# Patient Record
Sex: Male | Born: 1955 | Race: Black or African American | Hispanic: No | Smoking: Never smoker
Health system: Southern US, Community
[De-identification: ages and names within clinical notes are randomized; demographics above are authoritative.]

## PROBLEM LIST (undated history)

## (undated) ENCOUNTER — Emergency Department (HOSPITAL_COMMUNITY): Admission: EM | Payer: Self-pay | Source: Home / Self Care

## (undated) DIAGNOSIS — I10 Essential (primary) hypertension: Secondary | ICD-10-CM

## (undated) DIAGNOSIS — E119 Type 2 diabetes mellitus without complications: Secondary | ICD-10-CM

## (undated) DIAGNOSIS — I639 Cerebral infarction, unspecified: Secondary | ICD-10-CM

## (undated) HISTORY — PX: KNEE SURGERY: SHX244

## (undated) HISTORY — PX: EYE SURGERY: SHX253

## (undated) HISTORY — PX: TONSILLECTOMY: SUR1361

## (undated) HISTORY — PX: HAND SURGERY: SHX662

## (undated) HISTORY — PX: APPENDECTOMY: SHX54

---

## 1997-05-11 ENCOUNTER — Emergency Department (HOSPITAL_COMMUNITY): Admission: EM | Admit: 1997-05-11 | Discharge: 1997-05-11 | Payer: Self-pay | Admitting: Emergency Medicine

## 1997-07-25 ENCOUNTER — Emergency Department (HOSPITAL_COMMUNITY): Admission: EM | Admit: 1997-07-25 | Discharge: 1997-07-25 | Payer: Self-pay | Admitting: Emergency Medicine

## 1998-01-08 ENCOUNTER — Emergency Department (HOSPITAL_COMMUNITY): Admission: EM | Admit: 1998-01-08 | Discharge: 1998-01-08 | Payer: Self-pay | Admitting: Emergency Medicine

## 1998-03-02 ENCOUNTER — Emergency Department (HOSPITAL_COMMUNITY): Admission: EM | Admit: 1998-03-02 | Discharge: 1998-03-02 | Payer: Self-pay

## 1998-03-05 ENCOUNTER — Emergency Department (HOSPITAL_COMMUNITY): Admission: EM | Admit: 1998-03-05 | Discharge: 1998-03-05 | Payer: Self-pay | Admitting: Emergency Medicine

## 1998-03-15 ENCOUNTER — Emergency Department (HOSPITAL_COMMUNITY): Admission: EM | Admit: 1998-03-15 | Discharge: 1998-03-15 | Payer: Self-pay | Admitting: Emergency Medicine

## 1998-07-19 ENCOUNTER — Emergency Department (HOSPITAL_COMMUNITY): Admission: EM | Admit: 1998-07-19 | Discharge: 1998-07-19 | Payer: Self-pay | Admitting: Emergency Medicine

## 1998-08-04 ENCOUNTER — Emergency Department (HOSPITAL_COMMUNITY): Admission: EM | Admit: 1998-08-04 | Discharge: 1998-08-04 | Payer: Self-pay | Admitting: Emergency Medicine

## 1998-09-07 ENCOUNTER — Emergency Department (HOSPITAL_COMMUNITY): Admission: EM | Admit: 1998-09-07 | Discharge: 1998-09-07 | Payer: Self-pay | Admitting: Emergency Medicine

## 1998-09-09 ENCOUNTER — Emergency Department (HOSPITAL_COMMUNITY): Admission: EM | Admit: 1998-09-09 | Discharge: 1998-09-09 | Payer: Self-pay

## 1999-03-24 ENCOUNTER — Encounter: Payer: Self-pay | Admitting: Emergency Medicine

## 1999-03-24 ENCOUNTER — Emergency Department (HOSPITAL_COMMUNITY): Admission: EM | Admit: 1999-03-24 | Discharge: 1999-03-24 | Payer: Self-pay | Admitting: Emergency Medicine

## 1999-03-31 ENCOUNTER — Emergency Department (HOSPITAL_COMMUNITY): Admission: EM | Admit: 1999-03-31 | Discharge: 1999-03-31 | Payer: Self-pay | Admitting: Emergency Medicine

## 1999-05-15 ENCOUNTER — Emergency Department (HOSPITAL_COMMUNITY): Admission: EM | Admit: 1999-05-15 | Discharge: 1999-05-15 | Payer: Self-pay | Admitting: *Deleted

## 1999-09-11 ENCOUNTER — Emergency Department (HOSPITAL_COMMUNITY): Admission: EM | Admit: 1999-09-11 | Discharge: 1999-09-11 | Payer: Self-pay | Admitting: Emergency Medicine

## 1999-09-11 ENCOUNTER — Encounter: Payer: Self-pay | Admitting: Emergency Medicine

## 1999-09-16 ENCOUNTER — Emergency Department (HOSPITAL_COMMUNITY): Admission: EM | Admit: 1999-09-16 | Discharge: 1999-09-16 | Payer: Self-pay | Admitting: *Deleted

## 2000-06-25 ENCOUNTER — Emergency Department (HOSPITAL_COMMUNITY): Admission: EM | Admit: 2000-06-25 | Discharge: 2000-06-26 | Payer: Self-pay | Admitting: Emergency Medicine

## 2000-06-26 ENCOUNTER — Encounter: Payer: Self-pay | Admitting: Emergency Medicine

## 2000-08-27 ENCOUNTER — Encounter: Payer: Self-pay | Admitting: Emergency Medicine

## 2000-08-27 ENCOUNTER — Inpatient Hospital Stay (HOSPITAL_COMMUNITY): Admission: EM | Admit: 2000-08-27 | Discharge: 2000-08-31 | Payer: Self-pay | Admitting: Internal Medicine

## 2000-10-01 ENCOUNTER — Emergency Department (HOSPITAL_COMMUNITY): Admission: EM | Admit: 2000-10-01 | Discharge: 2000-10-01 | Payer: Self-pay | Admitting: Emergency Medicine

## 2000-10-01 ENCOUNTER — Encounter: Payer: Self-pay | Admitting: Emergency Medicine

## 2001-02-09 ENCOUNTER — Emergency Department (HOSPITAL_COMMUNITY): Admission: EM | Admit: 2001-02-09 | Discharge: 2001-02-09 | Payer: Self-pay

## 2001-05-28 ENCOUNTER — Emergency Department (HOSPITAL_COMMUNITY): Admission: EM | Admit: 2001-05-28 | Discharge: 2001-05-28 | Payer: Self-pay | Admitting: Emergency Medicine

## 2001-07-28 ENCOUNTER — Emergency Department (HOSPITAL_COMMUNITY): Admission: EM | Admit: 2001-07-28 | Discharge: 2001-07-28 | Payer: Self-pay | Admitting: Emergency Medicine

## 2002-06-16 ENCOUNTER — Emergency Department (HOSPITAL_COMMUNITY): Admission: EM | Admit: 2002-06-16 | Discharge: 2002-06-16 | Payer: Self-pay | Admitting: Emergency Medicine

## 2002-09-15 ENCOUNTER — Emergency Department (HOSPITAL_COMMUNITY): Admission: EM | Admit: 2002-09-15 | Discharge: 2002-09-15 | Payer: Self-pay | Admitting: Emergency Medicine

## 2003-04-25 ENCOUNTER — Emergency Department (HOSPITAL_COMMUNITY): Admission: EM | Admit: 2003-04-25 | Discharge: 2003-04-25 | Payer: Self-pay | Admitting: Emergency Medicine

## 2004-08-10 ENCOUNTER — Emergency Department (HOSPITAL_COMMUNITY): Admission: EM | Admit: 2004-08-10 | Discharge: 2004-08-10 | Payer: Self-pay | Admitting: Emergency Medicine

## 2004-09-25 ENCOUNTER — Emergency Department (HOSPITAL_COMMUNITY): Admission: EM | Admit: 2004-09-25 | Discharge: 2004-09-25 | Payer: Self-pay | Admitting: Emergency Medicine

## 2004-09-28 ENCOUNTER — Emergency Department (HOSPITAL_COMMUNITY): Admission: EM | Admit: 2004-09-28 | Discharge: 2004-09-28 | Payer: Self-pay | Admitting: Emergency Medicine

## 2004-10-08 ENCOUNTER — Emergency Department (HOSPITAL_COMMUNITY): Admission: EM | Admit: 2004-10-08 | Discharge: 2004-10-08 | Payer: Self-pay | Admitting: Emergency Medicine

## 2004-10-19 ENCOUNTER — Ambulatory Visit (HOSPITAL_COMMUNITY): Admission: RE | Admit: 2004-10-19 | Discharge: 2004-10-19 | Payer: Self-pay | Admitting: Orthopedic Surgery

## 2004-10-31 ENCOUNTER — Ambulatory Visit (HOSPITAL_BASED_OUTPATIENT_CLINIC_OR_DEPARTMENT_OTHER): Admission: RE | Admit: 2004-10-31 | Discharge: 2004-10-31 | Payer: Self-pay | Admitting: Orthopedic Surgery

## 2004-10-31 ENCOUNTER — Ambulatory Visit (HOSPITAL_COMMUNITY): Admission: RE | Admit: 2004-10-31 | Discharge: 2004-10-31 | Payer: Self-pay | Admitting: Orthopedic Surgery

## 2004-12-15 ENCOUNTER — Emergency Department (HOSPITAL_COMMUNITY): Admission: EM | Admit: 2004-12-15 | Discharge: 2004-12-15 | Payer: Self-pay | Admitting: Emergency Medicine

## 2005-06-02 ENCOUNTER — Emergency Department (HOSPITAL_COMMUNITY): Admission: EM | Admit: 2005-06-02 | Discharge: 2005-06-02 | Payer: Self-pay | Admitting: Emergency Medicine

## 2005-09-24 ENCOUNTER — Emergency Department (HOSPITAL_COMMUNITY): Admission: EM | Admit: 2005-09-24 | Discharge: 2005-09-24 | Payer: Self-pay | Admitting: Emergency Medicine

## 2007-11-06 ENCOUNTER — Emergency Department (HOSPITAL_COMMUNITY): Admission: EM | Admit: 2007-11-06 | Discharge: 2007-11-06 | Payer: Self-pay | Admitting: Emergency Medicine

## 2008-08-30 ENCOUNTER — Emergency Department (HOSPITAL_COMMUNITY): Admission: EM | Admit: 2008-08-30 | Discharge: 2008-08-30 | Payer: Self-pay | Admitting: Emergency Medicine

## 2008-09-13 ENCOUNTER — Emergency Department (HOSPITAL_COMMUNITY): Admission: EM | Admit: 2008-09-13 | Discharge: 2008-09-13 | Payer: Self-pay | Admitting: Emergency Medicine

## 2008-10-11 ENCOUNTER — Emergency Department (HOSPITAL_COMMUNITY): Admission: EM | Admit: 2008-10-11 | Discharge: 2008-10-11 | Payer: Self-pay | Admitting: Emergency Medicine

## 2009-07-11 ENCOUNTER — Emergency Department (HOSPITAL_COMMUNITY): Admission: EM | Admit: 2009-07-11 | Discharge: 2009-07-11 | Payer: Self-pay | Admitting: Emergency Medicine

## 2009-09-24 ENCOUNTER — Emergency Department (HOSPITAL_COMMUNITY): Admission: EM | Admit: 2009-09-24 | Discharge: 2009-09-24 | Payer: Self-pay | Admitting: Emergency Medicine

## 2009-12-16 ENCOUNTER — Emergency Department (HOSPITAL_COMMUNITY): Admission: EM | Admit: 2009-12-16 | Discharge: 2009-12-16 | Payer: Self-pay | Admitting: Emergency Medicine

## 2010-04-06 LAB — URINALYSIS, ROUTINE W REFLEX MICROSCOPIC
Bilirubin Urine: NEGATIVE
Glucose, UA: NEGATIVE mg/dL
Hgb urine dipstick: NEGATIVE
Ketones, ur: NEGATIVE mg/dL
Nitrite: NEGATIVE
Protein, ur: NEGATIVE mg/dL
Specific Gravity, Urine: 1.024 (ref 1.005–1.030)
Urobilinogen, UA: 0.2 mg/dL (ref 0.0–1.0)
pH: 6 (ref 5.0–8.0)

## 2010-04-07 LAB — URINE MICROSCOPIC-ADD ON

## 2010-04-07 LAB — URINALYSIS, ROUTINE W REFLEX MICROSCOPIC
Bilirubin Urine: NEGATIVE
Glucose, UA: NEGATIVE mg/dL
Hgb urine dipstick: NEGATIVE
Ketones, ur: NEGATIVE mg/dL
Nitrite: NEGATIVE
Protein, ur: NEGATIVE mg/dL
Specific Gravity, Urine: 1.02 (ref 1.005–1.030)
Urobilinogen, UA: 0.2 mg/dL (ref 0.0–1.0)
pH: 6.5 (ref 5.0–8.0)

## 2010-04-07 LAB — GC/CHLAMYDIA PROBE AMP, GENITAL
Chlamydia, DNA Probe: NEGATIVE
GC Probe Amp, Genital: NEGATIVE

## 2010-04-07 LAB — RPR: RPR Ser Ql: NONREACTIVE

## 2010-04-07 LAB — RAPID STREP SCREEN (MED CTR MEBANE ONLY): Streptococcus, Group A Screen (Direct): NEGATIVE

## 2010-04-11 LAB — DIFFERENTIAL
Basophils Absolute: 0 10*3/uL (ref 0.0–0.1)
Basophils Relative: 0 % (ref 0–1)
Eosinophils Absolute: 0.3 10*3/uL (ref 0.0–0.7)
Eosinophils Relative: 4 % (ref 0–5)
Lymphocytes Relative: 38 % (ref 12–46)
Lymphs Abs: 2.7 10*3/uL (ref 0.7–4.0)
Monocytes Absolute: 0.4 10*3/uL (ref 0.1–1.0)
Monocytes Relative: 5 % (ref 3–12)
Neutro Abs: 3.7 10*3/uL (ref 1.7–7.7)
Neutrophils Relative %: 52 % (ref 43–77)

## 2010-04-11 LAB — CBC
HCT: 37.9 % — ABNORMAL LOW (ref 39.0–52.0)
Hemoglobin: 12.5 g/dL — ABNORMAL LOW (ref 13.0–17.0)
MCHC: 32.8 g/dL (ref 30.0–36.0)
MCV: 88.8 fL (ref 78.0–100.0)
Platelets: 165 10*3/uL (ref 150–400)
RBC: 4.27 MIL/uL (ref 4.22–5.81)
RDW: 14.6 % (ref 11.5–15.5)
WBC: 7.1 10*3/uL (ref 4.0–10.5)

## 2010-04-11 LAB — URINALYSIS, ROUTINE W REFLEX MICROSCOPIC
Bilirubin Urine: NEGATIVE
Glucose, UA: NEGATIVE mg/dL
Hgb urine dipstick: NEGATIVE
Ketones, ur: NEGATIVE mg/dL
Nitrite: NEGATIVE
Protein, ur: NEGATIVE mg/dL
Specific Gravity, Urine: 1.015 (ref 1.005–1.030)
Urobilinogen, UA: 0.2 mg/dL (ref 0.0–1.0)
pH: 6 (ref 5.0–8.0)

## 2010-04-11 LAB — POCT I-STAT, CHEM 8
BUN: 13 mg/dL (ref 6–23)
Calcium, Ion: 1.22 mmol/L (ref 1.12–1.32)
Chloride: 108 mEq/L (ref 96–112)
Creatinine, Ser: 1.2 mg/dL (ref 0.4–1.5)
Glucose, Bld: 104 mg/dL — ABNORMAL HIGH (ref 70–99)
HCT: 40 % (ref 39.0–52.0)
Hemoglobin: 13.6 g/dL (ref 13.0–17.0)
Potassium: 3.7 mEq/L (ref 3.5–5.1)
Sodium: 142 mEq/L (ref 135–145)
TCO2: 25 mmol/L (ref 0–100)

## 2010-04-11 LAB — HEMOCCULT GUIAC POC 1CARD (OFFICE): Fecal Occult Bld: NEGATIVE

## 2010-04-29 LAB — URINALYSIS, ROUTINE W REFLEX MICROSCOPIC
Bilirubin Urine: NEGATIVE
Glucose, UA: NEGATIVE mg/dL
Hgb urine dipstick: NEGATIVE
Ketones, ur: NEGATIVE mg/dL
Nitrite: NEGATIVE
Protein, ur: NEGATIVE mg/dL
Specific Gravity, Urine: 1.029 (ref 1.005–1.030)
Urobilinogen, UA: 1 mg/dL (ref 0.0–1.0)
pH: 5.5 (ref 5.0–8.0)

## 2010-04-29 LAB — URINE MICROSCOPIC-ADD ON

## 2010-04-29 LAB — GC/CHLAMYDIA PROBE AMP, GENITAL
Chlamydia, DNA Probe: NEGATIVE
GC Probe Amp, Genital: NEGATIVE

## 2010-04-29 LAB — URINE CULTURE: Colony Count: 10000

## 2010-04-30 LAB — DIFFERENTIAL
Basophils Absolute: 0 10*3/uL (ref 0.0–0.1)
Basophils Relative: 0 % (ref 0–1)
Eosinophils Absolute: 0.2 10*3/uL (ref 0.0–0.7)
Eosinophils Relative: 4 % (ref 0–5)
Lymphocytes Relative: 34 % (ref 12–46)
Lymphs Abs: 2.2 10*3/uL (ref 0.7–4.0)
Monocytes Absolute: 0.3 10*3/uL (ref 0.1–1.0)
Monocytes Relative: 5 % (ref 3–12)
Neutro Abs: 3.7 10*3/uL (ref 1.7–7.7)
Neutrophils Relative %: 57 % (ref 43–77)

## 2010-04-30 LAB — POCT I-STAT, CHEM 8
BUN: 17 mg/dL (ref 6–23)
Calcium, Ion: 1.24 mmol/L (ref 1.12–1.32)
Chloride: 109 mEq/L (ref 96–112)
Creatinine, Ser: 1.2 mg/dL (ref 0.4–1.5)
Glucose, Bld: 97 mg/dL (ref 70–99)
HCT: 42 % (ref 39.0–52.0)
Hemoglobin: 14.3 g/dL (ref 13.0–17.0)
Potassium: 4.1 mEq/L (ref 3.5–5.1)
Sodium: 140 mEq/L (ref 135–145)
TCO2: 24 mmol/L (ref 0–100)

## 2010-04-30 LAB — URINALYSIS, ROUTINE W REFLEX MICROSCOPIC
Bilirubin Urine: NEGATIVE
Glucose, UA: NEGATIVE mg/dL
Hgb urine dipstick: NEGATIVE
Ketones, ur: NEGATIVE mg/dL
Nitrite: NEGATIVE
Protein, ur: NEGATIVE mg/dL
Specific Gravity, Urine: 1.025 (ref 1.005–1.030)
Urobilinogen, UA: 0.2 mg/dL (ref 0.0–1.0)
pH: 5.5 (ref 5.0–8.0)

## 2010-04-30 LAB — GLUCOSE, CAPILLARY: Glucose-Capillary: 93 mg/dL (ref 70–99)

## 2010-04-30 LAB — POCT CARDIAC MARKERS
CKMB, poc: <1 ng/mL — ABNORMAL LOW (ref 1.0–8.0)
Myoglobin, poc: 66.2 ng/mL (ref 12–200)
Troponin i, poc: <0.05 ng/mL (ref 0.00–0.09)

## 2010-04-30 LAB — CBC
HCT: 39.2 % (ref 39.0–52.0)
Hemoglobin: 13.2 g/dL (ref 13.0–17.0)
MCHC: 33.7 g/dL (ref 30.0–36.0)
MCV: 88.5 fL (ref 78.0–100.0)
Platelets: 177 10*3/uL (ref 150–400)
RBC: 4.42 MIL/uL (ref 4.22–5.81)
RDW: 14.2 % (ref 11.5–15.5)
WBC: 6.4 10*3/uL (ref 4.0–10.5)

## 2010-05-22 ENCOUNTER — Other Ambulatory Visit: Payer: Self-pay | Admitting: Emergency Medicine

## 2010-05-22 ENCOUNTER — Emergency Department (HOSPITAL_COMMUNITY)
Admission: EM | Admit: 2010-05-22 | Discharge: 2010-05-22 | Disposition: A | Discharge status: Home / Self Care | Payer: Self-pay | Attending: Emergency Medicine | Admitting: Emergency Medicine

## 2010-05-22 DIAGNOSIS — N390 Urinary tract infection, site not specified: Secondary | ICD-10-CM | POA: Insufficient documentation

## 2010-05-22 DIAGNOSIS — R109 Unspecified abdominal pain: Secondary | ICD-10-CM | POA: Insufficient documentation

## 2010-05-23 LAB — URINALYSIS, ROUTINE W REFLEX MICROSCOPIC
Bilirubin Urine: NEGATIVE
Glucose, UA: NEGATIVE mg/dL
Hgb urine dipstick: NEGATIVE
Ketones, ur: NEGATIVE mg/dL
Nitrite: POSITIVE — AB
Protein, ur: NEGATIVE mg/dL
Specific Gravity, Urine: 1.02 (ref 1.005–1.030)
Urobilinogen, UA: 1 mg/dL (ref 0.0–1.0)
pH: 6 (ref 5.0–8.0)

## 2010-05-23 LAB — URINE MICROSCOPIC-ADD ON

## 2010-05-23 LAB — GC/CHLAMYDIA PROBE AMP, GENITAL
Chlamydia, DNA Probe: UNDETERMINED
GC Probe Amp, Genital: NEGATIVE

## 2010-10-25 LAB — POCT I-STAT, CHEM 8
BUN: 15
Calcium, Ion: 1.26
Chloride: 108
Creatinine, Ser: 1.2
Glucose, Bld: 100 — ABNORMAL HIGH
HCT: 40
Hemoglobin: 13.6
Potassium: 3.8
Sodium: 143
TCO2: 27

## 2010-10-25 LAB — URINALYSIS, ROUTINE W REFLEX MICROSCOPIC
Bilirubin Urine: NEGATIVE
Glucose, UA: NEGATIVE
Hgb urine dipstick: NEGATIVE
Ketones, ur: NEGATIVE
Nitrite: NEGATIVE
Protein, ur: NEGATIVE
Specific Gravity, Urine: 1.029
Urobilinogen, UA: 1
pH: 6

## 2010-10-25 LAB — URINE MICROSCOPIC-ADD ON

## 2010-10-25 LAB — GC/CHLAMYDIA PROBE AMP, GENITAL
Chlamydia, DNA Probe: NEGATIVE
GC Probe Amp, Genital: NEGATIVE

## 2010-10-25 LAB — RPR: RPR Ser Ql: NONREACTIVE

## 2011-04-26 ENCOUNTER — Emergency Department (HOSPITAL_COMMUNITY): Payer: Self-pay

## 2011-04-26 ENCOUNTER — Encounter (HOSPITAL_COMMUNITY): Payer: Self-pay | Admitting: *Deleted

## 2011-04-26 ENCOUNTER — Emergency Department (HOSPITAL_COMMUNITY)
Admission: EM | Admit: 2011-04-26 | Discharge: 2011-04-26 | Disposition: A | Discharge status: Home / Self Care | Payer: Self-pay | Attending: Emergency Medicine | Admitting: Emergency Medicine

## 2011-04-26 DIAGNOSIS — S43409A Unspecified sprain of unspecified shoulder joint, initial encounter: Secondary | ICD-10-CM

## 2011-04-26 DIAGNOSIS — X500XXA Overexertion from strenuous movement or load, initial encounter: Secondary | ICD-10-CM | POA: Insufficient documentation

## 2011-04-26 DIAGNOSIS — M25419 Effusion, unspecified shoulder: Secondary | ICD-10-CM | POA: Insufficient documentation

## 2011-04-26 DIAGNOSIS — M25519 Pain in unspecified shoulder: Secondary | ICD-10-CM | POA: Insufficient documentation

## 2011-04-26 DIAGNOSIS — IMO0002 Reserved for concepts with insufficient information to code with codable children: Secondary | ICD-10-CM | POA: Insufficient documentation

## 2011-04-26 MED ORDER — HYDROCODONE-ACETAMINOPHEN 5-500 MG PO TABS: 1.0000 | ORAL_TABLET | Freq: Four times a day (QID) | ORAL | Status: AC | PRN

## 2011-04-26 MED ORDER — IBUPROFEN 600 MG PO TABS: 600.0000 mg | ORAL_TABLET | Freq: Four times a day (QID) | ORAL | Status: AC | PRN

## 2011-04-26 NOTE — ED Notes (Signed)
Pt with swelling and limited movement to left shoulder

## 2011-04-26 NOTE — Discharge Instructions (Signed)
Your x-ray is normal today. I think you have a muscular injury. Ice your shoulder several times a day. No lifting over 1lb with that arm. Sling for comfort. Ibuprofen for pain. Vicodin for severe pain. Follow up with orthopedics. Follow up with primary care doctor as well regarding  Your blood pressure.   Joint Sprain A sprain is a tear or stretch in the ligaments that hold a joint together. Severe sprains may need as long as 3-6 weeks of immobilization and/or exercises to heal completely. Sprained joints should be rested and protected. If not, they can become unstable and prone to re-injury. Proper treatment can reduce your pain, shorten the period of disability, and reduce the risk of repeated injuries. TREATMENT   Rest and elevate the injured joint to reduce pain and swelling.   Apply ice packs to the injury for 20-30 minutes every 2-3 hours for the next 2-3 days.   Keep the injury wrapped in a compression bandage or splint as long as the joint is painful or as instructed by your caregiver.   Do not use the injured joint until it is completely healed to prevent re-injury and chronic instability. Follow the instructions of your caregiver.   Long-term sprain management may require exercises and/or treatment by a physical therapist. Taping or special braces may help stabilize the joint until it is completely better.  SEEK MEDICAL CARE IF:   You develop increased pain or swelling of the joint.   You develop increasing redness and warmth of the joint.   You develop a fever.   It becomes stiff.   Your hand or foot gets cold or numb.  Document Released: 02/17/2004 Document Revised: 12/29/2010 Document Reviewed: 01/27/2008 [facility] Patient Information 2012 [place], [place].
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
## 2011-04-28 NOTE — ED Provider Notes (Signed)
Medical screening examination/treatment/procedure(s) were performed by non-physician practitioner and as supervising physician I was immediately available for consultation/collaboration.   Dayton Bailiff, MD 04/28/11 [phone]

## 2011-07-09 ENCOUNTER — Encounter (HOSPITAL_COMMUNITY): Payer: Self-pay | Admitting: Family Medicine

## 2011-07-09 ENCOUNTER — Emergency Department (HOSPITAL_COMMUNITY)
Admission: EM | Admit: 2011-07-09 | Discharge: 2011-07-09 | Disposition: A | Discharge status: Home / Self Care | Payer: Self-pay | Attending: Emergency Medicine | Admitting: Emergency Medicine

## 2011-07-09 DIAGNOSIS — M25419 Effusion, unspecified shoulder: Secondary | ICD-10-CM | POA: Insufficient documentation

## 2011-07-09 DIAGNOSIS — M259 Joint disorder, unspecified: Secondary | ICD-10-CM | POA: Insufficient documentation

## 2011-07-09 DIAGNOSIS — M25519 Pain in unspecified shoulder: Secondary | ICD-10-CM | POA: Insufficient documentation

## 2011-07-09 DIAGNOSIS — R2230 Localized swelling, mass and lump, unspecified upper limb: Secondary | ICD-10-CM

## 2011-07-09 NOTE — Discharge Instructions (Signed)
It is important for you to follow up with Dr. Berton Lan with Orthopedics for further evaluation of your shoulder.  He stated that he would be able to see you in the office on Tuesday.  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  However, it is important for you to have further evaluation to determine what the mass is.  Return to the Emergency Department if you develop fevers, redness of the skin, or warmth of the skin.

## 2011-07-09 NOTE — ED Notes (Signed)
Pt sts left shoulder pain x months.
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
## 2011-07-10 NOTE — ED Provider Notes (Signed)
Medical screening examination/treatment/procedure(s) were conducted as a shared visit with non-physician practitioner(s) and myself.  I personally evaluated the patient during the encounter  Pt with gradual swelling and mild pain worse with palpation and certain movements actively of left shoulder since around April.  No fever, no skin discoloration.  No distal numbness or weakness.  Soft tissue swelling to anterior left shoulder.  Not indurated, questionable fluctuance.  Attempted needle aspiration without improvement and no fluid obtained.  Bedside U/S by me, images NOT archived, showed no obv fluid collection.  Clinical h/o doesn't suggest abscess.  However since likely tissue and continues to enlarge, will try to arrange outpt eval and possibly biopsy.       Apiration of blood/fluid Performed by: Lear Ng Consent obtained. Required items: required blood products, implants, devices, and special equipment available Patient identity confirmed: verbally with patient Time out: Immediately prior to procedure a "time out" was called to verify the correct patient, procedure, equipment, support staff and site/side marked as required. Preparation: Patient was prepped and draped in the usual sterile fashion. Patient tolerance: Patient tolerated the procedure well with no immediate complications.  No fluid obtained.  18 gauge needle used in procedure.  Location of aspiration: left shoulder   Gavin Pound. Dameion Briles, MD 07/10/11 1535

## 2011-07-12 ENCOUNTER — Emergency Department (HOSPITAL_COMMUNITY)
Admission: EM | Admit: 2011-07-12 | Discharge: 2011-07-13 | Discharge status: Against Medical Advice | Payer: Self-pay | Attending: Emergency Medicine | Admitting: Emergency Medicine

## 2011-07-12 ENCOUNTER — Encounter (HOSPITAL_COMMUNITY): Payer: Self-pay | Admitting: Family Medicine

## 2011-07-12 DIAGNOSIS — I10 Essential (primary) hypertension: Secondary | ICD-10-CM | POA: Insufficient documentation

## 2011-07-12 NOTE — ED Notes (Signed)
Patient states he was seen at [facility] for a mass on his left shoulder yesterday. At that visit was found to be hypertensive with BP 178/118. Orthopedic doc told patient to go to ER for evaluation and to start meds.

## 2011-07-12 NOTE — ED Notes (Signed)
Pt states that he no longer wants to be seen because he is not hurting and that his blood

## 2011-07-12 NOTE — ED Notes (Signed)
(  note continued) states that is not hurting and is blood pressure is up. Pt made aware of possible complications if patient leaves and is aware that he can come back to be seen if he feels worse. Pt acknowledged and left.

## 2011-07-13 ENCOUNTER — Emergency Department (HOSPITAL_COMMUNITY)
Admission: EM | Admit: 2011-07-13 | Discharge: 2011-07-13 | Disposition: A | Discharge status: Home / Self Care | Payer: Self-pay | Attending: Emergency Medicine | Admitting: Emergency Medicine

## 2011-07-13 ENCOUNTER — Encounter (HOSPITAL_COMMUNITY): Payer: Self-pay | Admitting: *Deleted

## 2011-07-13 DIAGNOSIS — L219 Seborrheic dermatitis, unspecified: Secondary | ICD-10-CM

## 2011-07-13 DIAGNOSIS — I10 Essential (primary) hypertension: Secondary | ICD-10-CM

## 2011-07-13 DIAGNOSIS — L218 Other seborrheic dermatitis: Secondary | ICD-10-CM | POA: Insufficient documentation

## 2011-07-13 MED ORDER — LISINOPRIL-HYDROCHLOROTHIAZIDE 10-12.5 MG PO TABS: 1.0000 | ORAL_TABLET | Freq: Every day | ORAL | Status: DC

## 2011-07-13 MED ORDER — KETOCONAZOLE 2 % EX FOAM: CUTANEOUS | Status: DC

## 2011-07-13 NOTE — ED Provider Notes (Signed)
History     CSN: [id number]  Arrival date & time 07/13/11  1244   First MD Initiated Contact with Patient 07/13/11 1410      Chief Complaint  Patient presents with  . Hypertension    (Consider location/radiation/quality/duration/timing/severity/associated sxs/prior treatment) Patient is a 56 y.o. male presenting with hypertension. The history is provided by the patient.  Hypertension This is a new problem. Pertinent negatives include no chest pain, chills, fatigue or headaches.  Pt states he went to see an orthopedist and he was told his blood pressure was very high. States he needs to have a shoulder surgery, but they wont do it until his bp is under control. Pt states he has no PCP. Has never taken BP in the past. He currently has no complaints. Denies headache, nausea, vomiting, chest pain, SOB, swelling.   History reviewed. No pertinent past medical history.  Past Surgical History  Procedure Date  . Knee surgery   . Tonsillectomy   . Appendectomy   . Hand surgery     No family history on file.  History  Substance Use Topics  . Smoking status: Never Smoker   . Smokeless tobacco: Not on file  . Alcohol Use: Yes      Review of Systems  Constitutional: Negative for chills and fatigue.  Eyes: Negative for pain.  Respiratory: Negative.  Negative for chest tightness and shortness of breath.   Cardiovascular: Negative.  Negative for chest pain and leg swelling.  Gastrointestinal: Negative.   Genitourinary: Negative.   Musculoskeletal: Negative.   Skin: Negative.   Neurological: Negative for dizziness, light-headedness and headaches.    Allergies  Review of patient's allergies indicates no known allergies.  Home Medications  No current outpatient prescriptions on file.  BP 163/93  Pulse 65  Temp 98.7 F (37.1 C) (Oral)  Resp 18  SpO2 99%  Physical Exam  Nursing note and vitals reviewed. Constitutional: He is oriented to person, place, and time. He  appears well-developed and well-nourished. No distress.  HENT:  Head: Normocephalic.  Eyes: Conjunctivae are normal.  Neck: Neck supple.  Cardiovascular: Normal rate, regular rhythm and normal heart sounds.   Pulmonary/Chest: Effort normal and breath sounds normal. No respiratory distress. He has no wheezes. He has no rales.  Musculoskeletal: He exhibits no edema.  Neurological: He is alert and oriented to person, place, and time.  Skin: Skin is warm and dry.       Dry, erythemous, scaly rash top's top lip over his mustage  Psychiatric: He has a normal mood and affect.    ED Course  Procedures (including critical care time)  Pt with htn, no other complains. States just wants some BP medications. Pt also has some rash to the upper lip, consistent with seborrheic dermatitis. Will prescribe cream for treatment. Will start on medications. Discussed at length importance of following up with PCP. Will give resources.    1. HTN (hypertension)   2. Seborrheic dermatitis       MDM          Lottie Mussel, PA 07/13/11 1523

## 2011-07-13 NOTE — ED Notes (Signed)
Pt eating chips while waiting in waiting room.

## 2011-07-13 NOTE — ED Notes (Signed)
Pt states he is just her to have his bp re check. Pt states he was here last night and thinks he needs to be on bp medication. Pt denies any other symptoms

## 2011-07-13 NOTE — ED Provider Notes (Signed)
Medical screening examination/treatment/procedure(s) were performed by non-physician practitioner and as supervising physician I was immediately available for consultation/collaboration.   Kalika Smay A Shelbia Scinto, MD 07/13/11 1618 

## 2011-07-13 NOTE — Discharge Instructions (Signed)
Apply the ketoconazole cream to the lip twice a day for 4 weeks. Take new blood pressure medication as prescribed daily. Follow up with primary care doctor for recheck and further treatment.  RESOURCE GUIDE  Chronic Pain Problems: Contact [facility]  [phone] Patients need to be referred by their primary care doctor.  Insufficient Money for Medicine: Contact United Way:  call "211" or [facility] [phone].  No Primary Care Doctor: - Call Health Connect  [phone] - can help you locate a primary care doctor that  accepts your insurance, provides certain services, etc. - Physician Referral Service- [phone]  Agencies that provide inexpensive medical care: - [facility]  [phone] - [facility]  [phone] - [facility]  [phone] - Women's Clinic  [phone] - Planned Parenthood  [phone] [facility]  [phone]  Medicaid-accepting [facility] Providers: - [facility]- [address]  [phone], Mon-Fri 9am-7pm, Sat 9am-1pm - [facility]- [address] [place], [address] [place]  [phone] - [facility]- [address] [place]  [phone] [facility]- [address]  [phone] - Renaye Rakers- [address] [place], [address], [phone]  Only accepts [place] patients after they have their name  applied to their card  Self Pay (no insurance) in [place]: - Sickle Cell Patients: Dr Willey Blade, [facility]  [address] [place], [phone] - [facility] [place] [facility]- [address] [place]  [phone]       [facility]- [address]       -     [facility]- see information above (Speak to [employer] if you do not have insurance)       -  [facility]- [address] [place], [phone]       -  [facility]- [address],  [phone]       -  [facility]- [address], [phone]       -  Dr Julio Sicks-  [address], [place], [phone]       -  [facility]- [address], [phone]       -  [facility]- [address] [phone], also [address] [phone]       -    [facility]- [address] [place], [phone], 1st & 3rd Saturday   every month, 10am-1pm  1) Find a Doctor and Pay Out of Pocket Although you won't have to find out who is covered by your insurance plan, it is a good idea to ask around and get recommendations. You will then need to call the office and see if the doctor you have chosen will accept you as a new patient and what types of options they offer for patients who are self-pay. Some doctors offer discounts or will set up payment plans for their patients who do not have insurance, but you will need to ask so you aren't surprised when you get to your appointment.  2) Contact Your Local Health Department Not all health departments have doctors that can see patients for sick visits, but many do, so it is worth a call to see if yours does. If you don't know  where your local health department is, you can check in your phone book. The CDC also has a tool to help you locate your state's health department, and many state websites also have listings of all of their local health departments.  3) Find a Walk-in Clinic If your illness is not likely to be very severe or complicated, you may want to try a walk in clinic. These are popping up all over the country in pharmacies, drugstores, and shopping centers. They're usually staffed by nurse practitioners or physician assistants that have been trained to treat common illnesses and complaints. They're usually fairly quick and inexpensive. However, if you have serious medical issues or chronic medical problems, these are probably not your best option  STD Testing - [facility] Department of [facility], STD Clinic, [address], [place], phone [phone] or [phone].  Monday - Friday, call for an appointment. [facility] Department of [employer], STD Clinic, [address], [place], phone [phone] or [phone].  Monday - Friday, call for an appointment.  Abuse/Neglect: [facility] Child Abuse Hotline 660-781-6428 [facility] Child Abuse Hotline [phone]) [phone] (After Hours)  Emergency Shelter:  [facility] 819-046-9227  Maternity Homes: - [facility] [place] [facility] (707) [phone] - [facility] 6600451393  MRSA Hotline #:   828-157-8262  [facility] Resources  [facility] [place]  United Way [facility] Dept. [address]                 [address]         [address] [place] [address]  Blondell Reveal Phone:  [phone]                                  Phone:  [phone]                   Phone:  [phone]  [facility] Mental Health, [phone] - [facility] - [phone]       -     [facility] in [place], [address],                                  [phone], [facility] Child Abuse Hotline 606-019-9298 or ([phone]) [phone] (After Hours)   Behavioral Health Services  Substance Abuse Resources: - [facility]  614-034-9122 - [facility] [phone] - [facility] [phone] Floydene Flock (303)[phone] - Residential & Outpatient Substance Abuse Program  [phone]  Psychological Services: [facility]  ([phone] Services  [phone] - [facility], [phone] [place]. [address], [place], ACCESS LINE: (667) 887-2182 or [phone],  EntrepreneurLoan.co.za  Dental Assistance  If unable to pay or uninsured, contact:  [facility] or [address]  [facility]. to become qualified for the adult dental clinic.  Patients with Medicaid: [facility] [phone] [address], [phone] [address], [phone]  If unable to pay, or uninsured, contact [facility] [phone]) or [facility] Department [phone] in [place], [phone] in [facility]) to become qualified for the adult dental clinic  Other Low-Cost Community Dental Services: - [facility]- [address] [place], [place], [place], [address], [phone], [phone], 2nd and 4th Thursday of the month at 6:30am.  10 clients each day by appointment, can sometimes see walk-in patients if someone does not show for an appointment. [facility]- [address] [place], [place], [address], [phone] - [phone]- [address], [place], [place], [address], [phone] [facility] Health Department- [phone] [facility] Health Department- [phone] [facility] [phone]    Hypertension Information As your heart beats, it forces blood through your arteries. This force is your blood pressure. If the pressure is too high, it is called hypertension (HTN) or high blood pressure. HTN is dangerous because you may have it and not know it. High blood pressure may mean that your heart has to work harder to pump blood. Your arteries may be narrow or stiff. The extra work puts you at risk for heart disease, stroke, and other problems.  Blood pressure consists of two numbers, a higher number over a lower, 110/72, for example. It is stated as "110 over 72." The ideal is below 120 for the top number (systolic) and under 80 for the bottom (diastolic).  You should pay close attention to your blood pressure if you have certain conditions such as:  Heart failure.   Prior  heart attack.   Diabetes   Chronic kidney disease.   Prior stroke.   Multiple risk factors for heart disease.  To see if you have HTN, your blood pressure should be measured while you are seated with your arm held at the level of the heart. It should be measured at least twice. A one-time elevated blood pressure reading (especially in the Emergency Department) does not mean that you need treatment. There may be conditions in which the blood pressure is different between your right and left arms. It is important to see your caregiver soon for a recheck. Most people have essential hypertension which means that there is not a specific cause. This type of high blood pressure may be lowered by changing lifestyle factors such as:  Stress.   Smoking.   Lack of exercise.   Excessive weight.   Drug/tobacco/alcohol use.   Eating less salt.  Most people do not have symptoms from high blood pressure until it has caused damage to the body. Effective treatment can often prevent, delay or reduce that damage. TREATMENT  Treatment for high blood pressure, when a cause has been identified, is directed at the cause. There are a large number of medications to treat HTN. These fall into several categories, and your caregiver will help you select the medicines that are best for you. Medications may have side effects. You should review side effects with your caregiver. If your blood pressure stays high after you have made lifestyle changes or started on medicines,   Your medication(s) may need to be changed.   Other problems may need to be addressed.   Be certain you understand your prescriptions, and know how and when to take your medicine.   Be sure to follow  up with your caregiver within the time frame advised (usually within two weeks) to have your blood pressure rechecked and to review your medications.   If you are taking more than one medicine to lower your blood pressure, make sure you know how  and at what times they should be taken. Taking two medicines at the same time can result in blood pressure that is too low.  Document Released: 03/14/2005 Document Revised: 09/21/2010 Document Reviewed: 03/21/2007 [facility] Patient Information 2012 [place], [place].

## 2011-07-17 ENCOUNTER — Other Ambulatory Visit (HOSPITAL_COMMUNITY): Payer: Self-pay | Admitting: Orthopedic Surgery

## 2011-07-17 DIAGNOSIS — M25512 Pain in left shoulder: Secondary | ICD-10-CM

## 2011-07-20 ENCOUNTER — Ambulatory Visit (HOSPITAL_COMMUNITY)
Admission: RE | Admit: 2011-07-20 | Discharge: 2011-07-20 | Disposition: A | Discharge status: Home / Self Care | Payer: Self-pay | Source: Ambulatory Visit | Attending: Orthopedic Surgery | Admitting: Orthopedic Surgery

## 2011-07-20 DIAGNOSIS — X58XXXA Exposure to other specified factors, initial encounter: Secondary | ICD-10-CM | POA: Insufficient documentation

## 2011-07-20 DIAGNOSIS — M67919 Unspecified disorder of synovium and tendon, unspecified shoulder: Secondary | ICD-10-CM | POA: Insufficient documentation

## 2011-07-20 DIAGNOSIS — D1779 Benign lipomatous neoplasm of other sites: Secondary | ICD-10-CM | POA: Insufficient documentation

## 2011-07-20 DIAGNOSIS — M719 Bursopathy, unspecified: Secondary | ICD-10-CM | POA: Insufficient documentation

## 2011-07-20 DIAGNOSIS — M25512 Pain in left shoulder: Secondary | ICD-10-CM

## 2011-07-20 DIAGNOSIS — S43429A Sprain of unspecified rotator cuff capsule, initial encounter: Secondary | ICD-10-CM | POA: Insufficient documentation

## 2011-07-20 MED ORDER — GADOBENATE DIMEGLUMINE 529 MG/ML IV SOLN
19.0000 mL | Freq: Once | INTRAVENOUS | Status: AC | PRN
  Administered 2011-07-20: 19 mL via INTRAVENOUS

## 2011-07-30 ENCOUNTER — Encounter (HOSPITAL_COMMUNITY): Payer: Self-pay | Admitting: *Deleted

## 2011-07-30 ENCOUNTER — Emergency Department (HOSPITAL_COMMUNITY)
Admission: EM | Admit: 2011-07-30 | Discharge: 2011-07-30 | Disposition: A | Discharge status: Home / Self Care | Payer: Self-pay | Attending: Emergency Medicine | Admitting: Emergency Medicine

## 2011-07-30 DIAGNOSIS — S40019A Contusion of unspecified shoulder, initial encounter: Secondary | ICD-10-CM | POA: Insufficient documentation

## 2011-07-30 DIAGNOSIS — D1739 Benign lipomatous neoplasm of skin and subcutaneous tissue of other sites: Secondary | ICD-10-CM | POA: Insufficient documentation

## 2011-07-30 DIAGNOSIS — W503XXA Accidental bite by another person, initial encounter: Secondary | ICD-10-CM | POA: Insufficient documentation

## 2011-07-30 DIAGNOSIS — D1720 Benign lipomatous neoplasm of skin and subcutaneous tissue of unspecified limb: Secondary | ICD-10-CM

## 2011-07-30 DIAGNOSIS — Z23 Encounter for immunization: Secondary | ICD-10-CM | POA: Insufficient documentation

## 2011-07-30 MED ORDER — TETANUS-DIPHTH-ACELL PERTUSSIS 5-2.5-18.5 LF-MCG/0.5 IM SUSP
0.5000 mL | Freq: Once | INTRAMUSCULAR | Status: AC
  Administered 2011-07-30: 0.5 mL via INTRAMUSCULAR
  Filled 2011-07-30: qty 0.5

## 2011-07-30 NOTE — Discharge Instructions (Signed)
Take Naproxen as needed for pain.  Contusion A contusion is a deep bruise. Contusions are the result of an injury that caused bleeding under the skin. The contusion may turn blue, purple, or yellow. Minor injuries will give you a painless contusion, but more severe contusions may stay painful and swollen for a few weeks.  CAUSES  A contusion is usually caused by a blow, trauma, or direct force to an area of the body. SYMPTOMS   Swelling and redness of the injured area.   Bruising of the injured area.   Tenderness and soreness of the injured area.   Pain.  DIAGNOSIS  The diagnosis can be made by taking a history and physical exam. An X-ray, CT scan, or MRI may be needed to determine if there were any associated injuries, such as fractures. TREATMENT  Specific treatment will depend on what area of the body was injured. In general, the best treatment for a contusion is resting, icing, elevating, and applying cold compresses to the injured area. Over-the-counter medicines may also be recommended for pain control. Ask your caregiver what the best treatment is for your contusion. HOME CARE INSTRUCTIONS   Put ice on the injured area.   Put ice in a plastic bag.   Place a towel between your skin and the bag.   Leave the ice on for 15 to 20 minutes, 3 to 4 times a day.   Only take over-the-counter or prescription medicines for pain, discomfort, or fever as directed by your caregiver. Your caregiver may recommend avoiding anti-inflammatory medicines (aspirin, ibuprofen, and naproxen) for 48 hours because these medicines may increase bruising.   Rest the injured area.   If possible, elevate the injured area to reduce swelling.  SEEK IMMEDIATE MEDICAL CARE IF:   You have increased bruising or swelling.   You have pain that is getting worse.   Your swelling or pain is not relieved with medicines.  MAKE SURE YOU:   Understand these instructions.   Will watch your condition.   Will  get help right away if you are not doing well or get worse.  Document Released: 10/19/2004 Document Revised: 12/29/2010 Document Reviewed: 11/14/2010 [facility] Patient Information 2012 [place], [place].  Tetanus, Diphtheria (Td) or Tetanus, Diphtheria, Pertussis (Tdap) Vaccine What You Need to Know WHY GET VACCINATED? Tetanus, diphtheria and pertussis can be very serious diseases. TETANUS (Lockjaw) causes painful muscle spasms and stiffness, usually all over the body. Tetanus can lead to tightening of muscles in the head and neck so the victim cannot open his mouth or swallow, or sometimes even breathe. Tetanus kills about 1 out of 5 people who are infected.  DIPHTHERIA can cause a thick membrane to cover the back of the throat. Diphtheria can lead to breathing problems, paralysis, heart failure, and even death.  PERTUSSIS (Whooping Cough) causes severe coughing spells which can lead to difficulty breathing, vomiting, and disturbed sleep. Pertussis can lead to weight loss, incontinence, rib fractures and passing out from violent coughing. Up to 2 in 100 adolescents and 5 in 100 adults with pertussis are hospitalized or have complications, including pneumonia and death.  These 3 diseases are all caused by bacteria. Diphtheria and pertussis are spread from person to person. Tetanus enters the body through cuts, scratches, or wounds. The [place] saw as many as 200,000 cases a year of diphtheria and pertussis before vaccines were available, and hundreds of cases of tetanus. Since then, tetanus and diphtheria cases have dropped by about 99% and  pertussis cases by about 92%. Children 66 years of age and younger get DTaP vaccine to protect them from these three diseases. But older children, adolescents, and adults need protection too. VACCINES FOR ADOLESCENTS AND ADULTS: TD AND TDAP Two vaccines are available to protect people 31 years of age and older from these diseases: Td vaccine has been used for  many years. It protects against tetanus and diphtheria.  Tdap vaccine was licensed in 2005. It is the first vaccine for adolescents and adults that protects against pertussis as well as tetanus and diphtheria.  A Td booster dose is recommended every 10 years. Tdap is given only once. WHICH VACCINE, AND WHEN? Ages 7 through 18 years A dose of Tdap is recommended at age 52 or 40. This dose could be given as early as age 5 for children who missed one or more childhood doses of DTaP.  Children and adolescents who did not get a complete series of DTaP shots by age 12 should complete the series using a combination of Td and Tdap.  Age 41 years and Older All adults should get a booster dose of Td every 10 years. Adults under 65 who have never gotten Tdap should get a dose of Tdap as their next booster dose. Adults 65 and older may get one booster dose of Tdap.  Adults (including women who may become pregnant and adults 75 and older) who expect to have close contact with a baby younger than 8 months of age should get a dose of Tdap to help protect the baby from pertussis.  Healthcare professionals who have direct patient contact in hospitals or clinics should get one dose of Tdap.  Protection After a Wound A person who gets a severe cut or burn might need a dose of Td or Tdap to prevent tetanus infection. Tdap should be used for anyone who has never had a dose previously. Td should be used if Tdap is not available, or for:  Anybody who has already had a dose of Tdap.  Children 7 through 71 years of age who completed the childhood DTaP series.  Adults 65 and older.  Pregnant Women Pregnant women who have never had a dose of Tdap should get one, after the 20th week of gestation and preferably during the 3rd trimester. If they do not get Tdap during their pregnancy they should get a dose as soon as possible after delivery. Pregnant women who have previously received Tdap and need tetanus or diphtheria vaccine  while pregnant should get Td.  Tdap and Td may be given at the same time as other vaccines. SOME PEOPLE SHOULD NOT BE VACCINATED OR SHOULD WAIT Anyone who has had a life-threatening allergic reaction after a dose of any tetanus, diphtheria, or pertussis containing vaccine should not get Td or Tdap.  Anyone who has a severe allergy to any component of a vaccine should not get that vaccine. Tell your doctor if the person getting the vaccine has any severe allergies.  Anyone who had a coma, or long or multiple seizures within 7 days after a dose of DTP or DTaP should not get Tdap, unless a cause other than the vaccine was found. These people may get Td.  Talk to your doctor if the person getting either vaccine:  Has epilepsy or another nervous system problem.  Had severe swelling or severe pain after a previous dose of DTP, DTaP, DT, Td, or Tdap vaccine.  Has had Guillain Barr Syndrome (GBS).  Anyone who has  a moderate or severe illness on the day the shot is scheduled should usually wait until they recover before getting Tdap or Td vaccine. A person with a mild illness or low fever can usually be vaccinated. WHAT ARE THE RISKS FROM TDAP AND TD VACCINES? With a vaccine, as with any medicine, there is always a small risk of a life-threatening allergic reaction or other serious problem. Brief fainting spells and related symptoms (such as jerking movements) can happen after any medical procedure, including vaccination. Sitting or lying down for about 15 minutes after a vaccination can help prevent fainting and injuries caused by falls. Tell your doctor if the patient feels dizzy or lightheaded, or has vision changes or ringing in the ears. Getting tetanus, diphtheria, or pertussis would be much more likely to lead to severe problems than getting either Td or Tdap vaccine. Problems reported after Td and Tdap vaccines are listed below. Mild Problems (noticeable, but did not interfere with  activities) Tdap Pain (about 3 in 4 adolescents and 2 in 3 adults).  Redness or swelling (about 1 in 5).  Mild fever of at least 100.4 F (38 C) (up to about 1 in 25 adolescents and 1 in 100 adults).  Headache (about 4 in 10 adolescents and 3 in 10 adults).  Tiredness (about 1 in 3 adolescents and 1 in 4 adults).  Nausea, vomiting, diarrhea, or stomach ache (up to 1 in 4 adolescents and 1 in 10 adults).  Chills, body aches, sore joints, rash, or swollen glands (uncommon).  Td Pain (up to about 8 in 10).  Redness or swelling at the injection site (up to about 1 in 3).  Mild fever (up to about 1 in 15).  Headache or tiredness (uncommon).  Moderate Problems (interfered with activities, but did not require medical attention) Tdap Pain at the injection site (about 1 in 20 adolescents and 1 in 100 adults).  Redness or swelling at the injection site (up to about 1 in 16 adolescents and 1 in 25 adults).  Fever over 102 F (38.9 C) (about 1 in 100 adolescents and 1 in 250 adults).  Headache (1 in 300).  Nausea, vomiting, diarrhea, or stomach ache (up to 3 in 100 adolescents and 1 in 100 adults).  Td Fever over 102 F (38.9 C) (rare).  Tdap or Td Extensive swelling of the arm where the shot was given (up to about 3 in 100).  Severe Problems (Unable to perform usual activities; required medical attention) Tdap or Td Swelling, severe pain, bleeding, and redness in the arm where the shot was given (rare).  A severe allergic reaction could occur after any vaccine. They are estimated to occur less than once in a million doses. WHAT IF THERE IS A SEVERE REACTION? What should I look for? Any unusual condition, such as a severe allergic reaction or a high fever. If a severe allergic reaction occurred, it would be within a few minutes to an hour after the shot. Signs of a serious allergic reaction can include difficulty breathing, weakness, hoarseness or wheezing, a fast heartbeat, hives, dizziness,  paleness, or swelling of the throat. What should I do? Call a doctor, or get the person to a doctor right away.  Tell your doctor what happened, the date and time it happened, and when the vaccination was given.  Ask your provider to report the reaction by filing a Vaccine Adverse Event Reporting System (VAERS) form. Or, you can file this report through the VAERS website at  www.vaers.LAgents.no or by calling 1-(714)196-4615.  VAERS does not provide medical advice. THE NATIONAL VACCINE INJURY COMPENSATION PROGRAM The National Vaccine Injury Compensation Program (VICP) was created in 1986. Persons who believe they may have been injured by a vaccine can learn about the program and about filing a claim by calling 1-815-536-5900 or visiting the VICP website at SpiritualWord.at. HOW CAN I LEARN MORE? Your doctor can give you the vaccine package insert or suggest other sources of information.  Call your local or state health department.  Contact the Centers for Disease Control and Prevention (CDC):  Call 612 287 0912 (1-800-CDC-INFO).  Visit the [facility] website at PicCapture.uy.  CDC Td and Tdap Interim VIS (02/15/10) Document Released: 11/06/2005 Document Revised: 12/29/2010 Document Reviewed: 02/15/2010 [facility] Patient Information 2012 [place], [place].
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
## 2011-10-06 ENCOUNTER — Encounter (HOSPITAL_COMMUNITY): Payer: Self-pay | Admitting: Emergency Medicine

## 2011-10-06 ENCOUNTER — Emergency Department (HOSPITAL_COMMUNITY)
Admission: EM | Admit: 2011-10-06 | Discharge: 2011-10-06 | Disposition: A | Discharge status: Home / Self Care | Payer: Self-pay | Attending: Emergency Medicine | Admitting: Emergency Medicine

## 2011-10-06 DIAGNOSIS — N498 Inflammatory disorders of other specified male genital organs: Secondary | ICD-10-CM | POA: Insufficient documentation

## 2011-10-06 DIAGNOSIS — I10 Essential (primary) hypertension: Secondary | ICD-10-CM | POA: Insufficient documentation

## 2011-10-06 DIAGNOSIS — N5089 Other specified disorders of the male genital organs: Secondary | ICD-10-CM

## 2011-10-06 HISTORY — DX: Essential (primary) hypertension: I10

## 2011-10-06 MED ORDER — SULFAMETHOXAZOLE-TRIMETHOPRIM 800-160 MG PO TABS: 1.0000 | ORAL_TABLET | Freq: Two times a day (BID) | ORAL | Status: AC

## 2011-10-06 MED ORDER — LIDOCAINE HCL 1 % IJ SOLN: 30.0000 mL | Freq: Once | INTRAMUSCULAR | Status: DC

## 2011-10-06 MED ORDER — HYDROCODONE-ACETAMINOPHEN 5-325 MG PO TABS: 1.0000 | ORAL_TABLET | ORAL | Status: AC | PRN

## 2011-10-06 NOTE — ED Provider Notes (Signed)
History     CSN: [id number]  Arrival date & time 10/06/11  1925   First MD Initiated Contact with Patient 10/06/11 2118      Chief Complaint  Patient presents with  . Abscess    (Consider location/radiation/quality/duration/timing/severity/associated sxs/prior treatment) HPI  56 year old male presents complaining of skin lesions to his scrotum. Sts he notices two sores on scrotum about 4 days ago.  It has oozed some pustular or yellow discharged.  It is tender to the touch.  Never had similar sxs in the past.  Has a remote hx of gonorrhea many years ago.  Not recently sexually active.  Denies fever, chills, abd pain, testicular pain or swelling, dysuria.  No prior hx of syphilis or herpes.  No rash on hands or feet.    Past Medical History  Diagnosis Date  . Hypertension     Past Surgical History  Procedure Date  . Knee surgery   . Tonsillectomy   . Appendectomy   . Hand surgery   . Eye surgery     No family history on file.  History  Substance Use Topics  . Smoking status: Never Smoker   . Smokeless tobacco: Not on file  . Alcohol Use: Yes      Review of Systems  All other systems reviewed and are negative.    Allergies  Review of patient's allergies indicates no known allergies.  Home Medications  No current outpatient prescriptions on file.  BP 177/100  Pulse 73  Temp 99 F (37.2 C) (Oral)  Resp 16  SpO2 100%  Physical Exam  Nursing note and vitals reviewed. Constitutional: He is oriented to person, place, and time. He appears well-developed and well-nourished. No distress.  HENT:  Head: Atraumatic.  Mouth/Throat: Oropharynx is clear and moist. No oropharyngeal exudate.  Eyes: Conjunctivae normal are normal.  Neck: Neck supple.  Abdominal: Soft. There is no tenderness. Hernia confirmed negative in the right inguinal area and confirmed negative in the left inguinal area.  Genitourinary: Testes normal and penis normal.    Uncircumcised. No  penile tenderness.  Neurological: He is alert and oriented to person, place, and time.  Skin: Skin is warm.  Psychiatric: He has a normal mood and affect.    ED Course  Procedures (including critical care time)  Labs Reviewed - No data to display No results found.   No diagnosis found.  INCISION AND DRAINAGE Performed by: Fayrene Helper Consent: Verbal consent obtained. Risks and benefits: risks, benefits and alternatives were discussed Type: abscess  Body area: scrotum, right upper  Anesthesia: local infiltration  Local anesthetic: lidocaine 2% w/out epinephrine  Anesthetic total: 5 ml  Complexity: complex Blunt dissection to break up loculations  Drainage: purulent  Drainage amount: minimal  Packing material: 1/4 in iodoform gauze  Patient tolerance: Patient tolerated the procedure well with no immediate complications.  INCISION AND DRAINAGE Performed by: Fayrene Helper Consent: Verbal consent obtained. Risks and benefits: risks, benefits and alternatives were discussed Type: abscess  Body area: scrotum, inferior  Anesthesia: local infiltration  Local anesthetic: lidocaine 2% w/out epinephrine  Anesthetic total: 5 ml  Complexity: complex Blunt dissection to break up loculations  Drainage: purulent  Drainage amount: very minimal  Packing material: too small for packing  Patient tolerance: Patient tolerated the procedure well with no immediate complications.   1. Skin ulcerations, on scrotum  MDM  Ulceration with mild superficial pustular discharge on scrotum.  DDx include abscess, folliculitis, herpetic lesions. Doubt syphilis as it is not  on shaft of penis.  Discussed with my attending.  Plan to d/c with bactrim.  Wound culture and HSV culture sent.  Care instruction include water and soap, warm compress, and neosporin discussed.    BP 177/100  Pulse 73  Temp 99 F (37.2 C) (Oral)  Resp 16  SpO2 100%         Fayrene Helper, PA-C 10/06/11  2234

## 2011-10-06 NOTE — ED Provider Notes (Signed)
Medical screening examination/treatment/procedure(s) were performed by non-physician practitioner and as supervising physician I was immediately available for consultation/collaboration. Devoria Albe, MD, Armando Gang   Ward Givens, MD 10/06/11 [phone]

## 2011-10-06 NOTE — Discharge Instructions (Signed)
You have been evaluated for your skin changes.  Please take antibiotic for the full duration.  Clean wound with soap and water twice daily.  Remove packing in 2 days and return to Urgent Care to have your wound recheck if it is not getting better.    Abscess An abscess (boil or furuncle) is an infected area that contains a collection of pus.  SYMPTOMS Signs and symptoms of an abscess include pain, tenderness, redness, or hardness. You may feel a moveable soft area under your skin. An abscess can occur anywhere in the body.  TREATMENT  A surgical cut (incision) may be made over your abscess to drain the pus. Gauze may be packed into the space or a drain may be looped through the abscess cavity (pocket). This provides a drain that will allow the cavity to heal from the inside outwards. The abscess may be painful for a few days, but should feel much better if it was drained.  Your abscess, if seen early, may not have localized and may not have been drained. If not, another appointment may be required if it does not get better on its own or with medications. HOME CARE INSTRUCTIONS   Only take over-the-counter or prescription medicines for pain, discomfort, or fever as directed by your caregiver.   Take your antibiotics as directed if they were prescribed. Finish them even if you start to feel better.   Keep the skin and clothes clean around your abscess.   If the abscess was drained, you will need to use gauze dressing to collect any draining pus. Dressings will typically need to be changed 3 or more times a day.   The infection may spread by skin contact with others. Avoid skin contact as much as possible.   Practice good hygiene. This includes regular hand washing, cover any draining skin lesions, and do not share personal care items.   If you participate in sports, do not share athletic equipment, towels, whirlpools, or personal care items. Shower after every practice or tournament.   If a  draining area cannot be adequately covered:   Do not participate in sports.   Children should not participate in day care until the wound has healed or drainage stops.   If your caregiver has given you a follow-up appointment, it is very important to keep that appointment. Not keeping the appointment could result in a much worse infection, chronic or permanent injury, pain, and disability. If there is any problem keeping the appointment, you must call back to this facility for assistance.  SEEK MEDICAL CARE IF:   You develop increased pain, swelling, redness, drainage, or bleeding in the wound site.   You develop signs of generalized infection including muscle aches, chills, fever, or a general ill feeling.   You have an oral temperature above 102 F (38.9 C).  MAKE SURE YOU:   Understand these instructions.   Will watch your condition.   Will get help right away if you are not doing well or get worse.  Document Released: 10/19/2004 Document Revised: 12/29/2010 Document Reviewed: 08/13/2007 [facility] Patient Information 2012 [place], [place].

## 2011-10-06 NOTE — ED Notes (Signed)
Pt c/o 2 ?abscesses on testicles x 3days to a week. Pt reports serosanguinous discharge

## 2011-10-09 LAB — WOUND CULTURE: Special Requests: NORMAL

## 2011-10-09 LAB — HERPES SIMPLEX VIRUS CULTURE
Culture: NOT DETECTED
Special Requests: NORMAL

## 2011-10-09 NOTE — ED Notes (Signed)
+   [facility] Patient treated with Bactrim-sensitive to same-chart appended per protocol MD.

## 2011-10-13 ENCOUNTER — Telehealth (HOSPITAL_COMMUNITY): Payer: Self-pay | Admitting: *Deleted

## 2011-10-15 ENCOUNTER — Telehealth (HOSPITAL_COMMUNITY): Payer: Self-pay | Admitting: Emergency Medicine

## 2011-11-26 ENCOUNTER — Emergency Department (HOSPITAL_COMMUNITY)
Admission: EM | Admit: 2011-11-26 | Discharge: 2011-11-26 | Disposition: A | Discharge status: Home / Self Care | Payer: Self-pay | Attending: Emergency Medicine | Admitting: Emergency Medicine

## 2011-11-26 ENCOUNTER — Encounter (HOSPITAL_COMMUNITY): Payer: Self-pay | Admitting: Emergency Medicine

## 2011-11-26 DIAGNOSIS — Y939 Activity, unspecified: Secondary | ICD-10-CM | POA: Insufficient documentation

## 2011-11-26 DIAGNOSIS — I10 Essential (primary) hypertension: Secondary | ICD-10-CM | POA: Insufficient documentation

## 2011-11-26 DIAGNOSIS — Y929 Unspecified place or not applicable: Secondary | ICD-10-CM | POA: Insufficient documentation

## 2011-11-26 DIAGNOSIS — IMO0002 Reserved for concepts with insufficient information to code with codable children: Secondary | ICD-10-CM | POA: Insufficient documentation

## 2011-11-26 DIAGNOSIS — L0291 Cutaneous abscess, unspecified: Secondary | ICD-10-CM

## 2011-11-26 MED ORDER — IBUPROFEN 800 MG PO TABS: 800.0000 mg | ORAL_TABLET | Freq: Three times a day (TID) | ORAL | Status: DC

## 2011-11-26 MED ORDER — CEPHALEXIN 500 MG PO CAPS: 500.0000 mg | ORAL_CAPSULE | Freq: Four times a day (QID) | ORAL | Status: DC

## 2011-11-26 MED ORDER — LIDOCAINE-EPINEPHRINE 2 %-1:100000 IJ SOLN
20.0000 mL | Freq: Once | INTRAMUSCULAR | Status: AC
  Administered 2011-11-26: 1 mL via INTRADERMAL

## 2011-11-26 NOTE — ED Notes (Signed)
Did not check BP l/arm due to pain post I and D

## 2011-11-26 NOTE — ED Notes (Signed)
Pt states he noticed that he "got bit by something" on Thursday.  Bite is on left wrist.  Pt states that it has gotten bigger.  Appx 1 inch in diameter raised area noted to lt wrist.

## 2011-11-26 NOTE — Discharge Instructions (Signed)
Abscess An abscess is an infected area that contains a collection of pus and debris.It can occur in almost any part of the body. An abscess is also known as a furuncle or boil. CAUSES  An abscess occurs when tissue gets infected. This can occur from blockage of oil or sweat glands, infection of hair follicles, or a minor injury to the skin. As the body tries to fight the infection, pus collects in the area and creates pressure under the skin. This pressure causes pain. People with weakened immune systems have difficulty fighting infections and get certain abscesses more often.  SYMPTOMS Usually an abscess develops on the skin and becomes a painful mass that is red, warm, and tender. If the abscess forms under the skin, you may feel a moveable soft area under the skin. Some abscesses break open (rupture) on their own, but most will continue to get worse without care. The infection can spread deeper into the body and eventually into the bloodstream, causing you to feel ill.  DIAGNOSIS  Your caregiver will take your medical history and perform a physical exam. A sample of fluid may also be taken from the abscess to determine what is causing your infection. TREATMENT  Your caregiver may prescribe antibiotic medicines to fight the infection. However, taking antibiotics alone usually does not cure an abscess. Your caregiver may need to make a small cut (incision) in the abscess to drain the pus. In some cases, gauze is packed into the abscess to reduce pain and to continue draining the area. HOME CARE INSTRUCTIONS   Only take over-the-counter or prescription medicines for pain, discomfort, or fever as directed by your caregiver.  If you were prescribed antibiotics, take them as directed. Finish them even if you start to feel better.  If gauze is used, follow your caregiver's directions for changing the gauze.  To avoid spreading the infection:  Keep your draining abscess covered with a  bandage.  Wash your hands well.  Do not share personal care items, towels, or whirlpools with others.  Avoid skin contact with others.  Keep your skin and clothes clean around the abscess.  Keep all follow-up appointments as directed by your caregiver. SEEK MEDICAL CARE IF:   You have increased pain, swelling, redness, fluid drainage, or bleeding.  You have muscle aches, chills, or a general ill feeling.  You have a fever. MAKE SURE YOU:   Understand these instructions.  Will watch your condition.  Will get help right away if you are not doing well or get worse. Document Released: 10/19/2004 Document Revised: 07/11/2011 Document Reviewed: 03/24/2011 [employer] Patient Information 2013 [employer].  Abscess Care After An abscess (also called a boil or furuncle) is an infected area that contains a collection of pus. Signs and symptoms of an abscess include pain, tenderness, redness, or hardness, or you may feel a moveable soft area under your skin. An abscess can occur anywhere in the body. The infection may spread to surrounding tissues causing cellulitis. A cut (incision) by the surgeon was made over your abscess and the pus was drained out. Gauze may have been packed into the space to provide a drain that will allow the cavity to heal from the inside outwards. The boil may be painful for 5 to 7 days. Most people with a boil do not have high fevers. Your abscess, if seen early, may not have localized, and may not have been lanced. If not, another appointment may be required for this if it does   not get better on its own or with medications. HOME CARE INSTRUCTIONS   Only take over-the-counter or prescription medicines for pain, discomfort, or fever as directed by your caregiver.  When you bathe, soak and then remove gauze or iodoform packs at least daily or as directed by your caregiver. You may then wash the wound gently with mild soapy water. Repack with gauze or do as your  caregiver directs. SEEK IMMEDIATE MEDICAL CARE IF:   You develop increased pain, swelling, redness, drainage, or bleeding in the wound site.  You develop signs of generalized infection including muscle aches, chills, fever, or a general ill feeling.  An oral temperature above 102 F (38.9 C) develops, not controlled by medication. See your caregiver for a recheck if you develop any of the symptoms described above. If medications (antibiotics) were prescribed, take them as directed. Document Released: 07/28/2004 Document Revised: 04/03/2011 Document Reviewed: 03/25/2007 [employer] Patient Information 2013 [employer].   

## 2011-11-26 NOTE — ED Provider Notes (Signed)
History     CSN: [id number]  Arrival date & time 11/26/11  0746   First MD Initiated Contact with Patient 11/26/11 [phone]      Chief Complaint  Patient presents with  . Insect Bite    (Consider location/radiation/quality/duration/timing/severity/associated sxs/prior treatment) Patient is a 56 y.o. male presenting with abscess. The history is provided by the patient.  Abscess  This is a new problem. The problem has been gradually worsening. The abscess is present on the left wrist. The problem is moderate. Pertinent negatives include no fever. Associated symptoms comments: Feels he may have been bitten by something that is now swollen and painful, and causing hand to swell as well. No fever. .    Past Medical History  Diagnosis Date  . Hypertension     Past Surgical History  Procedure Date  . Knee surgery   . Tonsillectomy   . Appendectomy   . Hand surgery   . Eye surgery     No family history on file.  History  Substance Use Topics  . Smoking status: Never Smoker   . Smokeless tobacco: Not on file  . Alcohol Use: Yes      Review of Systems  Constitutional: Negative for fever and chills.  Musculoskeletal: Negative.   Skin:       See HPI.  Neurological: Negative.  Negative for numbness.    Allergies  Review of patient's allergies indicates no known allergies.  Home Medications  No current outpatient prescriptions on file.  BP 160/125  Pulse 65  Temp 98.4 F (36.9 C) (Oral)  Resp 18  SpO2 100%  Physical Exam  Constitutional: He is oriented to person, place, and time. He appears well-developed and well-nourished.  Neck: Normal range of motion.  Pulmonary/Chest: Effort normal.  Musculoskeletal: Normal range of motion. He exhibits no edema.  Neurological: He is alert and oriented to person, place, and time.  Skin: Skin is warm and dry.       Left wrist has a swollen area measuring approximately 3 cm in diameter with a central ulceration. No active  drainage. Findings c/w abscess.  Psychiatric: He has a normal mood and affect.    ED Course  Procedures (including critical care time) INCISION AND DRAINAGE Performed by: Langley Adie A Consent: Verbal consent obtained. Risks and benefits: risks, benefits and alternatives were discussed Type: abscess  Body area: left wrist  Anesthesia: local infiltration  Local anesthetic: lidocaine 2% w/ epinephrine  Anesthetic total: 1 ml  Complexity: complex Blunt dissection to break up loculations  Drainage: purulent  Drainage amount: small  Packing material: 1/4 in iodoform gauze  Patient tolerance: Patient tolerated the procedure well with no immediate complications.    Labs Reviewed - No data to display No results found.   No diagnosis found.  1. Cutaneous abscess, left wrist 2. hypertension  MDM  Uncomplicated cutaneous abscess. Patient found to have elevated blood pressure. He has a history and reports having medication that he does not take in favor of treating the blood pressure with vinegar and water. He denies chest pain, SOB.         Rodena Medin, PA-C 11/26/11 0930  Rodena Medin, PA-C 11/26/11 [phone]

## 2011-11-27 NOTE — ED Provider Notes (Signed)
Medical screening examination/treatment/procedure(s) were performed by non-physician practitioner and as supervising physician I was immediately available for consultation/collaboration.   Kaspar Albornoz E Terresa Marlett, MD 11/27/11 0812 

## 2011-11-29 ENCOUNTER — Encounter (HOSPITAL_COMMUNITY): Payer: Self-pay | Admitting: *Deleted

## 2011-11-29 ENCOUNTER — Emergency Department (HOSPITAL_COMMUNITY)
Admission: EM | Admit: 2011-11-29 | Discharge: 2011-11-29 | Disposition: A | Discharge status: Home / Self Care | Payer: Self-pay | Attending: Emergency Medicine | Admitting: Emergency Medicine

## 2011-11-29 DIAGNOSIS — Z4801 Encounter for change or removal of surgical wound dressing: Secondary | ICD-10-CM | POA: Insufficient documentation

## 2011-11-29 DIAGNOSIS — I10 Essential (primary) hypertension: Secondary | ICD-10-CM | POA: Insufficient documentation

## 2011-11-29 DIAGNOSIS — Z5189 Encounter for other specified aftercare: Secondary | ICD-10-CM

## 2011-11-29 NOTE — ED Provider Notes (Signed)
History     CSN: [id number]  Arrival date & time 11/29/11  4098   First MD Initiated Contact with Patient 11/29/11 [phone]      Chief Complaint  Patient presents with  . Wound Check    (Consider location/radiation/quality/duration/timing/severity/associated sxs/prior treatment) HPI  Presents to the emergency departments for evaluation of a recently drained abscess on his left wrist with surrounding cellulitis. The packing had fallen on his own. He says it no longer hurts, the swelling is going down, and has not been expressing pus. He denies having any fevers or weakness. He says that he is still not taking his blood pressure medication and feels that that occur in water works.  Past Medical History  Diagnosis Date  . Hypertension     Past Surgical History  Procedure Date  . Knee surgery   . Tonsillectomy   . Appendectomy   . Hand surgery   . Eye surgery     No family history on file.  History  Substance Use Topics  . Smoking status: Never Smoker   . Smokeless tobacco: Not on file  . Alcohol Use: Yes      Review of Systems  Review of Systems  Gen: no weight loss, fevers, chills, night sweats  Eyes: no discharge or drainage, no occular pain or visual changes  Nose: no epistaxis or rhinorrhea  Mouth: no dental pain, no sore throat  Neck: no neck pain  Lungs:No wheezing, coughing or hemoptysis CV: no chest pain, palpitations, dependent edema or orthopnea  Abd: no abdominal pain, nausea, vomiting  GU: no dysuria or gross hematuria  MSK:  No abnormalities  Neuro: no headache, no focal neurologic deficits  Skin: recently drained abscess Psyche: negative.   Allergies  Review of patient's allergies indicates no known allergies.  Home Medications   Current Outpatient Rx  Name  Route  Sig  Dispense  Refill  . CEPHALEXIN 500 MG PO CAPS   Oral   Take 1 capsule (500 mg total) by mouth 4 (four) times daily.   40 capsule   0   . IBUPROFEN 800 MG PO TABS  Oral   Take 800 mg by mouth 3 (three) times daily. As needed for pain           BP 184/101  Pulse 63  Temp 98 F (36.7 C) (Oral)  Resp 15  SpO2 100%  Physical Exam  Nursing note and vitals reviewed. Constitutional: He appears well-developed and well-nourished. No distress.  HENT:  Head: Normocephalic and atraumatic.  Eyes: Pupils are equal, round, and reactive to light.  Neck: Normal range of motion. Neck supple.  Cardiovascular: Normal rate and regular rhythm.   Pulmonary/Chest: Effort normal.  Abdominal: Soft.  Neurological: He is alert.  Skin: Skin is warm and dry.       ED Course  Wound repair Date/Time: 11/29/2011 9:56 AM Performed by: Dorthula Matas Authorized by: Dorthula Matas Consent: Verbal consent obtained. Comments: Wound unpacked and recovered with sterile gauze.   (including critical care time)  Labs Reviewed - No data to display No results found.   1. Wound check, abscess       MDM  Pt healing well. No surrounding cellulitis present. Will have finish abx and finish if symptoms return or worsen. Pt has been advised of the symptoms that warrant their return to the ED. Patient has voiced understanding and has agreed to follow-up with the PCP or specialist.  Dorthula Matas, PA 11/29/11 1610  Dorthula Matas, PA 11/29/11 [phone]

## 2011-11-29 NOTE — ED Notes (Addendum)
Pt returning for follow up wound check to left arm. Seen on Sunday for I &D and packing

## 2011-11-30 ENCOUNTER — Encounter (HOSPITAL_COMMUNITY): Payer: Self-pay

## 2011-11-30 ENCOUNTER — Emergency Department (HOSPITAL_COMMUNITY)
Admission: EM | Admit: 2011-11-30 | Discharge: 2011-11-30 | Disposition: A | Discharge status: Home / Self Care | Payer: Self-pay | Attending: Emergency Medicine | Admitting: Emergency Medicine

## 2011-11-30 DIAGNOSIS — K122 Cellulitis and abscess of mouth: Secondary | ICD-10-CM | POA: Insufficient documentation

## 2011-11-30 DIAGNOSIS — L0291 Cutaneous abscess, unspecified: Secondary | ICD-10-CM

## 2011-11-30 DIAGNOSIS — I10 Essential (primary) hypertension: Secondary | ICD-10-CM | POA: Insufficient documentation

## 2011-11-30 MED ORDER — SULFAMETHOXAZOLE-TRIMETHOPRIM 800-160 MG PO TABS: 1.0000 | ORAL_TABLET | Freq: Two times a day (BID) | ORAL | Status: DC

## 2011-11-30 NOTE — ED Provider Notes (Signed)
History   This chart was scribed for Hilario Quarry, MD by Gerlean Ren, ER Scribe. This patient was seen in room TR09C/TR09C and the patient's care was started at 4:04 PM    CSN: [id number]  Arrival date & time 11/30/11  1519   First MD Initiated Contact with Patient 11/30/11 1602      Chief Complaint  Patient presents with  . Oral Swelling    (Consider location/radiation/quality/duration/timing/severity/associated sxs/prior treatment) The history is provided by the patient. No language interpreter was used.   Curtis Saunders is a 56 y.o. male who presents to the Emergency Department complaining of 2 days of gradual onset, non-itching left-side upper lip swelling with no reported injury or trauma as cause.  Pt denies chest pain, cough, and dyspnea as associated.  Pt denies any pustule present before swelling began.  Pt has h/o HTN.  Pt denies tobacco use but reports social alcohol use.   Past Medical History  Diagnosis Date  . Hypertension     Past Surgical History  Procedure Date  . Knee surgery   . Tonsillectomy   . Appendectomy   . Hand surgery   . Eye surgery     History reviewed. No pertinent family history.  History  Substance Use Topics  . Smoking status: Never Smoker   . Smokeless tobacco: Not on file  . Alcohol Use: Yes     Comment: socially      Review of Systems  Respiratory: Negative for cough and shortness of breath.   Cardiovascular: Negative for chest pain.  Skin: Negative for rash and wound.       Abscess.    Allergies  Review of patient's allergies indicates no known allergies.  Home Medications   Current Outpatient Rx  Name  Route  Sig  Dispense  Refill  . CEPHALEXIN 500 MG PO CAPS   Oral   Take 1 capsule (500 mg total) by mouth 4 (four) times daily.   40 capsule   0     BP 189/108  Pulse 72  Temp 97 F (36.1 C) (Oral)  Resp 18  SpO2 97%  Physical Exam  Nursing note and vitals reviewed. Constitutional: He is oriented to  person, place, and time. He appears well-developed and well-nourished.  HENT:  Head: Normocephalic and atraumatic.         Left upper lip with swelling, tenderness, pustule. See diagram  Eyes: Conjunctivae normal and EOM are normal.  Neck: Normal range of motion. Neck supple.  Cardiovascular: Normal rate, regular rhythm and normal heart sounds.   Pulmonary/Chest: Effort normal and breath sounds normal.  Musculoskeletal: Normal range of motion.  Neurological: He is alert and oriented to person, place, and time.  Skin: Skin is warm and dry.  Psychiatric: He has a normal mood and affect.    ED Course  Procedures (including critical care time) DIAGNOSTIC STUDIES: Oxygen Saturation is 97% on room air, adequate by my interpretation.    COORDINATION OF CARE: 4:07 PM- Patient informed of clinical course, understands medical decision-making process, and agrees with plan.  Discussed I&D of abscess.        Labs Reviewed - No data to display No results found.   No diagnosis found.    MDM  Please see i and d note by midlevel.   I personally performed the services described in this documentation, which was scribed in my presence. The recorded information has been reviewed and is accurate.  Hilario Quarry, MD 12/01/11 [phone]

## 2011-11-30 NOTE — Discharge Instructions (Signed)
Cellulitis  Cellulitis is an infection of the skin and the tissue under the skin. The infected area is usually red and tender. This happens most often in the arms and lower legs.  HOME CARE    Take your antibiotic medicine as told. Finish the medicine even if you start to feel better.   Keep the infected arm or leg raised (elevated).   Put a warm cloth on the area up to 4 times per day.   Only take medicines as told by your doctor.   Keep all doctor visits as told.  GET HELP RIGHT AWAY IF:    You have a fever.   You feel very sleepy.   You throw up (vomit) or have watery poop (diarrhea).   You feel sick and have muscle aches and pains.   You see red streaks on the skin coming from the infected area.   Your red area gets bigger or turns a dark color.   Your bone or joint under the infected area is painful after the skin heals.   Your infection comes back in the same area or different area.   You have a puffy (swollen) bump in the infected area.   You have new symptoms.  MAKE SURE YOU:    Understand these instructions.   Will watch your condition.   Will get help right away if you are not doing well or get worse.  Document Released: 06/28/2007 Document Revised: 07/11/2011 Document Reviewed: 03/27/2011  [employer] Patient Information 2013 [employer].

## 2011-11-30 NOTE — ED Notes (Signed)
Pt c/o upper lip swelling. Pt states he woke up with lip swelling. Recently on abx for abscess on left wrist, pt stopped taking meds. Pt supposed to be on BP medication, will not take because "they mess with my nature." NAD noted at this time. Pt states he tried to "bust" his lip open, no drainage. Left side of upper lip is swollen and red with yellow center noted to area.

## 2011-12-02 NOTE — ED Provider Notes (Signed)
Medical screening examination/treatment/procedure(s) were performed by non-physician practitioner and as supervising physician I was immediately available for consultation/collaboration.   Amitai Delaughter M Aurelia Gras, DO 12/02/11 2000 

## 2012-03-18 ENCOUNTER — Encounter (HOSPITAL_COMMUNITY): Payer: Self-pay | Admitting: *Deleted

## 2012-03-18 ENCOUNTER — Emergency Department (HOSPITAL_COMMUNITY)
Admission: EM | Admit: 2012-03-18 | Discharge: 2012-03-18 | Disposition: A | Discharge status: Home / Self Care | Payer: Self-pay | Attending: Emergency Medicine | Admitting: Emergency Medicine

## 2012-03-18 DIAGNOSIS — Z79899 Other long term (current) drug therapy: Secondary | ICD-10-CM | POA: Insufficient documentation

## 2012-03-18 DIAGNOSIS — N489 Disorder of penis, unspecified: Secondary | ICD-10-CM | POA: Insufficient documentation

## 2012-03-18 DIAGNOSIS — N4889 Other specified disorders of penis: Secondary | ICD-10-CM

## 2012-03-18 DIAGNOSIS — I10 Essential (primary) hypertension: Secondary | ICD-10-CM | POA: Insufficient documentation

## 2012-03-18 MED ORDER — CEFTRIAXONE SODIUM 250 MG IJ SOLR
250.0000 mg | Freq: Once | INTRAMUSCULAR | Status: AC
  Administered 2012-03-18: 250 mg via INTRAMUSCULAR
  Filled 2012-03-18: qty 250

## 2012-03-18 MED ORDER — LISINOPRIL 20 MG PO TABS
20.0000 mg | ORAL_TABLET | Freq: Once | ORAL | Status: AC
  Administered 2012-03-18: 20 mg via ORAL
  Filled 2012-03-18: qty 1

## 2012-03-18 MED ORDER — AMLODIPINE BESYLATE 10 MG PO TABS: 10.0000 mg | ORAL_TABLET | Freq: Every day | ORAL | Status: DC

## 2012-03-18 MED ORDER — AZITHROMYCIN 1 G PO PACK
1.0000 g | PACK | Freq: Once | ORAL | Status: AC
  Administered 2012-03-18: 1 g via ORAL
  Filled 2012-03-18: qty 1

## 2012-03-18 MED ORDER — AMLODIPINE BESYLATE 10 MG PO TABS
10.0000 mg | ORAL_TABLET | Freq: Once | ORAL | Status: AC
  Administered 2012-03-18: 10 mg via ORAL
  Filled 2012-03-18: qty 1

## 2012-03-18 NOTE — ED Notes (Signed)
Pt reports groin pain x 1 week, worse today.  Pt reports bila flank pain as well intermittently-pt states "i don't know it comes and goes" when asked how long he's been having flank pain.  Pt's BP is elevated, reports taking BP med but has not taken it for "a while."  Pt denies any urinary sxs at this time.

## 2012-03-18 NOTE — Discharge Instructions (Signed)
You have been treated for STDs.   Your partners may need to be treated as well.   You need to take your lisinopril as prescribed. Take norvasc 10mg  daily as well.   Follow up with your doctor.   Return to ER if you have headaches, vomiting, severe pain, penile discharge, testicular pain.

## 2012-03-18 NOTE — ED Provider Notes (Signed)
History     CSN: [id number]  Arrival date & time 03/18/12  4098   First MD Initiated Contact with Patient 03/18/12 1001      Chief Complaint  Patient presents with  . Groin Pain    (Consider location/radiation/quality/duration/timing/severity/associated sxs/prior treatment) The history is provided by the patient.  DODGER SINNING is a 57 y.o. male history of hypertension with medication noncompliance here presenting with groin pain. Left-sided groin pain intermittent for a week. Was at work today and the pain got worse. He said the pain is on the left side his penis but he denies any penile discharge or urinary symptoms.  Denies any fevers or chills or history of hernias. Denies any nausea vomiting constipation or diarrhea. Denies any scrotal pain or testicular pain. He is sexually active with multiple male partners and doesn't always use protection.   Past Medical History  Diagnosis Date  . Hypertension     Past Surgical History  Procedure Laterality Date  . Knee surgery    . Tonsillectomy    . Appendectomy    . Hand surgery    . Eye surgery      No family history on file.  History  Substance Use Topics  . Smoking status: Never Smoker   . Smokeless tobacco: Not on file  . Alcohol Use: Yes     Comment: socially      Review of Systems  Genitourinary: Positive for penile pain.  All other systems reviewed and are negative.    Allergies  Review of patient's allergies indicates no known allergies.  Home Medications   Current Outpatient Rx  Name  Route  Sig  Dispense  Refill  . lisinopril-hydrochlorothiazide (PRINZIDE,ZESTORETIC) 10-12.5 MG per tablet   Oral   Take 1 tablet by mouth daily.         Marland Kitchen amLODipine (NORVASC) 10 MG tablet   Oral   Take 1 tablet (10 mg total) by mouth daily.   30 tablet   0     BP 193/104  Pulse 60  Temp(Src) 97.9 F (36.6 C) (Oral)  Resp 16  SpO2 100%  Physical Exam  Nursing note and vitals  reviewed. Constitutional: He is oriented to person, place, and time. He appears well-developed and well-nourished.  HENT:  Head: Normocephalic.  Mouth/Throat: Oropharynx is clear and moist.  Eyes: Conjunctivae are normal. Pupils are equal, round, and reactive to light.  Neck: Normal range of motion. Neck supple.  Cardiovascular: Normal rate, regular rhythm and normal heart sounds.   Pulmonary/Chest: Effort normal and breath sounds normal. No respiratory distress. He has no wheezes. He has no rales.  Abdominal: Soft. Bowel sounds are normal. He exhibits no distension. There is no tenderness. There is no rebound.  Genitourinary:  Bilateral testicles nontender. Mild tenderness over L side of penis. No penile discharge. No paraphimosis or phimosis. No obvious penile lesions. No inguinal lymphadenopathy. No palpable hernias.   Musculoskeletal: Normal range of motion.  Neurological: He is alert and oriented to person, place, and time.  Skin: Skin is warm and dry.  Psychiatric: He has a normal mood and affect. His behavior is normal. Judgment and thought content normal.    ED Course  Procedures (including critical care time)  Labs Reviewed  GC/CHLAMYDIA PROBE AMP   No results found.   1. Penile pain   2. HTN (hypertension)       MDM  CESAREO VICKREY is a 57 y.o. male here with hypertension, penile pain. His  HTN is chronic and likely from med uncompliance. No evidence of end organ damage. Given sexual history, he is at risk for GC/chlamydia. GC/chlamydia probe sent and patient given ceftriaxone/azithro. No evidence of hernias or testicular torsion.   11:27 AM BP slightly improved after PO meds (190/105 instead of 194/117). Feels well. I told him to take his lisinopril as prescribed. Will add norvasc 10mg  daily. He will f/u with PMD. Return precautions given.       Richardean Canal, MD 03/18/12 1128

## 2012-03-19 LAB — GC/CHLAMYDIA PROBE AMP
CT Probe RNA: NEGATIVE
GC Probe RNA: NEGATIVE

## 2012-09-12 ENCOUNTER — Emergency Department (HOSPITAL_COMMUNITY)
Admission: EM | Admit: 2012-09-12 | Discharge: 2012-09-12 | Disposition: A | Discharge status: Home / Self Care | Payer: Self-pay | Attending: Emergency Medicine | Admitting: Emergency Medicine

## 2012-09-12 ENCOUNTER — Encounter (HOSPITAL_COMMUNITY): Payer: Self-pay | Admitting: Emergency Medicine

## 2012-09-12 DIAGNOSIS — R599 Enlarged lymph nodes, unspecified: Secondary | ICD-10-CM | POA: Insufficient documentation

## 2012-09-12 DIAGNOSIS — I10 Essential (primary) hypertension: Secondary | ICD-10-CM | POA: Insufficient documentation

## 2012-09-12 DIAGNOSIS — Z79899 Other long term (current) drug therapy: Secondary | ICD-10-CM | POA: Insufficient documentation

## 2012-09-12 LAB — URINALYSIS, ROUTINE W REFLEX MICROSCOPIC
Bilirubin Urine: NEGATIVE
Glucose, UA: NEGATIVE mg/dL
Hgb urine dipstick: NEGATIVE
Ketones, ur: NEGATIVE mg/dL
Leukocytes, UA: NEGATIVE
Nitrite: NEGATIVE
Protein, ur: NEGATIVE mg/dL
Specific Gravity, Urine: 1.03 (ref 1.005–1.030)
Urobilinogen, UA: 1 mg/dL (ref 0.0–1.0)
pH: 5 (ref 5.0–8.0)

## 2012-09-12 LAB — CBC WITH DIFFERENTIAL/PLATELET
Basophils Absolute: 0 10*3/uL (ref 0.0–0.1)
Basophils Relative: 0 % (ref 0–1)
Eosinophils Absolute: 0.2 10*3/uL (ref 0.0–0.7)
Eosinophils Relative: 3 % (ref 0–5)
HCT: 38.1 % — ABNORMAL LOW (ref 39.0–52.0)
Hemoglobin: 12.7 g/dL — ABNORMAL LOW (ref 13.0–17.0)
Lymphocytes Relative: 35 % (ref 12–46)
Lymphs Abs: 2.6 10*3/uL (ref 0.7–4.0)
MCH: 28.9 pg (ref 26.0–34.0)
MCHC: 33.3 g/dL (ref 30.0–36.0)
MCV: 86.8 fL (ref 78.0–100.0)
Monocytes Absolute: 0.3 10*3/uL (ref 0.1–1.0)
Monocytes Relative: 5 % (ref 3–12)
Neutro Abs: 4.3 10*3/uL (ref 1.7–7.7)
Neutrophils Relative %: 57 % (ref 43–77)
Platelets: 192 10*3/uL (ref 150–400)
RBC: 4.39 MIL/uL (ref 4.22–5.81)
RDW: 13.7 % (ref 11.5–15.5)
WBC: 7.4 10*3/uL (ref 4.0–10.5)

## 2012-09-12 LAB — COMPREHENSIVE METABOLIC PANEL
ALT: 19 U/L (ref 0–53)
AST: 21 U/L (ref 0–37)
Albumin: 4 g/dL (ref 3.5–5.2)
Alkaline Phosphatase: 65 U/L (ref 39–117)
BUN: 14 mg/dL (ref 6–23)
CO2: 28 mEq/L (ref 19–32)
Calcium: 9.4 mg/dL (ref 8.4–10.5)
Chloride: 105 mEq/L (ref 96–112)
Creatinine, Ser: 1.25 mg/dL (ref 0.50–1.35)
GFR calc Af Amer: 74 mL/min — ABNORMAL LOW (ref >90)
GFR calc non Af Amer: 64 mL/min — ABNORMAL LOW (ref >90)
Glucose, Bld: 110 mg/dL — ABNORMAL HIGH (ref 70–99)
Potassium: 3.8 mEq/L (ref 3.5–5.1)
Sodium: 140 mEq/L (ref 135–145)
Total Bilirubin: 0.3 mg/dL (ref 0.3–1.2)
Total Protein: 7.2 g/dL (ref 6.0–8.3)

## 2012-09-12 NOTE — ED Notes (Signed)
Pt presenting to ed with c/o bilateral flank pain pt denies hematuria at this time. Pt denies nausea and vomiting at this time

## 2012-09-12 NOTE — ED Notes (Signed)
MD at bedside. 

## 2012-09-12 NOTE — Discharge Instructions (Signed)
Hypertension As your heart beats, it forces blood through your arteries. This force is your blood pressure. If the pressure is too high, it is called hypertension (HTN) or high blood pressure. HTN is dangerous because you may have it and not know it. High blood pressure may mean that your heart has to work harder to pump blood. Your arteries may be narrow or stiff. The extra work puts you at risk for heart disease, stroke, and other problems.  Blood pressure consists of two numbers, a higher number over a lower, 110/72, for example. It is stated as "110 over 72." The ideal is below 120 for the top number (systolic) and under 80 for the bottom (diastolic). Write down your blood pressure today. You should pay close attention to your blood pressure if you have certain conditions such as:  Heart failure.  Prior heart attack.  Diabetes  Chronic kidney disease.  Prior stroke.  Multiple risk factors for heart disease. To see if you have HTN, your blood pressure should be measured while you are seated with your arm held at the level of the heart. It should be measured at least twice. A one-time elevated blood pressure reading (especially in the Emergency Department) does not mean that you need treatment. There may be conditions in which the blood pressure is different between your right and left arms. It is important to see your caregiver soon for a recheck. Most people have essential hypertension which means that there is not a specific cause. This type of high blood pressure may be lowered by changing lifestyle factors such as:  Stress.  Smoking.  Lack of exercise.  Excessive weight.  Drug/tobacco/alcohol use.  Eating less salt. Most people do not have symptoms from high blood pressure until it has caused damage to the body. Effective treatment can often prevent, delay or reduce that damage. TREATMENT  When a cause has been identified, treatment for high blood pressure is directed at the  cause. There are a large number of medications to treat HTN. These fall into several categories, and your caregiver will help you select the medicines that are best for you. Medications may have side effects. You should review side effects with your caregiver. If your blood pressure stays high after you have made lifestyle changes or started on medicines,   Your medication(s) may need to be changed.  Other problems may need to be addressed.  Be certain you understand your prescriptions, and know how and when to take your medicine.  Be sure to follow up with your caregiver within the time frame advised (usually within two weeks) to have your blood pressure rechecked and to review your medications.  If you are taking more than one medicine to lower your blood pressure, make sure you know how and at what times they should be taken. Taking two medicines at the same time can result in blood pressure that is too low. SEEK IMMEDIATE MEDICAL CARE IF:  You develop a severe headache, blurred or changing vision, or confusion.  You have unusual weakness or numbness, or a faint feeling.  You have severe chest or abdominal pain, vomiting, or breathing problems. MAKE SURE YOU:   Understand these instructions.  Will watch your condition.  Will get help right away if you are not doing well or get worse. Document Released: 01/09/2005 Document Revised: 04/03/2011 Document Reviewed: 08/30/2007 [facility] Patient Information 2014 [place], [place]. RESOURCE GUIDE  Chronic Pain Problems: Contact [facility]  [phone] Patients need  to be referred by their primary care doctor.  Insufficient Money for Medicine: Contact United Way:  call [phone]  No Primary Care Doctor: - Call Health Connect  [phone] - can help you locate a primary care doctor that  accepts your insurance, provides certain services, etc. - Physician Referral Service- [phone]  Agencies that provide  inexpensive medical care: - [facility]  [phone] - [facility]  [phone] - [facility]  [phone] - Women's Clinic  [phone] - Planned Parenthood  [phone] [facility]  [phone]  Medicaid-accepting [facility] Providers: - [facility]- [address]  [phone], Mon-Fri 9am-7pm, Sat 9am-1pm - [facility]- [address] [place], Suite [place]  [phone] - [facility]- [address] [place]  [phone] [facility]- [address]  [phone] - Renaye Rakers- [address] [place], [address], [phone]  Only accepts [place] patients after they have their name  applied to their card  Self Pay (no insurance) in [employer]: - Sickle Cell Patients - [facility]  [address] [place], [phone] - [facility]- [address] [place]  [phone]       [facility] [place]- [address]       -     [facility]- see information above (Speak to [employer] if you do not have insurance)       -  [facility]- [address],  [phone]       -  [facility]- [address] [phone]       -  Dr Julio Sicks-  [address], [place], [phone]       -  Urgent Medical and [facility] - [address] [phone]       -  [facility]- [address] [phone], also [address], [phone]       -     [facility]- [address] [place], [phone], 1st & 3rd Saturday         every month, 10am-1pm  -     [facility] and [facility]   [address] [place], [place].   Phone:  [phone], Fax:  [phone]. Hours of Operation:  9 am - 6 pm, M-F.  -     [facility]   [address], [place]   Phone: [phone], Fax: [phone]. Hours of Operation:  8:30 am -  5:30 pm, M-F.  [facility] [address] [place], [place] [address] [phone]  [facility] [address] [place], [place] [address] [phone]  1) Find a Doctor and Pay Out of Pocket Although you won't have to find out who is covered by your insurance plan, it is a good idea to ask around and get recommendations. You will then need to call the office and see if the doctor you have chosen will accept you as a new patient and what types of options they offer for patients who are self-pay. Some doctors offer discounts or will set up payment plans for their patients who do not have insurance, but you will need to ask so you aren't surprised when you get to your appointment.  2) Contact Your Local Health Department Not all health departments have doctors that can see patients for sick visits, but many do, so it is worth a call to see if yours does. If you don't know where your local health department is, you can check in your phone book. The CDC also has a tool to help you locate your state's health department, and many state websites also have listings of all of their local health departments.  3) Find a Walk-in Clinic If your illness is not likely to be very severe or complicated, you may want to try a walk in clinic. These are popping up all over the country in pharmacies, drugstores, and shopping centers. They're usually staffed by nurse practitioners or physician assistants that have been trained to treat common illnesses and complaints. They're usually fairly quick and inexpensive. However, if you have serious medical issues or chronic medical problems, these are probably not your best option  STD Testing - [facility] Department of [facility] [place], STD Clinic, [address] [place], phone [phone] or [phone].  Monday - Friday, call for an appointment. [facility] of [employer], STD Clinic,  [address], [place], phone [phone] or [phone].  Monday - Friday, call for an appointment.  Abuse/Neglect: [facility] Child Abuse Hotline (870) 384-5334 [facility] Child Abuse Hotline 910-698-8393 (After Hours)  Emergency Shelter:  [facility] (315)604-5358  Maternity Homes: - Room at the [place] [facility] [phone] - [facility] 7201646538  MRSA Hotline #:   210-335-2072  Dental Assistance If unable to pay or uninsured, contact:  [facility]. to become qualified for the adult dental clinic.  Patients with Medicaid: [facility] [phone] [address], [phone] [address], [phone]  If unable to pay, or uninsured, contact [facility] [phone] in [place], [phone] in [place]) to become qualified for the adult dental clinic  [facility] [address] [place], [place] [address] [phone] [email or web address]  Other Proofreader Services: - [facility]- [address] [place], [place], [place], [address], [phone], Ext. [phone], 2nd and 4th Thursday of the month at 6:30am.  10 clients each day by appointment, can sometimes see walk-in patients if someone does not show for an appointment. [facility]- [address] [place], [place], [address], [phone] - [facility] [address], [place], [place], [address], [phone] - [facility]- [phone] [facility] Health Department- [phone] [facility] Health [phone]       Behavioral Health Resources in the [facility]  Intensive Outpatient Programs: [facility]      [address] [place], [place] [phone] Both a day and evening program       [facility] Outpatient     [address]        [place], [place] [address] [phone]         ADS:  [facility] [address] [place] [phone]  [facility] Mental Health ACCESS LINE: (908) [phone] or [phone] [address] [place], [place] [address] [email or web address]   Substance Abuse Resources: - Alcohol and Drug Services  ([phone] - [facility] [phone] - [facility] [place] [phone] Floydene Flock [phone] - Residential & Outpatient Substance Abuse Program  ([phone]  Psychological Services: - [facility] Behavioral Health  [phone] Anselmo [facility]  [phone] - [facility], [phone] [address], [place], ACCESS LINE: [phone] or 4387721441, [email or web address]  Mobile Crisis Teams:                                        Therapeutic Alternatives         Mobile Crisis Care Unit [phone]             [facility] [address] Ginette Otto [phone]                                         Interventionist [address] [place] [address] [place] [phone]  Self-Help/Support Groups: Mental Health Assoc. of [employer] of support groups 740-405-4419 (call for more info)  Narcotics Anonymous (NA) Caring Services [address] [place] - 2 meetings at this location  Residential Treatment Programs:  [facility]      [address]        [place]       [phone]         [facility] [address] [place] [address] [place], [place]  [address] [phone]  [facility]  [address] [place], [place] [address] [phone] Admissions: 8am-3pm M-F  [facility]     [address]        [place], [place] [address]       [phone]         [facility] [address] [place], [place] [phone]  [facility] [address] [place],  [place] [phone]  [facility] - Intensive Outpatient      [address]     [place], [place]       [phone]         [facility] (Addiction Recovery [facility]     [address] [place], [place] [phone] or [phone]  Residential Treatment Services (RTS), Medicaid [address] [place], [place] [phone]  [facility] [address]                                               [address] [place] [phone]  [facility] Resources: [phone]               General Therapy                                                Angie Fava, PhD        [address] [place], [place] [address]         [phone]   Insurance  [facility] Behavioral   [address] [place], [place] [address] [phone]  [facility] [address] [place], [place] [address] [phone] Insurance/Medicaid/sponsorship through [facility] and Families                                              [address]                                        [place], [place] [address]    Therapy/tele-psych/case         [phone]          [facility] [address], [place]  [address]  Adolescent/group home/case management [phone]                                           Creola Corn PhD       General therapy       Insurance   [phone]         Dr. Lolly Mustache, [place], M-F [phone]  [facility]  United [employer] [facility] Dept. [address]                 [address]         [address] [place] [address]  Blondell Reveal Phone:  [phone]                                  Phone:  [phone]                   Phone:  [phone]  [facility] Mental Health, [phone] - [facility] - [facility]-  [phone]       -     [facility] in [place], [address]             [phone], Insurance  [place] Child Abuse Hotline 773-269-9562 or (613)225-4968 (After Hours)

## 2012-09-12 NOTE — ED Provider Notes (Signed)
CSN: [id number]     Arrival date & time 09/12/12  1538 History     First MD Initiated Contact with Patient 09/12/12 1600     Chief Complaint  Patient presents with  . Flank Pain   (Consider location/radiation/quality/duration/timing/severity/associated sxs/prior Treatment) Patient is a 57 y.o. male presenting with flank pain. The history is provided by the patient. No language interpreter was used.  Flank Pain This is a new problem. The current episode started in the past 7 days. The problem occurs constantly. The problem has been gradually worsening. Associated symptoms include swollen glands. Pertinent negatives include no chills, fever, nausea or vomiting. He has tried nothing for the symptoms.    Past Medical History  Diagnosis Date  . Hypertension    Past Surgical History  Procedure Laterality Date  . Knee surgery    . Tonsillectomy    . Appendectomy    . Hand surgery    . Eye surgery     No family history on file. History  Substance Use Topics  . Smoking status: Never Smoker   . Smokeless tobacco: Not on file  . Alcohol Use: Yes     Comment: socially    Review of Systems  Constitutional: Negative for fever and chills.  Gastrointestinal: Negative for nausea and vomiting.  Genitourinary: Positive for flank pain.  All other systems reviewed and are negative.    Allergies  Review of patient's allergies indicates no known allergies.  Home Medications   Current Outpatient Rx  Name  Route  Sig  Dispense  Refill  . amLODipine (NORVASC) 10 MG tablet   Oral   Take 1 tablet (10 mg total) by mouth daily.   30 tablet   0   . lisinopril-hydrochlorothiazide (PRINZIDE,ZESTORETIC) 10-12.5 MG per tablet   Oral   Take 1 tablet by mouth daily.          BP 212/110  Pulse 66  Temp(Src) 98.3 F (36.8 C) (Oral)  Resp 20  SpO2 98% Physical Exam  Nursing note and vitals reviewed. Constitutional: He is oriented to person, place, and time. He appears well-developed  and well-nourished.  HENT:  Head: Normocephalic.  Eyes: Conjunctivae are normal. Pupils are equal, round, and reactive to light.  Neck: Normal range of motion. Neck supple.  Cardiovascular: Normal rate, regular rhythm and normal heart sounds.   Pulmonary/Chest: Effort normal and breath sounds normal.  Abdominal: Soft. Bowel sounds are normal. He exhibits no distension. There is no tenderness.  Musculoskeletal: Normal range of motion.  Lymphadenopathy:    He has cervical adenopathy.  Neurological: He is alert and oriented to person, place, and time.  Skin: Skin is warm and dry.  Psychiatric: He has a normal mood and affect. His behavior is normal. Judgment and thought content normal.    ED Course   Procedures (including critical care time)  Labs Reviewed  CBC WITH DIFFERENTIAL  COMPREHENSIVE METABOLIC PANEL  URINALYSIS, ROUTINE W REFLEX MICROSCOPIC   No results found. No diagnosis found. Patient reporting several day history of bilateral flank pain.  No fevers.  Moderate bilateral CVA tenderness.  Non-toxic appearing.  Labs pending.  Lab results reviewed and shared with patient.  Discussed patient with Dr. Jeraldine Loots.  Will recommend outpatient renal ultrasound.  Patient endorses non-compliance with his blood pressure medication, stating it keeps him up at night d/t urination.  Discussed altering timing of medication from bedtime to breakfast.  Patient does not have a PCP, resource information provided. MDM  Jimmye Norman, NP 09/12/12 1739

## 2012-09-13 NOTE — ED Provider Notes (Signed)
  Medical screening examination/treatment/procedure(s) were performed by non-physician practitioner and as supervising physician I was immediately available for consultation/collaboration.    Gerhard Munch, MD 09/13/12 [phone]

## 2013-04-04 ENCOUNTER — Encounter (HOSPITAL_COMMUNITY): Payer: Self-pay | Admitting: Emergency Medicine

## 2013-04-04 ENCOUNTER — Emergency Department (HOSPITAL_COMMUNITY)
Admission: EM | Admit: 2013-04-04 | Discharge: 2013-04-04 | Disposition: A | Discharge status: Home / Self Care | Payer: Self-pay | Attending: Emergency Medicine | Admitting: Emergency Medicine

## 2013-04-04 ENCOUNTER — Emergency Department (HOSPITAL_COMMUNITY): Payer: Self-pay

## 2013-04-04 DIAGNOSIS — N5089 Other specified disorders of the male genital organs: Secondary | ICD-10-CM | POA: Insufficient documentation

## 2013-04-04 DIAGNOSIS — N390 Urinary tract infection, site not specified: Secondary | ICD-10-CM | POA: Insufficient documentation

## 2013-04-04 DIAGNOSIS — I10 Essential (primary) hypertension: Secondary | ICD-10-CM | POA: Insufficient documentation

## 2013-04-04 DIAGNOSIS — K4090 Unilateral inguinal hernia, without obstruction or gangrene, not specified as recurrent: Secondary | ICD-10-CM | POA: Insufficient documentation

## 2013-04-04 DIAGNOSIS — Z792 Long term (current) use of antibiotics: Secondary | ICD-10-CM | POA: Insufficient documentation

## 2013-04-04 LAB — CBC WITH DIFFERENTIAL/PLATELET
Basophils Absolute: 0 10*3/uL (ref 0.0–0.1)
Basophils Relative: 0 % (ref 0–1)
Eosinophils Absolute: 0.1 10*3/uL (ref 0.0–0.7)
Eosinophils Relative: 1 % (ref 0–5)
HCT: 38.6 % — ABNORMAL LOW (ref 39.0–52.0)
Hemoglobin: 12.8 g/dL — ABNORMAL LOW (ref 13.0–17.0)
Lymphocytes Relative: 17 % (ref 12–46)
Lymphs Abs: 2.1 10*3/uL (ref 0.7–4.0)
MCH: 29.4 pg (ref 26.0–34.0)
MCHC: 33.2 g/dL (ref 30.0–36.0)
MCV: 88.5 fL (ref 78.0–100.0)
Monocytes Absolute: 0.7 10*3/uL (ref 0.1–1.0)
Monocytes Relative: 6 % (ref 3–12)
Neutro Abs: 9.5 10*3/uL — ABNORMAL HIGH (ref 1.7–7.7)
Neutrophils Relative %: 77 % (ref 43–77)
Platelets: 226 10*3/uL (ref 150–400)
RBC: 4.36 MIL/uL (ref 4.22–5.81)
RDW: 13.7 % (ref 11.5–15.5)
WBC: 12.5 10*3/uL — ABNORMAL HIGH (ref 4.0–10.5)

## 2013-04-04 LAB — URINALYSIS, ROUTINE W REFLEX MICROSCOPIC
Bilirubin Urine: NEGATIVE
Glucose, UA: NEGATIVE mg/dL
Ketones, ur: NEGATIVE mg/dL
Nitrite: POSITIVE — AB
Protein, ur: NEGATIVE mg/dL
Specific Gravity, Urine: 1.021 (ref 1.005–1.030)
Urobilinogen, UA: 1 mg/dL (ref 0.0–1.0)
pH: 5 (ref 5.0–8.0)

## 2013-04-04 LAB — BASIC METABOLIC PANEL
BUN: 16 mg/dL (ref 6–23)
CO2: 24 mEq/L (ref 19–32)
Calcium: 9.7 mg/dL (ref 8.4–10.5)
Chloride: 101 mEq/L (ref 96–112)
Creatinine, Ser: 1.34 mg/dL (ref 0.50–1.35)
GFR calc Af Amer: 67 mL/min — ABNORMAL LOW (ref >90)
GFR calc non Af Amer: 58 mL/min — ABNORMAL LOW (ref >90)
Glucose, Bld: 116 mg/dL — ABNORMAL HIGH (ref 70–99)
Potassium: 4 mEq/L (ref 3.7–5.3)
Sodium: 139 mEq/L (ref 137–147)

## 2013-04-04 LAB — URINE MICROSCOPIC-ADD ON

## 2013-04-04 MED ORDER — CEPHALEXIN 500 MG PO CAPS: 500.0000 mg | ORAL_CAPSULE | Freq: Two times a day (BID) | ORAL | Status: DC

## 2013-04-04 MED ORDER — CEPHALEXIN 500 MG PO CAPS
500.0000 mg | ORAL_CAPSULE | Freq: Once | ORAL | Status: AC
  Administered 2013-04-04: 500 mg via ORAL
  Filled 2013-04-04: qty 1

## 2013-04-04 MED ORDER — ONDANSETRON 8 MG PO TBDP: 8.0000 mg | ORAL_TABLET | Freq: Three times a day (TID) | ORAL | Status: DC | PRN

## 2013-04-04 MED ORDER — HYDROCODONE-ACETAMINOPHEN 5-325 MG PO TABS: 1.0000 | ORAL_TABLET | Freq: Four times a day (QID) | ORAL | Status: DC | PRN

## 2013-04-04 NOTE — ED Notes (Signed)
Pt reports a that he has been having chills, groin pain, urinary frequency and urgency, and was noted to have hematuria for the past week, increasing today. Pt a&O x4, NAD noted at this time

## 2013-04-04 NOTE — ED Provider Notes (Signed)
CSN: [id number]     Arrival date & time 04/04/13  0053 History   First MD Initiated Contact with Patient 04/04/13 0239     Chief Complaint  Patient presents with  . Chills  . Hematuria     (Consider location/radiation/quality/duration/timing/severity/associated sxs/prior Treatment) HPI Comments: Pt comes in with cc of pain in the perineal area and hematuria. Pt started feeling unwell today. Has some dysuria at the end of the urine. No penile discharge, no hx of STD, no risk factors for the same. Pt has noted swelling of his scrotum as well. No trauma. + chills, no back pain, no fevers.  Patient is a 58 y.o. male presenting with hematuria. The history is provided by the patient.  Hematuria Pertinent negatives include no chest pain, no abdominal pain and no shortness of breath.    Past Medical History  Diagnosis Date  . Hypertension    Past Surgical History  Procedure Laterality Date  . Knee surgery      x3, x1 R  . Tonsillectomy    . Appendectomy    . Hand surgery    . Eye surgery     History reviewed. No pertinent family history. History  Substance Use Topics  . Smoking status: Never Smoker   . Smokeless tobacco: Never Used  . Alcohol Use: Yes     Comment: socially    Review of Systems  Constitutional: Negative for activity change and appetite change.  Respiratory: Negative for cough and shortness of breath.   Cardiovascular: Negative for chest pain.  Gastrointestinal: Negative for abdominal pain.  Genitourinary: Positive for hematuria and scrotal swelling. Negative for dysuria, frequency, flank pain, discharge, penile swelling and penile pain.  All other systems reviewed and are negative.      Allergies  Review of patient's allergies indicates no known allergies.  Home Medications   Current Outpatient Rx  Name  Route  Sig  Dispense  Refill  . cephALEXin (KEFLEX) 500 MG capsule   Oral   Take 1 capsule (500 mg total) by mouth 2 (two) times daily.   20  capsule   0   . HYDROcodone-acetaminophen (NORCO/VICODIN) 5-325 MG per tablet   Oral   Take 1 tablet by mouth every 6 (six) hours as needed.   15 tablet   0   . ondansetron (ZOFRAN ODT) 8 MG disintegrating tablet   Oral   Take 1 tablet (8 mg total) by mouth every 8 (eight) hours as needed for nausea.   20 tablet   0    BP 161/101  Pulse 95  Temp(Src) 99.4 F (37.4 C) (Oral)  Resp 16  Ht 6' (1.829 m)  Wt 209 lb (94.802 kg)  BMI 28.34 kg/m2  SpO2 97% Physical Exam  Nursing note and vitals reviewed. Constitutional: He is oriented to person, place, and time. He appears well-developed.  HENT:  Head: Normocephalic and atraumatic.  Eyes: Conjunctivae and EOM are normal. Pupils are equal, round, and reactive to light.  Neck: Normal range of motion. Neck supple.  Cardiovascular: Normal rate and regular rhythm.   Pulmonary/Chest: Effort normal and breath sounds normal.  Abdominal: Soft. Bowel sounds are normal. He exhibits no distension. There is no tenderness. There is no rebound and no guarding.  Genitourinary:  Left scrotal swelling, with reducible hernia type lesion. Tender to palpation. Pt also has inguinal region tenderness palpation, with no bulging.  Neurological: He is alert and oriented to person, place, and time.  Skin: Skin is warm.  ED Course  Procedures (including critical care time) Labs Review Labs Reviewed  URINALYSIS, ROUTINE W REFLEX MICROSCOPIC - Abnormal; Notable for the following:    APPearance CLOUDY (*)    Hgb urine dipstick LARGE (*)    Nitrite POSITIVE (*)    Leukocytes, UA LARGE (*)    All other components within normal limits  URINE MICROSCOPIC-ADD ON - Abnormal; Notable for the following:    Bacteria, UA MANY (*)    All other components within normal limits  CBC WITH DIFFERENTIAL - Abnormal; Notable for the following:    WBC 12.5 (*)    Hemoglobin 12.8 (*)    HCT 38.6 (*)    Neutro Abs 9.5 (*)    All other components within normal limits   BASIC METABOLIC PANEL - Abnormal; Notable for the following:    Glucose, Bld 116 (*)    GFR calc non Af Amer 58 (*)    GFR calc Af Amer 67 (*)    All other components within normal limits  URINE CULTURE   Imaging Review US Scrotum  04/04/2013   CLINICAL DATA:  Left scrotal swelling and pain.  EXAM: SCROTAL ULTRASOUND  DOPPLER ULTRASOUND OF THE TESTICLES  TECHNIQUE: Complete ultrasound examination of the testicles, epididymis, and other scrotal structures was performed. Color and spectral Doppler ultrasound were also utilized to evaluate blood flow to the testicles.  COMPARISON:  None.  FINDINGS: Right testicle  Measurements: 4.0 x 1.9 x 2.9 cm. No mass or microlithiasis visualized.  Left testicle  Measurements: 4.0 x 1.4 x 2.9 cm. No mass or microlithiasis visualized.  Right epididymis:  3 mm cyst.  Left epididymis:  Normal in size and appearance.  Hydrocele:  None visualized.  Varicocele:  Right greater than left varicoceles.  Pulsed Doppler interrogation of both testes demonstrates low resistance arterial and venous waveforms bilaterally.  Left greater than right fat containing inguinal hernias. No definite bowel visualized.  IMPRESSION: Color Doppler flow with arterial and venous waveforms documented to each testicle.  Left greater than right fat containing inguinal hernias. No definite herniation of bowel. Correlate with CT if further characterization is clinically warranted.  Small right greater than left varicoceles.   Electronically Signed   By: Carlos Levering M.D.   On: 04/04/2013 04:46   [place] Art/ven Flow Abd Pelv Doppler  04/04/2013   CLINICAL DATA:  Left scrotal swelling and pain.  EXAM: SCROTAL ULTRASOUND  DOPPLER ULTRASOUND OF THE TESTICLES  TECHNIQUE: Complete ultrasound examination of the testicles, epididymis, and other scrotal structures was performed. Color and spectral Doppler ultrasound were also utilized to evaluate blood flow to the testicles.  COMPARISON:  None.  FINDINGS: Right  testicle  Measurements: 4.0 x 1.9 x 2.9 cm. No mass or microlithiasis visualized.  Left testicle  Measurements: 4.0 x 1.4 x 2.9 cm. No mass or microlithiasis visualized.  Right epididymis:  3 mm cyst.  Left epididymis:  Normal in size and appearance.  Hydrocele:  None visualized.  Varicocele:  Right greater than left varicoceles.  Pulsed Doppler interrogation of both testes demonstrates low resistance arterial and venous waveforms bilaterally.  Left greater than right fat containing inguinal hernias. No definite bowel visualized.  IMPRESSION: Color Doppler flow with arterial and venous waveforms documented to each testicle.  Left greater than right fat containing inguinal hernias. No definite herniation of bowel. Correlate with CT if further characterization is clinically warranted.  Small right greater than left varicoceles.   Electronically Signed   By: Carlos Levering  M.D.   On: 04/04/2013 04:46     EKG Interpretation None      MDM   Final diagnoses:  UTI (lower urinary tract infection)  Inguinal hernia    Pt comes in with UTI like sx and chills and inguinal hernia. Hernia reducible. Return precautions discussed, and surgery f/u given. Pt also given keflex for UTI - and we are giving him 10 day course given possible pyelo.   Varney Biles, MD 04/04/13 [phone]

## 2013-04-04 NOTE — Discharge Instructions (Signed)
Inguinal Hernia, Adult Muscles help keep everything in the body in its proper place. But if a weak spot in the muscles develops, something can poke through. That is called a hernia. When this happens in the lower part of the belly (abdomen), it is called an inguinal hernia. (It takes its name from a part of the body in this region called the inguinal canal.) A weak spot in the wall of muscles lets some fat or part of the small intestine bulge through. An inguinal hernia can develop at any age. Men get them more often than women. CAUSES  In adults, an inguinal hernia develops over time.  It can be triggered by:  Suddenly straining the muscles of the lower abdomen.  Lifting heavy objects.  Straining to have a bowel movement. Difficult bowel movements (constipation) can lead to this.  Constant coughing. This may be caused by smoking or lung disease.  Being overweight.  Being pregnant.  Working at a job that requires long periods of standing or heavy lifting.  Having had an inguinal hernia before. One type can be an emergency situation. It is called a strangulated inguinal hernia. It develops if part of the small intestine slips through the weak spot and cannot get back into the abdomen. The blood supply can be cut off. If that happens, part of the intestine may die. This situation requires emergency surgery. SYMPTOMS  Often, a small inguinal hernia has no symptoms. It is found when a healthcare provider does a physical exam. Larger hernias usually have symptoms.   In adults, symptoms may include:  A lump in the groin. This is easier to see when the person is standing. It might disappear when lying down.  In men, a lump in the scrotum.  Pain or burning in the groin. This occurs especially when lifting, straining or coughing.  A dull ache or feeling of pressure in the groin.  Signs of a strangulated hernia can include:  A bulge in the groin that becomes very painful and tender to the  touch.  A bulge that turns red or purple.  Fever, nausea and vomiting.  Inability to have a bowel movement or to pass gas. DIAGNOSIS  To decide if you have an inguinal hernia, a healthcare provider will probably do a physical examination.  This will include asking questions about any symptoms you have noticed.  The healthcare provider might feel the groin area and ask you to cough. If an inguinal hernia is felt, the healthcare provider may try to slide it back into the abdomen.  Usually no other tests are needed. TREATMENT  Treatments can vary. The size of the hernia makes a difference. Options include:  Watchful waiting. This is often suggested if the hernia is small and you have had no symptoms.  No medical procedure will be done unless symptoms develop.  You will need to watch closely for symptoms. If any occur, contact your healthcare provider right away.  Surgery. This is used if the hernia is larger or you have symptoms.  Open surgery. This is usually an outpatient procedure (you will not stay overnight in a hospital). An cut (incision) is made through the skin in the groin. The hernia is put back inside the abdomen. The weak area in the muscles is then repaired by herniorrhaphy or hernioplasty. Herniorrhaphy: in this type of surgery, the weak muscles are sewn back together. Hernioplasty: a patch or mesh is used to close the weak area in the abdominal wall.  Laparoscopy.  In this procedure, a surgeon makes small incisions. A thin tube with a tiny video camera (called a laparoscope) is put into the abdomen. The surgeon repairs the hernia with mesh by looking with the video camera and using two long instruments. HOME CARE INSTRUCTIONS   After surgery to repair an inguinal hernia:  You will need to take pain medicine prescribed by your healthcare provider. Follow all directions carefully.  You will need to take care of the wound from the incision.  Your activity will be  restricted for awhile. This will probably include no heavy lifting for several weeks. You also should not do anything too active for a few weeks. When you can return to work will depend on the type of job that you have.  During "watchful waiting" periods, you should:  Maintain a healthy weight.  Eat a diet high in fiber (fruits, vegetables and whole grains).  Drink plenty of fluids to avoid constipation. This means drinking enough water and other liquids to keep your urine clear or pale yellow.  Do not lift heavy objects.  Do not stand for long periods of time.  Quit smoking. This should keep you from developing a frequent cough. SEEK MEDICAL CARE IF:   A bulge develops in your groin area.  You feel pain, a burning sensation or pressure in the groin. This might be worse if you are lifting or straining.  You develop a fever of more than 100.5 F (38.1 C). SEEK IMMEDIATE MEDICAL CARE IF:   Pain in the groin increases suddenly.  A bulge in the groin gets bigger suddenly and does not go down.  For men, there is sudden pain in the scrotum. Or, the size of the scrotum increases.  A bulge in the groin area becomes red or purple and is painful to touch.  You have nausea or vomiting that does not go away.  You feel your heart beating much faster than normal.  You cannot have a bowel movement or pass gas.  You develop a fever of more than 102.0 F (38.9 C). Document Released: 05/28/2008 Document Revised: 04/03/2011 Document Reviewed: 05/28/2008 [facility] Patient Information 2014 [place], [place].  Urinary Tract Infection Urinary tract infections (UTIs) can develop anywhere along your urinary tract. Your urinary tract is your body's drainage system for removing wastes and extra water. Your urinary tract includes two kidneys, two ureters, a bladder, and a urethra. Your kidneys are a pair of bean-shaped organs. Each kidney is about the size of your fist. They are located below your  ribs, one on each side of your spine. CAUSES Infections are caused by microbes, which are microscopic organisms, including fungi, viruses, and bacteria. These organisms are so small that they can only be seen through a microscope. Bacteria are the microbes that most commonly cause UTIs. SYMPTOMS  Symptoms of UTIs may vary by age and gender of the patient and by the location of the infection. Symptoms in young women typically include a frequent and intense urge to urinate and a painful, burning feeling in the bladder or urethra during urination. Older women and men are more likely to be tired, shaky, and weak and have muscle aches and abdominal pain. A fever may mean the infection is in your kidneys. Other symptoms of a kidney infection include pain in your back or sides below the ribs, nausea, and vomiting. DIAGNOSIS To diagnose a UTI, your caregiver will ask you about your symptoms. Your caregiver also will ask to provide a urine sample.  The urine sample will be tested for bacteria and white blood cells. White blood cells are made by your body to help fight infection. TREATMENT  Typically, UTIs can be treated with medication. Because most UTIs are caused by a bacterial infection, they usually can be treated with the use of antibiotics. The choice of antibiotic and length of treatment depend on your symptoms and the type of bacteria causing your infection. HOME CARE INSTRUCTIONS  If you were prescribed antibiotics, take them exactly as your caregiver instructs you. Finish the medication even if you feel better after you have only taken some of the medication.  Drink enough water and fluids to keep your urine clear or pale yellow.  Avoid caffeine, tea, and carbonated beverages. They tend to irritate your bladder.  Empty your bladder often. Avoid holding urine for long periods of time.  Empty your bladder before and after sexual intercourse.  After a bowel movement, women should cleanse from front  to back. Use each tissue only once. SEEK MEDICAL CARE IF:   You have back pain.  You develop a fever.  Your symptoms do not begin to resolve within 3 days. SEEK IMMEDIATE MEDICAL CARE IF:   You have severe back pain or lower abdominal pain.  You develop chills.  You have nausea or vomiting.  You have continued burning or discomfort with urination. MAKE SURE YOU:   Understand these instructions.  Will watch your condition.  Will get help right away if you are not doing well or get worse. Document Released: 10/19/2004 Document Revised: 07/11/2011 Document Reviewed: 02/17/2011 [facility] Patient Information 2014 [place].

## 2013-04-06 ENCOUNTER — Emergency Department (HOSPITAL_COMMUNITY): Payer: Self-pay

## 2013-04-06 ENCOUNTER — Emergency Department (HOSPITAL_COMMUNITY)
Admission: EM | Admit: 2013-04-06 | Discharge: 2013-04-06 | Disposition: A | Discharge status: Home / Self Care | Payer: Self-pay | Attending: Emergency Medicine | Admitting: Emergency Medicine

## 2013-04-06 ENCOUNTER — Encounter (HOSPITAL_COMMUNITY): Payer: Self-pay | Admitting: Emergency Medicine

## 2013-04-06 DIAGNOSIS — I10 Essential (primary) hypertension: Secondary | ICD-10-CM | POA: Insufficient documentation

## 2013-04-06 DIAGNOSIS — N390 Urinary tract infection, site not specified: Secondary | ICD-10-CM | POA: Insufficient documentation

## 2013-04-06 DIAGNOSIS — K4090 Unilateral inguinal hernia, without obstruction or gangrene, not specified as recurrent: Secondary | ICD-10-CM | POA: Insufficient documentation

## 2013-04-06 DIAGNOSIS — N452 Orchitis: Secondary | ICD-10-CM

## 2013-04-06 LAB — CBC WITH DIFFERENTIAL/PLATELET
Basophils Absolute: 0 10*3/uL (ref 0.0–0.1)
Basophils Relative: 0 % (ref 0–1)
Eosinophils Absolute: 0.1 10*3/uL (ref 0.0–0.7)
Eosinophils Relative: 1 % (ref 0–5)
HCT: 34.5 % — ABNORMAL LOW (ref 39.0–52.0)
Hemoglobin: 11.7 g/dL — ABNORMAL LOW (ref 13.0–17.0)
Lymphocytes Relative: 13 % (ref 12–46)
Lymphs Abs: 2.1 10*3/uL (ref 0.7–4.0)
MCH: 29.3 pg (ref 26.0–34.0)
MCHC: 33.9 g/dL (ref 30.0–36.0)
MCV: 86.5 fL (ref 78.0–100.0)
Monocytes Absolute: 1.1 10*3/uL — ABNORMAL HIGH (ref 0.1–1.0)
Monocytes Relative: 7 % (ref 3–12)
Neutro Abs: 12.3 10*3/uL — ABNORMAL HIGH (ref 1.7–7.7)
Neutrophils Relative %: 79 % — ABNORMAL HIGH (ref 43–77)
Platelets: 203 10*3/uL (ref 150–400)
RBC: 3.99 MIL/uL — ABNORMAL LOW (ref 4.22–5.81)
RDW: 13.7 % (ref 11.5–15.5)
WBC: 15.5 10*3/uL — ABNORMAL HIGH (ref 4.0–10.5)

## 2013-04-06 LAB — BASIC METABOLIC PANEL
BUN: 14 mg/dL (ref 6–23)
CO2: 25 mEq/L (ref 19–32)
Calcium: 9.2 mg/dL (ref 8.4–10.5)
Chloride: 99 mEq/L (ref 96–112)
Creatinine, Ser: 1.3 mg/dL (ref 0.50–1.35)
GFR calc Af Amer: 70 mL/min — ABNORMAL LOW (ref >90)
GFR calc non Af Amer: 60 mL/min — ABNORMAL LOW (ref >90)
Glucose, Bld: 130 mg/dL — ABNORMAL HIGH (ref 70–99)
Potassium: 3.6 mEq/L — ABNORMAL LOW (ref 3.7–5.3)
Sodium: 137 mEq/L (ref 137–147)

## 2013-04-06 LAB — URINALYSIS, ROUTINE W REFLEX MICROSCOPIC
Bilirubin Urine: NEGATIVE
Glucose, UA: NEGATIVE mg/dL
Hgb urine dipstick: NEGATIVE
Ketones, ur: NEGATIVE mg/dL
Leukocytes, UA: NEGATIVE
Nitrite: NEGATIVE
Protein, ur: NEGATIVE mg/dL
Specific Gravity, Urine: >1.046 — ABNORMAL HIGH (ref 1.005–1.030)
Urobilinogen, UA: 4 mg/dL — ABNORMAL HIGH (ref 0.0–1.0)
pH: 5 (ref 5.0–8.0)

## 2013-04-06 LAB — URINE CULTURE: Colony Count: >=100000

## 2013-04-06 MED ORDER — DEXTROSE 5 % IV SOLN
1.0000 g | Freq: Once | INTRAVENOUS | Status: AC
  Administered 2013-04-06: 1 g via INTRAVENOUS
  Filled 2013-04-06: qty 10

## 2013-04-06 MED ORDER — OXYCODONE-ACETAMINOPHEN 5-325 MG PO TABS: ORAL_TABLET | ORAL | Status: DC

## 2013-04-06 MED ORDER — CIPROFLOXACIN HCL 500 MG PO TABS: 500.0000 mg | ORAL_TABLET | Freq: Two times a day (BID) | ORAL | Status: DC

## 2013-04-06 MED ORDER — HYDROMORPHONE HCL PF 1 MG/ML IJ SOLN
1.0000 mg | Freq: Once | INTRAMUSCULAR | Status: AC
  Administered 2013-04-06: 1 mg via INTRAVENOUS
  Filled 2013-04-06: qty 1

## 2013-04-06 MED ORDER — OXYCODONE-ACETAMINOPHEN 5-325 MG PO TABS: 2.0000 | ORAL_TABLET | Freq: Once | ORAL | Status: DC

## 2013-04-06 MED ORDER — SODIUM CHLORIDE 0.9 % IV SOLN
INTRAVENOUS | Status: DC
Start: 2013-04-06 — End: 2013-04-06
  Administered 2013-04-06: 10:00:00 via INTRAVENOUS

## 2013-04-06 MED ORDER — IOHEXOL 300 MG/ML  SOLN
50.0000 mL | Freq: Once | INTRAMUSCULAR | Status: AC | PRN
  Administered 2013-04-06: 50 mL via ORAL

## 2013-04-06 MED ORDER — IOHEXOL 300 MG/ML  SOLN
100.0000 mL | Freq: Once | INTRAMUSCULAR | Status: AC | PRN
  Administered 2013-04-06: 100 mL via INTRAVENOUS

## 2013-04-06 MED ORDER — NAPROXEN 500 MG PO TABS: 500.0000 mg | ORAL_TABLET | Freq: Two times a day (BID) | ORAL | Status: DC

## 2013-04-06 MED ORDER — ONDANSETRON HCL 4 MG/2ML IJ SOLN
4.0000 mg | Freq: Once | INTRAMUSCULAR | Status: AC
  Administered 2013-04-06: 4 mg via INTRAVENOUS
  Filled 2013-04-06: qty 2

## 2013-04-06 NOTE — Discharge Instructions (Signed)
Try ice and heat for pain. Take the percocet with the naprosyn for pain. Take the antibiotics until gone. Wear an athletic support for comfort. You can be rechecked at [facility] if not improving in the next 48 hrs. If you feel worse, you can return to the ED and be prepared to be admitted.

## 2013-04-06 NOTE — ED Notes (Signed)
He c/o massive groin/scrotal swelling which has occurred since Fri. (2 days).  He theorizes it is a hernia "and I want it taken out today".  He denies fever/n/v/d and is in no distress.  He states "It is so big I can't bend over to tie my shoes".

## 2013-04-06 NOTE — ED Provider Notes (Signed)
CSN: [id number]     Arrival date & time 04/06/13  0827 History   First MD Initiated Contact with Patient 04/06/13 [phone]     Chief Complaint  Patient presents with  . Testicle Pain    groin     (Consider location/radiation/quality/duration/timing/severity/associated sxs/prior Treatment) HPI Patient reports he started having pain in his left scrotum Thursday. He was seen in the ED on the 13th and diagnosed with a inguinal hernia with fat tissue without intestinal tissue. He was diagnosed with a UTI and sent home with antibiotics and pain medication. Patient reports the next day the swelling started getting bigger and has continued to get as big as "a basketball". He denies headache changes size is that it only gets bigger. It does not get smaller. He denies any nausea, vomiting, diarrhea, constipation, or any difficulty urinating. He denies doing any type of heavy lifting or any other type of injury. He states he's never had this happen before.  PCP none  Past Medical History  Diagnosis Date  . Hypertension    Past Surgical History  Procedure Laterality Date  . Knee surgery      x3, x1 R  . Tonsillectomy    . Appendectomy    . Hand surgery    . Eye surgery     No family history on file. History  Substance Use Topics  . Smoking status: Never Smoker   . Smokeless tobacco: Never Used  . Alcohol Use: Yes     Comment: socially   employed  Review of Systems  All other systems reviewed and are negative.      Allergies  Review of patient's allergies indicates no known allergies.  Home Medications   Current Outpatient Rx  Name  Route  Sig  Dispense  Refill  . cephALEXin (KEFLEX) 500 MG capsule   Oral   Take 1 capsule (500 mg total) by mouth 2 (two) times daily.   20 capsule   0   . HYDROcodone-acetaminophen (NORCO/VICODIN) 5-325 MG per tablet   Oral   Take 1 tablet by mouth every 6 (six) hours as needed.   15 tablet   0   . ondansetron (ZOFRAN ODT) 8 MG  disintegrating tablet   Oral   Take 1 tablet (8 mg total) by mouth every 8 (eight) hours as needed for nausea.   20 tablet   0    BP 138/80  Pulse 89  Temp(Src) 98.4 F (36.9 C) (Oral)  Resp 20  SpO2 98%  Vital signs normal   Physical Exam  Nursing note and vitals reviewed. Constitutional: He is oriented to person, place, and time. He appears well-developed and well-nourished.  Non-toxic appearance. He does not appear ill. He appears distressed.  HENT:  Head: Normocephalic and atraumatic.  Right Ear: External ear normal.  Left Ear: External ear normal.  Nose: Nose normal. No mucosal edema or rhinorrhea.  Mouth/Throat: Oropharynx is clear and moist and mucous membranes are normal. No dental abscesses or uvula swelling.  Eyes: Conjunctivae and EOM are normal. Pupils are equal, round, and reactive to light.  Neck: Normal range of motion and full passive range of motion without pain. Neck supple.  Cardiovascular: Normal rate, regular rhythm and normal heart sounds.  Exam reveals no gallop and no friction rub.   No murmur heard. Pulmonary/Chest: Effort normal and breath sounds normal. No respiratory distress. He has no wheezes. He has no rhonchi. He has no rales. He exhibits no tenderness and no crepitus.  Abdominal: Soft. Normal appearance and bowel sounds are normal. He exhibits no distension. There is no tenderness. There is no rebound and no guarding.  Genitourinary:  Patient has obvious swelling of his scrotum. His right testicle feels normal however in his left testicle area it feels very enlarged. His scrotum is about the size of a mango. He has no pain in his inguinal region and no masses felt in the inguinal area. His left scrotum is extremely tender to palpation.  Musculoskeletal: Normal range of motion. He exhibits no edema and no tenderness.  Moves all extremities well.   Neurological: He is alert and oriented to person, place, and time. He has normal strength. No cranial  nerve deficit.  Skin: Skin is warm, dry and intact. No rash noted. No erythema. No pallor.  Psychiatric: He has a normal mood and affect. His speech is normal and behavior is normal. His mood appears not anxious.    ED Course  Procedures (including critical care time)  Medications  0.9 %  sodium chloride infusion ( Intravenous Stopped 04/06/13 1100)  oxyCODONE-acetaminophen (PERCOCET/ROXICET) 5-325 MG per tablet 2 tablet (not administered)  HYDROmorphone (DILAUDID) injection 1 mg (1 mg Intravenous Given 04/06/13 0946)  ondansetron (ZOFRAN) injection 4 mg (4 mg Intravenous Given 04/06/13 0945)  iohexol (OMNIPAQUE) 300 MG/ML solution 50 mL (50 mLs Oral Contrast Given 04/06/13 0952)  iohexol (OMNIPAQUE) 300 MG/ML solution 100 mL (100 mLs Intravenous Contrast Given 04/06/13 1048)  cefTRIAXone (ROCEPHIN) 1 g in dextrose 5 % 50 mL IVPB (1 g Intravenous New Bag/Given 04/06/13 1309)    12:00 she given his test results. The etiology of his discomfort is still not clear. Patient states his pain is better but still not gone. He still has a lot of tenderness to palpation even by himself. We discussed discussing his test results with the urologist for further guidance on his treatment. Patient is agreeable, he is also agreeable if he needs to be admitted.  12:52 patient discussed with Dr. Risa Grill, Urology. Reviewed his ultrasound and CT reports. Patient's urine culture from the 13th is growing more than 100,000 Escherichia coli sensitivities are pending. He feels patient may have orchitis/epididymitis with reactive scrotal edema. He suggests giving IV Rocephin and changing antibiotics to Cipro pending urine culture results.. He states patient could be treated as outpatient or inpatient depending on how the patient is feeling.  13:30 PT given option to try outpatient management or to be admitted, he wants to try to go home. He was advised about hot/col therapy, wearing an athletic supporter, and the change in his  antibiotics.   Labs Review Results for orders placed during the hospital encounter of 04/06/13  CBC WITH DIFFERENTIAL      Result Value Ref Range   WBC 15.5 (*) 4.0 - 10.5 K/uL   RBC 3.99 (*) 4.22 - 5.81 MIL/uL   Hemoglobin 11.7 (*) 13.0 - 17.0 g/dL   HCT 34.5 (*) 39.0 - 52.0 %   MCV 86.5  78.0 - 100.0 fL   MCH 29.3  26.0 - 34.0 pg   MCHC 33.9  30.0 - 36.0 g/dL   RDW 13.7  11.5 - 15.5 %   Platelets 203  150 - 400 K/uL   Neutrophils Relative % 79 (*) 43 - 77 %   Neutro Abs 12.3 (*) 1.7 - 7.7 K/uL   Lymphocytes Relative 13  12 - 46 %   Lymphs Abs 2.1  0.7 - 4.0 K/uL   Monocytes Relative 7  3 -  12 %   Monocytes Absolute 1.1 (*) 0.1 - 1.0 K/uL   Eosinophils Relative 1  0 - 5 %   Eosinophils Absolute 0.1  0.0 - 0.7 K/uL   Basophils Relative 0  0 - 1 %   Basophils Absolute 0.0  0.0 - 0.1 K/uL  BASIC METABOLIC PANEL      Result Value Ref Range   Sodium 137  137 - 147 mEq/L   Potassium 3.6 (*) 3.7 - 5.3 mEq/L   Chloride 99  96 - 112 mEq/L   CO2 25  19 - 32 mEq/L   Glucose, Bld 130 (*) 70 - 99 mg/dL   BUN 14  6 - 23 mg/dL   Creatinine, Ser 1.30  0.50 - 1.35 mg/dL   Calcium 9.2  8.4 - 10.5 mg/dL   GFR calc non Af Amer 60 (*) >90 mL/min   GFR calc Af Amer 70 (*) >90 mL/min   Laboratory interpretation all normal except for leukocytosis, mild anemia, mild hypokalemia   Imaging Review US Scrotum  US Art/ven Flow Abd Pelv Doppler  04/06/2013   CLINICAL DATA:  Acute onset left-sided scrotal pain.  EXAM: SCROTAL ULTRASOUND  DOPPLER ULTRASOUND OF THE TESTICLES  TECHNIQUE: Complete ultrasound examination of the testicles, epididymis, and other scrotal structures was performed. Color and spectral Doppler ultrasound were also utilized to evaluate blood flow to the testicles.  COMPARISON:  None.  FINDINGS: Right testicle  Measurements: 4.3 x 2.2 x 2.9 cm. No mass or microlithiasis visualized.  Left testicle  Measurements: 4.3 x 2.4 x 2.9 cm. No mass or microlithiasis visualized.  Right  epididymis: 4 mm cyst noted and epididymal head. Otherwise normal appearance.  Left epididymis:  Normal in size and appearance.  Hydrocele: Moderate complex hydrocele noted on the left as well as diffuse left-sided scrotal skin thickening.  Varicocele:  None visualized.  Other: In addition, there is hyperechoic fat attenuation soft tissue density in the left supra testicular region along the left inguinal canal which is suspicious for a fat containing hernia.  Pulsed Doppler interrogation of both testes demonstrates low resistance arterial and venous waveforms bilaterally.  IMPRESSION: No evidence of testicular mass or torsion.  Diffuse left scrotal wall skin thickening, which could be related to edema or cellulitis. Moderate complex left hydrocele also noted, which may be reactive.  Suspect fat containing left inguinal hernia.   Electronically Signed   By: Earle Gell M.D.   On: 04/06/2013 11:12   Ct Abdomen Pelvis W Contrast  04/06/2013   CLINICAL DATA:  Left testicular tenderness for 2 days. Left-sided hernia.  EXAM: CT ABDOMEN AND PELVIS WITH CONTRAST  TECHNIQUE: Multidetector CT imaging of the abdomen and pelvis was performed using the standard protocol following bolus administration of intravenous contrast.  CONTRAST:  58mL OMNIPAQUE IOHEXOL 300 MG/ML SOLN, 167mL OMNIPAQUE IOHEXOL 300 MG/ML SOLN  COMPARISON:  US SCROTUM dated 04/06/2013; US SCROTUM dated 04/04/2013  FINDINGS: Lower Chest: Clear lung bases. Mild cardiomegaly. No pericardial or pleural effusion. Bilateral gynecomastia.  Abdomen/Pelvis: Normal liver, spleen, stomach. For inferior uncinate process low-density pancreatic lesion measures 7 mm on image 40/series 2. Normal gallbladder, biliary tract, adrenal glands.  Interpolar right renal cyst of 4.7 cm. Normal left kidney. No retroperitoneal or retrocrural adenopathy. Normal colon and terminal ileum. Prior appendectomy. Normal small bowel without abdominal ascites. No pelvic adenopathy.  Normal  urinary bladder and prostate. No significant free fluid. No evidence of inguinal hernia. There is a small left hydrocele.  Bones/Musculoskeletal:  No acute osseous abnormality.  IMPRESSION: 1. No evidence of inguinal hernia or other explanation for left-sided pain. 2. Small left hydrocele. 3. Tiny cystic lesion within the pancreatic uncinate process. Likely a small pseudocyst. Indolent neoplasm such as intraductal papillary mucinous tumor could look similar. Per consensus criteria, follow-up with pre and post contrast abdominal MRI at 1 year is recommended. This recommendation follows ACR consensus guidelines: Managing Incidental Findings on Abdominal CT: White Paper of the ACR Incidental Findings Committee. J Am Coll Radiol 2010;7:754-773. 4. Bilateral gynecomastia.   Electronically Signed   By: Abigail Miyamoto M.D.   On: 04/06/2013 11:15        US Scrotum  US Art/ven Flow Abd Pelv Doppler  04/04/2013   CLINICAL DATA:  Left scrotal swelling and pain.  EXAM: SCROTAL ULTRASOUND  DOPPLER ULTRASOUND OF THE TESTICLES  TECHNIQUE: Complete ultrasound examination of the testicles, epididymis, and other scrotal structures was performed. Color and spectral Doppler ultrasound were also utilized to evaluate blood flow to the testicles.  COMPARISON:  None.  FINDINGS: Right testicle  Measurements: 4.0 x 1.9 x 2.9 cm. No mass or microlithiasis visualized.  Left testicle  Measurements: 4.0 x 1.4 x 2.9 cm. No mass or microlithiasis visualized.  Right epididymis:  3 mm cyst.  Left epididymis:  Normal in size and appearance.  Hydrocele:  None visualized.  Varicocele:  Right greater than left varicoceles.  Pulsed Doppler interrogation of both testes demonstrates low resistance arterial and venous waveforms bilaterally.  Left greater than right fat containing inguinal hernias. No definite bowel visualized.  IMPRESSION: Color Doppler flow with arterial and venous waveforms documented to each testicle.  Left greater than right fat  containing inguinal hernias. No definite herniation of bowel. Correlate with CT if further characterization is clinically warranted.  Small right greater than left varicoceles.   Electronically Signed   By: Carlos Levering M.D.   On: 04/04/2013 04:46      EKG Interpretation None      MDM   Final diagnoses:  UTI (urinary tract infection)  Orchitis    New Prescriptions   CIPROFLOXACIN (CIPRO) 500 MG TABLET    Take 1 tablet (500 mg total) by mouth 2 (two) times daily.   NAPROXEN (NAPROSYN) 500 MG TABLET    Take 1 tablet (500 mg total) by mouth 2 (two) times daily.   OXYCODONE-ACETAMINOPHEN (PERCOCET/ROXICET) 5-325 MG PER TABLET    Take 1 or 2 po Q 6hrs for pain    Plan discharge   Rolland Porter, MD, Alanson Aly, MD 04/06/13 [phone]

## 2013-04-06 NOTE — ED Notes (Signed)
Pt states he has hernia the size of a basketball in his groin area. Pt states he wants it out. Was here on Thur/Fri for same reason

## 2013-04-06 NOTE — ED Notes (Signed)
Upon inspection, his groin and scrotum are normal in appearance.  His left testicle is exquisitely painful to palpation.

## 2013-04-07 NOTE — ED Notes (Signed)
+   Urine Patient treated with Cephalexin ok per Renne Crigler

## 2013-07-27 ENCOUNTER — Emergency Department (HOSPITAL_COMMUNITY)
Admission: EM | Admit: 2013-07-27 | Discharge: 2013-07-27 | Disposition: A | Discharge status: Home / Self Care | Payer: Self-pay | Attending: Emergency Medicine | Admitting: Emergency Medicine

## 2013-07-27 ENCOUNTER — Encounter (HOSPITAL_COMMUNITY): Payer: Self-pay | Admitting: Emergency Medicine

## 2013-07-27 ENCOUNTER — Telehealth (HOSPITAL_BASED_OUTPATIENT_CLINIC_OR_DEPARTMENT_OTHER): Payer: Self-pay

## 2013-07-27 DIAGNOSIS — I10 Essential (primary) hypertension: Secondary | ICD-10-CM | POA: Insufficient documentation

## 2013-07-27 DIAGNOSIS — R109 Unspecified abdominal pain: Secondary | ICD-10-CM | POA: Insufficient documentation

## 2013-07-27 DIAGNOSIS — M545 Low back pain, unspecified: Secondary | ICD-10-CM | POA: Insufficient documentation

## 2013-07-27 DIAGNOSIS — R030 Elevated blood-pressure reading, without diagnosis of hypertension: Secondary | ICD-10-CM

## 2013-07-27 DIAGNOSIS — Z791 Long term (current) use of non-steroidal anti-inflammatories (NSAID): Secondary | ICD-10-CM | POA: Insufficient documentation

## 2013-07-27 DIAGNOSIS — N489 Disorder of penis, unspecified: Secondary | ICD-10-CM | POA: Insufficient documentation

## 2013-07-27 LAB — URINALYSIS, ROUTINE W REFLEX MICROSCOPIC
Bilirubin Urine: NEGATIVE
Glucose, UA: NEGATIVE mg/dL
Hgb urine dipstick: NEGATIVE
Ketones, ur: NEGATIVE mg/dL
Leukocytes, UA: NEGATIVE
Nitrite: NEGATIVE
Protein, ur: NEGATIVE mg/dL
Specific Gravity, Urine: 1.023 (ref 1.005–1.030)
Urobilinogen, UA: 1 mg/dL (ref 0.0–1.0)
pH: 5 (ref 5.0–8.0)

## 2013-07-27 MED ORDER — NAPROXEN 500 MG PO TABS: 500.0000 mg | ORAL_TABLET | Freq: Two times a day (BID) | ORAL | Status: DC

## 2013-07-27 NOTE — Telephone Encounter (Signed)
Pt calling stating he doesn't know why ED PA gave him Rx for back pain and the pharmacist told him he needed Rx for an antibiotic(for UTI) and pt calling to get new Rx for abx.  Chart reviewed and pt informed Urinalysis done today showed no abnormalities and that was why pt not given abx Rx.  Pt informed to f/u w/PCP or return if he continues to have problems.

## 2013-07-27 NOTE — Discharge Instructions (Signed)
Take naproxen as directed as needed for pain. Followup with one of the resources below to establish care with a primary care physician.  Back Pain, Adult Low back pain is very common. About 1 in 5 people have back pain.The cause of low back pain is rarely dangerous. The pain often gets better over time.About half of people with a sudden onset of back pain feel better in just 2 weeks. About 8 in 10 people feel better by 6 weeks.  CAUSES Some common causes of back pain include:  Strain of the muscles or ligaments supporting the spine.  Wear and tear (degeneration) of the spinal discs.  Arthritis.  Direct injury to the back. DIAGNOSIS Most of the time, the direct cause of low back pain is not known.However, back pain can be treated effectively even when the exact cause of the pain is unknown.Answering your caregiver's questions about your overall health and symptoms is one of the most accurate ways to make sure the cause of your pain is not dangerous. If your caregiver needs more information, he or she may order lab work or imaging tests (X-rays or MRIs).However, even if imaging tests show changes in your back, this usually does not require surgery. HOME CARE INSTRUCTIONS For many people, back pain returns.Since low back pain is rarely dangerous, it is often a condition that people can learn to [facility] their own.   Remain active. It is stressful on the back to sit or stand in one place. Do not sit, drive, or stand in one place for more than 30 minutes at a time. Take short walks on level surfaces as soon as pain allows.Try to increase the length of time you walk each day.  Do not stay in bed.Resting more than 1 or 2 days can delay your recovery.  Do not avoid exercise or work.Your body is made to move.It is not dangerous to be active, even though your back may hurt.Your back will likely heal faster if you return to being active before your pain is gone.  Pay attention to your body  when you bend and lift. Many people have less discomfortwhen lifting if they bend their knees, keep the load close to their bodies,and avoid twisting. Often, the most comfortable positions are those that put less stress on your recovering back.  Find a comfortable position to sleep. Use a firm mattress and lie on your side with your knees slightly bent. If you lie on your back, put a pillow under your knees.  Only take over-the-counter or prescription medicines as directed by your caregiver. Over-the-counter medicines to reduce pain and inflammation are often the most helpful.Your caregiver may prescribe muscle relaxant drugs.These medicines help dull your pain so you can more quickly return to your normal activities and healthy exercise.  Put ice on the injured area.  Put ice in a plastic bag.  Place a towel between your skin and the bag.  Leave the ice on for 15-20 minutes, 03-04 times a day for the first 2 to 3 days. After that, ice and heat may be alternated to reduce pain and spasms.  Ask your caregiver about trying back exercises and gentle massage. This may be of some benefit.  Avoid feeling anxious or stressed.Stress increases muscle tension and can worsen back pain.It is important to recognize when you are anxious or stressed and learn ways to manage it.Exercise is a great option. SEEK MEDICAL CARE IF:  You have pain that is not relieved with rest or medicine.  You have pain that does not improve in 1 week.  You have new symptoms.  You are generally not feeling well. SEEK IMMEDIATE MEDICAL CARE IF:   You have pain that radiates from your back into your legs.  You develop new bowel or bladder control problems.  You have unusual weakness or numbness in your arms or legs.  You develop nausea or vomiting.  You develop abdominal pain.  You feel faint. Document Released: 01/09/2005 Document Revised: 07/11/2011 Document Reviewed: 05/30/2010 [facility] Patient  Information 2015 [place], [place]. This information is not intended to replace advice given to you by your health care provider. Make sure you discuss any questions you have with your health care provider. RESOURCE GUIDE  Chronic Pain Problems: Contact [facility]  [phone] Patients need to be referred by their primary care doctor.  Insufficient Money for Medicine: Contact [employer]:  call "211."   No Primary Care Doctor: - Call [facility]  [phone] - can help you locate a primary care doctor that  accepts your insurance, provides certain services, etc. - Physician Referral Service- [phone]  Agencies that provide inexpensive medical care: - [facility]  [phone] - [facility]  [phone] - [facility]  [phone] - [facility]  [phone] - [facility]  [phone] - [facility]  [phone]  [place] Providers: - [facility]- [address], [address]  [phone], Mon-Fri 9am-7pm, Sat 9am-1pm - [place], [place], [address]  [phone] [facility]- [address]  [place], [address], [phone]  Only accepts [place] [facility] [place] patients after they have their name  applied to their card  Self Pay (no insurance) in [place]: - Sickle Cell Patients: Dr Kevan Ny, [facility]  [place], [facility]- [place]  [facility]- [address], [facility]- see information above (Speak to [employer] if you do not have insurance)       -  [facility]- [place],  [place], [place]  Dr Vista Lawman-  [address], [place], [place], [place]       -  [facility] and [facility] - [address], [phone]       -  [facility]- [address], [place], also [address] [phone]       -    [facility]- [address], [place], 1st & 3rd Saturday        every month, 10am-1pm  1) Find a Doctor and Pay Out of Pocket Although you won't have to find out who is covered by your insurance plan, it is a good idea to ask around and get recommendations. You will then need to call the office and see if the doctor you have chosen will accept you as a new patient and what types of options they offer for patients who  are self-pay. Some doctors offer discounts or will set up payment plans for their patients who do not have insurance, but you will need to ask so you aren't surprised when you get to your appointment.  2) Contact Your Local Health Department Not all health departments have doctors that can see patients for sick visits, but many do, so it is worth a call to see if yours does. If you don't know where your local health department is, you can check in your phone book. The CDC also has a tool to help you locate your state's health department, and many state websites also have listings of all of their local health departments.  3) Find a [facility] If your illness is not likely to be very severe or complicated, you may want to try a walk in clinic. These are popping up all over the country in pharmacies, drugstores, and shopping centers. They're usually staffed by nurse practitioners or physician assistants that have been trained to treat common illnesses and complaints. They're usually fairly quick and inexpensive. However, if you have serious medical issues or chronic medical problems, these are probably not your best option

## 2013-07-27 NOTE — ED Provider Notes (Signed)
CSN: [id number]     Arrival date & time 07/27/13  8921 History   First MD Initiated Contact with Patient 07/27/13 0740     Chief Complaint  Patient presents with  . Back Pain     (Consider location/radiation/quality/duration/timing/severity/associated sxs/prior Treatment) HPI Comments: 58 year old male past medical history of hypertension presents to the emergency department complaining of back pain over the past couple of weeks worsening over the past few days. No known injury or trauma. Patient states "my kidneys hurt". States this feels similar to when he had a urinary tract infection in the past. He was diagnosed with urinary tract infection in March, prescribed Keflex. He states he had left over Keflex and tried taking it yesterday and today without any relief of his symptoms. States when he sits down the pain radiates around his sides to his groin area and penis. Denies testicular pain or swelling, penile discharge, increased urinary frequency, urgency or dysuria. Admits to being sexually active, however has not had intercourse "for a long time". States he is not concerned about any sexually transmitted diseases. Denies fever, chills, nausea, vomiting, pain, numbness or tingling radiating down his extremities, loss of control bowels or bladder or saddle anesthesia.  Patient is a 58 y.o. male presenting with back pain. The history is provided by the patient.  Back Pain   Past Medical History  Diagnosis Date  . Hypertension    Past Surgical History  Procedure Laterality Date  . Knee surgery      x3, x1 R  . Tonsillectomy    . Appendectomy    . Hand surgery    . Eye surgery     No family history on file. History  Substance Use Topics  . Smoking status: Never Smoker   . Smokeless tobacco: Never Used  . Alcohol Use: Yes     Comment: socially    Review of Systems  Genitourinary: Positive for flank pain and penile pain.  Musculoskeletal: Positive for back pain.  All other  systems reviewed and are negative.     Allergies  Review of patient's allergies indicates no known allergies.  Home Medications   Prior to Admission medications   Medication Sig Start Date End Date Taking? Authorizing Provider  naproxen (NAPROSYN) 500 MG tablet Take 1 tablet (500 mg total) by mouth 2 (two) times daily. 07/27/13   Illene Labrador, PA-C   BP 158/101  Pulse 63  Temp(Src) 98 F (36.7 C) (Oral)  Resp 20  SpO2 98% Physical Exam  Nursing note and vitals reviewed. Constitutional: He is oriented to person, place, and time. He appears well-developed and well-nourished. No distress.  HENT:  Head: Normocephalic and atraumatic.  Mouth/Throat: Oropharynx is clear and moist.  Eyes: Conjunctivae are normal.  Neck: Normal range of motion. Neck supple. No spinous process tenderness and no muscular tenderness present.  Cardiovascular: Normal rate, regular rhythm and normal heart sounds.   Pulmonary/Chest: Effort normal and breath sounds normal. No respiratory distress.  Abdominal: Soft. Bowel sounds are normal. There is no tenderness. Hernia confirmed negative in the right inguinal area and confirmed negative in the left inguinal area.  Genitourinary: Testes normal and penis normal. Right testis shows no mass, no swelling and no tenderness. Left testis shows no mass, no swelling and no tenderness. No penile erythema or penile tenderness. No discharge found.  Musculoskeletal: Normal range of motion. He exhibits no edema.  Minimal tenderness across lower back. No spinous process tenderness.  Lymphadenopathy:  Right: No inguinal adenopathy present.       Left: No inguinal adenopathy present.  Neurological: He is alert and oriented to person, place, and time. He has normal strength.  Strength lower extremities 5/5 and equal bilateral. Sensation intact. Normal gait.  Skin: Skin is warm and dry. No rash noted. He is not diaphoretic.  Psychiatric: He has a normal mood and affect.  His behavior is normal.    ED Course  Procedures (including critical care time) Labs Review Labs Reviewed  URINALYSIS, ROUTINE W REFLEX MICROSCOPIC    Imaging Review No results found.   EKG Interpretation None      MDM   Final diagnoses:  Bilateral low back pain without sciatica  Diastolic blood pressure 90 mm Hg or higher   Patient presenting with low-back pain. He is well appearing and in no apparent distress. Afebrile, hypertensive, vitals otherwise stable. No red flags concerning patient's back pain. No s/s of central cord compression or cauda equina. Lower extremities are neurovascularly intact and patient is ambulating without difficulty. Genital exam normal. Urinalysis negative. Advised him to take NSAIDs. Discussed conservative measures. Stable for discharge. Return precautions given. Patient states understanding of treatment care plan and is agreeable.  9:14 AM At discharge, blood pressure 177/106. Patient is denying chest pain, shortness of breath, vision changes, headache, numbness, weakness or tingling. States he has never been diagnosed with high blood pressure. He states "I will be fine". I discussed importance of establishing care with a primary care physician. Advised him to followup at the wellness Center or: Of the resources in the resource guide given. Stable for discharge, and discussed return precautions in detail, patient understanding and agreeable.  Illene Labrador, PA-C 07/27/13 [place], PA-C 07/27/13 1610  Illene Labrador, PA-C 07/27/13 [phone]

## 2013-07-27 NOTE — ED Notes (Addendum)
Pt c/o mid back pain x a couple of weeks, States " its right at my kidneys". Denies urinary problems or injury.

## 2013-07-27 NOTE — ED Provider Notes (Signed)
Medical screening examination/treatment/procedure(s) were performed by non-physician practitioner and as supervising physician I was immediately available for consultation/collaboration.   EKG Interpretation None        Houston Siren III, MD 07/27/13 [phone]

## 2013-07-28 ENCOUNTER — Encounter (HOSPITAL_COMMUNITY): Payer: Self-pay | Admitting: Emergency Medicine

## 2013-07-28 ENCOUNTER — Emergency Department (HOSPITAL_COMMUNITY)
Admission: EM | Admit: 2013-07-28 | Discharge: 2013-07-28 | Disposition: A | Discharge status: Home / Self Care | Payer: Self-pay | Attending: Emergency Medicine | Admitting: Emergency Medicine

## 2013-07-28 DIAGNOSIS — Z791 Long term (current) use of non-steroidal anti-inflammatories (NSAID): Secondary | ICD-10-CM | POA: Insufficient documentation

## 2013-07-28 DIAGNOSIS — N489 Disorder of penis, unspecified: Secondary | ICD-10-CM | POA: Insufficient documentation

## 2013-07-28 DIAGNOSIS — Z79899 Other long term (current) drug therapy: Secondary | ICD-10-CM | POA: Insufficient documentation

## 2013-07-28 DIAGNOSIS — Z792 Long term (current) use of antibiotics: Secondary | ICD-10-CM | POA: Insufficient documentation

## 2013-07-28 DIAGNOSIS — I10 Essential (primary) hypertension: Secondary | ICD-10-CM | POA: Insufficient documentation

## 2013-07-28 LAB — URINALYSIS, ROUTINE W REFLEX MICROSCOPIC
Bilirubin Urine: NEGATIVE
Glucose, UA: NEGATIVE mg/dL
Hgb urine dipstick: NEGATIVE
Ketones, ur: NEGATIVE mg/dL
Leukocytes, UA: NEGATIVE
Nitrite: NEGATIVE
Protein, ur: NEGATIVE mg/dL
Specific Gravity, Urine: 1.027 (ref 1.005–1.030)
Urobilinogen, UA: 0.2 mg/dL (ref 0.0–1.0)
pH: 5 (ref 5.0–8.0)

## 2013-07-28 LAB — CBC
HCT: 38.9 % — ABNORMAL LOW (ref 39.0–52.0)
Hemoglobin: 12.9 g/dL — ABNORMAL LOW (ref 13.0–17.0)
MCH: 29.2 pg (ref 26.0–34.0)
MCHC: 33.2 g/dL (ref 30.0–36.0)
MCV: 88 fL (ref 78.0–100.0)
Platelets: 186 10*3/uL (ref 150–400)
RBC: 4.42 MIL/uL (ref 4.22–5.81)
RDW: 13.8 % (ref 11.5–15.5)
WBC: 8.4 10*3/uL (ref 4.0–10.5)

## 2013-07-28 LAB — BASIC METABOLIC PANEL
Anion gap: 12 (ref 5–15)
BUN: 12 mg/dL (ref 6–23)
CO2: 25 mEq/L (ref 19–32)
Calcium: 9.8 mg/dL (ref 8.4–10.5)
Chloride: 106 mEq/L (ref 96–112)
Creatinine, Ser: 1.17 mg/dL (ref 0.50–1.35)
GFR calc Af Amer: 79 mL/min — ABNORMAL LOW (ref >90)
GFR calc non Af Amer: 69 mL/min — ABNORMAL LOW (ref >90)
Glucose, Bld: 91 mg/dL (ref 70–99)
Potassium: 4.3 mEq/L (ref 3.7–5.3)
Sodium: 143 mEq/L (ref 137–147)

## 2013-07-28 MED ORDER — LISINOPRIL 5 MG PO TABS: 5.0000 mg | ORAL_TABLET | Freq: Every day | ORAL | Status: DC

## 2013-07-28 MED ORDER — CIPROFLOXACIN HCL 500 MG PO TABS: 500.0000 mg | ORAL_TABLET | Freq: Two times a day (BID) | ORAL | Status: DC

## 2013-07-28 NOTE — ED Provider Notes (Signed)
CSN: [id number]     Arrival date & time 07/28/13  1731 History   First MD Initiated Contact with Patient 07/28/13 1858     Chief Complaint  Patient presents with  . Hypertension     (Consider location/radiation/quality/duration/timing/severity/associated sxs/prior Treatment) HPI Comments: Patient presents to the ER for evaluation of elevated blood pressure. Patient reports that he was seen yesterday for elevated blood pressure. Patient reports that he does not normally have elevated blood pressure, but his blood pressure does go up when he has an infection. He reports that the last time this happened he had an infection in his urine and was treated with Cipro. Patient reports that he has pain at the tip of his penis, but no drainage. It does not hurt when he urinates. No testicular pain or swelling. He is not experiencing chest pain, shortness of breath, heart palpitations, headache, blurred vision.  Patient is a 58 y.o. male presenting with hypertension.  Hypertension    Past Medical History  Diagnosis Date  . Hypertension    Past Surgical History  Procedure Laterality Date  . Knee surgery      x3, x1 R  . Tonsillectomy    . Appendectomy    . Hand surgery    . Eye surgery     History reviewed. No pertinent family history. History  Substance Use Topics  . Smoking status: Never Smoker   . Smokeless tobacco: Never Used  . Alcohol Use: Yes     Comment: socially    Review of Systems  Genitourinary: Positive for penile pain.  All other systems reviewed and are negative.     Allergies  Review of patient's allergies indicates no known allergies.  Home Medications   Prior to Admission medications   Medication Sig Start Date End Date Taking? Authorizing Provider  ciprofloxacin (CIPRO) 500 MG tablet Take 1 tablet (500 mg total) by mouth 2 (two) times daily. 07/28/13   Orpah Greek, MD  lisinopril (PRINIVIL,ZESTRIL) 5 MG tablet Take 1 tablet (5 mg total) by mouth  daily. 07/28/13   Orpah Greek, MD  naproxen (NAPROSYN) 500 MG tablet Take 1 tablet (500 mg total) by mouth 2 (two) times daily. 07/27/13   Illene Labrador, PA-C   BP 212/126  Pulse 58  Temp(Src) 98 F (36.7 C) (Oral)  Resp 16  SpO2 100% Physical Exam  Constitutional: He is oriented to person, place, and time. He appears well-developed and well-nourished. No distress.  HENT:  Head: Normocephalic and atraumatic.  Right Ear: Hearing normal.  Left Ear: Hearing normal.  Nose: Nose normal.  Mouth/Throat: Oropharynx is clear and moist and mucous membranes are normal.  Eyes: Conjunctivae and EOM are normal. Pupils are equal, round, and reactive to light.  Neck: Normal range of motion. Neck supple.  Cardiovascular: Regular rhythm, S1 normal and S2 normal.  Exam reveals no gallop and no friction rub.   No murmur heard. Pulmonary/Chest: Effort normal and breath sounds normal. No respiratory distress. He exhibits no tenderness.  Abdominal: Soft. Normal appearance and bowel sounds are normal. There is no hepatosplenomegaly. There is no tenderness. There is no rebound, no guarding, no tenderness at McBurney's point and negative Murphy's sign. No hernia. Hernia confirmed negative in the right inguinal area and confirmed negative in the left inguinal area.  Genitourinary: Testes normal and penis normal. Right testis shows no mass and no tenderness. Left testis shows no mass and no tenderness. Uncircumcised. No phimosis, paraphimosis or penile erythema. No discharge  found.  Musculoskeletal: Normal range of motion.  Lymphadenopathy:       Right: No inguinal adenopathy present.       Left: No inguinal adenopathy present.  Neurological: He is alert and oriented to person, place, and time. He has normal strength. No cranial nerve deficit or sensory deficit. Coordination normal. GCS eye subscore is 4. GCS verbal subscore is 5. GCS motor subscore is 6.  Skin: Skin is warm, dry and intact. No rash noted.  No cyanosis.  Psychiatric: He has a normal mood and affect. His speech is normal and behavior is normal. Thought content normal.    ED Course  Procedures (including critical care time) Labs Review Labs Reviewed  CBC - Abnormal; Notable for the following:    Hemoglobin 12.9 (*)    HCT 38.9 (*)    All other components within normal limits  BASIC METABOLIC PANEL - Abnormal; Notable for the following:    GFR calc non Af Amer 69 (*)    GFR calc Af Amer 79 (*)    All other components within normal limits  URINE CULTURE  URINALYSIS, ROUTINE W REFLEX MICROSCOPIC    Imaging Review No results found.   EKG Interpretation None      MDM   Final diagnoses:  Essential hypertension    Presents with complaints of penis pain. He is hypertensive as well. Patient reports recurrent urinary tract infections in the past. His genitourinary exam, however, was unremarkable. Patient was moderately hypertensive on arrival. His workup was otherwise unremarkable. Blood pressure started to elevate further, patient declined any intervention for this, wanted to be discharged. He was therefore started on Prinivil. Patient administered Cipro, looking through the records he has had recurrent urinary tract infections.    Orpah Greek, MD 07/30/13 [phone]

## 2013-07-28 NOTE — ED Notes (Addendum)
Pt reports back to ED today stating his blood pressure is high due to infection in his body. Pt sts "I know my body, you need to find where the infection is.". Pt then states his groin is really sore and he thinks that's where infection is. Pt is requesting male doctor.

## 2013-07-28 NOTE — ED Notes (Signed)
DC VS obtained by EDT and noted by this nurse Attempting to locate Dr. Betsey Holiday to make aware--patient standing in doorway, states that he does not want to wait and wants to go home Patient in NAD--denies headache, denies dizziness or c/o pain Patient agitated r/t wait time in ED tonight

## 2013-07-28 NOTE — ED Notes (Signed)
Blood pressure was measured twice when vitals were assessed. Both times the BP was elevated. During vitals assessment Pt was very agitated, stating no one came in to talk to him, he was never told what was going on, and he was disappointed with how long he was here. RN made aware.

## 2013-07-28 NOTE — ED Notes (Signed)
Pt is requesting Male Dr. Abbott Pao states he was here yesterday for the same thing - High Blood Pressure. Pt states no hx of High Blood Pressure. Pt states its only High when he has an infection.

## 2013-07-28 NOTE — Discharge Instructions (Signed)
Hypertension Hypertension, commonly called high blood pressure, is when the force of blood pumping through your arteries is too strong. Your arteries are the blood vessels that carry blood from your heart throughout your body. A blood pressure reading consists of a higher number over a lower number, such as 110/72. The higher number (systolic) is the pressure inside your arteries when your heart pumps. The lower number (diastolic) is the pressure inside your arteries when your heart relaxes. Ideally you want your blood pressure below 120/80. Hypertension forces your heart to work harder to pump blood. Your arteries may become narrow or stiff. Having hypertension puts you at risk for heart disease, stroke, and other problems.  RISK FACTORS Some risk factors for high blood pressure are controllable. Others are not.  Risk factors you cannot control include:   Race. You may be at higher risk if you are African American.  Age. Risk increases with age.  Gender. Men are at higher risk than women before age 45 years. After age 65, women are at higher risk than men. Risk factors you can control include:  Not getting enough exercise or physical activity.  Being overweight.  Getting too much fat, sugar, calories, or salt in your diet.  Drinking too much alcohol. SIGNS AND SYMPTOMS Hypertension does not usually cause signs or symptoms. Extremely high blood pressure (hypertensive crisis) may cause headache, anxiety, shortness of breath, and nosebleed. DIAGNOSIS  To check if you have hypertension, your health care provider will measure your blood pressure while you are seated, with your arm held at the level of your heart. It should be measured at least twice using the same arm. Certain conditions can cause a difference in blood pressure between your right and left arms. A blood pressure reading that is higher than normal on one occasion does not mean that you need treatment. If one blood pressure reading  is high, ask your health care provider about having it checked again. TREATMENT  Treating high blood pressure includes making lifestyle changes and possibly taking medication. Living a healthy lifestyle can help lower high blood pressure. You may need to change some of your habits. Lifestyle changes may include:  Following the DASH diet. This diet is high in fruits, vegetables, and whole grains. It is low in salt, red meat, and added sugars.  Getting at least 2 1/2 hours of brisk physical activity every week.  Losing weight if necessary.  Not smoking.  Limiting alcoholic beverages.  Learning ways to reduce stress. If lifestyle changes are not enough to get your blood pressure under control, your health care provider may prescribe medicine. You may need to take more than one. Work closely with your health care provider to understand the risks and benefits. HOME CARE INSTRUCTIONS  Have your blood pressure rechecked as directed by your health care provider.   Only take medicine as directed by your health care provider. Follow the directions carefully. Blood pressure medicines must be taken as prescribed. The medicine does not work as well when you skip doses. Skipping doses also puts you at risk for problems.   Do not smoke.   Monitor your blood pressure at home as directed by your health care provider. SEEK MEDICAL CARE IF:   You think you are having a reaction to medicines taken.  You have recurrent headaches or feel dizzy.  You have swelling in your ankles.  You have trouble with your vision. SEEK IMMEDIATE MEDICAL CARE IF:  You develop a severe headache or   confusion.  You have unusual weakness, numbness, or feel faint.  You have severe chest or abdominal pain.  You vomit repeatedly.  You have trouble breathing. MAKE SURE YOU:   Understand these instructions.  Will watch your condition.  Will get help right away if you are not doing well or get  worse. Document Released: 01/09/2005 Document Revised: 01/14/2013 Document Reviewed: 11/01/2012 [employer] Patient Information 2015 [employer]. This information is not intended to replace advice given to you by your health care provider. Make sure you discuss any questions you have with your health care provider.  

## 2013-07-28 NOTE — ED Notes (Signed)
Assumed care of patient Patient states that his BP is elevated due to "an infection in my body that you all need to find." Patient seen in ED on 7/5 for same complaints--U/A was negative, despite patient insisting that he has a UTI Patient does not want a male nurse or MD/PA Patient appears in NAD Call bell in reach

## 2013-07-28 NOTE — ED Notes (Signed)
Dr. Betsey Holiday made aware of patient's VS Patient continues to refuse to wait for MD to assess Patient refused repeat VS and refused to sign for DC papers---patient states that he wants to go home MD OK with patient going home As previously noted, patient has been asymptomatic despite VS Delsa Sale, Writer Nurse is aware of patient VS and patient complaints

## 2013-07-29 LAB — URINE CULTURE
Colony Count: NO GROWTH
Culture: NO GROWTH

## 2013-11-09 ENCOUNTER — Emergency Department (HOSPITAL_COMMUNITY)
Admission: EM | Admit: 2013-11-09 | Discharge: 2013-11-09 | Disposition: A | Discharge status: Home / Self Care | Payer: Self-pay | Attending: Emergency Medicine | Admitting: Emergency Medicine

## 2013-11-09 ENCOUNTER — Encounter (HOSPITAL_COMMUNITY): Payer: Self-pay | Admitting: Emergency Medicine

## 2013-11-09 DIAGNOSIS — Z79899 Other long term (current) drug therapy: Secondary | ICD-10-CM | POA: Insufficient documentation

## 2013-11-09 DIAGNOSIS — I10 Essential (primary) hypertension: Secondary | ICD-10-CM | POA: Insufficient documentation

## 2013-11-09 DIAGNOSIS — N390 Urinary tract infection, site not specified: Secondary | ICD-10-CM | POA: Insufficient documentation

## 2013-11-09 LAB — URINALYSIS, ROUTINE W REFLEX MICROSCOPIC
Bilirubin Urine: NEGATIVE
Glucose, UA: NEGATIVE mg/dL
Ketones, ur: NEGATIVE mg/dL
Nitrite: NEGATIVE
Protein, ur: NEGATIVE mg/dL
Specific Gravity, Urine: 1.016 (ref 1.005–1.030)
Urobilinogen, UA: 0.2 mg/dL (ref 0.0–1.0)
pH: 6 (ref 5.0–8.0)

## 2013-11-09 LAB — BASIC METABOLIC PANEL
Anion gap: 12 (ref 5–15)
BUN: 16 mg/dL (ref 6–23)
CO2: 24 mEq/L (ref 19–32)
Calcium: 9.5 mg/dL (ref 8.4–10.5)
Chloride: 103 mEq/L (ref 96–112)
Creatinine, Ser: 1.2 mg/dL (ref 0.50–1.35)
GFR calc Af Amer: 77 mL/min — ABNORMAL LOW (ref >90)
GFR calc non Af Amer: 66 mL/min — ABNORMAL LOW (ref >90)
Glucose, Bld: 96 mg/dL (ref 70–99)
Potassium: 4.3 mEq/L (ref 3.7–5.3)
Sodium: 139 mEq/L (ref 137–147)

## 2013-11-09 LAB — CBC
HCT: 40 % (ref 39.0–52.0)
Hemoglobin: 13 g/dL (ref 13.0–17.0)
MCH: 29.1 pg (ref 26.0–34.0)
MCHC: 32.5 g/dL (ref 30.0–36.0)
MCV: 89.5 fL (ref 78.0–100.0)
Platelets: 218 10*3/uL (ref 150–400)
RBC: 4.47 MIL/uL (ref 4.22–5.81)
RDW: 14 % (ref 11.5–15.5)
WBC: 8.4 10*3/uL (ref 4.0–10.5)

## 2013-11-09 LAB — URINE MICROSCOPIC-ADD ON

## 2013-11-09 MED ORDER — CEFTRIAXONE SODIUM 250 MG IJ SOLR
250.0000 mg | Freq: Once | INTRAMUSCULAR | Status: AC
  Administered 2013-11-09: 250 mg via INTRAMUSCULAR
  Filled 2013-11-09: qty 250

## 2013-11-09 MED ORDER — AMLODIPINE BESYLATE 10 MG PO TABS: 10.0000 mg | ORAL_TABLET | Freq: Every day | ORAL | Status: DC

## 2013-11-09 MED ORDER — CEPHALEXIN 500 MG PO CAPS: 500.0000 mg | ORAL_CAPSULE | Freq: Four times a day (QID) | ORAL | Status: DC

## 2013-11-09 MED ORDER — AZITHROMYCIN 250 MG PO TABS
1000.0000 mg | ORAL_TABLET | Freq: Once | ORAL | Status: AC
  Administered 2013-11-09: 1000 mg via ORAL
  Filled 2013-11-09: qty 4

## 2013-11-09 MED ORDER — AMLODIPINE BESYLATE 10 MG PO TABS
10.0000 mg | ORAL_TABLET | Freq: Once | ORAL | Status: AC
  Administered 2013-11-09: 10 mg via ORAL
  Filled 2013-11-09: qty 1

## 2013-11-09 NOTE — Discharge Instructions (Signed)
It was our pleasure to provide your ER care today - we hope that you feel better.  The lab tests show a urine infection.  Drink plenty of fluids. Take keflex (antibiotic) as prescribed. A urine culture was sent the results of which will be back in 2 days time, have primary care doctor follow up on that result then. Your blood pressure is high.  Limit salt intake. Take amlodipine as prescribed. Follow up with primary care doctor for recheck of blood pressure in the next few days.  Return to ER if worse, new symptoms, severe pain, vomiting, fevers, severe headache, chest pain, trouble breathing, other concern.    Urinary Tract Infection Urinary tract infections (UTIs) can develop anywhere along your urinary tract. Your urinary tract is your body's drainage system for removing wastes and extra water. Your urinary tract includes two kidneys, two ureters, a bladder, and a urethra. Your kidneys are a pair of bean-shaped organs. Each kidney is about the size of your fist. They are located below your ribs, one on each side of your spine. CAUSES Infections are caused by microbes, which are microscopic organisms, including fungi, viruses, and bacteria. These organisms are so small that they can only be seen through a microscope. Bacteria are the microbes that most commonly cause UTIs. SYMPTOMS  Symptoms of UTIs may vary by age and gender of the patient and by the location of the infection. Symptoms in young women typically include a frequent and intense urge to urinate and a painful, burning feeling in the bladder or urethra during urination. Older women and men are more likely to be tired, shaky, and weak and have muscle aches and abdominal pain. A fever may mean the infection is in your kidneys. Other symptoms of a kidney infection include pain in your back or sides below the ribs, nausea, and vomiting. DIAGNOSIS To diagnose a UTI, your caregiver will ask you about your symptoms. Your caregiver also will ask  to provide a urine sample. The urine sample will be tested for bacteria and white blood cells. White blood cells are made by your body to help fight infection. TREATMENT  Typically, UTIs can be treated with medication. Because most UTIs are caused by a bacterial infection, they usually can be treated with the use of antibiotics. The choice of antibiotic and length of treatment depend on your symptoms and the type of bacteria causing your infection. HOME CARE INSTRUCTIONS  If you were prescribed antibiotics, take them exactly as your caregiver instructs you. Finish the medication even if you feel better after you have only taken some of the medication.  Drink enough water and fluids to keep your urine clear or pale yellow.  Avoid caffeine, tea, and carbonated beverages. They tend to irritate your bladder.  Empty your bladder often. Avoid holding urine for long periods of time.  Empty your bladder before and after sexual intercourse.  After a bowel movement, women should cleanse from front to back. Use each tissue only once. SEEK MEDICAL CARE IF:   You have back pain.  You develop a fever.  Your symptoms do not begin to resolve within 3 days. SEEK IMMEDIATE MEDICAL CARE IF:   You have severe back pain or lower abdominal pain.  You develop chills.  You have nausea or vomiting.  You have continued burning or discomfort with urination. MAKE SURE YOU:   Understand these instructions.  Will watch your condition.  Will get help right away if you are not doing well or  get worse. Document Released: 10/19/2004 Document Revised: 07/11/2011 Document Reviewed: 02/17/2011 [facility] Patient Information 2015 [place], [place]. This information is not intended to replace advice given to you by your health care provider. Make sure you discuss any questions you have with your health care provider.    Hypertension Hypertension, commonly called high blood pressure, is when the force of blood  pumping through your arteries is too strong. Your arteries are the blood vessels that carry blood from your heart throughout your body. A blood pressure reading consists of a higher number over a lower number, such as 110/72. The higher number (systolic) is the pressure inside your arteries when your heart pumps. The lower number (diastolic) is the pressure inside your arteries when your heart relaxes. Ideally you want your blood pressure below 120/80. Hypertension forces your heart to work harder to pump blood. Your arteries may become narrow or stiff. Having hypertension puts you at risk for heart disease, stroke, and other problems.  RISK FACTORS Some risk factors for high blood pressure are controllable. Others are not.  Risk factors you cannot control include:   Race. You may be at higher risk if you are African American.  Age. Risk increases with age.  Gender. Men are at higher risk than women before age 16 years. After age 39, women are at higher risk than men. Risk factors you can control include:  Not getting enough exercise or physical activity.  Being overweight.  Getting too much fat, sugar, calories, or salt in your diet.  Drinking too much alcohol. SIGNS AND SYMPTOMS Hypertension does not usually cause signs or symptoms. Extremely high blood pressure (hypertensive crisis) may cause headache, anxiety, shortness of breath, and nosebleed. DIAGNOSIS  To check if you have hypertension, your health care provider will measure your blood pressure while you are seated, with your arm held at the level of your heart. It should be measured at least twice using the same arm. Certain conditions can cause a difference in blood pressure between your right and left arms. A blood pressure reading that is higher than normal on one occasion does not mean that you need treatment. If one blood pressure reading is high, ask your health care provider about having it checked again. TREATMENT  Treating  high blood pressure includes making lifestyle changes and possibly taking medicine. Living a healthy lifestyle can help lower high blood pressure. You may need to change some of your habits. Lifestyle changes may include:  Following the DASH diet. This diet is high in fruits, vegetables, and whole grains. It is low in salt, red meat, and added sugars.  Getting at least 2 hours of brisk physical activity every week.  Losing weight if necessary.  Not smoking.  Limiting alcoholic beverages.  Learning ways to reduce stress. If lifestyle changes are not enough to get your blood pressure under control, your health care provider may prescribe medicine. You may need to take more than one. Work closely with your health care provider to understand the risks and benefits. HOME CARE INSTRUCTIONS  Have your blood pressure rechecked as directed by your health care provider.   Take medicines only as directed by your health care provider. Follow the directions carefully. Blood pressure medicines must be taken as prescribed. The medicine does not work as well when you skip doses. Skipping doses also puts you at risk for problems.   Do not smoke.   Monitor your blood pressure at home as directed by your health care provider. SEEK  MEDICAL CARE IF:   You think you are having a reaction to medicines taken.  You have recurrent headaches or feel dizzy.  You have swelling in your ankles.  You have trouble with your vision. SEEK IMMEDIATE MEDICAL CARE IF:  You develop a severe headache or confusion.  You have unusual weakness, numbness, or feel faint.  You have severe chest or abdominal pain.  You vomit repeatedly.  You have trouble breathing. MAKE SURE YOU:   Understand these instructions.  Will watch your condition.  Will get help right away if you are not doing well or get worse. Document Released: 01/09/2005 Document Revised: 05/26/2013 Document Reviewed: 11/01/2012 [facility]  Patient Information 2015 [place], [place]. This information is not intended to replace advice given to you by your health care provider. Make sure you discuss any questions you have with your health care provider.

## 2013-11-09 NOTE — ED Notes (Addendum)
Pt reports hx of HTN,has not taken his BP meds in 1 month, has no reason why he stopped taking BP meds. Reports groin pain x2 weeks with dysuria pain 8/10. Denies trauma or injury. Reports unprotected sex with his gf. Pt reports intermittent dizziness x2 weeks.

## 2013-11-09 NOTE — ED Provider Notes (Addendum)
CSN: [id number]     Arrival date & time 11/09/13  0865 History   First MD Initiated Contact with Patient 11/09/13 1043     Chief Complaint  Patient presents with  . Groin Pain  . Dizziness     (Consider location/radiation/quality/duration/timing/severity/associated sxs/prior Treatment) Patient is a 58 y.o. male presenting with groin pain. The history is provided by the patient.  Groin Pain Pertinent negatives include no chest pain, no abdominal pain, no headaches and no shortness of breath.  pt states last week transiently felt mildly lightheaded and became concerned his blood pressure was high, and wants that checked.  He denies any current lightheadedness/dizziness. No syncope. Pt states not compliant w bp meds for over a month.  States he does not want to go back on same med as it made him urinate too frequently, and so he will not take.  He denies any current urinary frequency.  Pt does indicate mild pain in shaft of penis in past couple days, esp after urinating, ?mild dysuria. Denies new sexual contacts or known std exposure. No penile discharge. No scrotal or testicular pain. No abd pain. Denies fever or chills.      Past Medical History  Diagnosis Date  . Hypertension    Past Surgical History  Procedure Laterality Date  . Knee surgery      x3, x1 R  . Tonsillectomy    . Appendectomy    . Hand surgery    . Eye surgery     History reviewed. No pertinent family history. History  Substance Use Topics  . Smoking status: Never Smoker   . Smokeless tobacco: Never Used  . Alcohol Use: Yes     Comment: socially    Review of Systems  Constitutional: Negative for fever and chills.  HENT: Negative for sore throat.   Eyes: Negative for visual disturbance.  Respiratory: Negative for cough and shortness of breath.   Cardiovascular: Negative for chest pain, palpitations and leg swelling.  Gastrointestinal: Negative for vomiting, abdominal pain and diarrhea.  Endocrine:  Negative for polyuria.  Genitourinary: Positive for dysuria. Negative for flank pain, discharge, scrotal swelling and testicular pain.  Musculoskeletal: Negative for back pain and neck pain.  Skin: Negative for rash.  Neurological: Negative for syncope, speech difficulty, weakness, numbness and headaches.  Hematological: Does not bruise/bleed easily.  Psychiatric/Behavioral: Negative for confusion.      Allergies  Review of patient's allergies indicates no known allergies.  Home Medications   Prior to Admission medications   Medication Sig Start Date End Date Taking? Authorizing Provider  lisinopril (PRINIVIL,ZESTRIL) 5 MG tablet Take 1 tablet (5 mg total) by mouth daily. 07/28/13   Orpah Greek, MD   BP 199/124  Pulse 67  Temp(Src) 97.5 F (36.4 C) (Oral)  Resp 16  SpO2 100% Physical Exam  Nursing note and vitals reviewed. Constitutional: He is oriented to person, place, and time. He appears well-developed and well-nourished. No distress.  HENT:  Head: Atraumatic.  Mouth/Throat: Oropharynx is clear and moist.  Eyes: Conjunctivae are normal. Pupils are equal, round, and reactive to light. No scleral icterus.  Neck: Neck supple. No tracheal deviation present.  Cardiovascular: Normal rate, regular rhythm, normal heart sounds and intact distal pulses.   Pulmonary/Chest: Effort normal and breath sounds normal. No accessory muscle usage. No respiratory distress.  Abdominal: Soft. Bowel sounds are normal. He exhibits no distension and no mass. There is no tenderness. There is no rebound and no guarding.  Genitourinary:  Normal ext genitalia, no scrotal or testicular pain, swelling or tenderness. No penile swelling or ulceration. No phimosis or paraphimosis - no inflammatory or skin changes. No penile discharge.   Musculoskeletal: Normal range of motion. He exhibits no edema and no tenderness.  Neurological: He is alert and oriented to person, place, and time.  Steady gait.    Skin: Skin is warm and dry. No rash noted.  Psychiatric: He has a normal mood and affect.    ED Course  Procedures (including critical care time) Labs Review  Results for orders placed during the hospital encounter of 11/09/13  URINALYSIS, ROUTINE W REFLEX MICROSCOPIC      Result Value Ref Range   Color, Urine YELLOW  YELLOW   APPearance CLOUDY (*) CLEAR   Specific Gravity, Urine 1.016  1.005 - 1.030   pH 6.0  5.0 - 8.0   Glucose, UA NEGATIVE  NEGATIVE mg/dL   Hgb urine dipstick TRACE (*) NEGATIVE   Bilirubin Urine NEGATIVE  NEGATIVE   Ketones, ur NEGATIVE  NEGATIVE mg/dL   Protein, ur NEGATIVE  NEGATIVE mg/dL   Urobilinogen, UA 0.2  0.0 - 1.0 mg/dL   Nitrite NEGATIVE  NEGATIVE   Leukocytes, UA LARGE (*) NEGATIVE  CBC      Result Value Ref Range   WBC 8.4  4.0 - 10.5 K/uL   RBC 4.47  4.22 - 5.81 MIL/uL   Hemoglobin 13.0  13.0 - 17.0 g/dL   HCT 40.0  39.0 - 52.0 %   MCV 89.5  78.0 - 100.0 fL   MCH 29.1  26.0 - 34.0 pg   MCHC 32.5  30.0 - 36.0 g/dL   RDW 14.0  11.5 - 15.5 %   Platelets 218  150 - 400 K/uL  BASIC METABOLIC PANEL      Result Value Ref Range   Sodium 139  137 - 147 mEq/L   Potassium 4.3  3.7 - 5.3 mEq/L   Chloride 103  96 - 112 mEq/L   CO2 24  19 - 32 mEq/L   Glucose, Bld 96  70 - 99 mg/dL   BUN 16  6 - 23 mg/dL   Creatinine, Ser 1.20  0.50 - 1.35 mg/dL   Calcium 9.5  8.4 - 10.5 mg/dL   GFR calc non Af Amer 66 (*) >90 mL/min   GFR calc Af Amer 77 (*) >90 mL/min   Anion gap 12  5 - 15  URINE MICROSCOPIC-ADD ON      Result Value Ref Range   Squamous Epithelial / LPF RARE  RARE   WBC, UA TOO NUMEROUS TO COUNT  <3 WBC/hpf   RBC / HPF 3-6  <3 RBC/hpf   Bacteria, UA RARE  RARE        MDM  Labs.  Reviewed nursing notes and prior charts for additional history.   ua positive. Will culture and rx. Given penile/urethral pain will also cover for gc/chlamydia.    Rocephin and zithromax given.   rx keflex for home.    Recheck bp high. No headache  or neurologic symptoms. No cp or sob.  Reviewed remote records, had been on amlodipine in past. Amlodipine po.  Pt afeb, abd soft nt.  Recheck bp 178/99, improved from prior.  Pt appears stable for d/c.       Mirna Mires, MD 11/09/13 1356

## 2013-11-10 LAB — GC/CHLAMYDIA PROBE AMP
CT Probe RNA: NEGATIVE
GC Probe RNA: NEGATIVE

## 2013-11-11 LAB — URINE CULTURE
Colony Count: >=100000
Special Requests: NORMAL

## 2013-11-13 ENCOUNTER — Telehealth (HOSPITAL_COMMUNITY): Payer: Self-pay

## 2013-11-13 NOTE — ED Notes (Signed)
Post ED Visit - Positive Culture Follow-up  Culture report reviewed by antimicrobial stewardship pharmacist: [x]  Wes [place], Pharm.D., BCPS []  Heide Guile, Pharm.D., BCPS []  Alycia Rossetti, Pharm.D., BCPS []  [place], Pharm.D., BCPS, AAHIVP []  Legrand Como, Pharm.D., BCPS, AAHIVP []  Carly Sabat, Pharm.D. []  Elenor Quinones, Pharm.D.  Positive urine culture Treated with cephalexin, organism sensitive to the same and no further patient follow-up is required at this time.  Ileene Musa 11/13/2013, 7:41 AM

## 2014-03-15 ENCOUNTER — Encounter (HOSPITAL_COMMUNITY): Payer: Self-pay | Admitting: Emergency Medicine

## 2014-03-15 ENCOUNTER — Emergency Department (HOSPITAL_COMMUNITY)
Admission: EM | Admit: 2014-03-15 | Discharge: 2014-03-15 | Disposition: A | Discharge status: Home / Self Care | Payer: Medicaid Other | Attending: Emergency Medicine | Admitting: Emergency Medicine

## 2014-03-15 DIAGNOSIS — I10 Essential (primary) hypertension: Secondary | ICD-10-CM | POA: Insufficient documentation

## 2014-03-15 DIAGNOSIS — L723 Sebaceous cyst: Secondary | ICD-10-CM | POA: Diagnosis not present

## 2014-03-15 DIAGNOSIS — L02212 Cutaneous abscess of back [any part, except buttock]: Secondary | ICD-10-CM | POA: Diagnosis present

## 2014-03-15 DIAGNOSIS — Z79899 Other long term (current) drug therapy: Secondary | ICD-10-CM | POA: Insufficient documentation

## 2014-03-15 DIAGNOSIS — Z792 Long term (current) use of antibiotics: Secondary | ICD-10-CM | POA: Insufficient documentation
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
## 2014-03-15 NOTE — Discharge Instructions (Signed)
Epidermal Cyst An epidermal cyst is sometimes called a sebaceous cyst, epidermal inclusion cyst, or infundibular cyst. These cysts usually contain a substance that looks "pasty" or "cheesy" and may have a bad smell. This substance is a protein called keratin. Epidermal cysts are usually found on the face, neck, or trunk. They may also occur in the vaginal area or other parts of the genitalia of both men and women. Epidermal cysts are usually small, painless, slow-growing bumps or lumps that move freely under the skin. It is important not to try to pop them. This may cause an infection and lead to tenderness and swelling. CAUSES  Epidermal cysts may be caused by a deep penetrating injury to the skin or a plugged hair follicle, often associated with acne. SYMPTOMS  Epidermal cysts can become inflamed and cause:  Redness.  Tenderness.  Increased temperature of the skin over the bumps or lumps.  Grayish-white, bad smelling material that drains from the bump or lump. DIAGNOSIS  Epidermal cysts are easily diagnosed by your caregiver during an exam. Rarely, a tissue sample (biopsy) may be taken to rule out other conditions that may resemble epidermal cysts. TREATMENT   Epidermal cysts often get better and disappear on their own. They are rarely ever cancerous.  If a cyst becomes infected, it may become inflamed and tender. This may require opening and draining the cyst. Treatment with antibiotics may be necessary. When the infection is gone, the cyst may be removed with minor surgery.  Small, inflamed cysts can often be treated with antibiotics or by injecting steroid medicines.  Sometimes, epidermal cysts become large and bothersome. If this happens, surgical removal in your caregiver's office may be necessary. HOME CARE INSTRUCTIONS  Only take over-the-counter or prescription medicines as directed by your caregiver.  Take your antibiotics as directed. Finish them even if you start to feel  better. SEEK MEDICAL CARE IF:   Your cyst becomes tender, red, or swollen.  Your condition is not improving or is getting worse.  You have any other questions or concerns. MAKE SURE YOU:  Understand these instructions.  Will watch your condition.  Will get help right away if you are not doing well or get worse. Document Released: 12/11/2003 Document Revised: 04/03/2011 Document Reviewed: 07/18/2010 [facility] Patient Information 2015 [place], [place]. This information is not intended to replace advice given to you by your health care provider. Make sure you discuss any questions you have with your health care provider.  Cyst Removal Your caregiver has removed a cyst. A cyst is a sac containing a semi-solid material. Cysts may occur any place on your body. They may remain small for years or gradually get larger. A sebaceous cyst is an enlarged (dilated) sweat gland filled with old sweat (sebum). Unattended, these may become large (the size of a softball) over several years time. These are often removed for improved appearance (cosmetic) reasons or before they become infected to form an abscess. An abscess is an infected cyst. HOME CARE INSTRUCTIONS   Keep your bandage clean and dry. You may change your bandage after 24 hours. If your bandage sticks, use warm water to gently loosen it. Pat the area dry with a clean towel before putting on another bandage.  If possible, keep the area where the cyst was removed raised to relieve soreness, swelling, and promote healing.  If you have stitches, keep them clean and dry.  You may clean your stitches gently with a cotton swab dipped in warm soapy water.  Do not soak the area where the cyst was removed or go swimming. You may shower.  Do not overuse the area where your cyst was removed.  Return in 7 days or as directed to have your stitches removed.  Take medicines as instructed by your caregiver. SEEK IMMEDIATE MEDICAL CARE IF:   An oral  temperature above 102 F (38.9 C) develops, not controlled by medication.  Blood continues to soak through the bandage.  You have increasing pain in the area where your cyst was removed.  You have redness, swelling, pus, a bad smell, soreness (inflammation), or red streaks coming away from the stitches. These are signs of infection. MAKE SURE YOU:   Understand these instructions.  Will watch your condition.  Will get help right away if you are not doing well or get worse. Document Released: 01/07/2000 Document Revised: 04/03/2011 Document Reviewed: 05/02/2007 [facility] Patient Information 2015 [place], [place]. This information is not intended to replace advice given to you by your health care provider. Make sure you discuss any questions you have with your health care provider.

## 2014-03-17 ENCOUNTER — Emergency Department (HOSPITAL_COMMUNITY)
Admission: EM | Admit: 2014-03-17 | Discharge: 2014-03-17 | Disposition: A | Discharge status: Home / Self Care | Payer: Medicaid Other | Attending: Emergency Medicine | Admitting: Emergency Medicine

## 2014-03-17 ENCOUNTER — Encounter (HOSPITAL_COMMUNITY): Payer: Self-pay | Admitting: Emergency Medicine

## 2014-03-17 ENCOUNTER — Emergency Department (HOSPITAL_COMMUNITY): Payer: Medicaid Other

## 2014-03-17 DIAGNOSIS — R350 Frequency of micturition: Secondary | ICD-10-CM | POA: Diagnosis present

## 2014-03-17 DIAGNOSIS — I10 Essential (primary) hypertension: Secondary | ICD-10-CM | POA: Diagnosis not present

## 2014-03-17 DIAGNOSIS — R1030 Lower abdominal pain, unspecified: Secondary | ICD-10-CM

## 2014-03-17 LAB — URINALYSIS, ROUTINE W REFLEX MICROSCOPIC
Bilirubin Urine: NEGATIVE
Glucose, UA: NEGATIVE mg/dL
Hgb urine dipstick: NEGATIVE
Ketones, ur: NEGATIVE mg/dL
Leukocytes, UA: NEGATIVE
Nitrite: NEGATIVE
Protein, ur: NEGATIVE mg/dL
Specific Gravity, Urine: 1.023 (ref 1.005–1.030)
Urobilinogen, UA: 0.2 mg/dL (ref 0.0–1.0)
pH: 5 (ref 5.0–8.0)

## 2014-03-17 LAB — I-STAT CHEM 8, ED
BUN: 20 mg/dL (ref 6–23)
Calcium, Ion: 1.24 mmol/L — ABNORMAL HIGH (ref 1.12–1.23)
Chloride: 106 mmol/L (ref 96–112)
Creatinine, Ser: 1.1 mg/dL (ref 0.50–1.35)
Glucose, Bld: 94 mg/dL (ref 70–99)
HCT: 40 % (ref 39.0–52.0)
Hemoglobin: 13.6 g/dL (ref 13.0–17.0)
Potassium: 3.6 mmol/L (ref 3.5–5.1)
Sodium: 142 mmol/L (ref 135–145)
TCO2: 22 mmol/L (ref 0–100)

## 2014-03-17 MED ORDER — AZITHROMYCIN 250 MG PO TABS
1000.0000 mg | ORAL_TABLET | Freq: Once | ORAL | Status: AC
  Administered 2014-03-17: 1000 mg via ORAL
  Filled 2014-03-17: qty 4

## 2014-03-17 MED ORDER — CEFTRIAXONE SODIUM 250 MG IJ SOLR
250.0000 mg | Freq: Once | INTRAMUSCULAR | Status: AC
  Administered 2014-03-17: 250 mg via INTRAMUSCULAR
  Filled 2014-03-17: qty 250

## 2014-03-17 MED ORDER — AMLODIPINE BESYLATE 10 MG PO TABS
10.0000 mg | ORAL_TABLET | Freq: Every day | ORAL | Status: DC
Start: 2014-03-17 — End: 2015-02-21

## 2014-03-17 MED ORDER — CLONIDINE HCL 0.1 MG PO TABS
0.2000 mg | ORAL_TABLET | Freq: Once | ORAL | Status: AC
  Administered 2014-03-17: 0.2 mg via ORAL
  Filled 2014-03-17: qty 2

## 2014-03-17 NOTE — ED Notes (Signed)
Pt c/o urinary frequency, dysuria, groin pain, worried about renal infection. Patient also has abscess on right shoulder. Denies pus or blood discharge.

## 2014-03-17 NOTE — ED Provider Notes (Signed)
CSN: [id number]     Arrival date & time 03/17/14  1740 History   First MD Initiated Contact with Patient 03/17/14 1821     Chief Complaint  Patient presents with  . Urinary Frequency     (Consider location/radiation/quality/duration/timing/severity/associated sxs/prior Treatment) HPI Comments: 59 year old male with history of high blood pressure presents with urinary frequency dysuria and groin pain for the past 2 days. Patient has a history of STDs in the past, has multiple different sexual partners. No discharge. No kidney disease known. Patient is patient has history of high blood pressure noncompliant with medications. Patient has swelling to the right posterior shoulder which was evaluated recently, nontender no fevers.  Patient is a 59 y.o. male presenting with frequency. The history is provided by the patient.  Urinary Frequency This is a new problem. Pertinent negatives include no chest pain, no abdominal pain, no headaches and no shortness of breath.    Past Medical History  Diagnosis Date  . Hypertension    Past Surgical History  Procedure Laterality Date  . Knee surgery      x3, x1 R  . Tonsillectomy    . Appendectomy    . Hand surgery    . Eye surgery     History reviewed. No pertinent family history. History  Substance Use Topics  . Smoking status: Never Smoker   . Smokeless tobacco: Never Used  . Alcohol Use: Yes     Comment: socially    Review of Systems  Constitutional: Negative for fever and chills.  HENT: Negative for congestion.   Eyes: Negative for visual disturbance.  Respiratory: Negative for shortness of breath.   Cardiovascular: Negative for chest pain.  Gastrointestinal: Negative for vomiting and abdominal pain.  Genitourinary: Positive for dysuria and frequency. Negative for flank pain.  Musculoskeletal: Negative for back pain, neck pain and neck stiffness.  Skin: Negative for rash.  Neurological: Negative for light-headedness and headaches.       Allergies  Review of patient's allergies indicates no known allergies.  Home Medications   Prior to Admission medications   Medication Sig Start Date End Date Taking? Authorizing Provider  amLODipine (NORVASC) 10 MG tablet Take 1 tablet (10 mg total) by mouth daily. 03/17/14   Mariea Clonts, MD  cephALEXin (KEFLEX) 500 MG capsule Take 1 capsule (500 mg total) by mouth 4 (four) times daily. Patient not taking: Reported on 03/17/2014 11/09/13   Mirna Mires, MD  lisinopril (PRINIVIL,ZESTRIL) 5 MG tablet Take 1 tablet (5 mg total) by mouth daily. Patient not taking: Reported on 03/17/2014 07/28/13   Orpah Greek, MD   BP 147/94 mmHg  Pulse 69  Temp(Src) 98 F (36.7 C) (Oral)  Resp 18  SpO2 96% Physical Exam  Constitutional: He is oriented to person, place, and time. He appears well-developed and well-nourished.  HENT:  Head: Normocephalic and atraumatic.  Eyes: Conjunctivae are normal. Right eye exhibits no discharge. Left eye exhibits no discharge.  Neck: Normal range of motion. Neck supple. No tracheal deviation present.  Cardiovascular: Normal rate and regular rhythm.   Pulmonary/Chest: Effort normal and breath sounds normal.  Abdominal: Soft. He exhibits no distension. There is no tenderness. There is no guarding.  Genitourinary:  No discharge or bleeding from the penis, no swelling or tenderness to testicles. No inguinal swelling.  Musculoskeletal: He exhibits no edema.  Neurological: He is alert and oriented to person, place, and time.  Skin: Skin is warm. No rash noted.  Patient has approximate  4 cm diameter fluctuance right posterior thoracic region, no induration, no warmth, nontender.  Psychiatric: He has a normal mood and affect.  Nursing note and vitals reviewed.   ED Course  Procedures (including critical care time) Labs Review Labs Reviewed  I-STAT CHEM 8, ED - Abnormal; Notable for the following:    Calcium, Ion 1.24 (*)    All other  components within normal limits  URINALYSIS, ROUTINE W REFLEX MICROSCOPIC    Imaging Review Ct Renal Stone Study  03/17/2014   CLINICAL DATA:  Dysuria, groin pain for 2 weeks.  EXAM: CT ABDOMEN AND PELVIS WITHOUT CONTRAST  TECHNIQUE: Multidetector CT imaging of the abdomen and pelvis was performed following the standard protocol without IV contrast.  COMPARISON:  CT scan of April 06, 2013.  FINDINGS: Visualized lung bases appear normal. No significant osseous abnormality is noted.  No gallstones are noted. No focal abnormality is noted in the liver or spleen. Stable 6 mm low density is noted in uncinate process of pancreas compared to prior exam. Adrenal glands appear normal. No hydronephrosis or renal obstruction is noted. No renal or ureteral calculi are noted. Stable right renal cyst is noted. Status post appendectomy. No abnormal fluid collection is noted. There is no evidence of bowel obstruction. Urinary bladder appears normal. No significant adenopathy is noted.  IMPRESSION: Stable small cystic abnormality seen in pancreatic uncinate process. Given the lack of change, this most likely represents benign lesion. No other significant abnormality seen in the abdomen or pelvis.   Electronically Signed   By: Marijo Conception, M.D.   On: 03/17/2014 20:49     EKG Interpretation None      MDM   Final diagnoses:  Groin pain  Essential hypertension  Dysuria  Patient has fluctuance discussed differential including lipoma/cyst, early abscess unlikely due to no induration tenderness or infectious symptoms.  Patient has dysuria and frequency, STD prophylaxis given. Patient's blood pressure elevated, noncompliant. Plan for clonidine, creatinine and CT scan stone study to look for cause of groin pain and elevated blood pressure.  CT scan results reviewed no acute findings. Patient noncompliant with medications, discussed importance of taking blood pressure medications and follow with the primary  doctor. Patient understands risks of stroke heart attack other if uncontrolled blood pressure persists. Patient has no chest pain shortness of breath syncope or other blood pressure related concerns at this time.  Results and differential diagnosis were discussed with the patient/parent/guardian. Close follow up outpatient was discussed, comfortable with the plan.   Medications  cloNIDine (CATAPRES) tablet 0.2 mg (0.2 mg Oral Given 03/17/14 1856)  cefTRIAXone (ROCEPHIN) injection 250 mg (250 mg Intramuscular Given 03/17/14 1858)  azithromycin (ZITHROMAX) tablet 1,000 mg (1,000 mg Oral Given 03/17/14 1856)    Filed Vitals:   03/17/14 1755 03/17/14 1810 03/17/14 1925 03/17/14 2051  BP: 214/121 212/20 188/111 147/94  Pulse: 69  62 69  Temp: 98 F (36.7 C)     TempSrc: Oral     Resp: 16  16 18   SpO2: 99%  97% 96%    Final diagnoses:  Groin pain  Essential hypertension        Mariea Clonts, MD 03/17/14 2056

## 2014-03-17 NOTE — Discharge Instructions (Signed)
Please follow-up closely with primary Dr. for blood pressure management and follow-up with specialist for urinary symptoms. Safe sexual practices.  If you were given medicines take as directed.  If you are on coumadin or contraceptives realize their levels and effectiveness is altered by many different medicines.  If you have any reaction (rash, tongues swelling, other) to the medicines stop taking and see a physician.   Please follow up as directed and return to the ER or see a physician for new or worsening symptoms.  Thank you. Filed Vitals:   03/17/14 1755 03/17/14 1810 03/17/14 1925  BP: 214/121 212/20 188/111  Pulse: 69  62  Temp: 98 F (36.7 C)    TempSrc: Oral    Resp: 16  16  SpO2: 99%  97%

## 2014-06-16 ENCOUNTER — Emergency Department (HOSPITAL_COMMUNITY)
Admission: EM | Admit: 2014-06-16 | Discharge: 2014-06-16 | Disposition: A | Discharge status: Home / Self Care | Payer: Medicaid Other | Attending: Emergency Medicine | Admitting: Emergency Medicine

## 2014-06-16 ENCOUNTER — Encounter (HOSPITAL_COMMUNITY): Payer: Self-pay | Admitting: Physical Medicine and Rehabilitation

## 2014-06-16 DIAGNOSIS — Z79899 Other long term (current) drug therapy: Secondary | ICD-10-CM | POA: Diagnosis not present

## 2014-06-16 DIAGNOSIS — R1030 Lower abdominal pain, unspecified: Secondary | ICD-10-CM | POA: Diagnosis not present

## 2014-06-16 DIAGNOSIS — Z9114 Patient's other noncompliance with medication regimen: Secondary | ICD-10-CM

## 2014-06-16 DIAGNOSIS — Z9049 Acquired absence of other specified parts of digestive tract: Secondary | ICD-10-CM | POA: Diagnosis not present

## 2014-06-16 DIAGNOSIS — I10 Essential (primary) hypertension: Secondary | ICD-10-CM | POA: Diagnosis not present

## 2014-06-16 DIAGNOSIS — Z9119 Patient's noncompliance with other medical treatment and regimen: Secondary | ICD-10-CM | POA: Insufficient documentation

## 2014-06-16 DIAGNOSIS — R109 Unspecified abdominal pain: Secondary | ICD-10-CM

## 2014-06-16 DIAGNOSIS — Z91148 Patient's other noncompliance with medication regimen for other reason: Secondary | ICD-10-CM

## 2014-06-16 LAB — CBC WITH DIFFERENTIAL/PLATELET
Basophils Absolute: 0 10*3/uL (ref 0.0–0.1)
Basophils Relative: 0 % (ref 0–1)
Eosinophils Absolute: 0.2 10*3/uL (ref 0.0–0.7)
Eosinophils Relative: 3 % (ref 0–5)
HCT: 37.1 % — ABNORMAL LOW (ref 39.0–52.0)
Hemoglobin: 12.4 g/dL — ABNORMAL LOW (ref 13.0–17.0)
Lymphocytes Relative: 36 % (ref 12–46)
Lymphs Abs: 2.6 10*3/uL (ref 0.7–4.0)
MCH: 29.4 pg (ref 26.0–34.0)
MCHC: 33.4 g/dL (ref 30.0–36.0)
MCV: 87.9 fL (ref 78.0–100.0)
Monocytes Absolute: 0.4 10*3/uL (ref 0.1–1.0)
Monocytes Relative: 6 % (ref 3–12)
Neutro Abs: 4 10*3/uL (ref 1.7–7.7)
Neutrophils Relative %: 55 % (ref 43–77)
Platelets: 192 10*3/uL (ref 150–400)
RBC: 4.22 MIL/uL (ref 4.22–5.81)
RDW: 14.1 % (ref 11.5–15.5)
WBC: 7.1 10*3/uL (ref 4.0–10.5)

## 2014-06-16 LAB — COMPREHENSIVE METABOLIC PANEL
ALT: 26 U/L (ref 17–63)
AST: 25 U/L (ref 15–41)
Albumin: 4.2 g/dL (ref 3.5–5.0)
Alkaline Phosphatase: 69 U/L (ref 38–126)
Anion gap: 7 (ref 5–15)
BUN: 11 mg/dL (ref 6–20)
CO2: 28 mmol/L (ref 22–32)
Calcium: 9.6 mg/dL (ref 8.9–10.3)
Chloride: 107 mmol/L (ref 101–111)
Creatinine, Ser: 1.19 mg/dL (ref 0.61–1.24)
GFR calc Af Amer: >60 mL/min (ref >60)
GFR calc non Af Amer: >60 mL/min (ref >60)
Glucose, Bld: 90 mg/dL (ref 65–99)
Potassium: 3.9 mmol/L (ref 3.5–5.1)
Sodium: 142 mmol/L (ref 135–145)
Total Bilirubin: 0.3 mg/dL (ref 0.3–1.2)
Total Protein: 7.2 g/dL (ref 6.5–8.1)

## 2014-06-16 LAB — URINALYSIS, ROUTINE W REFLEX MICROSCOPIC
Bilirubin Urine: NEGATIVE
Glucose, UA: NEGATIVE mg/dL
Hgb urine dipstick: NEGATIVE
Ketones, ur: NEGATIVE mg/dL
Leukocytes, UA: NEGATIVE
Nitrite: NEGATIVE
Protein, ur: NEGATIVE mg/dL
Specific Gravity, Urine: 1.025 (ref 1.005–1.030)
Urobilinogen, UA: 0.2 mg/dL (ref 0.0–1.0)
pH: 5 (ref 5.0–8.0)

## 2014-06-16 MED ORDER — LISINOPRIL 20 MG PO TABS
20.0000 mg | ORAL_TABLET | Freq: Once | ORAL | Status: AC
  Administered 2014-06-16: 20 mg via ORAL
  Filled 2014-06-16: qty 1

## 2014-06-16 MED ORDER — LISINOPRIL 10 MG PO TABS: 10.0000 mg | ORAL_TABLET | Freq: Every day | ORAL | Status: DC

## 2014-06-16 MED ORDER — ACETAMINOPHEN 500 MG PO TABS
1000.0000 mg | ORAL_TABLET | Freq: Once | ORAL | Status: AC
  Administered 2014-06-16: 1000 mg via ORAL
  Filled 2014-06-16: qty 2

## 2014-06-16 MED ORDER — AMLODIPINE BESYLATE 5 MG PO TABS
5.0000 mg | ORAL_TABLET | Freq: Once | ORAL | Status: AC
  Administered 2014-06-16: 5 mg via ORAL
  Filled 2014-06-16: qty 1

## 2014-06-16 NOTE — Discharge Instructions (Signed)
Please call your doctor for a followup appointment within 24-48 hours. When you talk to your doctor please let them know that you were seen in the emergency department and have them acquire all of your records so that they can discuss the findings with you and formulate a treatment plan to fully care for your new and ongoing problems. ° °

## 2014-06-16 NOTE — ED Provider Notes (Signed)
CSN: [id number]     Arrival date & time 06/16/14  1756 History   First MD Initiated Contact with Patient 06/16/14 2138     Chief Complaint  Patient presents with  . Flank Pain  . Penis Pain     (Consider location/radiation/quality/duration/timing/severity/associated sxs/prior Treatment) HPI Comments: The patient is a 59 year old male, he has a history of remote history of STD as well as history of a urinary infection a long time ago, he states that he had acute onset of bilateral flank pain today while he was using the bathroom, it resolved very quickly but then he states he had pain in his penis which is been present since that time, gradually improving and has almost gone away. He has no dysuria, hematuria, penile discharge and states that he has one sexual partner with whom he had sex last week, no protection. He denies any fevers chills nausea vomiting diarrhea constipation lower extremity swelling, chest pain, shortness of breath or any other complaints. He has not been taking his blood pressure medication regularly  Patient is a 59 y.o. male presenting with flank pain and penile pain. The history is provided by the patient.  Flank Pain  Penis Pain    Past Medical History  Diagnosis Date  . Hypertension    Past Surgical History  Procedure Laterality Date  . Knee surgery      x3, x1 R  . Tonsillectomy    . Appendectomy    . Hand surgery    . Eye surgery     History reviewed. No pertinent family history. History  Substance Use Topics  . Smoking status: Never Smoker   . Smokeless tobacco: Never Used  . Alcohol Use: Yes     Comment: socially    Review of Systems  Genitourinary: Positive for flank pain and penile pain.  All other systems reviewed and are negative.     Allergies  Review of patient's allergies indicates no known allergies.  Home Medications   Prior to Admission medications   Medication Sig Start Date End Date Taking? Authorizing Provider   amLODipine (NORVASC) 10 MG tablet Take 1 tablet (10 mg total) by mouth daily. 03/17/14  Yes Elnora Morrison, MD  lisinopril (PRINIVIL,ZESTRIL) 10 MG tablet Take 1 tablet (10 mg total) by mouth daily. 06/16/14   Noemi Chapel, MD   BP 194/107 mmHg  Pulse 50  Temp(Src) 98 F (36.7 C) (Oral)  Resp 16  SpO2 100% Physical Exam  Constitutional: He appears well-developed and well-nourished. No distress.  HENT:  Head: Normocephalic and atraumatic.  Mouth/Throat: Oropharynx is clear and moist. No oropharyngeal exudate.  Eyes: Conjunctivae and EOM are normal. Pupils are equal, round, and reactive to light. Right eye exhibits no discharge. Left eye exhibits no discharge. No scleral icterus.  Neck: Normal range of motion. Neck supple. No JVD present. No thyromegaly present.  Cardiovascular: Normal rate, regular rhythm, normal heart sounds and intact distal pulses.  Exam reveals no gallop and no friction rub.   No murmur heard. Pulmonary/Chest: Effort normal and breath sounds normal. No respiratory distress. He has no wheezes. He has no rales.  Abdominal: Soft. Bowel sounds are normal. He exhibits no distension and no mass. There is no tenderness.  No abdominal tenderness, no CVA tenderness  Genitourinary:  Normal appearing circumcised penis, normal scrotum and testicles, no tenderness, no redness, no swelling, no rash, no drainage, no urethral discharge  Musculoskeletal: Normal range of motion. He exhibits no edema or tenderness.  Lymphadenopathy:  He has no cervical adenopathy.  Neurological: He is alert. Coordination normal.  Skin: Skin is warm and dry. No rash noted. No erythema.  Psychiatric: He has a normal mood and affect. His behavior is normal.  Nursing note and vitals reviewed.   ED Course  Procedures (including critical care time) Labs Review Labs Reviewed  CBC WITH DIFFERENTIAL/PLATELET - Abnormal; Notable for the following:    Hemoglobin 12.4 (*)    HCT 37.1 (*)    All other  components within normal limits  COMPREHENSIVE METABOLIC PANEL  URINALYSIS, ROUTINE W REFLEX MICROSCOPIC    Imaging Review No results found.    MDM   Final diagnoses:  Essential hypertension  Non compliance w medication regimen  Flank pain    The patient is well-appearing, his blood pressure is severely elevated and will need treatment, he has not had his medication today, I will start this as well as starting lisinopril as well, he has been counseled on the possible side effects but the benefits of taking this medication and is willing to try.  Labs normal - UA normal, VS with improvement on meds - d/w pt at length the need for strict compliance,  Meds given in ED:  Medications  lisinopril (PRINIVIL,ZESTRIL) tablet 20 mg (20 mg Oral Given 06/16/14 2212)  amLODipine (NORVASC) tablet 5 mg (5 mg Oral Given 06/16/14 2212)  acetaminophen (TYLENOL) tablet 1,000 mg (1,000 mg Oral Given 06/16/14 2212)    New Prescriptions   LISINOPRIL (PRINIVIL,ZESTRIL) 10 MG TABLET    Take 1 tablet (10 mg total) by mouth daily.    Filed Vitals:   06/16/14 1825 06/16/14 2158 06/16/14 2300  BP: 177/114 230/110 194/107  Pulse: 62 55 50  Temp: 98 F (36.7 C)    TempSrc: Oral    Resp: 16 16 16   SpO2: 98% 100% 100%     Noemi Chapel, MD 06/16/14 2303

## 2014-06-16 NOTE — ED Notes (Signed)
Pt presents to department for evaluation of bilateral flank pain x4 days. Also states pain to penis. Denies dysuria/hematuria. Pt is alert and oriented x4.

## 2014-11-10 ENCOUNTER — Emergency Department (HOSPITAL_COMMUNITY)
Admission: EM | Admit: 2014-11-10 | Discharge: 2014-11-10 | Disposition: A | Discharge status: Home / Self Care | Payer: Medicaid Other | Attending: Emergency Medicine | Admitting: Emergency Medicine

## 2014-11-10 ENCOUNTER — Encounter (HOSPITAL_COMMUNITY): Payer: Self-pay | Admitting: Emergency Medicine

## 2014-11-10 DIAGNOSIS — Z113 Encounter for screening for infections with a predominantly sexual mode of transmission: Secondary | ICD-10-CM | POA: Diagnosis not present

## 2014-11-10 DIAGNOSIS — Z79899 Other long term (current) drug therapy: Secondary | ICD-10-CM | POA: Insufficient documentation

## 2014-11-10 DIAGNOSIS — I10 Essential (primary) hypertension: Secondary | ICD-10-CM | POA: Insufficient documentation

## 2014-11-10 DIAGNOSIS — R300 Dysuria: Secondary | ICD-10-CM | POA: Diagnosis not present

## 2014-11-10 LAB — URINALYSIS, ROUTINE W REFLEX MICROSCOPIC
Bilirubin Urine: NEGATIVE
Glucose, UA: NEGATIVE mg/dL
Hgb urine dipstick: NEGATIVE
Ketones, ur: NEGATIVE mg/dL
Leukocytes, UA: NEGATIVE
Nitrite: NEGATIVE
Protein, ur: NEGATIVE mg/dL
Specific Gravity, Urine: 1.026 (ref 1.005–1.030)
Urobilinogen, UA: 0.2 mg/dL (ref 0.0–1.0)
pH: 5 (ref 5.0–8.0)

## 2014-11-10 MED ORDER — AZITHROMYCIN 250 MG PO TABS
1000.0000 mg | ORAL_TABLET | Freq: Once | ORAL | Status: AC
  Administered 2014-11-10: 1000 mg via ORAL
  Filled 2014-11-10: qty 4

## 2014-11-10 MED ORDER — LIDOCAINE HCL 1 % IJ SOLN
INTRAMUSCULAR | Status: AC
  Administered 2014-11-10: 1.5 mL
  Filled 2014-11-10: qty 20

## 2014-11-10 MED ORDER — CEFTRIAXONE SODIUM 250 MG IJ SOLR
250.0000 mg | Freq: Once | INTRAMUSCULAR | Status: AC
  Administered 2014-11-10: 250 mg via INTRAMUSCULAR
  Filled 2014-11-10: qty 250

## 2014-11-10 NOTE — ED Notes (Signed)
Per pt, states urine has an odor and painful

## 2014-11-10 NOTE — Discharge Instructions (Signed)

## 2014-11-10 NOTE — ED Provider Notes (Signed)
CSN: [id number]     Arrival date & time 11/10/14  1525 History   By signing my name below, I, Erling Conte, attest that this documentation has been prepared under the direction and in the presence of Saifan Rayford, PA-C. Electronically Signed: Erling Conte, ED Scribe. 11/10/2014. 3:54 PM.   Chief Complaint  Patient presents with  . Dysuria      The history is provided by the patient. No language interpreter was used.    Curtis Saunders is a 59 y.o. male who presents to the Emergency Department with a chief complaint of intermittent, mild dysuria onset 1 week.  He reports an associated abnormal odor to his urine. Pt denies any new sexual contacts or concerns for STD. He has not taken any meds PTA. He denies any penile discharge, fever, or chills.     Past Medical History  Diagnosis Date  . Hypertension    Past Surgical History  Procedure Laterality Date  . Knee surgery      x3, x1 R  . Tonsillectomy    . Appendectomy    . Hand surgery    . Eye surgery     No family history on file. Social History  Substance Use Topics  . Smoking status: Never Smoker   . Smokeless tobacco: Never Used  . Alcohol Use: Yes     Comment: socially    Review of Systems  Constitutional: Negative for fever and chills.  Genitourinary: Positive for dysuria. Negative for discharge.      Allergies  Review of patient's allergies indicates no known allergies.  Home Medications   Prior to Admission medications   Medication Sig Start Date End Date Taking? Authorizing Provider  amLODipine (NORVASC) 10 MG tablet Take 1 tablet (10 mg total) by mouth daily. 03/17/14   Elnora Morrison, MD  lisinopril (PRINIVIL,ZESTRIL) 10 MG tablet Take 1 tablet (10 mg total) by mouth daily. 06/16/14   Noemi Chapel, MD   Triage Vitals: BP 181/93 mmHg  Pulse 65  Temp(Src) 98.3 F (36.8 C) (Oral)  Resp 16  SpO2 100%  Physical Exam  Constitutional: He is oriented to person, place, and time. He appears  well-developed and well-nourished. No distress.  HENT:  Head: Normocephalic and atraumatic.  Eyes: Conjunctivae and EOM are normal.  Neck: Neck supple. No tracheal deviation present.  Cardiovascular: Normal rate.   Pulmonary/Chest: Effort normal. No respiratory distress.  Musculoskeletal: Normal range of motion.  Neurological: He is alert and oriented to person, place, and time.  Skin: Skin is warm and dry.  Psychiatric: He has a normal mood and affect. His behavior is normal.  Nursing note and vitals reviewed.   ED Course  Procedures (including critical care time)  DIAGNOSTIC STUDIES: Oxygen Saturation is 100% on RA, normal by my interpretation.    COORDINATION OF CARE:    Results for orders placed or performed during the hospital encounter of 11/10/14  Urinalysis, Routine w reflex microscopic (not at [facility])  Result Value Ref Range   Color, Urine YELLOW YELLOW   APPearance CLEAR CLEAR   Specific Gravity, Urine 1.026 1.005 - 1.030   pH 5.0 5.0 - 8.0   Glucose, UA NEGATIVE NEGATIVE mg/dL   Hgb urine dipstick NEGATIVE NEGATIVE   Bilirubin Urine NEGATIVE NEGATIVE   Ketones, ur NEGATIVE NEGATIVE mg/dL   Protein, ur NEGATIVE NEGATIVE mg/dL   Urobilinogen, UA 0.2 0.0 - 1.0 mg/dL   Nitrite NEGATIVE NEGATIVE   Leukocytes, UA NEGATIVE NEGATIVE   No results found.  MDM   Final diagnoses:  Dysuria  Screen for STD (sexually transmitted disease)    Patient with dysuria. Urinalysis negative. Question STD exposure. Will treat with Rocephin and Zithromax.  I, Kamaria Lucia, personally performed the services described in this documentation. All medical record entries made by the scribe were at my direction and in my presence.  I have reviewed the chart and discharge instructions and agree that the record reflects my personal performance and is accurate and complete. Kenzel Ruesch.  11/10/2014. 5:14 PM.      Montine Circle, PA-C 11/10/14 1715  Sherwood Gambler,  MD 11/10/14 2348

## 2014-11-11 LAB — GC/CHLAMYDIA PROBE AMP (~~LOC~~) NOT AT ARMC
Chlamydia: NEGATIVE
Neisseria Gonorrhea: NEGATIVE

## 2015-02-21 ENCOUNTER — Encounter (HOSPITAL_COMMUNITY): Payer: Self-pay

## 2015-02-21 ENCOUNTER — Emergency Department (HOSPITAL_COMMUNITY)
Admission: EM | Admit: 2015-02-21 | Discharge: 2015-02-21 | Disposition: A | Discharge status: Home / Self Care | Payer: Medicaid Other | Attending: Emergency Medicine | Admitting: Emergency Medicine

## 2015-02-21 ENCOUNTER — Emergency Department (HOSPITAL_COMMUNITY): Payer: Medicaid Other

## 2015-02-21 DIAGNOSIS — I861 Scrotal varices: Secondary | ICD-10-CM | POA: Diagnosis not present

## 2015-02-21 DIAGNOSIS — N4889 Other specified disorders of penis: Secondary | ICD-10-CM | POA: Diagnosis present

## 2015-02-21 DIAGNOSIS — N50819 Testicular pain, unspecified: Secondary | ICD-10-CM

## 2015-02-21 DIAGNOSIS — N509 Disorder of male genital organs, unspecified: Secondary | ICD-10-CM

## 2015-02-21 DIAGNOSIS — I10 Essential (primary) hypertension: Secondary | ICD-10-CM | POA: Insufficient documentation

## 2015-02-21 DIAGNOSIS — N4400 Torsion of testis, unspecified: Secondary | ICD-10-CM

## 2015-02-21 LAB — URINALYSIS, ROUTINE W REFLEX MICROSCOPIC
Bilirubin Urine: NEGATIVE
Glucose, UA: NEGATIVE mg/dL
Hgb urine dipstick: NEGATIVE
Ketones, ur: NEGATIVE mg/dL
Leukocytes, UA: NEGATIVE
Nitrite: NEGATIVE
Protein, ur: NEGATIVE mg/dL
Specific Gravity, Urine: 1.02 (ref 1.005–1.030)
pH: 5 (ref 5.0–8.0)

## 2015-02-21 MED ORDER — LISINOPRIL 20 MG PO TABS: 20.0000 mg | ORAL_TABLET | Freq: Every day | ORAL | Status: DC

## 2015-02-21 MED ORDER — AMLODIPINE BESYLATE 10 MG PO TABS: 10.0000 mg | ORAL_TABLET | Freq: Every day | ORAL | Status: DC

## 2015-02-21 NOTE — Discharge Instructions (Signed)
Call the urology clinic listed first thing tomorrow morning to schedule your follow up appointment.  Return to ER for any new or worsening symptoms, any additional concerns.

## 2015-02-21 NOTE — ED Notes (Signed)
Pt c/o penile pain x 4 days and hypertension x 2 days.  Pain score 8/10.  Pt reports that he was sleeping when penile pain began.  Denies dysuria and discharge.  Pt ran out of pain medication x 2 days ago.  Sts he just got insurance and is trying to get set-up w/ a PCP.

## 2015-02-21 NOTE — ED Provider Notes (Signed)
CSN: [id number]     Arrival date & time 02/21/15  1308 History  By signing my name below, I, Emmanuella Mensah, attest that this documentation has been prepared under the direction and in the presence of [facility], PA-C. Electronically Signed: Judithann Sauger, ED Scribe. 02/21/2015. 1:50 PM.    Chief Complaint  Patient presents with  . Hypertension  . Penis Pain   The history is provided by the patient. No language interpreter was used.   HPI Comments: Curtis Saunders is a 60 y.o. male with a hx of HTN who presents to the Emergency Department complaining of eye redness consistent with his HTN symptoms onset 2 days ago. He states that he finished his HTN medications 2 days ago. He adds that he does not remember the specific names or dosages of his medications. He denies any fever, chills, eye pain, CP, or HA.   He also c/o sudden onset of ongoing constant throbbing penile pain onset 5 days ago. He reports associated urinary frequency. No alleviating factors noted. Pt has not taken any medications for these symptoms. He states that he had a UTI in the past.    Past Medical History  Diagnosis Date  . Hypertension    Past Surgical History  Procedure Laterality Date  . Knee surgery      x3, x1 R  . Tonsillectomy    . Appendectomy    . Hand surgery    . Eye surgery     History reviewed. No pertinent family history. Social History  Substance Use Topics  . Smoking status: Never Smoker   . Smokeless tobacco: Never Used  . Alcohol Use: Yes     Comment: socially    Review of Systems  Constitutional: Negative for fever and chills.  HENT: Negative for congestion.   Eyes: Positive for redness. Negative for pain.  Respiratory: Negative for cough and shortness of breath.   Cardiovascular: Negative for chest pain.  Gastrointestinal: Negative for abdominal pain.  Genitourinary: Positive for frequency and penile pain.  Musculoskeletal: Negative for back pain and neck pain.   Allergic/Immunologic: Negative for immunocompromised state.  Neurological: Negative for headaches.      Allergies  Review of patient's allergies indicates no known allergies.  Home Medications   Prior to Admission medications   Medication Sig Start Date End Date Taking? Authorizing Provider  amLODipine (NORVASC) 10 MG tablet Take 1 tablet (10 mg total) by mouth daily. 02/21/15   Ozella Almond Allex Madia, PA-C  lisinopril (PRINIVIL,ZESTRIL) 20 MG tablet Take 1 tablet (20 mg total) by mouth daily. 02/21/15   Jurney Overacker Pilcher Emmelina Mcloughlin, PA-C   BP 162/108 mmHg  Pulse 63  Temp(Src) 97.5 F (36.4 C) (Oral)  Resp 16  SpO2 100% Physical Exam  Constitutional: He is oriented to person, place, and time. He appears well-developed and well-nourished.  HENT:  Head: Normocephalic and atraumatic.  Cardiovascular: Normal rate, regular rhythm and normal heart sounds.  Exam reveals no gallop and no friction rub.   No murmur heard. Pulmonary/Chest: Effort normal and breath sounds normal. No respiratory distress. He has no wheezes. He has no rales.  Abdominal: Soft. He exhibits no distension. There is no tenderness.  Genitourinary:  Chaperone present for exam No signs of lesion or erythema on the penis or testicles. TTP of left testicle. Mild left testicular swelling. No testicular masses. No signs of any discharge from the penis. Cremaster reflex present bilaterally.   Neurological: He is alert and oriented to person, place, and time.  Skin: Skin is warm and dry.  Psychiatric: He has a normal mood and affect.  Nursing note and vitals reviewed.   ED Course  Procedures (including critical care time) DIAGNOSTIC STUDIES: Oxygen Saturation is 100% on RA, normal by my interpretation.    COORDINATION OF CARE: 1:47 PM- Pt advised of plan for treatment and pt agrees. Pt will provide urine sample for UA.    Labs Review Labs Reviewed  URINALYSIS, ROUTINE W REFLEX MICROSCOPIC (NOT AT [facility])  GC/CHLAMYDIA PROBE  AMP ([place]) NOT AT [facility]    Imaging Review US Scrotum  02/21/2015  CLINICAL DATA:  60 year old with 1 week history of left scrotal tenderness. EXAM: SCROTAL ULTRASOUND DOPPLER ULTRASOUND OF THE TESTICLES TECHNIQUE: Complete ultrasound examination of the testicles, epididymis, and other scrotal structures was performed. Color and spectral Doppler ultrasound were also utilized to evaluate blood flow to the testicles. COMPARISON:  Scrotal ultrasound 04/06/2013, 04/04/2013. CT abdomen and pelvis 03/17/2014, 04/06/2013. FINDINGS: Right testicle Measurements: Approximately 4.1 x 2.1 x 2.9 cm, unchanged. Normal parenchymal echotexture without mass or microlithiasis. Normal color Doppler flow without evidence of hyperemia. Left testicle Measurements: Approximately 3.1 x 1.8 x 2.1 cm, previously 4.3 x 2.4 x 2.9 cm. Heterogeneous parenchymal echotexture with a possible ill defined hypoechoic mass involving the anterior portion of the testicle. No microlithiasis. Normal color Doppler flow without evidence of hyperemia. Skin thickening is present involving the left hemiscrotum. Right epididymis: Normal in size without hyperemia. 4 mm complex cyst or spermatocele. Left epididymis: Normal in size in appearance without evidence of hyperemia. Hydrocele: Small loculated left hydrocele. Very small, insignificant simple right hydrocele. Varicocele:  Bilateral varicoceles, left larger than right. Pulsed Doppler interrogation of both testes demonstrates normal low resistance arterial and venous waveforms bilaterally. IMPRESSION: 1. No evidence of acute testicular torsion or epididymal orchitis on either side. 2. Small left testicle which has significantly decreased in size since March, 2015. Heterogeneous left testicular echotexture with a possible ill defined hypoechoic mass anteriorly. Query sequelae of prior trauma, sequelae of chronic left varicocele, sequelae of chronic inflammation. Testicular malignancy is also in the  differential diagnosis, though one would not expect the testicle to decrease in size with malignancy. 3. Bilateral varicoceles, left larger than right. 4. Small loculated left hydrocele. Urology consultation may be helpful in further evaluation, given the above left testicular findings. Electronically Signed   By: Evangeline Dakin M.D.   On: 02/21/2015 16:03   [place] Art/ven Flow Abd Pelv Doppler  02/21/2015  CLINICAL DATA:  60 year old with 1 week history of left scrotal tenderness. EXAM: SCROTAL ULTRASOUND DOPPLER ULTRASOUND OF THE TESTICLES TECHNIQUE: Complete ultrasound examination of the testicles, epididymis, and other scrotal structures was performed. Color and spectral Doppler ultrasound were also utilized to evaluate blood flow to the testicles. COMPARISON:  Scrotal ultrasound 04/06/2013, 04/04/2013. CT abdomen and pelvis 03/17/2014, 04/06/2013. FINDINGS: Right testicle Measurements: Approximately 4.1 x 2.1 x 2.9 cm, unchanged. Normal parenchymal echotexture without mass or microlithiasis. Normal color Doppler flow without evidence of hyperemia. Left testicle Measurements: Approximately 3.1 x 1.8 x 2.1 cm, previously 4.3 x 2.4 x 2.9 cm. Heterogeneous parenchymal echotexture with a possible ill defined hypoechoic mass involving the anterior portion of the testicle. No microlithiasis. Normal color Doppler flow without evidence of hyperemia. Skin thickening is present involving the left hemiscrotum. Right epididymis: Normal in size without hyperemia. 4 mm complex cyst or spermatocele. Left epididymis: Normal in size in appearance without evidence of hyperemia. Hydrocele: Small loculated left hydrocele. Very small, insignificant simple  right hydrocele. Varicocele:  Bilateral varicoceles, left larger than right. Pulsed Doppler interrogation of both testes demonstrates normal low resistance arterial and venous waveforms bilaterally. IMPRESSION: 1. No evidence of acute testicular torsion or epididymal orchitis on  either side. 2. Small left testicle which has significantly decreased in size since March, 2015. Heterogeneous left testicular echotexture with a possible ill defined hypoechoic mass anteriorly. Query sequelae of prior trauma, sequelae of chronic left varicocele, sequelae of chronic inflammation. Testicular malignancy is also in the differential diagnosis, though one would not expect the testicle to decrease in size with malignancy. 3. Bilateral varicoceles, left larger than right. 4. Small loculated left hydrocele. Urology consultation may be helpful in further evaluation, given the above left testicular findings. Electronically Signed   By: Evangeline Dakin M.D.   On: 02/21/2015 16:03     [place] Cerise Andjela Wickes, PA-C has personally reviewed and evaluated these images and lab results as part of her medical decision-making.  MDM   Final diagnoses:  Left varicocele   JERRIE ERLING presents for two complaints.   1. Medication refill: confirmed BP with pharmacy. Will refill lisinopril and norvasc. Encouraged PCP management of BP  2. Testicular pain: Exam with TTP and mild swelling of left testicle. U/S shows no torsion or orchitis. + Loculated left hydrocele. See report above for all findings. U/S results discussed with patient. Informed patient to call urology clinic listed on discharge papers to make follow up appointment.   Return precautions given. All questions answered.   I personally performed the services described in this documentation, which was scribed in my presence. The recorded information has been reviewed and is accurate.  [facility] Jaimin Krupka, PA-C 02/21/15 1658  Charlesetta Shanks, MD 02/28/15 [phone]

## 2015-02-22 LAB — GC/CHLAMYDIA PROBE AMP (~~LOC~~) NOT AT ARMC
Chlamydia: NEGATIVE
Neisseria Gonorrhea: NEGATIVE

## 2015-04-11 ENCOUNTER — Emergency Department (HOSPITAL_COMMUNITY)
Admission: EM | Admit: 2015-04-11 | Discharge: 2015-04-11 | Disposition: A | Discharge status: Home / Self Care | Payer: Medicaid Other | Attending: Emergency Medicine | Admitting: Emergency Medicine

## 2015-04-11 ENCOUNTER — Encounter (HOSPITAL_COMMUNITY): Payer: Self-pay | Admitting: Emergency Medicine

## 2015-04-11 DIAGNOSIS — Z79899 Other long term (current) drug therapy: Secondary | ICD-10-CM | POA: Diagnosis not present

## 2015-04-11 DIAGNOSIS — R369 Urethral discharge, unspecified: Secondary | ICD-10-CM | POA: Diagnosis not present

## 2015-04-11 DIAGNOSIS — Z202 Contact with and (suspected) exposure to infections with a predominantly sexual mode of transmission: Secondary | ICD-10-CM

## 2015-04-11 DIAGNOSIS — I10 Essential (primary) hypertension: Secondary | ICD-10-CM

## 2015-04-11 DIAGNOSIS — R300 Dysuria: Secondary | ICD-10-CM | POA: Insufficient documentation

## 2015-04-11 DIAGNOSIS — N4889 Other specified disorders of penis: Secondary | ICD-10-CM | POA: Insufficient documentation

## 2015-04-11 MED ORDER — LISINOPRIL 20 MG PO TABS
20.0000 mg | ORAL_TABLET | Freq: Once | ORAL | Status: AC
  Administered 2015-04-11: 20 mg via ORAL
  Filled 2015-04-11: qty 1

## 2015-04-11 MED ORDER — AZITHROMYCIN 250 MG PO TABS
1000.0000 mg | ORAL_TABLET | Freq: Once | ORAL | Status: AC
  Administered 2015-04-11: 1000 mg via ORAL
  Filled 2015-04-11: qty 4

## 2015-04-11 MED ORDER — LIDOCAINE HCL (PF) 1 % IJ SOLN
INTRAMUSCULAR | Status: AC
Start: 2015-04-11 — End: 2015-04-11
  Administered 2015-04-11: 0.9 mL
  Filled 2015-04-11: qty 5

## 2015-04-11 MED ORDER — AMLODIPINE BESYLATE 10 MG PO TABS
10.0000 mg | ORAL_TABLET | Freq: Once | ORAL | Status: AC
  Administered 2015-04-11: 10 mg via ORAL
  Filled 2015-04-11: qty 1

## 2015-04-11 MED ORDER — METRONIDAZOLE 500 MG PO TABS
2000.0000 mg | ORAL_TABLET | Freq: Once | ORAL | Status: AC
  Administered 2015-04-11: 2000 mg via ORAL
  Filled 2015-04-11: qty 4

## 2015-04-11 MED ORDER — ONDANSETRON 4 MG PO TBDP
4.0000 mg | ORAL_TABLET | Freq: Once | ORAL | Status: AC
  Administered 2015-04-11: 4 mg via ORAL
  Filled 2015-04-11: qty 1

## 2015-04-11 MED ORDER — CEFTRIAXONE SODIUM 250 MG IJ SOLR
250.0000 mg | Freq: Once | INTRAMUSCULAR | Status: AC
  Administered 2015-04-11: 250 mg via INTRAMUSCULAR
  Filled 2015-04-11: qty 250

## 2015-04-11 NOTE — ED Notes (Signed)
Pt states he had unprotected sex with a woman last week. Pt states "she was treated over here last week for something, I don't know what for, but the hospital called and told me to come over here and get treated." Pt states he has "a lot of pain down there, possibly some drainage, and it hurts to pee." Also complaining of some lumps he has on his shoulder that he wants examined.

## 2015-04-11 NOTE — ED Provider Notes (Signed)
CSN: [id number]     Arrival date & time 04/11/15  0804 History   First MD Initiated Contact with Patient 04/11/15 [phone]     Chief Complaint  Patient presents with  . Exposure to STD     (Consider location/radiation/quality/duration/timing/severity/associated sxs/prior Treatment) Patient is a 60 y.o. male presenting with STD exposure.  Exposure to STD This is a new problem. The current episode started more than 2 days ago. The problem occurs constantly. The problem has not changed since onset.Pertinent negatives include no chest pain, no abdominal pain, no headaches and no shortness of breath. Nothing aggravates the symptoms. Nothing relieves the symptoms. He has tried nothing for the symptoms. The treatment provided no relief.    Past Medical History  Diagnosis Date  . Hypertension    Past Surgical History  Procedure Laterality Date  . Knee surgery      x3, x1 R  . Tonsillectomy    . Appendectomy    . Hand surgery    . Eye surgery     History reviewed. No pertinent family history. Social History  Substance Use Topics  . Smoking status: Never Smoker   . Smokeless tobacco: Never Used  . Alcohol Use: Yes     Comment: socially    Review of Systems  Constitutional: Negative for fever.  HENT: Negative for sore throat.   Eyes: Negative for visual disturbance.  Respiratory: Negative for shortness of breath.   Cardiovascular: Negative for chest pain.  Gastrointestinal: Negative for abdominal pain.  Genitourinary: Positive for dysuria, discharge and penile pain. Negative for difficulty urinating.  Musculoskeletal: Negative for back pain and neck stiffness.  Skin: Negative for rash.  Neurological: Negative for syncope and headaches.      Allergies  Review of patient's allergies indicates no known allergies.  Home Medications   Prior to Admission medications   Medication Sig Start Date End Date Taking? Authorizing Provider  amLODipine (NORVASC) 10 MG tablet Take 1 tablet  (10 mg total) by mouth daily. 02/21/15   Ozella Almond Ward, PA-C  lisinopril (PRINIVIL,ZESTRIL) 20 MG tablet Take 1 tablet (20 mg total) by mouth daily. 02/21/15   Jaime Pilcher Ward, PA-C   BP 193/103 mmHg  Pulse 61  Temp(Src) 97.4 F (36.3 C) (Oral)  Resp 18  SpO2 100% Physical Exam  Constitutional: He is oriented to person, place, and time. He appears well-developed and well-nourished. No distress.  HENT:  Head: Normocephalic and atraumatic.  Eyes: Conjunctivae and EOM are normal.  Neck: Normal range of motion.  Cardiovascular: Normal rate, regular rhythm, normal heart sounds and intact distal pulses.  Exam reveals no gallop and no friction rub.   No murmur heard. Pulmonary/Chest: Effort normal and breath sounds normal. No respiratory distress. He has no wheezes. He has no rales.  Abdominal: Soft. He exhibits no distension. There is no tenderness. There is no guarding.  Genitourinary:  Declines exam  Musculoskeletal: He exhibits no edema.       Left shoulder: He exhibits no pain.  10cmx10cm area of swelling to anterior shoulder, no fluctuance, no erythema, normal ROM, cyst vs other tumor/mass   3cmx3cm area of swelling to right paraspinal area, mobile cyst, nontender, no erythema or fluctuance  Neurological: He is alert and oriented to person, place, and time.  Skin: Skin is warm and dry. He is not diaphoretic.  Nursing note and vitals reviewed.   ED Course  Procedures (including critical care time) Labs Review Labs Reviewed - No data to display  Imaging Review No results found. I have personally reviewed and evaluated these images and lab results as part of my medical decision-making.   EKG Interpretation None      MDM   Final diagnoses:  Exposure to STD  Essential hypertension    60 year old male with a history of hypertension presents with concern for exposure to STD. Patient reports his partner notified him that she tested positive for something, although he  is not sure what. Patient reports dysuria as well as penile discharge. Ordered Rocephin, azithromycin, and Flagyl empirically.  Patient declines testing for HIV or syphilis. His blood pressures elevated to 191/101, and reports he has not taken his blood pressure medications today. Patient's home blood pressure medications were ordered. He denies any symptoms to suggest a hypertensive emergency, including no chest pain, no shortness of breath, no headache, no neurologic symptoms. Patient also has an area over his back, possible sebaceous cysts or lipoma, no sign of abscess. He also has an area of swelling to his left shoulder of unclear etiology-there are no infectious signs or symptoms recommended close PCP follow-up for outpatient imaging as well as possible surgical evaluation.  Patient discharged in stable condition with understanding of reasons to return.   Gareth Morgan, MD 04/11/15 [phone]

## 2015-08-04 ENCOUNTER — Emergency Department (HOSPITAL_COMMUNITY): Payer: Medicaid Other

## 2015-08-04 ENCOUNTER — Encounter (HOSPITAL_COMMUNITY): Payer: Self-pay

## 2015-08-04 ENCOUNTER — Emergency Department (HOSPITAL_COMMUNITY)
Admission: EM | Admit: 2015-08-04 | Discharge: 2015-08-04 | Disposition: A | Discharge status: Home / Self Care | Payer: Medicaid Other | Attending: Emergency Medicine | Admitting: Emergency Medicine

## 2015-08-04 DIAGNOSIS — J029 Acute pharyngitis, unspecified: Secondary | ICD-10-CM

## 2015-08-04 DIAGNOSIS — I160 Hypertensive urgency: Secondary | ICD-10-CM

## 2015-08-04 LAB — CBC WITH DIFFERENTIAL/PLATELET
Basophils Absolute: 0 10*3/uL (ref 0.0–0.1)
Basophils Relative: 0 %
Eosinophils Absolute: 0.3 10*3/uL (ref 0.0–0.7)
Eosinophils Relative: 4 %
HCT: 38.1 % — ABNORMAL LOW (ref 39.0–52.0)
Hemoglobin: 12.6 g/dL — ABNORMAL LOW (ref 13.0–17.0)
Lymphocytes Relative: 20 %
Lymphs Abs: 1.7 10*3/uL (ref 0.7–4.0)
MCH: 29.2 pg (ref 26.0–34.0)
MCHC: 33.1 g/dL (ref 30.0–36.0)
MCV: 88.4 fL (ref 78.0–100.0)
Monocytes Absolute: 0.5 10*3/uL (ref 0.1–1.0)
Monocytes Relative: 6 %
Neutro Abs: 6.1 10*3/uL (ref 1.7–7.7)
Neutrophils Relative %: 70 %
Platelets: 187 10*3/uL (ref 150–400)
RBC: 4.31 MIL/uL (ref 4.22–5.81)
RDW: 14.3 % (ref 11.5–15.5)
WBC: 8.6 10*3/uL (ref 4.0–10.5)

## 2015-08-04 LAB — RAPID STREP SCREEN (MED CTR MEBANE ONLY): Streptococcus, Group A Screen (Direct): NEGATIVE

## 2015-08-04 LAB — I-STAT TROPONIN, ED: Troponin i, poc: 0.03 ng/mL (ref 0.00–0.08)

## 2015-08-04 LAB — BASIC METABOLIC PANEL
Anion gap: 8 (ref 5–15)
BUN: 16 mg/dL (ref 6–20)
CO2: 24 mmol/L (ref 22–32)
Calcium: 9 mg/dL (ref 8.9–10.3)
Chloride: 108 mmol/L (ref 101–111)
Creatinine, Ser: 1.22 mg/dL (ref 0.61–1.24)
GFR calc Af Amer: >60 mL/min (ref >60)
GFR calc non Af Amer: >60 mL/min (ref >60)
Glucose, Bld: 103 mg/dL — ABNORMAL HIGH (ref 65–99)
Potassium: 3.6 mmol/L (ref 3.5–5.1)
Sodium: 140 mmol/L (ref 135–145)

## 2015-08-04 MED ORDER — CHLORHEXIDINE GLUCONATE 0.12 % MT SOLN: 15.0000 mL | Freq: Two times a day (BID) | OROMUCOSAL | Status: DC

## 2015-08-04 MED ORDER — LISINOPRIL 20 MG PO TABS: 20.0000 mg | ORAL_TABLET | Freq: Every day | ORAL | Status: DC

## 2015-08-04 MED ORDER — AMLODIPINE BESYLATE 5 MG PO TABS
5.0000 mg | ORAL_TABLET | Freq: Once | ORAL | Status: AC
  Administered 2015-08-04: 5 mg via ORAL
  Filled 2015-08-04: qty 1

## 2015-08-04 MED ORDER — AMLODIPINE BESYLATE 10 MG PO TABS: 10.0000 mg | ORAL_TABLET | Freq: Every day | ORAL | Status: DC

## 2015-08-04 NOTE — ED Notes (Addendum)
Pt states that he is on BP medicine and took it last night

## 2015-08-04 NOTE — Discharge Instructions (Signed)
Your sore throat is likely to be a viral infection.  It will improve over time.  Use peridex mouth rinse for support.  Your blood pressure is high today, persistent high blood pressure can cause long term health problem including stroke or heart attack.  Please take your blood pressure as prescribed.  Use number below to find a primary care provider for further management of your health.  If your sore throat persists or worsen, please follow up with an ENT specialist or return to the ER for further care.    Sore Throat A sore throat is a painful, burning, sore, or scratchy feeling of the throat. There may be pain or tenderness when swallowing or talking. You may have other symptoms with a sore throat. These include coughing, sneezing, fever, or a swollen neck. A sore throat is often the first sign of another sickness. These sicknesses may include a cold, flu, strep throat, or an infection called mono. Most sore throats go away without medical treatment.  HOME CARE   Only take medicine as told by your doctor.  Drink enough fluids to keep your pee (urine) clear or pale yellow.  Rest as needed.  Try using throat sprays, lozenges, or suck on hard candy (if older than 4 years or as told).  Sip warm liquids, such as broth, herbal tea, or warm water with honey. Try sucking on frozen ice pops or drinking cold liquids.  Rinse the mouth (gargle) with salt water. Mix 1 teaspoon salt with 8 ounces of water.  Do not smoke. Avoid being around others when they are smoking.  Put a humidifier in your bedroom at night to moisten the air. You can also turn on a hot shower and sit in the bathroom for 5-10 minutes. Be sure the bathroom door is closed. GET HELP RIGHT AWAY IF:   You have trouble breathing.  You cannot swallow fluids, soft foods, or your spit (saliva).  You have more puffiness (swelling) in the throat.  Your sore throat does not get better in 7 days.  You feel sick to your stomach (nauseous)  and throw up (vomit).  You have a fever or lasting symptoms for more than 2-3 days.  You have a fever and your symptoms suddenly get worse. MAKE SURE YOU:   Understand these instructions.  Will watch your condition.  Will get help right away if you are not doing well or get worse.   This information is not intended to replace advice given to you by your health care provider. Make sure you discuss any questions you have with your health care provider.   Document Released: 10/19/2007 Document Revised: 10/04/2011 Document Reviewed: 09/17/2011 Elsevier Interactive Patient Education 2016 [employer].  Hypertension Hypertension is another name for high blood pressure. High blood pressure forces your heart to work harder to pump blood. A blood pressure reading has two numbers, which includes a higher number over a lower number (example: 110/72). HOME CARE   Have your blood pressure rechecked by your doctor.  Only take medicine as told by your doctor. Follow the directions carefully. The medicine does not work as well if you skip doses. Skipping doses also puts you at risk for problems.  Do not smoke.  Monitor your blood pressure at home as told by your doctor. GET HELP IF:  You think you are having a reaction to the medicine you are taking.  You have repeat headaches or feel dizzy.  You have puffiness (swelling) in your ankles.  You have trouble with your vision. GET HELP RIGHT AWAY IF:   You get a very bad headache and are confused.  You feel weak, numb, or faint.  You get chest or belly (abdominal) pain.  You throw up (vomit).  You cannot breathe very well. MAKE SURE YOU:   Understand these instructions.  Will watch your condition.  Will get help right away if you are not doing well or get worse.   This information is not intended to replace advice given to you by your health care provider. Make sure you discuss any questions you have with your health care  provider.   Document Released: 06/28/2007 Document Revised: 01/14/2013 Document Reviewed: 11/01/2012 Elsevier Interactive Patient Education [employer].

## 2015-08-04 NOTE — ED Notes (Signed)
Pt complains of a sore throat for a week but in triage his bp is extremely elevated

## 2015-08-04 NOTE — ED Provider Notes (Signed)
CSN: [id number]     Arrival date & time 08/04/15  0358 History   First MD Initiated Contact with Patient 08/04/15 0606     Chief Complaint  Patient presents with  . Sore Throat  . Hypertension     (Consider location/radiation/quality/duration/timing/severity/associated sxs/prior Treatment) HPI   60 year old male with history of hypertension presenting with complaints of sore throat.  Pt report gradual onset of persistent sore throat ongoing x 1 week.  Described as soreness, 8/10, nothing makes it better or worse.  Sore throat with swallowing.  Rinsing mouth with salt water and Listerine has not provided much relief.  Denies any association fever, headache, runny nose, sneezing, coughing, ear pain, drooling, difficulty swallowing his spit, voice changes, neck pain, chest pain, shortness of breath, abdominal pain, productive cough, lightheadedness, dizziness, focal numbness or weakness. While in the ER, it was noted that his blood pressure is elevated at 213/120. Patient admits that he has a history of high blood pressure but has not been compliant with his blood pressure medication. No prior history of stroke and heart attack.      Past Medical History  Diagnosis Date  . Hypertension    Past Surgical History  Procedure Laterality Date  . Knee surgery      x3, x1 R  . Tonsillectomy    . Appendectomy    . Hand surgery    . Eye surgery     History reviewed. No pertinent family history. Social History  Substance Use Topics  . Smoking status: Never Smoker   . Smokeless tobacco: Never Used  . Alcohol Use: Yes     Comment: socially    Review of Systems  All other systems reviewed and are negative.     Allergies  Review of patient's allergies indicates no known allergies.  Home Medications   Prior to Admission medications   Medication Sig Start Date End Date Taking? Authorizing Provider  amLODipine (NORVASC) 10 MG tablet Take 1 tablet (10 mg total) by mouth daily. 02/21/15   Yes Jaime Pilcher Ward, PA-C  lisinopril (PRINIVIL,ZESTRIL) 20 MG tablet Take 1 tablet (20 mg total) by mouth daily. 02/21/15  Yes Jaime Pilcher Ward, PA-C   BP 186/114 mmHg  Pulse 75  Temp(Src) 97.9 F (36.6 C) (Oral)  Resp 21  SpO2 98% Physical Exam  Constitutional: He is oriented to person, place, and time. He appears well-developed and well-nourished. No distress.  African-American male laying in bed in no acute discomfort, nontoxic in appearance  HENT:  Head: Atraumatic.  Ears: Right TM is mildly erythematous, left TMs normal Nose: Normal nares Throat: Uvula is not visualized, no tonsillar enlargement or exudate. Mild posterior oropharyngeal erythema noted. No trismus.  Eyes: Conjunctivae are normal.  Pterygium noted in right eye, Arcus Senilis noted in left eye  Neck: Normal range of motion. Neck supple.  No nuchal rigidity  Cardiovascular: Normal rate, regular rhythm and intact distal pulses.   Pulmonary/Chest: Effort normal and breath sounds normal.  Abdominal: Soft. There is no tenderness.  Musculoskeletal: He exhibits no edema.  Neurological: He is alert and oriented to person, place, and time. He has normal strength. No cranial nerve deficit or sensory deficit. GCS eye subscore is 4. GCS verbal subscore is 5. GCS motor subscore is 6.  Skin: No rash noted.  Psychiatric: He has a normal mood and affect.  Nursing note and vitals reviewed.   ED Course  Procedures (including critical care time) Labs Review Labs Reviewed  CBC WITH DIFFERENTIAL/PLATELET -  Abnormal; Notable for the following:    Hemoglobin 12.6 (*)    HCT 38.1 (*)    All other components within normal limits  BASIC METABOLIC PANEL - Abnormal; Notable for the following:    Glucose, Bld 103 (*)    All other components within normal limits  RAPID STREP SCREEN (NOT AT [facility])  CULTURE, GROUP A STREP [facility])  Randolm Idol, ED    Imaging Review Dg Chest 2 View  08/04/2015  CLINICAL DATA:  Sore throat  for 3 days.  Nonsmoker. EXAM: CHEST  2 VIEW COMPARISON:  09/13/2008 FINDINGS: The heart size and mediastinal contours are within normal limits. Both lungs are clear. The visualized skeletal structures are unremarkable. IMPRESSION: No active cardiopulmonary disease. Electronically Signed   By: Lucienne Capers M.D.   On: 08/04/2015 06:13   I have personally reviewed and evaluated these images and lab results as part of my medical decision-making.   EKG Interpretation   Date/Time:  Wednesday August 04 2015 05:18:47 EDT Ventricular Rate:  65 PR Interval:    QRS Duration: 107 QT Interval:  389 QTC Calculation: 405 R Axis:   83 Text Interpretation:  Sinus rhythm RSR' in V1 or V2, probably normal  variant Confirmed by [facility]  MD, APRIL ([id number]) on 08/04/2015 6:23:45  AM      MDM   Final diagnoses:  Sore throat  Asymptomatic hypertensive urgency    BP 186/114 mmHg  Pulse 75  Temp(Src) 97.9 F (36.6 C) (Oral)  Resp 21  SpO2 98%   7:30 AM Patient presents with progressive sore throat. No evidence of airway compromise concerning for deep tissue infection such as peritonsillar abscess, retropharyngeal abscess, or Ludwig angina.  No evidence of splenomegaly concerning for mononucleosis. Suspect viral pharyngitis. Will provide symptomatic treatment  Incidentally patient was noted to have an elevated blood pressure of 213/120. He did not exhibit any symptoms to suggest end organ damage from hypertensive emergency. Upon reviewing prior records, patient has had high blood pressure in the past, and have not been compliant with his blood pressure medication. I discussed with patient importance of staying compliant with his medication.  Will give outpt resources so that pt can find a PCP.  I will also refill his medication.    7:34 AM At this time I have low suspicion for acute medical emergency causing patient sore throat and hypertension. Low suspicion for ACS or stroke. Care discussed  with Dr. Randal Buba.  Domenic Moras, PA-C 08/04/15 [id number]  April Palumbo, MD 08/04/15 2340

## 2015-08-04 NOTE — ED Notes (Signed)
Pt transported to XR.  

## 2015-08-06 LAB — CULTURE, GROUP A STREP (THRC)

## 2015-08-19 ENCOUNTER — Emergency Department (HOSPITAL_COMMUNITY)
Admission: EM | Admit: 2015-08-19 | Discharge: 2015-08-19 | Disposition: A | Discharge status: Home / Self Care | Payer: Medicaid Other | Attending: Physician Assistant | Admitting: Physician Assistant

## 2015-08-19 ENCOUNTER — Emergency Department (HOSPITAL_COMMUNITY): Payer: Medicaid Other

## 2015-08-19 ENCOUNTER — Encounter (HOSPITAL_COMMUNITY): Payer: Self-pay | Admitting: Emergency Medicine

## 2015-08-19 DIAGNOSIS — Z79899 Other long term (current) drug therapy: Secondary | ICD-10-CM | POA: Diagnosis not present

## 2015-08-19 DIAGNOSIS — N433 Hydrocele, unspecified: Secondary | ICD-10-CM | POA: Insufficient documentation

## 2015-08-19 DIAGNOSIS — I10 Essential (primary) hypertension: Secondary | ICD-10-CM | POA: Diagnosis not present

## 2015-08-19 DIAGNOSIS — N451 Epididymitis: Secondary | ICD-10-CM | POA: Insufficient documentation

## 2015-08-19 DIAGNOSIS — N50811 Right testicular pain: Secondary | ICD-10-CM | POA: Diagnosis present

## 2015-08-19 DIAGNOSIS — R52 Pain, unspecified: Secondary | ICD-10-CM

## 2015-08-19 DIAGNOSIS — N50819 Testicular pain, unspecified: Secondary | ICD-10-CM

## 2015-08-19 LAB — CBC WITH DIFFERENTIAL/PLATELET
Basophils Absolute: 0 10*3/uL (ref 0.0–0.1)
Basophils Relative: 0 %
Eosinophils Absolute: 0.1 10*3/uL (ref 0.0–0.7)
Eosinophils Relative: 1 %
HCT: 39.8 % (ref 39.0–52.0)
Hemoglobin: 13.5 g/dL (ref 13.0–17.0)
Lymphocytes Relative: 10 %
Lymphs Abs: 1.2 10*3/uL (ref 0.7–4.0)
MCH: 29.7 pg (ref 26.0–34.0)
MCHC: 33.9 g/dL (ref 30.0–36.0)
MCV: 87.5 fL (ref 78.0–100.0)
Monocytes Absolute: 0.4 10*3/uL (ref 0.1–1.0)
Monocytes Relative: 3 %
Neutro Abs: 10.9 10*3/uL — ABNORMAL HIGH (ref 1.7–7.7)
Neutrophils Relative %: 86 %
Platelets: 234 10*3/uL (ref 150–400)
RBC: 4.55 MIL/uL (ref 4.22–5.81)
RDW: 14 % (ref 11.5–15.5)
WBC: 12.6 10*3/uL — ABNORMAL HIGH (ref 4.0–10.5)

## 2015-08-19 LAB — URINALYSIS, ROUTINE W REFLEX MICROSCOPIC
Bilirubin Urine: NEGATIVE
Glucose, UA: NEGATIVE mg/dL
Hgb urine dipstick: NEGATIVE
Ketones, ur: NEGATIVE mg/dL
Nitrite: POSITIVE — AB
Protein, ur: NEGATIVE mg/dL
Specific Gravity, Urine: 1.025 (ref 1.005–1.030)
pH: 6 (ref 5.0–8.0)

## 2015-08-19 LAB — URINE MICROSCOPIC-ADD ON

## 2015-08-19 LAB — BASIC METABOLIC PANEL
Anion gap: 6 (ref 5–15)
BUN: 19 mg/dL (ref 6–20)
CO2: 26 mmol/L (ref 22–32)
Calcium: 9 mg/dL (ref 8.9–10.3)
Chloride: 109 mmol/L (ref 101–111)
Creatinine, Ser: 1.24 mg/dL (ref 0.61–1.24)
GFR calc Af Amer: >60 mL/min (ref >60)
GFR calc non Af Amer: >60 mL/min (ref >60)
Glucose, Bld: 114 mg/dL — ABNORMAL HIGH (ref 65–99)
Potassium: 3.7 mmol/L (ref 3.5–5.1)
Sodium: 141 mmol/L (ref 135–145)

## 2015-08-19 MED ORDER — SODIUM CHLORIDE 0.9 % IV BOLUS (SEPSIS)
1000.0000 mL | Freq: Once | INTRAVENOUS | Status: AC
  Administered 2015-08-19: 1000 mL via INTRAVENOUS

## 2015-08-19 MED ORDER — MORPHINE SULFATE (PF) 4 MG/ML IV SOLN
4.0000 mg | Freq: Once | INTRAVENOUS | Status: AC
  Administered 2015-08-19: 4 mg via INTRAVENOUS
  Filled 2015-08-19: qty 1

## 2015-08-19 MED ORDER — ONDANSETRON HCL 4 MG/2ML IJ SOLN
4.0000 mg | Freq: Once | INTRAMUSCULAR | Status: AC
  Administered 2015-08-19: 4 mg via INTRAVENOUS
  Filled 2015-08-19: qty 2

## 2015-08-19 MED ORDER — LEVOFLOXACIN 500 MG PO TABS: 500.0000 mg | ORAL_TABLET | Freq: Every day | ORAL | 0 refills | Status: DC

## 2015-08-19 MED ORDER — HYDROCODONE-ACETAMINOPHEN 5-325 MG PO TABS: 1.0000 | ORAL_TABLET | Freq: Four times a day (QID) | ORAL | 0 refills | Status: DC | PRN

## 2015-08-19 NOTE — ED Provider Notes (Signed)
[facility] DEPT Provider Note   CSN: [id number] Arrival date & time: 08/19/15  1137  First Provider Contact:  12:30 PM    By signing my name below, I, Rayna Sexton, attest that this documentation has been prepared under the direction and in the presence of Waynetta Pean, PA-C. Electronically Signed: Rayna Sexton, ED Scribe. 08/19/15. 12:35 PM.   History   Chief Complaint Chief Complaint  Patient presents with  . Testicle Pain    HPI HPI Comments: Curtis Saunders is a 60 y.o. male with a PMHx of HTN who presents to the Emergency Department complaining of worsening, moderate, right testicle pain onset this morning. Pt states that he woke with his pain and notes that he was unable to walk to the bathroom due to the severity of his pain. He reports associated, moderate, right lower scrotal pain and mild swelling to his right scrotal region. His pain worsens with palpation of the region and movement. Pt has no known drug allergies. No history of UTIs or epididymitis. He denies any recent trauma to the region. He denies fevers, abdominal pain, n/v/d, hematuria, dysuria, penile pain, penile discharge, frequency, fevers and rectal pain.   The history is provided by the patient. No language interpreter was used.    Past Medical History:  Diagnosis Date  . Hypertension     There are no active problems to display for this patient.   Past Surgical History:  Procedure Laterality Date  . APPENDECTOMY    . EYE SURGERY    . HAND SURGERY    . KNEE SURGERY     x3, x1 R  . TONSILLECTOMY       Home Medications    Prior to Admission medications   Medication Sig Start Date End Date Taking? Authorizing Provider  lisinopril (PRINIVIL,ZESTRIL) 20 MG tablet Take 1 tablet (20 mg total) by mouth daily. 08/04/15  Yes Domenic Moras, PA-C  amLODipine (NORVASC) 10 MG tablet Take 1 tablet (10 mg total) by mouth daily. Patient not taking: Reported on 08/19/2015 08/04/15   Domenic Moras, PA-C    chlorhexidine (PERIDEX) 0.12 % solution Use as directed 15 mLs in the mouth or throat 2 (two) times daily. Patient not taking: Reported on 08/19/2015 08/04/15   Domenic Moras, PA-C  HYDROcodone-acetaminophen (NORCO) 5-325 MG tablet Take 1 tablet by mouth every 6 (six) hours as needed for moderate pain or severe pain. 08/19/15   Waynetta Pean, PA-C  levofloxacin (LEVAQUIN) 500 MG tablet Take 1 tablet (500 mg total) by mouth daily. 08/19/15   Waynetta Pean, PA-C    Family History History reviewed. No pertinent family history.  Social History Social History  Substance Use Topics  . Smoking status: Never Smoker  . Smokeless tobacco: Never Used  . Alcohol use Yes     Comment: socially     Allergies   Review of patient's allergies indicates no known allergies.   Review of Systems Review of Systems  Constitutional: Negative for chills and fever.  HENT: Negative for congestion and sore throat.   Eyes: Negative for visual disturbance.  Respiratory: Negative for cough, shortness of breath and wheezing.   Cardiovascular: Negative for chest pain and palpitations.  Gastrointestinal: Negative for abdominal pain, diarrhea, nausea, rectal pain and vomiting.  Genitourinary: Positive for scrotal swelling and testicular pain. Negative for decreased urine volume, difficulty urinating, discharge, dysuria, flank pain, frequency, hematuria, penile pain, penile swelling and urgency.  Musculoskeletal: Negative for back pain and neck pain.  Skin: Negative for rash  and wound.  Neurological: Negative for headaches.  All other systems reviewed and are negative.  Physical Exam Updated Vital Signs BP (!) 184/105   Pulse (!) 58   Temp 97.7 F (36.5 C) (Oral)   Resp 18   Ht 6' (1.829 m)   Wt 91.2 kg   SpO2 100%   BMI 27.26 kg/m   Physical Exam  Constitutional: He appears well-developed and well-nourished. No distress.  Nontoxic appearing.  HENT:  Head: Normocephalic and atraumatic.  Mouth/Throat:  Oropharynx is clear and moist.  Eyes: Conjunctivae are normal. Pupils are equal, round, and reactive to light. Right eye exhibits no discharge. Left eye exhibits no discharge.  Neck: Neck supple.  Cardiovascular: Normal rate, regular rhythm, normal heart sounds and intact distal pulses.  Exam reveals no gallop and no friction rub.   No murmur heard. Pulmonary/Chest: Effort normal and breath sounds normal. No respiratory distress. He has no wheezes. He has no rales.  Abdominal: Soft. There is no tenderness. There is no rebound and no guarding.  Abdomen is soft and nontender to palpation.  Genitourinary: Penis normal. No penile tenderness.  Genitourinary Comments: Chaperone Present Right testicle is tender and slightly higher riding than left. Mild edema noted to scrotum. No overlying skin changes to scrotum. No scrotal erythema or warmth.  TTP which hinders exam. Penis is non tender to palpation. No penile discharge. No GU rashes. No hernias noted.   Musculoskeletal: He exhibits no edema.  Lymphadenopathy:    He has no cervical adenopathy.  Neurological: He is alert. Coordination normal.  Skin: Skin is warm and dry. Capillary refill takes less than 2 seconds. No rash noted. He is not diaphoretic. No erythema. No pallor.  Psychiatric: He has a normal mood and affect. His behavior is normal.  Nursing note and vitals reviewed.   ED Treatments / Results  Labs (all labs ordered are listed, but only abnormal results are displayed) Labs Reviewed  URINALYSIS, ROUTINE W REFLEX MICROSCOPIC (NOT AT [facility]) - Abnormal; Notable for the following:       Result Value   APPearance CLOUDY (*)    Nitrite POSITIVE (*)    Leukocytes, UA SMALL (*)    All other components within normal limits  BASIC METABOLIC PANEL - Abnormal; Notable for the following:    Glucose, Bld 114 (*)    All other components within normal limits  CBC WITH DIFFERENTIAL/PLATELET - Abnormal; Notable for the following:    WBC 12.6 (*)     Neutro Abs 10.9 (*)    All other components within normal limits  URINE MICROSCOPIC-ADD ON - Abnormal; Notable for the following:    Squamous Epithelial / LPF 0-5 (*)    Bacteria, UA MANY (*)    All other components within normal limits  URINE CULTURE    EKG  EKG Interpretation None       Radiology US Scrotum  Result Date: 08/19/2015 CLINICAL DATA:  Right testicular pain for 1 day EXAM: SCROTAL ULTRASOUND DOPPLER ULTRASOUND OF THE TESTICLES TECHNIQUE: Complete ultrasound examination of the testicles, epididymis, and other scrotal structures was performed. Color and spectral Doppler ultrasound were also utilized to evaluate blood flow to the testicles. COMPARISON:  None. FINDINGS: Right testicle Measurements: 4 x 2.4 x 2.7 cm. No mass. Small hyperechoic foci within the right testicle consistent with microlithiasis. Left testicle Measurements: 3.8 x 1.7 x 2.2 cm. Mild heterogeneous echogenicity without a focal mass unchanged compared with 02/21/2015. No mass or microlithiasis visualized. Right epididymis:  Normal in size and appearance. Left epididymis:  Normal in size and appearance. Hydrocele:  Large right hydrocele.  Small left hydrocele. Varicocele:  None visualized. Pulsed Doppler interrogation of both testes demonstrates normal low resistance arterial and venous waveforms bilaterally. IMPRESSION: 1. No testicular torsion. 2. No testicular mass. 3. Large right hydrocele. Electronically Signed   By: Kathreen Devoid   On: 08/19/2015 14:10  [place] Art/ven Flow Abd Pelv Doppler  Result Date: 08/19/2015 CLINICAL DATA:  Right testicular pain for 1 day EXAM: SCROTAL ULTRASOUND DOPPLER ULTRASOUND OF THE TESTICLES TECHNIQUE: Complete ultrasound examination of the testicles, epididymis, and other scrotal structures was performed. Color and spectral Doppler ultrasound were also utilized to evaluate blood flow to the testicles. COMPARISON:  None. FINDINGS: Right testicle Measurements: 4 x 2.4 x 2.7 cm. No  mass. Small hyperechoic foci within the right testicle consistent with microlithiasis. Left testicle Measurements: 3.8 x 1.7 x 2.2 cm. Mild heterogeneous echogenicity without a focal mass unchanged compared with 02/21/2015. No mass or microlithiasis visualized. Right epididymis:  Normal in size and appearance. Left epididymis:  Normal in size and appearance. Hydrocele:  Large right hydrocele.  Small left hydrocele. Varicocele:  None visualized. Pulsed Doppler interrogation of both testes demonstrates normal low resistance arterial and venous waveforms bilaterally. IMPRESSION: 1. No testicular torsion. 2. No testicular mass. 3. Large right hydrocele. Electronically Signed   By: Kathreen Devoid   On: 08/19/2015 14:10   Procedures Procedures  DIAGNOSTIC STUDIES: Oxygen Saturation is 100% on RA, normal by my interpretation.    COORDINATION OF CARE: 12:33 PM Discussed next steps with pt. Pt verbalized understanding and is agreeable with the plan.    Medications Ordered in ED Medications  ondansetron (ZOFRAN) injection 4 mg (4 mg Intravenous Given 08/19/15 1322)  morphine 4 MG/ML injection 4 mg (4 mg Intravenous Given 08/19/15 1322)  sodium chloride 0.9 % bolus 1,000 mL (0 mLs Intravenous Stopped 08/19/15 1444)     Initial Impression / Assessment and Plan / ED Course  I have reviewed the triage vital signs and the nursing notes.  Pertinent labs & imaging results that were available during my care of the patient were reviewed by me and considered in my medical decision making (see chart for details).  Clinical Course   Patient presented to the emergency department with right pain to his scrotum and swelling to that area since today. He denies other symptoms. Denies abdominal pain, nausea or vomiting. He denies penile discharge. On exam he is afebrile nontoxic appearing. His abdomen is soft and nontender to palpation. No overlying skin changes to his genital area. He does have tenderness to his right  testicle. Tenderness hinders his examination. CBC reveals a white count of 12,000. BMP is unremarkable. Creatinine around the patient's baseline. Scrotal ultrasound returned showing no evidence of torsion. It does show a large right hydrocele. No testicular mass.   Urine returned and is nitrite positive with small leukocytes. Urine sent for culture. Concern for epididymitis with this patient's tenderness on exam and hydrocele. Will start him on Levaquin and have him follow-up with urology. Due to patient's elevated creatinine patient is provided with a short course of Norco for pain control. I discussed her specific return precautions. I advised the patient to follow-up with their primary care provider this week. I advised the patient to return to the emergency department with new or worsening symptoms or new concerns. The patient verbalized understanding and agreement with plan.    I personally performed the services  described in this documentation, which was scribed in my presence. The recorded information has been reviewed and is accurate.      Final Clinical Impressions(s) / ED Diagnoses   Final diagnoses:  Epididymitis  Testicular pain  Hydrocele in adult    New Prescriptions New Prescriptions   HYDROCODONE-ACETAMINOPHEN (NORCO) 5-325 MG TABLET    Take 1 tablet by mouth every 6 (six) hours as needed for moderate pain or severe pain.   LEVOFLOXACIN (LEVAQUIN) 500 MG TABLET    Take 1 tablet (500 mg total) by mouth daily.     Waynetta Pean, PA-C 08/19/15 1738    Courteney Julio Alm, MD 08/19/15 1800

## 2015-08-19 NOTE — ED Triage Notes (Signed)
Pt reports R side testicle pain and swelling that began upon awakening this am. Pain worse with standing and lessens with sitting/lying down. No dysuria or discharge.

## 2015-08-19 NOTE — ED Notes (Signed)
Tech at bedside collecting labs 

## 2015-08-19 NOTE — ED Notes (Signed)
Patient has attempted to void without success. Urinal at bedside.

## 2015-08-22 LAB — URINE CULTURE: Culture: >=100000 — AB

## 2015-08-23 ENCOUNTER — Telehealth (HOSPITAL_COMMUNITY): Payer: Self-pay

## 2015-08-23 NOTE — Telephone Encounter (Signed)
Post ED Visit - Positive Culture Follow-up  Culture report reviewed by antimicrobial stewardship pharmacist:  []  Elenor Quinones, Pharm.D. []  Heide Guile, Pharm.D., BCPS []  Parks Neptune, Pharm.D. []  Alycia Rossetti, Pharm.D., BCPS []  [place], Pharm.D., BCPS, AAHIVP [x]  Legrand Como, Pharm.D., BCPS, AAHIVP []  Milus Glazier, Pharm.D. []  Stephens November, Pharm.D.  Positive urine culture Treated with levofloxacin,  organism sensitive to the same and no further patient follow-up is required at this time.  Ileene Musa 08/23/2015, 4:38 PM

## 2015-10-24 ENCOUNTER — Encounter (HOSPITAL_COMMUNITY): Payer: Self-pay | Admitting: Emergency Medicine

## 2015-10-24 ENCOUNTER — Emergency Department (HOSPITAL_COMMUNITY)
Admission: EM | Admit: 2015-10-24 | Discharge: 2015-10-24 | Disposition: A | Discharge status: Home / Self Care | Payer: Medicaid Other | Attending: Emergency Medicine | Admitting: Emergency Medicine

## 2015-10-24 DIAGNOSIS — I10 Essential (primary) hypertension: Secondary | ICD-10-CM

## 2015-10-24 DIAGNOSIS — Z79899 Other long term (current) drug therapy: Secondary | ICD-10-CM | POA: Diagnosis not present

## 2015-10-24 DIAGNOSIS — R300 Dysuria: Secondary | ICD-10-CM | POA: Diagnosis not present

## 2015-10-24 LAB — URINALYSIS, ROUTINE W REFLEX MICROSCOPIC
Bilirubin Urine: NEGATIVE
Glucose, UA: NEGATIVE mg/dL
Hgb urine dipstick: NEGATIVE
Ketones, ur: NEGATIVE mg/dL
Leukocytes, UA: NEGATIVE
Nitrite: NEGATIVE
Protein, ur: NEGATIVE mg/dL
Specific Gravity, Urine: 1.019 (ref 1.005–1.030)
pH: 6.5 (ref 5.0–8.0)

## 2015-10-24 MED ORDER — LISINOPRIL 20 MG PO TABS: 20.0000 mg | ORAL_TABLET | Freq: Every day | ORAL | 1 refills | Status: DC

## 2015-10-24 MED ORDER — LABETALOL HCL 100 MG PO TABS
100.0000 mg | ORAL_TABLET | Freq: Once | ORAL | Status: AC
  Administered 2015-10-24: 100 mg via ORAL
  Filled 2015-10-24: qty 1

## 2015-10-24 MED ORDER — CIPROFLOXACIN HCL 500 MG PO TABS: 500.0000 mg | ORAL_TABLET | Freq: Two times a day (BID) | ORAL | 0 refills | Status: DC

## 2015-10-24 MED ORDER — AMLODIPINE BESYLATE 10 MG PO TABS: 10.0000 mg | ORAL_TABLET | Freq: Every day | ORAL | 1 refills | Status: DC

## 2015-10-24 NOTE — Discharge Instructions (Signed)
Resume your lisinopril and Norvasc as before.  Begin taking Cipro as prescribed.  Follow-up with your primary Dr. for a blood pressure recheck within the next week.

## 2015-10-24 NOTE — ED Provider Notes (Signed)
[place] DEPT Provider Note   CSN: [id number] Arrival date & time: 10/24/15  0804     History   Chief Complaint Chief Complaint  Patient presents with  . Hematuria  . Penile Discharge  . Hypertension    HPI Curtis Saunders is a 60 y.o. male.  Patient is a 60 year old male who presents with 2 complaints. He has a history of hypertension and has been out of his blood pressure medication for the past 2 weeks. He reports not generally feeling well, however denies any specific complaints such as chest pain, shortness of breath, or headaches. His blood pressure has been running high at home.  He is also complaining of flank pain bilaterally and some discomfort with urination. He denies any fevers or chills. He reports a history of a prior urinary tract infection and this feels similar. His symptoms are worse with urination and there are no alleviating factors.   The history is provided by the patient.    Past Medical History:  Diagnosis Date  . Hypertension     There are no active problems to display for this patient.   Past Surgical History:  Procedure Laterality Date  . APPENDECTOMY    . EYE SURGERY    . HAND SURGERY    . KNEE SURGERY     x3, x1 R  . TONSILLECTOMY         Home Medications    Prior to Admission medications   Medication Sig Start Date End Date Taking? Authorizing Provider  amLODipine (NORVASC) 10 MG tablet Take 1 tablet (10 mg total) by mouth daily. Patient not taking: Reported on 08/19/2015 08/04/15   Domenic Moras, PA-C  chlorhexidine (PERIDEX) 0.12 % solution Use as directed 15 mLs in the mouth or throat 2 (two) times daily. Patient not taking: Reported on 08/19/2015 08/04/15   Domenic Moras, PA-C  HYDROcodone-acetaminophen (NORCO) 5-325 MG tablet Take 1 tablet by mouth every 6 (six) hours as needed for moderate pain or severe pain. 08/19/15   Waynetta Pean, PA-C  levofloxacin (LEVAQUIN) 500 MG tablet Take 1 tablet (500 mg total) by mouth daily.  08/19/15   Waynetta Pean, PA-C  lisinopril (PRINIVIL,ZESTRIL) 20 MG tablet Take 1 tablet (20 mg total) by mouth daily. 08/04/15   Domenic Moras, PA-C    Family History History reviewed. No pertinent family history.  Social History Social History  Substance Use Topics  . Smoking status: Never Smoker  . Smokeless tobacco: Never Used  . Alcohol use No     Comment: socially     Allergies   Review of patient's allergies indicates no known allergies.   Review of Systems Review of Systems  All other systems reviewed and are negative.    Physical Exam Updated Vital Signs BP (!) 211/121 (BP Location: Right Arm)   Pulse 63   Temp 97.9 F (36.6 C) (Oral)   Resp 20   SpO2 96%   Physical Exam  Constitutional: He is oriented to person, place, and time. He appears well-developed and well-nourished. No distress.  HENT:  Head: Normocephalic and atraumatic.  Mouth/Throat: Oropharynx is clear and moist.  Neck: Normal range of motion. Neck supple.  Cardiovascular: Normal rate and regular rhythm.  Exam reveals no friction rub.   No murmur heard. Pulmonary/Chest: Effort normal and breath sounds normal. No respiratory distress. He has no wheezes. He has no rales.  Abdominal: Soft. Bowel sounds are normal. He exhibits no distension. There is no tenderness.  Musculoskeletal: Normal range of  motion. He exhibits no edema.  Neurological: He is alert and oriented to person, place, and time. No cranial nerve deficit. He exhibits normal muscle tone. Coordination normal.  Skin: Skin is warm and dry. He is not diaphoretic.  Nursing note and vitals reviewed.    ED Treatments / Results  Labs (all labs ordered are listed, but only abnormal results are displayed) Labs Reviewed  URINALYSIS, ROUTINE W REFLEX MICROSCOPIC (NOT AT [facility])    EKG  EKG Interpretation None       Radiology No results found.  Procedures Procedures (including critical care time)  Medications Ordered in  ED Medications  labetalol (NORMODYNE) tablet 100 mg (not administered)     Initial Impression / Assessment and Plan / ED Course  I have reviewed the triage vital signs and the nursing notes.  Pertinent labs & imaging results that were available during my care of the patient were reviewed by me and considered in my medical decision making (see chart for details).  Clinical Course    Patient presents with burning with urination. He is concerned he may have a urinary tract infection. His urine is clear, however he has a history of UTI with similar symptoms. He has also been diagnosed with epididymitis in the past. He will be treated with Cipro and when necessary return.  His blood pressures were also significantly elevated in the department. He ran out of his blood pressure medication 2 weeks ago. He was given a dose of labetalol here in the ER which had some improvement. He will be discharged with refills of his lisinopril and Norvasc.  Final Clinical Impressions(s) / ED Diagnoses   Final diagnoses:  None    New Prescriptions New Prescriptions   No medications on file     Veryl Speak, MD 10/24/15 1053

## 2015-10-24 NOTE — ED Triage Notes (Signed)
Pt states he has been without bp meds x 2 weeks. Pt c/o blurred vision, has not had bp checked and unable to see primary MD. Pt states he has penile pain and hematuria and flank pain x 1 week.

## 2015-12-11 ENCOUNTER — Encounter (HOSPITAL_COMMUNITY): Payer: Self-pay | Admitting: Emergency Medicine

## 2015-12-11 ENCOUNTER — Emergency Department (HOSPITAL_COMMUNITY)
Admission: EM | Admit: 2015-12-11 | Discharge: 2015-12-11 | Disposition: A | Discharge status: Home / Self Care | Payer: Medicaid Other | Attending: Emergency Medicine | Admitting: Emergency Medicine

## 2015-12-11 DIAGNOSIS — I10 Essential (primary) hypertension: Secondary | ICD-10-CM | POA: Insufficient documentation

## 2015-12-11 DIAGNOSIS — Z760 Encounter for issue of repeat prescription: Secondary | ICD-10-CM | POA: Insufficient documentation

## 2015-12-11 DIAGNOSIS — Z79899 Other long term (current) drug therapy: Secondary | ICD-10-CM | POA: Diagnosis not present

## 2015-12-11 MED ORDER — AMLODIPINE BESYLATE 5 MG PO TABS
5.0000 mg | ORAL_TABLET | Freq: Once | ORAL | Status: AC
  Administered 2015-12-11: 5 mg via ORAL
  Filled 2015-12-11: qty 1

## 2015-12-11 MED ORDER — AMLODIPINE BESYLATE 10 MG PO TABS: 10.0000 mg | ORAL_TABLET | Freq: Every day | ORAL | 0 refills | Status: DC

## 2015-12-11 NOTE — ED Triage Notes (Addendum)
Pt comes in to ED w/ /co hypertension. States he checked his BP at [facility] and it was "201 over something". Pt has hx of hypertension. Pt states he is out of his blood pressure medicine approx x2 wks. Pt states he drinks apple cider vinegar to try to help reduce BP x1wk.  Denies SOB, dizziness, nausea/vomiting, chest palpitations, and headache. Pt confirms blurry vision, eyes are blood shot in triage.

## 2015-12-11 NOTE — ED Provider Notes (Signed)
[facility] DEPT Provider Note   CSN: [id number] Arrival date & time: 12/11/15  0112  By signing my name below, I, Maud Deed. Royston Sinner, attest that this documentation has been prepared under the direction and in the presence of Randle Shatzer, MD.  Electronically Signed: Maud Deed. Royston Sinner, ED Scribe. 12/11/15. 3:20 AM.   History   Chief Complaint Chief Complaint  Patient presents with  . Hypertension   The history is provided by the patient. No language interpreter was used.  Hypertension  This is a recurrent problem. The current episode started 6 to 12 hours ago. The problem occurs constantly. The problem has not changed since onset.Pertinent negatives include no chest pain, no abdominal pain, no headaches and no shortness of breath. Nothing aggravates the symptoms. Nothing relieves the symptoms. He has tried nothing for the symptoms. The treatment provided no relief.    HPI Comments: EMERZON VANDERWOOD is a 60 y.o. male with a PMHx of HTN who presents to the Emergency Department here for hypertension this evening. Pt states he went to [facility] to check his blood pressure with a reading of [id number] systolic. He admits he has been out of his blood pressure medications for 3 weeks now. He states he has attempted drinking apple cider vinegar in hopes of bringing down his blood pressure without any improvement. Pt denies any recent fever, chills, nausea, vomiting, chest pain, shortness of breath, dizziness, or HA. No syncopal episodes reported. Pt is requesting a refill of his medication this evening.  PCP: [name] Patient    Past Medical History:  Diagnosis Date  . Hypertension     There are no active problems to display for this patient.   Past Surgical History:  Procedure Laterality Date  . APPENDECTOMY    . EYE SURGERY    . HAND SURGERY    . KNEE SURGERY     x3, x1 R  . TONSILLECTOMY         Home Medications    Prior to Admission medications   Medication Sig Start Date End  Date Taking? Authorizing Provider  amLODipine (NORVASC) 10 MG tablet Take 1 tablet (10 mg total) by mouth daily. 10/24/15  Yes Veryl Speak, MD  lisinopril (PRINIVIL,ZESTRIL) 20 MG tablet Take 1 tablet (20 mg total) by mouth daily. 10/24/15  Yes Veryl Speak, MD  chlorhexidine (PERIDEX) 0.12 % solution Use as directed 15 mLs in the mouth or throat 2 (two) times daily. Patient not taking: Reported on 12/11/2015 08/04/15   Domenic Moras, PA-C  ciprofloxacin (CIPRO) 500 MG tablet Take 1 tablet (500 mg total) by mouth 2 (two) times daily. Patient not taking: Reported on 12/11/2015 10/24/15   Veryl Speak, MD  HYDROcodone-acetaminophen (NORCO) 5-325 MG tablet Take 1 tablet by mouth every 6 (six) hours as needed for moderate pain or severe pain. Patient not taking: Reported on 12/11/2015 08/19/15   Waynetta Pean, PA-C  levofloxacin (LEVAQUIN) 500 MG tablet Take 1 tablet (500 mg total) by mouth daily. Patient not taking: Reported on 12/11/2015 08/19/15   Waynetta Pean, PA-C    Family History History reviewed. No pertinent family history.  Social History Social History  Substance Use Topics  . Smoking status: Never Smoker  . Smokeless tobacco: Never Used  . Alcohol use No     Comment: socially     Allergies   Bee venom   Review of Systems Review of Systems  Constitutional: Negative for chills and fever.  HENT: Negative for facial swelling.  Respiratory: Negative for shortness of breath.   Cardiovascular: Negative for chest pain, palpitations and leg swelling.  Gastrointestinal: Negative for abdominal pain.  Neurological: Negative for dizziness, syncope, speech difficulty, light-headedness, numbness and headaches.  Psychiatric/Behavioral: Negative for confusion.  All other systems reviewed and are negative.    Physical Exam Updated Vital Signs BP (!) 184/110   Pulse (!) 55   Temp 97.7 F (36.5 C) (Oral)   Resp 17   Ht 6' (1.829 m)   Wt 210 lb (95.3 kg)   SpO2 100%   BMI 28.48  kg/m   Physical Exam  Constitutional: He is oriented to person, place, and time. He appears well-developed and well-nourished. No distress.  HENT:  Head: Normocephalic and atraumatic.  Mouth/Throat: Oropharynx is clear and moist. No oropharyngeal exudate.  Eyes: EOM are normal. Pupils are equal, round, and reactive to light.  Neck: Normal range of motion. Neck supple. No JVD present.  Trachea is midline. No carotid bruits.  Cardiovascular: Normal rate, regular rhythm, normal heart sounds and intact distal pulses.   Pulmonary/Chest: Effort normal and breath sounds normal. No stridor.  Abdominal: Soft. Bowel sounds are normal. He exhibits no mass. There is no tenderness. There is no rebound and no guarding.  Musculoskeletal: Normal range of motion. He exhibits no edema.  Neurological: He is alert and oriented to person, place, and time. He displays normal reflexes. No cranial nerve deficit.  Intact DTRs.  Skin: Skin is warm and dry. Capillary refill takes less than 2 seconds. He is not diaphoretic.  Psychiatric: He has a normal mood and affect. His behavior is normal. Judgment and thought content normal.  Nursing note and vitals reviewed.    ED Treatments / Results   Vitals:   12/11/15 0200 12/11/15 0230  BP: (!) 194/117 (!) 184/110  Pulse: (!) 57 (!) 55  Resp:    Temp:      DIAGNOSTIC STUDIES: Oxygen Saturation is 100% on RA, Normal by my interpretation.    COORDINATION OF CARE: 3:20 AM- Will give Norvasc. Discussed treatment plan with pt at bedside and pt agreed to plan.    Procedures Procedures (including critical care time)  Medications Ordered in ED  Medications  amLODipine (NORVASC) tablet 5 mg (not administered)     Final Clinical Impressions(s) / ED Diagnoses   Medication non compliance and ongoing hypertension not following up. Has been informed that he must follow up with his PMD to get his meds adjusted. All questions answered to patient's satisfaction.  Based on history and exam patient has been appropriately medically screened and emergency conditions excluded. Patient is stable for discharge at this time. Follow up with your PMD for recheck in 2 days and strict return precautions given.   I personally performed the services described in this documentation, which was scribed in my presence. The recorded information has been reviewed and is accurate.      Veatrice Kells, MD 12/11/15 [phone]

## 2016-05-31 ENCOUNTER — Emergency Department (HOSPITAL_COMMUNITY)
Admission: EM | Admit: 2016-05-31 | Discharge: 2016-05-31 | Disposition: A | Discharge status: Home / Self Care | Payer: Medicaid Other | Attending: Emergency Medicine | Admitting: Emergency Medicine

## 2016-05-31 ENCOUNTER — Encounter (HOSPITAL_COMMUNITY): Payer: Self-pay | Admitting: Emergency Medicine

## 2016-05-31 DIAGNOSIS — I10 Essential (primary) hypertension: Secondary | ICD-10-CM | POA: Diagnosis not present

## 2016-05-31 DIAGNOSIS — H9201 Otalgia, right ear: Secondary | ICD-10-CM | POA: Diagnosis not present

## 2016-05-31 DIAGNOSIS — Z79899 Other long term (current) drug therapy: Secondary | ICD-10-CM | POA: Diagnosis not present

## 2016-05-31 MED ORDER — NAPROXEN 500 MG PO TABS: 500.0000 mg | ORAL_TABLET | Freq: Two times a day (BID) | ORAL | 0 refills | Status: DC

## 2016-05-31 MED ORDER — OXYCODONE-ACETAMINOPHEN 5-325 MG PO TABS
1.0000 | ORAL_TABLET | Freq: Once | ORAL | Status: AC
  Administered 2016-05-31: 1 via ORAL
  Filled 2016-05-31: qty 1

## 2016-05-31 MED ORDER — LORATADINE 10 MG PO TABS: 10.0000 mg | ORAL_TABLET | Freq: Every day | ORAL | 0 refills | Status: DC

## 2016-05-31 NOTE — ED Triage Notes (Signed)
Pt c/o right ear pain, fullness, sensation of foreign body that is moving, dizziness noted on waking this morning. Scant cerumen noted to ear canal, no other objects. TM pearly gray, full without erythema, cone of light at 5-o'clock.

## 2016-05-31 NOTE — ED Provider Notes (Signed)
[name] DEPT Provider Note   CSN: [id number] Arrival date & time: 05/31/16  1713     History   Chief Complaint Chief Complaint  Patient presents with  . Otalgia  . Dizziness    HPI Curtis Saunders is a 61 y.o. male.  61 year old male presents 1 day history of right ear pain fullness as well as formed by sensation. Feels as if something is moving in his ear. Denies any drainage from the ear. No fever or chills. No hearing loss. No treatment use prior to arrival.      Past Medical History:  Diagnosis Date  . Hypertension     There are no active problems to display for this patient.   Past Surgical History:  Procedure Laterality Date  . APPENDECTOMY    . EYE SURGERY    . HAND SURGERY    . KNEE SURGERY     x3, x1 R  . TONSILLECTOMY         Home Medications    Prior to Admission medications   Medication Sig Start Date End Date Taking? Authorizing Provider  amLODipine (NORVASC) 10 MG tablet Take 1 tablet (10 mg total) by mouth daily. 12/11/15  Yes Palumbo, April, MD  lisinopril (PRINIVIL,ZESTRIL) 20 MG tablet Take 1 tablet (20 mg total) by mouth daily. 10/24/15  Yes Veryl Speak, MD    Family History History reviewed. No pertinent family history.  Social History Social History  Substance Use Topics  . Smoking status: Never Smoker  . Smokeless tobacco: Never Used  . Alcohol use No     Comment: socially     Allergies   Bee venom   Review of Systems Review of Systems  All other systems reviewed and are negative.    Physical Exam Updated Vital Signs BP (!) 156/79 (BP Location: Left Arm)   Pulse 87   Temp 99.2 F (37.3 C) (Oral)   Resp 18   Ht 5\' 11"  (1.803 m)   Wt 75.8 kg   SpO2 96%   BMI 23.29 kg/m   Physical Exam  Constitutional: He is oriented to person, place, and time. He appears well-developed and well-nourished.  Non-toxic appearance. No distress.  HENT:  Head: Normocephalic and atraumatic.  Right Ear: Hearing and  external ear normal. No foreign bodies. No mastoid tenderness. Tympanic membrane is not injected and not perforated.  Eyes: Conjunctivae, EOM and lids are normal. Pupils are equal, round, and reactive to light.  Neck: Normal range of motion. Neck supple. No tracheal deviation present. No thyroid mass present.  Cardiovascular: Normal rate, regular rhythm and normal heart sounds.  Exam reveals no gallop.   No murmur heard. Pulmonary/Chest: Effort normal and breath sounds normal. No stridor. No respiratory distress. He has no decreased breath sounds. He has no wheezes. He has no rhonchi. He has no rales.  Abdominal: Soft. Normal appearance and bowel sounds are normal. He exhibits no distension. There is no tenderness. There is no rebound and no CVA tenderness.  Musculoskeletal: Normal range of motion. He exhibits no edema or tenderness.  Neurological: He is alert and oriented to person, place, and time. He has normal strength. No cranial nerve deficit or sensory deficit. GCS eye subscore is 4. GCS verbal subscore is 5. GCS motor subscore is 6.  Skin: Skin is warm and dry. No abrasion and no rash noted.  Psychiatric: He has a normal mood and affect. His speech is normal and behavior is normal.  Nursing note and vitals reviewed.  ED Treatments / Results  Labs (all labs ordered are listed, but only abnormal results are displayed) Labs Reviewed - No data to display  EKG  EKG Interpretation None       Radiology No results found.  Procedures Procedures (including critical care time)  Medications Ordered in ED Medications  oxyCODONE-acetaminophen (PERCOCET/ROXICET) 5-325 MG per tablet 1 tablet (not administered)     Initial Impression / Assessment and Plan / ED Course  I have reviewed the triage vital signs and the nursing notes.  Pertinent labs & imaging results that were available during my care of the patient were reviewed by me and considered in my medical decision making (see  chart for details).     Patient to be given symptomatically treatment for his right ear pain  Final Clinical Impressions(s) / ED Diagnoses   Final diagnoses:  None    New Prescriptions New Prescriptions   No medications on file     Lacretia Leigh, MD 05/31/16 2049

## 2016-08-07 ENCOUNTER — Encounter (HOSPITAL_COMMUNITY): Payer: Self-pay | Admitting: Emergency Medicine

## 2016-08-07 DIAGNOSIS — Z9114 Patient's other noncompliance with medication regimen: Secondary | ICD-10-CM | POA: Insufficient documentation

## 2016-08-07 DIAGNOSIS — R51 Headache: Secondary | ICD-10-CM | POA: Insufficient documentation

## 2016-08-07 DIAGNOSIS — I10 Essential (primary) hypertension: Secondary | ICD-10-CM | POA: Diagnosis not present

## 2016-08-07 DIAGNOSIS — H538 Other visual disturbances: Secondary | ICD-10-CM | POA: Insufficient documentation

## 2016-08-07 DIAGNOSIS — R42 Dizziness and giddiness: Secondary | ICD-10-CM | POA: Diagnosis not present

## 2016-08-07 DIAGNOSIS — Z79899 Other long term (current) drug therapy: Secondary | ICD-10-CM | POA: Diagnosis not present

## 2016-08-07 LAB — CBG MONITORING, ED: Glucose-Capillary: 111 mg/dL — ABNORMAL HIGH (ref 65–99)

## 2016-08-07 NOTE — ED Triage Notes (Signed)
Pt reports having headache and dizziness for the last several days after being out of blood pressure medication for the last month.

## 2016-08-08 ENCOUNTER — Emergency Department (HOSPITAL_COMMUNITY)
Admission: EM | Admit: 2016-08-08 | Discharge: 2016-08-08 | Disposition: A | Discharge status: Home / Self Care | Payer: Medicaid Other | Attending: Emergency Medicine | Admitting: Emergency Medicine

## 2016-08-08 DIAGNOSIS — R519 Headache, unspecified: Secondary | ICD-10-CM

## 2016-08-08 DIAGNOSIS — R51 Headache: Secondary | ICD-10-CM

## 2016-08-08 DIAGNOSIS — I10 Essential (primary) hypertension: Secondary | ICD-10-CM

## 2016-08-08 LAB — BASIC METABOLIC PANEL
Anion gap: 6 (ref 5–15)
BUN: 15 mg/dL (ref 6–20)
CO2: 28 mmol/L (ref 22–32)
Calcium: 9.7 mg/dL (ref 8.9–10.3)
Chloride: 106 mmol/L (ref 101–111)
Creatinine, Ser: 1.12 mg/dL (ref 0.61–1.24)
GFR calc Af Amer: >60 mL/min (ref >60)
GFR calc non Af Amer: >60 mL/min (ref >60)
Glucose, Bld: 108 mg/dL — ABNORMAL HIGH (ref 65–99)
Potassium: 4 mmol/L (ref 3.5–5.1)
Sodium: 140 mmol/L (ref 135–145)

## 2016-08-08 LAB — CBC WITH DIFFERENTIAL/PLATELET
Basophils Absolute: 0 10*3/uL (ref 0.0–0.1)
Basophils Relative: 0 %
Eosinophils Absolute: 0.4 10*3/uL (ref 0.0–0.7)
Eosinophils Relative: 5 %
HCT: 37.5 % — ABNORMAL LOW (ref 39.0–52.0)
Hemoglobin: 12.7 g/dL — ABNORMAL LOW (ref 13.0–17.0)
Lymphocytes Relative: 30 %
Lymphs Abs: 2.4 10*3/uL (ref 0.7–4.0)
MCH: 29.7 pg (ref 26.0–34.0)
MCHC: 33.9 g/dL (ref 30.0–36.0)
MCV: 87.6 fL (ref 78.0–100.0)
Monocytes Absolute: 0.5 10*3/uL (ref 0.1–1.0)
Monocytes Relative: 6 %
Neutro Abs: 4.8 10*3/uL (ref 1.7–7.7)
Neutrophils Relative %: 59 %
Platelets: 177 10*3/uL (ref 150–400)
RBC: 4.28 MIL/uL (ref 4.22–5.81)
RDW: 14.2 % (ref 11.5–15.5)
WBC: 8.2 10*3/uL (ref 4.0–10.5)

## 2016-08-08 LAB — URINALYSIS, ROUTINE W REFLEX MICROSCOPIC
Bilirubin Urine: NEGATIVE
Glucose, UA: NEGATIVE mg/dL
Hgb urine dipstick: NEGATIVE
Ketones, ur: NEGATIVE mg/dL
Nitrite: NEGATIVE
Protein, ur: NEGATIVE mg/dL
Specific Gravity, Urine: 1.012 (ref 1.005–1.030)
Squamous Epithelial / HPF: NONE SEEN
pH: 6 (ref 5.0–8.0)

## 2016-08-08 MED ORDER — LISINOPRIL 20 MG PO TABS: 20.0000 mg | ORAL_TABLET | Freq: Every day | ORAL | 0 refills | Status: DC

## 2016-08-08 MED ORDER — LISINOPRIL 20 MG PO TABS
20.0000 mg | ORAL_TABLET | Freq: Once | ORAL | Status: AC
  Administered 2016-08-08: 20 mg via ORAL
  Filled 2016-08-08: qty 1

## 2016-08-08 MED ORDER — ACETAMINOPHEN 325 MG PO TABS
650.0000 mg | ORAL_TABLET | Freq: Once | ORAL | Status: AC
  Administered 2016-08-08: 650 mg via ORAL
  Filled 2016-08-08: qty 2

## 2016-08-08 NOTE — ED Provider Notes (Signed)
[facility] DEPT Provider Note   CSN: [id number] Arrival date & time: 08/07/16  2258  By signing my name below, I, Ny'Kea Lewis, attest that this documentation has been prepared under the direction and in the presence of Delora Fuel, MD. Electronically Signed: Lise Auer, ED Scribe. 08/08/16. 2:11 AM.  History   Chief Complaint Chief Complaint  Patient presents with  . Hypertension   The history is provided by the patient. No language interpreter was used.   HPI  HPI Comments: Curtis Saunders is a 61 y.o. male who presents to the Emergency Department complaining of gradual onset, gradually worsening, persistent "achy" frontal headache that began at 9 pm, he rates the severity of his pain a 9/10. He notes associated blurred vision and dizziness. Pt reports he has been without his hypertension medication for the past month. Pt states he has not had his medication filled due to not having a PCP. No treatment tried PTA. Denies tobacco usage. Denies epistaxis or ear pain. No other associated symptoms noted at this time.   Past Medical History:  Diagnosis Date  . Hypertension    There are no active problems to display for this patient.  Past Surgical History:  Procedure Laterality Date  . APPENDECTOMY    . EYE SURGERY    . HAND SURGERY    . KNEE SURGERY     x3, x1 R  . TONSILLECTOMY      Home Medications    Prior to Admission medications   Medication Sig Start Date End Date Taking? Authorizing Provider  amLODipine (NORVASC) 10 MG tablet Take 1 tablet (10 mg total) by mouth daily. 12/11/15   Palumbo, April, MD  lisinopril (PRINIVIL,ZESTRIL) 20 MG tablet Take 1 tablet (20 mg total) by mouth daily. 10/24/15   Veryl Speak, MD  loratadine (CLARITIN) 10 MG tablet Take 1 tablet (10 mg total) by mouth daily. 05/31/16   Lacretia Leigh, MD  naproxen (NAPROSYN) 500 MG tablet Take 1 tablet (500 mg total) by mouth 2 (two) times daily. 05/31/16   Lacretia Leigh, MD    Family  History History reviewed. No pertinent family history.  Social History Social History  Substance Use Topics  . Smoking status: Never Smoker  . Smokeless tobacco: Never Used  . Alcohol use No     Comment: socially   Allergies   Bee venom  Review of Systems Review of Systems  HENT: Negative for ear pain and nosebleeds.   Eyes: Positive for visual disturbance.  Neurological: Positive for dizziness and headaches.  All other systems reviewed and are negative.  Physical Exam Updated Vital Signs BP (!) 216/117   Pulse (!) 59   Temp 98.2 F (36.8 C) (Oral)   Resp 18   Ht 5\' 11"  (1.803 m)   Wt 189 lb 6.4 oz (85.9 kg)   SpO2 100%   BMI 26.42 kg/m   Physical Exam  Constitutional: He is oriented to person, place, and time. He appears well-developed and well-nourished.  HENT:  Head: Normocephalic and atraumatic.  Eyes: Pupils are equal, round, and reactive to light. EOM are normal.  Fundi shows no hemorrhage or exudate. Arteriola narrowing noted.   Neck: Normal range of motion. Neck supple. No JVD present.  Cardiovascular: Normal rate, regular rhythm and normal heart sounds.   No murmur heard. Pulmonary/Chest: Effort normal and breath sounds normal. He has no wheezes. He has no rales. He exhibits no tenderness.  Abdominal: Soft. Bowel sounds are normal. He exhibits no distension and  no mass. There is no tenderness.  Musculoskeletal: Normal range of motion. He exhibits no edema.  Lymphadenopathy:    He has no cervical adenopathy.  Neurological: He is alert and oriented to person, place, and time. No cranial nerve deficit. He exhibits normal muscle tone. Coordination normal.  Skin: Skin is warm and dry. No rash noted.  Psychiatric: He has a normal mood and affect. His behavior is normal. Judgment and thought content normal.  Nursing note and vitals reviewed.  ED Treatments / Results  DIAGNOSTIC STUDIES: Oxygen Saturation is 100% on RA, normal by my interpretation.    COORDINATION OF CARE: 2:08 AM-Discussed next steps with pt. Pt verbalized understanding and is agreeable with the plan.   Labs (all labs ordered are listed, but only abnormal results are displayed) Labs Reviewed  BASIC METABOLIC PANEL - Abnormal; Notable for the following:       Result Value   Glucose, Bld 108 (*)    All other components within normal limits  CBC WITH DIFFERENTIAL/PLATELET - Abnormal; Notable for the following:    Hemoglobin 12.7 (*)    HCT 37.5 (*)    All other components within normal limits  URINALYSIS, ROUTINE W REFLEX MICROSCOPIC - Abnormal; Notable for the following:    APPearance HAZY (*)    Leukocytes, UA MODERATE (*)    Bacteria, UA RARE (*)    All other components within normal limits  CBG MONITORING, ED - Abnormal; Notable for the following:    Glucose-Capillary 111 (*)    All other components within normal limits   Procedures Procedures (including critical care time)  Medications Ordered in ED Medications  lisinopril (PRINIVIL,ZESTRIL) tablet 20 mg (20 mg Oral Given 08/08/16 0233)  acetaminophen (TYLENOL) tablet 650 mg (650 mg Oral Given 08/08/16 0233)     Initial Impression / Assessment and Plan / ED Course  I have reviewed the triage vital signs and the nursing notes.  Pertinent lab results that were available during my care of the patient were reviewed by me and considered in my medical decision making (see chart for details).  Poorly controlled hypertension secondary to medication noncompliance. Headache which I do not feel is likely to be from blood pressure. Old records are reviewed, and he has multiple ED visits with high blood pressure. On several prior visits, he had stopped taking his medication because he had run out of it. Of note, even though medication lists show he had been taking it for at least the past 3 years, patient denies ever having taken amlodipine. He is given a dose of acetaminophen and given his usual dose of lisinopril.  Following this, headache has improved, but blood pressure has not changed significantly. This is not unexpected, since antihypertensive medications frequent take 1-2 weeks to show effect. He is discharged with prescription for lisinopril and is also given resources to try to establish primary care. Advised that he may need to be an additional medication of lisinopril is not keeping his blood pressure adequately controlled.  Final Clinical Impressions(s) / ED Diagnoses   Final diagnoses:  Essential hypertension  Headache, unspecified headache type   New Prescriptions Current Discharge Medication List     I personally performed the services described in this documentation, which was scribed in my presence. The recorded information has been reviewed and is accurate.       Delora Fuel, MD 87/56/43 [phone]

## 2016-08-08 NOTE — Discharge Instructions (Signed)
Please monitor your blood pressure at home. If it is not improving after 1-2 weeks, you will need additional medication to control your blood pressure.

## 2017-01-06 ENCOUNTER — Emergency Department (HOSPITAL_COMMUNITY): Payer: Medicaid Other

## 2017-01-06 ENCOUNTER — Emergency Department (HOSPITAL_COMMUNITY)
Admission: EM | Admit: 2017-01-06 | Discharge: 2017-01-06 | Disposition: A | Discharge status: Home / Self Care | Payer: Medicaid Other | Attending: Emergency Medicine | Admitting: Emergency Medicine

## 2017-01-06 ENCOUNTER — Encounter (HOSPITAL_COMMUNITY): Payer: Self-pay | Admitting: Emergency Medicine

## 2017-01-06 DIAGNOSIS — N3000 Acute cystitis without hematuria: Secondary | ICD-10-CM | POA: Insufficient documentation

## 2017-01-06 DIAGNOSIS — I10 Essential (primary) hypertension: Secondary | ICD-10-CM | POA: Diagnosis not present

## 2017-01-06 DIAGNOSIS — R519 Headache, unspecified: Secondary | ICD-10-CM

## 2017-01-06 DIAGNOSIS — R51 Headache: Secondary | ICD-10-CM | POA: Insufficient documentation

## 2017-01-06 LAB — CBC WITH DIFFERENTIAL/PLATELET
Basophils Absolute: 0 10*3/uL (ref 0.0–0.1)
Basophils Relative: 0 %
Eosinophils Absolute: 0.2 10*3/uL (ref 0.0–0.7)
Eosinophils Relative: 2 %
HCT: 37.8 % — ABNORMAL LOW (ref 39.0–52.0)
Hemoglobin: 12.5 g/dL — ABNORMAL LOW (ref 13.0–17.0)
Lymphocytes Relative: 13 %
Lymphs Abs: 1.4 10*3/uL (ref 0.7–4.0)
MCH: 29.7 pg (ref 26.0–34.0)
MCHC: 33.1 g/dL (ref 30.0–36.0)
MCV: 89.8 fL (ref 78.0–100.0)
Monocytes Absolute: 0.3 10*3/uL (ref 0.1–1.0)
Monocytes Relative: 3 %
Neutro Abs: 9.1 10*3/uL — ABNORMAL HIGH (ref 1.7–7.7)
Neutrophils Relative %: 82 %
Platelets: 198 10*3/uL (ref 150–400)
RBC: 4.21 MIL/uL — ABNORMAL LOW (ref 4.22–5.81)
RDW: 14 % (ref 11.5–15.5)
WBC: 11 10*3/uL — ABNORMAL HIGH (ref 4.0–10.5)

## 2017-01-06 LAB — URINALYSIS, ROUTINE W REFLEX MICROSCOPIC
Bilirubin Urine: NEGATIVE
Glucose, UA: NEGATIVE mg/dL
Hgb urine dipstick: NEGATIVE
Ketones, ur: NEGATIVE mg/dL
Nitrite: NEGATIVE
Protein, ur: NEGATIVE mg/dL
Specific Gravity, Urine: 1.017 (ref 1.005–1.030)
pH: 6 (ref 5.0–8.0)

## 2017-01-06 LAB — BASIC METABOLIC PANEL
Anion gap: 6 (ref 5–15)
BUN: 16 mg/dL (ref 6–20)
CO2: 24 mmol/L (ref 22–32)
Calcium: 9.1 mg/dL (ref 8.9–10.3)
Chloride: 110 mmol/L (ref 101–111)
Creatinine, Ser: 1.14 mg/dL (ref 0.61–1.24)
GFR calc Af Amer: >60 mL/min (ref >60)
GFR calc non Af Amer: >60 mL/min (ref >60)
Glucose, Bld: 102 mg/dL — ABNORMAL HIGH (ref 65–99)
Potassium: 3.9 mmol/L (ref 3.5–5.1)
Sodium: 140 mmol/L (ref 135–145)

## 2017-01-06 MED ORDER — SULFAMETHOXAZOLE-TRIMETHOPRIM 800-160 MG PO TABS: 1.0000 | ORAL_TABLET | Freq: Two times a day (BID) | ORAL | 0 refills | Status: AC

## 2017-01-06 MED ORDER — LISINOPRIL 20 MG PO TABS: 20.0000 mg | ORAL_TABLET | Freq: Every day | ORAL | 0 refills | Status: DC

## 2017-01-06 MED ORDER — LABETALOL HCL 5 MG/ML IV SOLN
10.0000 mg | Freq: Once | INTRAVENOUS | Status: AC
  Administered 2017-01-06: 10 mg via INTRAVENOUS
  Filled 2017-01-06: qty 4

## 2017-01-06 NOTE — ED Provider Notes (Signed)
[facility] DEPT Provider Note   CSN: [id number] Arrival date & time: 01/06/17  1057     History   Chief Complaint Chief Complaint  Patient presents with  . Headache    HPI Curtis Saunders is a 61 y.o. male a history of hypertension who presents to the emergency department complaining of a headache that started this morning.  Patient describes the headache as being an achy pain to the frontal region.  States the pain came on gradually, progressively worsened and has been constant since.  Patient states that he tried [facility] powder without relief, no other OTC intervention.  No specific alleviating or aggravating factors.  Has had some associated nausea, this is resolved at present.  States that he has history of similar headaches which she has been evaluated in the emergency department for- most recently in July of 2018 .  Of note patient has not had his blood pressure medication, lisinopril 20 mg daily in over 1 month.  Reports he ran out of the medication.  Denies change in vision, numbness, weakness, fever, neck pain, chest pain, or dyspnea.  HPI  Past Medical History:  Diagnosis Date  . Hypertension     There are no active problems to display for this patient.   Past Surgical History:  Procedure Laterality Date  . APPENDECTOMY    . EYE SURGERY    . HAND SURGERY    . KNEE SURGERY     x3, x1 R  . TONSILLECTOMY         Home Medications    Prior to Admission medications   Medication Sig Start Date End Date Taking? Authorizing Provider  Aspirin-Salicylamide-Caffeine (BC HEADACHE POWDER PO) Take 1 Package by mouth 2 (two) times daily as needed (headache).    Yes [provider]  lisinopril (PRINIVIL,ZESTRIL) 20 MG tablet Take 1 tablet (20 mg total) by mouth daily. 01/06/17   Stormee Duda R, PA-C  sulfamethoxazole-trimethoprim (BACTRIM DS,SEPTRA DS) 800-160 MG tablet Take 1 tablet by mouth 2 (two) times daily for 7 days.  01/06/17 01/13/17  Phila Shoaf, Glynda Jaeger, PA-C    Family History History reviewed. No pertinent family history.  Social History Social History   Tobacco Use  . Smoking status: Never Smoker  . Smokeless tobacco: Never Used  Substance Use Topics  . Alcohol use: No    Comment: socially  . Drug use: No     Allergies   Bee venom   Review of Systems Review of Systems  Constitutional: Negative for chills and fever.  HENT: Negative for congestion, ear pain, rhinorrhea and sinus pain.   Eyes: Negative for visual disturbance.  Respiratory: Negative for shortness of breath.   Cardiovascular: Negative for chest pain and palpitations.  Gastrointestinal: Negative for abdominal pain, constipation, diarrhea, nausea and vomiting.  Genitourinary: Positive for frequency. Negative for discharge, dysuria, flank pain, penile pain, penile swelling, scrotal swelling and testicular pain.  Musculoskeletal: Negative for neck pain.  Neurological: Positive for headaches. Negative for dizziness, weakness, light-headedness and numbness.  All other systems reviewed and are negative.    Physical Exam Updated Vital Signs BP (!) 183/107   Pulse (!) 56   Temp 98 F (36.7 C) (Oral)   Resp 18   Wt 77.7 kg (171 lb 3 oz)   SpO2 100%   BMI 23.88 kg/m   Physical Exam  Constitutional: He appears well-developed and well-nourished.  Non-toxic appearance. No distress.  HENT:  Head: Normocephalic and atraumatic.  Right Ear:  Tympanic membrane normal. Tympanic membrane is not perforated and not erythematous.  Left Ear: Tympanic membrane normal. Tympanic membrane is not perforated and not erythematous.  Eyes: EOM are normal. Pupils are equal, round, and reactive to light. Right eye exhibits no discharge. Left eye exhibits no discharge. Right conjunctiva has no hemorrhage. Left conjunctiva has no hemorrhage.  Fundoscopic exam:      The right eye shows no hemorrhage and no papilledema.       The left eye  shows no hemorrhage and no papilledema.  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 Patient with hx of this, not new.   Neck: Normal range of motion. Neck supple.  Cardiovascular: Normal rate and regular rhythm. Exam reveals no gallop and no friction rub.  No murmur heard. Pulmonary/Chest: Breath sounds normal. No respiratory distress. He has no wheezes. He has no rales.  Abdominal: Soft. He exhibits no distension. There is no tenderness. There is no rigidity, no rebound and no guarding.  Neurological:  Alert. Clear speech. No facial droop. CNIII-XII are intact. Bilateral upper and lower extremities' sensation intact to sharp and dull touch. 5/5 grip strength bilaterally. 5/5 strength with plantar and dorsi flexion bilaterally. Normal finger to nose bilaterally. Negative pronator drift. Gait is normal.   Skin: Skin is warm and dry. No rash noted.  Psychiatric: He has a normal mood and affect. His behavior is normal.  Nursing note and vitals reviewed.   ED Treatments / Results  Labs Results for orders placed or performed during the hospital encounter of 38/75/64  Basic metabolic panel  Result Value Ref Range   Sodium 140 135 - 145 mmol/L   Potassium 3.9 3.5 - 5.1 mmol/L   Chloride 110 101 - 111 mmol/L   CO2 24 22 - 32 mmol/L   Glucose, Bld 102 (H) 65 - 99 mg/dL   BUN 16 6 - 20 mg/dL   Creatinine, Ser 1.14 0.61 - 1.24 mg/dL   Calcium 9.1 8.9 - 10.3 mg/dL   GFR calc non Af Amer >60 >60 mL/min   GFR calc Af Amer >60 >60 mL/min   Anion gap 6 5 - 15  CBC with Differential  Result Value Ref Range   WBC 11.0 (H) 4.0 - 10.5 K/uL   RBC 4.21 (L) 4.22 - 5.81 MIL/uL   Hemoglobin 12.5 (L) 13.0 - 17.0 g/dL   HCT 37.8 (L) 39.0 - 52.0 %   MCV 89.8 78.0 - 100.0 fL   MCH 29.7 26.0 - 34.0 pg   MCHC 33.1 30.0 - 36.0 g/dL   RDW 14.0 11.5 - 15.5 %   Platelets 198 150 - 400 K/uL   Neutrophils Relative % 82 %   Neutro Abs 9.1  (H) 1.7 - 7.7 K/uL   Lymphocytes Relative 13 %   Lymphs Abs 1.4 0.7 - 4.0 K/uL   Monocytes Relative 3 %   Monocytes Absolute 0.3 0.1 - 1.0 K/uL   Eosinophils Relative 2 %   Eosinophils Absolute 0.2 0.0 - 0.7 K/uL   Basophils Relative 0 %   Basophils Absolute 0.0 0.0 - 0.1 K/uL  Urinalysis, Routine w reflex microscopic  Result Value Ref Range   Color, Urine YELLOW YELLOW   APPearance CLEAR CLEAR   Specific Gravity, Urine 1.017 1.005 - 1.030   pH 6.0 5.0 - 8.0   Glucose, UA NEGATIVE NEGATIVE mg/dL   Hgb urine dipstick NEGATIVE NEGATIVE   Bilirubin Urine NEGATIVE NEGATIVE  Ketones, ur NEGATIVE NEGATIVE mg/dL   Protein, ur NEGATIVE NEGATIVE mg/dL   Nitrite NEGATIVE NEGATIVE   Leukocytes, UA SMALL (A) NEGATIVE   RBC / HPF 0-5 0 - 5 RBC/hpf   WBC, UA 0-5 0 - 5 WBC/hpf   Bacteria, UA MANY (A) NONE SEEN   Squamous Epithelial / LPF 0-5 (A) NONE SEEN    EKG  EKG Interpretation None       Radiology Ct Head Wo Contrast  Result Date: 01/06/2017 CLINICAL DATA:  Bi temporal headaches for several hours EXAM: CT HEAD WITHOUT CONTRAST TECHNIQUE: Contiguous axial images were obtained from the base of the skull through the vertex without intravenous contrast. COMPARISON:  None. FINDINGS: Brain: No evidence of acute infarction, hemorrhage, hydrocephalus, extra-axial collection or mass lesion/mass effect. Vascular: No hyperdense vessel or unexpected calcification. Skull: Normal. Negative for fracture or focal lesion. Sinuses/Orbits: No acute finding. Other: None. IMPRESSION: No acute intracranial abnormality noted. Electronically Signed   By: Inez Catalina M.D.   On: 01/06/2017 13:46    Procedures Procedures (including critical care time)  Medications Ordered in ED Medications  labetalol (NORMODYNE,TRANDATE) injection 10 mg (10 mg Intravenous Given 01/06/17 1249)     Initial Impression / Assessment and Plan / ED Course  I have reviewed the triage vital signs and the nursing  notes.  Pertinent labs & imaging results that were available during my care of the patient were reviewed by me and considered in my medical decision making (see chart for details).    Patient presents with complaint of headache. Patient is nontoxic appearing, in no apparent distress, blood pressure noted to be elevated. Patient has hx of similar headaches, gradual onset with steady progression in severity- states this headache was not maximal intensity at onset, no "thunderclap" sensation.  He has had headaches similar to this which he has been evaluated for in the emergency department.  Upon chart review-was seen 08/08/16 for headache and blood pressure elevated at that time due to noncompliance.  He was discharged home with instructions to take blood pressure medicines. At present patient has no focal neuro deficits, dizziness, or change in vision, he is afebrile without nuchal rigidity. Blood pressure appears to be consistently elevated during previous ED visits, additionally patient has not been taking his blood pressure medication in over 1 month. Will evaluate with head CT as well as screening labs given lack of BP medication compliance and will administer Labetalol.  Head CT negative for acute intracranial abnormality. Doubt SAH, ICH, ischemic CVA, acute glaucoma, giant cell arteritis, mass, or meningitis.   Screening labs consistent with patient's baseline, however UA notable for leukocytes and many bacteria- upon further inquiry patient reports increased urinary frequency.  Will treat for UTI with bactrim.   Re-eval: Patient reports his headache has substantially improved, believes it is almost resolved.  Continues to deny change in vision, numbness, weakness.  Blood pressure slightly improved, remains elevated.  Suspect this to be patient's baseline. I will refill his Lisinopril.   I discussed with the patient the importance of blood pressure medication compliance, as well as primary care  follow-up for reevaluation and possible adjustment of medications.  Patient reported he does have a primary care provider, however contact information for internal medicine clinic and community clinic was included in patient's discharge instructions if he has difficulty making an appointment with his primary care provider. I discussed results, treatment plan, need for PCP follow-up, and return precautions with the patient. Provided opportunity for questions, patient confirmed  understanding and is in agreement with plan for DC home with BP medication and UTI tx with Bactrim.  Findings and plan of care discussed with supervising physician Dr. Lita Mains who is in agreement with plan.   Vitals:   01/06/17 1427 01/06/17 1429  BP:  (!) 177/109  Pulse: 64   Resp:    Temp:    SpO2: 99%     Final Clinical Impressions(s) / ED Diagnoses   Final diagnoses:  Essential hypertension  Acute cystitis without hematuria  Acute nonintractable headache, unspecified headache type    ED Discharge Orders        Ordered    lisinopril (PRINIVIL,ZESTRIL) 20 MG tablet  Daily     01/06/17 1412    sulfamethoxazole-trimethoprim (BACTRIM DS,SEPTRA DS) 800-160 MG tablet  2 times daily     01/06/17 [address], PA-C 01/06/17 1514    Julianne Rice, MD 01/07/17 [phone]

## 2017-01-06 NOTE — ED Triage Notes (Addendum)
Pt reports bitemporal HA since this am accompanied by nausea. HA not going away with BC powder he took at home. No emesis or diarrhea. HA does not change in relation to sound, light, or orientation of head.  Pt also out of BP medications

## 2017-01-06 NOTE — Discharge Instructions (Signed)
You were seen in the emergency department for a headache.  CT scan of your head did not show any new abnormality.  Your lab work today did not show any electrolyte abnormalities or problems with your kidney function.  Your urine sample did reveal a urinary tract infection which is likely what is causing your increased frequency urinating.   Your blood pressure was elevated in the emergency department today.  This is likely because you have not been taking your blood pressure medicine.  It is important that you take this daily.  I Have refilled your blood pressure medication.  You will need to follow-up with a primary care provider, if you do not have one I have provided information about a local primary care group and our community clinic in your discharge instructions.  This is necessary to recheck your blood pressure and possibly adjust medications.  Through pedis returns, take Tylenol or ibuprofen.  I have prescribed you an antibiotic, Bactrim, for your urinary tract infection. Please take this as prescribed. Please take all of your antibiotics until finished. You may develop abdominal discomfort or diarrhea from the antibiotic.  You may help offset this with probiotics which you can buy at the store (ask your pharmacist if unable to find) or get probiotics in the form of eating yogurt. Do not eat or take the probiotics until 2 hours after your antibiotic. If you are unable to tolerate these side effects follow-up with your primary care provider or return to the emergency department.   If you begin to experience any blistering, rashes, swelling, or difficulty breathing seek medical care for evaluation of potentially more serious side effects.   Please be aware that this medication may interact with other medications you are taking, please be sure to discuss your medication list with your pharmacist.    Be sure to follow-up with your primary care provider as discussed above.  Return to the emergency  department for any new or worsening symptoms including but not limited to numbness, weakness, change in vision, chest pain, shortness of breath, persistent fever, pain in your mid back or worsening of current symptoms.

## 2017-09-22 ENCOUNTER — Other Ambulatory Visit: Payer: Self-pay

## 2017-09-22 ENCOUNTER — Encounter (HOSPITAL_COMMUNITY): Payer: Self-pay | Admitting: Emergency Medicine

## 2017-09-22 ENCOUNTER — Emergency Department (HOSPITAL_COMMUNITY)
Admission: EM | Admit: 2017-09-22 | Discharge: 2017-09-22 | Disposition: A | Discharge status: Home / Self Care | Payer: Medicaid Other | Attending: Emergency Medicine | Admitting: Emergency Medicine

## 2017-09-22 DIAGNOSIS — I10 Essential (primary) hypertension: Secondary | ICD-10-CM | POA: Insufficient documentation

## 2017-09-22 DIAGNOSIS — Z79899 Other long term (current) drug therapy: Secondary | ICD-10-CM | POA: Insufficient documentation

## 2017-09-22 MED ORDER — LISINOPRIL 20 MG PO TABS
20.0000 mg | ORAL_TABLET | Freq: Once | ORAL | Status: AC
  Administered 2017-09-22: 20 mg via ORAL
  Filled 2017-09-22: qty 1

## 2017-09-22 MED ORDER — LISINOPRIL 10 MG PO TABS: 10.0000 mg | ORAL_TABLET | Freq: Once | ORAL | Status: DC

## 2017-09-22 MED ORDER — LISINOPRIL 20 MG PO TABS: 20.0000 mg | ORAL_TABLET | Freq: Every day | ORAL | 3 refills | Status: DC

## 2017-09-22 NOTE — ED Triage Notes (Signed)
Pt reports having elevated blood pressure that has been on going for the last several months. Pt reports last time he took medication was 2 months ago. Pt reports headache with blurred vision that began today.

## 2017-09-22 NOTE — ED Provider Notes (Signed)
[facility] DEPT Provider Note   CSN: [id number] Arrival date & time: 09/22/17  2029     History   Chief Complaint Chief Complaint  Patient presents with  . Hypertension    HPI Curtis Saunders is a 62 y.o. male.  Patient presents to the emergency department for evaluation of elevated blood pressure.  He reports that he had a slight headache earlier today but that has resolved.  Currently he has no blurred vision, chest pain, shortness of breath.  Patient reports that he was on lisinopril in the past with good blood pressure control, has not taken his medications for approximately 2 months.     Past Medical History:  Diagnosis Date  . Hypertension     There are no active problems to display for this patient.   Past Surgical History:  Procedure Laterality Date  . APPENDECTOMY    . EYE SURGERY    . HAND SURGERY    . KNEE SURGERY     x3, x1 R  . TONSILLECTOMY          Home Medications    Prior to Admission medications   Medication Sig Start Date End Date Taking? Authorizing Provider  Aspirin-Salicylamide-Caffeine (BC HEADACHE POWDER PO) Take 1 Package by mouth 2 (two) times daily as needed (headache).     [provider]  lisinopril (PRINIVIL,ZESTRIL) 20 MG tablet Take 1 tablet (20 mg total) by mouth daily. 09/22/17   Orpah Greek, MD    Family History History reviewed. No pertinent family history.  Social History Social History   Tobacco Use  . Smoking status: Never Smoker  . Smokeless tobacco: Never Used  Substance Use Topics  . Alcohol use: No    Comment: socially  . Drug use: No     Allergies   Bee venom   Review of Systems Review of Systems  Neurological: Positive for headaches.  All other systems reviewed and are negative.    Physical Exam Updated Vital Signs BP (!) 229/119 (BP Location: Left Arm)   Pulse (!) 52   Temp 97.6 F (36.4 C) (Oral)   Resp 16   Ht 5\' 11"  (1.803 m)   Wt  98.7 kg   SpO2 99%   BMI 30.35 kg/m   Physical Exam  Constitutional: He is oriented to person, place, and time. He appears well-developed and well-nourished. No distress.  HENT:  Head: Normocephalic and atraumatic.  Right Ear: Hearing normal.  Left Ear: Hearing normal.  Nose: Nose normal.  Mouth/Throat: Oropharynx is clear and moist and mucous membranes are normal.  Eyes: Pupils are equal, round, and reactive to light. Conjunctivae and EOM are normal.  Neck: Normal range of motion. Neck supple.  Cardiovascular: Regular rhythm, S1 normal and S2 normal. Exam reveals no gallop and no friction rub.  No murmur heard. Pulmonary/Chest: Effort normal and breath sounds normal. No respiratory distress. He exhibits no tenderness.  Abdominal: Soft. Normal appearance and bowel sounds are normal. There is no hepatosplenomegaly. There is no tenderness. There is no rebound, no guarding, no tenderness at McBurney's point and negative Murphy's sign. No hernia.  Musculoskeletal: Normal range of motion.  Neurological: He is alert and oriented to person, place, and time. He has normal strength. No cranial nerve deficit or sensory deficit. Coordination normal. GCS eye subscore is 4. GCS verbal subscore is 5. GCS motor subscore is 6.  Skin: Skin is warm, dry and intact. No rash noted. No cyanosis.  Psychiatric: He  has a normal mood and affect. His speech is normal and behavior is normal. Thought content normal.  Nursing note and vitals reviewed.    ED Treatments / Results  Labs (all labs ordered are listed, but only abnormal results are displayed) Labs Reviewed - No data to display  EKG None  Radiology No results found.  Procedures Procedures (including critical care time)  Medications Ordered in ED Medications  lisinopril (PRINIVIL,ZESTRIL) tablet 20 mg (has no administration in time range)     Initial Impression / Assessment and Plan / ED Course  I have reviewed the triage vital signs and  the nursing notes.  Pertinent labs & imaging results that were available during my care of the patient were reviewed by me and considered in my medical decision making (see chart for details).     Patient is very hypertensive.  He does not have any focal neurologic deficits, however.  His neurologic exam is normal.  Remainder of exam was unremarkable.  I counseled patient that his blood pressure is significantly elevated, he requires reduction of his blood pressure here as well as work-up to check his kidney function.  Patient reports that he has been here for several hours already and cannot wait around any longer.  I did have a conversation with him about the risks of leaving including possible death.  He understands this, does not wish to stay any longer.  He therefore was given a dose of lisinopril, prescription and counseled that he needs to return if he has any headache, blurred vision, chest pain, palpitations, shortness of breath.  He needs to find a primary care doctor to follow-up with.  Final Clinical Impressions(s) / ED Diagnoses   Final diagnoses:  Essential hypertension    ED Discharge Orders         Ordered    lisinopril (PRINIVIL,ZESTRIL) 20 MG tablet  Daily     09/22/17 2313           Orpah Greek, MD 09/22/17 2313

## 2017-10-29 ENCOUNTER — Emergency Department (HOSPITAL_COMMUNITY)
Admission: EM | Admit: 2017-10-29 | Discharge: 2017-10-30 | Disposition: A | Discharge status: Home / Self Care | Payer: Medicaid Other | Attending: Emergency Medicine | Admitting: Emergency Medicine

## 2017-10-29 ENCOUNTER — Encounter (HOSPITAL_COMMUNITY): Payer: Self-pay | Admitting: *Deleted

## 2017-10-29 ENCOUNTER — Other Ambulatory Visit: Payer: Self-pay

## 2017-10-29 DIAGNOSIS — K4090 Unilateral inguinal hernia, without obstruction or gangrene, not specified as recurrent: Secondary | ICD-10-CM

## 2017-10-29 DIAGNOSIS — I10 Essential (primary) hypertension: Secondary | ICD-10-CM

## 2017-10-29 DIAGNOSIS — N5089 Other specified disorders of the male genital organs: Secondary | ICD-10-CM | POA: Diagnosis not present

## 2017-10-29 DIAGNOSIS — N289 Disorder of kidney and ureter, unspecified: Secondary | ICD-10-CM

## 2017-10-29 DIAGNOSIS — R1084 Generalized abdominal pain: Secondary | ICD-10-CM | POA: Diagnosis present

## 2017-10-29 DIAGNOSIS — Z79899 Other long term (current) drug therapy: Secondary | ICD-10-CM | POA: Diagnosis not present

## 2017-10-29 DIAGNOSIS — N433 Hydrocele, unspecified: Secondary | ICD-10-CM | POA: Diagnosis not present

## 2017-10-29 DIAGNOSIS — I861 Scrotal varices: Secondary | ICD-10-CM

## 2017-10-29 DIAGNOSIS — K529 Noninfective gastroenteritis and colitis, unspecified: Secondary | ICD-10-CM

## 2017-10-29 NOTE — ED Triage Notes (Signed)
Pt says that he had sudden onset of lower abdominal pain and right testicle swelling just after trying to have a bowel movement. Pt says he was able to urinate without any issues at that time. Pt is hypertensive in triage, says he has been compliant with his medications.

## 2017-10-30 ENCOUNTER — Encounter (HOSPITAL_COMMUNITY): Payer: Self-pay

## 2017-10-30 ENCOUNTER — Emergency Department (HOSPITAL_COMMUNITY): Payer: Medicaid Other

## 2017-10-30 LAB — COMPREHENSIVE METABOLIC PANEL
ALT: 15 U/L (ref 0–44)
AST: 18 U/L (ref 15–41)
Albumin: 4.1 g/dL (ref 3.5–5.0)
Alkaline Phosphatase: 58 U/L (ref 38–126)
Anion gap: 8 (ref 5–15)
BUN: 18 mg/dL (ref 6–20)
CO2: 26 mmol/L (ref 22–32)
Calcium: 9.3 mg/dL (ref 8.9–10.3)
Chloride: 110 mmol/L (ref 98–111)
Creatinine, Ser: 1.42 mg/dL — ABNORMAL HIGH (ref 0.61–1.24)
GFR calc Af Amer: >60 mL/min (ref >60)
GFR calc non Af Amer: 53 mL/min — ABNORMAL LOW (ref >60)
Glucose, Bld: 117 mg/dL — ABNORMAL HIGH (ref 70–99)
Potassium: 3.6 mmol/L (ref 3.5–5.1)
Sodium: 144 mmol/L (ref 135–145)
Total Bilirubin: 0.6 mg/dL (ref 0.3–1.2)
Total Protein: 7.1 g/dL (ref 6.5–8.1)

## 2017-10-30 LAB — URINALYSIS, ROUTINE W REFLEX MICROSCOPIC
Bilirubin Urine: NEGATIVE
Glucose, UA: NEGATIVE mg/dL
Hgb urine dipstick: NEGATIVE
Ketones, ur: NEGATIVE mg/dL
Nitrite: NEGATIVE
Protein, ur: NEGATIVE mg/dL
Specific Gravity, Urine: 1.033 — ABNORMAL HIGH (ref 1.005–1.030)
WBC, UA: >50 WBC/hpf — ABNORMAL HIGH (ref 0–5)
pH: 6 (ref 5.0–8.0)

## 2017-10-30 LAB — CBC WITH DIFFERENTIAL/PLATELET
Basophils Absolute: 0 10*3/uL (ref 0.0–0.1)
Basophils Relative: 0 %
Eosinophils Absolute: 0.5 10*3/uL (ref 0.0–0.5)
Eosinophils Relative: 4 %
HCT: 39.5 % (ref 39.0–52.0)
Hemoglobin: 13 g/dL (ref 13.0–17.0)
Lymphocytes Relative: 22 %
Lymphs Abs: 2.3 10*3/uL (ref 0.7–4.0)
MCH: 29.3 pg (ref 26.0–34.0)
MCHC: 32.9 g/dL (ref 30.0–36.0)
MCV: 89.2 fL (ref 80.0–100.0)
Monocytes Absolute: 0.5 10*3/uL (ref 0.1–1.0)
Monocytes Relative: 4 %
Neutro Abs: 7.1 10*3/uL (ref 1.7–7.7)
Neutrophils Relative %: 70 %
Platelets: 198 10*3/uL (ref 150–400)
RBC: 4.43 MIL/uL (ref 4.22–5.81)
RDW: 13.8 % (ref 11.5–15.5)
WBC: 10.3 10*3/uL (ref 4.0–10.5)

## 2017-10-30 LAB — LIPASE, BLOOD: Lipase: 37 U/L (ref 11–51)

## 2017-10-30 MED ORDER — METRONIDAZOLE 500 MG PO TABS: 500.0000 mg | ORAL_TABLET | Freq: Three times a day (TID) | ORAL | 0 refills | Status: DC

## 2017-10-30 MED ORDER — IOPAMIDOL (ISOVUE-300) INJECTION 61%
100.0000 mL | Freq: Once | INTRAVENOUS | Status: AC | PRN
  Administered 2017-10-30: 100 mL via INTRAVENOUS

## 2017-10-30 MED ORDER — CIPROFLOXACIN HCL 500 MG PO TABS: 500.0000 mg | ORAL_TABLET | Freq: Once | ORAL | Status: DC

## 2017-10-30 MED ORDER — METRONIDAZOLE 500 MG PO TABS: 500.0000 mg | ORAL_TABLET | Freq: Once | ORAL | Status: DC

## 2017-10-30 MED ORDER — SODIUM CHLORIDE 0.9 % IJ SOLN
INTRAMUSCULAR | Status: AC
  Filled 2017-10-30: qty 50

## 2017-10-30 MED ORDER — CIPROFLOXACIN HCL 500 MG PO TABS: 500.0000 mg | ORAL_TABLET | Freq: Two times a day (BID) | ORAL | 0 refills | Status: DC

## 2017-10-30 MED ORDER — IOPAMIDOL (ISOVUE-300) INJECTION 61%
INTRAVENOUS | Status: AC
  Filled 2017-10-30: qty 100

## 2017-10-30 MED ORDER — MORPHINE SULFATE (PF) 4 MG/ML IV SOLN
4.0000 mg | Freq: Once | INTRAVENOUS | Status: AC
  Administered 2017-10-30: 4 mg via INTRAVENOUS
  Filled 2017-10-30: qty 1

## 2017-10-30 MED ORDER — HYDROCODONE-ACETAMINOPHEN 5-325 MG PO TABS: 1.0000 | ORAL_TABLET | ORAL | 0 refills | Status: DC | PRN

## 2017-10-30 NOTE — ED Provider Notes (Signed)
[facility] DEPT Provider Note   CSN: [id number] Arrival date & time: 10/29/17  2337     History   Chief Complaint Chief Complaint  Patient presents with  . Abdominal Pain    HPI Curtis Saunders is a 62 y.o. male.  The history is provided by the patient.  Abdominal Pain    He has history of hypertension, and noted onset this evening of abdominal swelling and pain as well as scrotal swelling and pain.  Pain is on the right side.  He rates pain at 9/10.  He has a sense that he has to move his bowels, but he does not feel any better following a bowel movement.  There has been no nausea or vomiting.  There has been no fever or chills.  He has not had anything like this before.  Past Medical History:  Diagnosis Date  . Hypertension     There are no active problems to display for this patient.   Past Surgical History:  Procedure Laterality Date  . APPENDECTOMY    . EYE SURGERY    . HAND SURGERY    . KNEE SURGERY     x3, x1 R  . TONSILLECTOMY          Home Medications    Prior to Admission medications   Medication Sig Start Date End Date Taking? Authorizing Provider  Aspirin-Salicylamide-Caffeine (BC HEADACHE POWDER PO) Take 1 Package by mouth 2 (two) times daily as needed (headache).     [provider]  lisinopril (PRINIVIL,ZESTRIL) 20 MG tablet Take 1 tablet (20 mg total) by mouth daily. 09/22/17   Orpah Greek, MD    Family History No family history on file.  Social History Social History   Tobacco Use  . Smoking status: Never Smoker  . Smokeless tobacco: Never Used  Substance Use Topics  . Alcohol use: No    Comment: socially  . Drug use: No     Allergies   Bee venom   Review of Systems Review of Systems  Gastrointestinal: Positive for abdominal pain.  All other systems reviewed and are negative.    Physical Exam Updated Vital Signs BP (!) 220/118 (BP Location: Left Arm)   Pulse (!) 58    Temp 98 F (36.7 C) (Oral)   Resp 20   SpO2 100%   Physical Exam  Nursing note and vitals reviewed.  62 year old male, resting comfortably and in no acute distress. Vital signs are significant for elevated blood pressure and mild bradycardia. Oxygen saturation is 100%, which is normal. Head is normocephalic and atraumatic. PERRLA, EOMI. Oropharynx is clear. Neck is nontender and supple without adenopathy or JVD. Back is nontender and there is no CVA tenderness. Lungs are clear without rales, wheezes, or rhonchi. Chest is nontender. Heart has regular rate and rhythm without murmur. Abdomen is soft, flat, with mild tenderness in the right mid and lower abdomen.  There is no rebound or guarding.  There are no masses or hepatosplenomegaly and peristalsis is normoactive. Genitalia: Uncircumcised penis.  Marked induration, swelling, tenderness of the right testicle.  Left testicle is normal. Extremities have no cyanosis or edema, full range of motion is present. Skin is warm and dry without rash. Neurologic: Mental status is normal, cranial nerves are intact, there are no motor or sensory deficits.  ED Treatments / Results  Labs (all labs ordered are listed, but only abnormal results are displayed) Labs Reviewed  COMPREHENSIVE METABOLIC PANEL -  Abnormal; Notable for the following components:      Result Value   Glucose, Bld 117 (*)    Creatinine, Ser 1.42 (*)    GFR calc non Af Amer 53 (*)    All other components within normal limits  LIPASE, BLOOD  CBC WITH DIFFERENTIAL/PLATELET  URINALYSIS, ROUTINE W REFLEX MICROSCOPIC   Radiology Ct Abdomen Pelvis W Contrast  Result Date: 10/30/2017 CLINICAL DATA:  Lower abdominal and right testicular pain. EXAM: CT ABDOMEN AND PELVIS WITH CONTRAST TECHNIQUE: Multidetector CT imaging of the abdomen and pelvis was performed using the standard protocol following bolus administration of intravenous contrast. CONTRAST:  176mL ISOVUE-300 IOPAMIDOL  (ISOVUE-300) INJECTION 61% COMPARISON:  03/17/2014 CT abdomen pelvis. Scrotal ultrasound 10/30/2017 FINDINGS: LOWER CHEST: Trace right pleural effusion. Bibasilar atelectasis. Mild cardiomegaly. HEPATOBILIARY: Small volume perihepatic ascites. Liver parenchyma is normal. The gallbladder is normal. PANCREAS: The pancreatic parenchymal contours are normal and there is no ductal dilatation. There is no peripancreatic fluid collection. SPLEEN: Normal. ADRENALS/URINARY TRACT: --Adrenal glands: Normal. --Right kidney/ureter: No hydronephrosis, nephroureterolithiasis, perinephric stranding or solid renal mass. 5.2 cm right renal cyst. --Left kidney/ureter: No hydronephrosis, nephroureterolithiasis, perinephric stranding or solid renal mass. --Urinary bladder: Normal for degree of distention STOMACH/BOWEL: --Stomach/Duodenum: There is no hiatal hernia or other gastric abnormality. The duodenal course and caliber are normal. --Small bowel: There is wall thickening with venous congestion and mucosal hyperemia of a long segment of upper abdominal small bowel. There is no dilatation or obstruction. --Colon: No focal abnormality. --Appendix: Surgically absent. There is fluid within the right lower quadrant tracking into the pelvis. VASCULAR/LYMPHATIC: Normal course and caliber of the major abdominal vessels. No abdominal or pelvic lymphadenopathy. REPRODUCTIVE: The prostate is upper limits of normal for size. There is an incompletely visualized large right hydrocele. There is a fat-containing inguinal hernia that contains a small amount of fluid. MUSCULOSKELETAL. No bony spinal canal stenosis or focal osseous abnormality. OTHER: None. IMPRESSION: 1. Long segment of small bowel wall thickening, mucosal hyperemia and venous congestion is consistent with acute enteritis. 2. Right lower quadrant free fluid extends through right inguinal hernia into the right hemiscrotum. 3. Trace right pleural effusion and small volume perihepatic  ascites. Electronically Signed   By: Ulyses Jarred M.D.   On: 10/30/2017 04:00   US Scrotum W/doppler  Result Date: 10/30/2017 CLINICAL DATA:  Right scrotal swelling, acute onset this evening. EXAM: SCROTAL ULTRASOUND DOPPLER ULTRASOUND OF THE TESTICLES TECHNIQUE: Complete ultrasound examination of the testicles, epididymis, and other scrotal structures was performed. Color and spectral Doppler ultrasound were also utilized to evaluate blood flow to the testicles. COMPARISON:  None. FINDINGS: Right testicle Measurements: 4 x 2.4 x 2.3 cm. No mass or microlithiasis visualized. Left testicle Measurements: 4 x 1.4 x 2.5 cm. No mass or microlithiasis visualized. Right epididymis:  2.8 mm epididymal cyst or spermatocele. Left epididymis:  Normal in size and appearance. Hydrocele:  Large right hydrocele is present. Varicocele:  Small left varicocele is present. Pulsed Doppler interrogation of both testes demonstrates normal low resistance arterial and venous waveforms bilaterally. IMPRESSION: 1. Large right hydrocele. 2. Small left varicocele. 3. Normal appearance of the testicles. No evidence of testicular mass, inflammation, or torsion. Electronically Signed   By: Lucienne Capers M.D.   On: 10/30/2017 01:52    Procedures Procedures   Medications Ordered in ED Medications  iopamidol (ISOVUE-300) 61 % injection (has no administration in time range)  sodium chloride 0.9 % injection (has no administration in time range)  ciprofloxacin (CIPRO)  tablet 500 mg (has no administration in time range)  metroNIDAZOLE (FLAGYL) tablet 500 mg (has no administration in time range)  morphine 4 MG/ML injection 4 mg (4 mg Intravenous Given 10/30/17 0016)  iopamidol (ISOVUE-300) 61 % injection 100 mL (100 mLs Intravenous Contrast Given 10/30/17 0332)  morphine 4 MG/ML injection 4 mg (4 mg Intravenous Given 10/30/17 0350)     Initial Impression / Assessment and Plan / ED Course  I have reviewed the triage vital signs and  the nursing notes.  Pertinent labs & imaging results that were available during my care of the patient were reviewed by me and considered in my medical decision making (see chart for details).  Right testicular pain and swelling most consistent with epididymitis.  Will send for scrotal ultrasound.  Old records are reviewed, and he has a prior ED visit for epididymitis in 2017.  Also, numerous ED visits for hypertension.  Elevated blood pressure today is at least partially related to pain.  He will be given morphine for pain and will continue to monitor his blood pressure.  Blood pressure has come down with pain control.  Scrotal ultrasound showed large hydrocele on the right, small varicocele on the left.  No evidence of epididymitis.  He was sent for a CT of abdomen and pelvis which showed a long segment of bowel with enteritis and free fluid extending through a right inguinal hernia into the inguinal canal.  That probably is what is accounting for his pain and tenderness.  Labs are reassuring including normal WBC.  However, there is slight elevation of his creatinine which will need to be followed as an outpatient.  This is explained to patient.  He is given a dose of ciprofloxacin and metronidazole in the ED and sent home with prescriptions for these.  Also given prescription for small number of hydrocodone-acetaminophen tablets.  Encouraged outpatient follow-up of his blood pressure.  He is referred to urology for an opinion regarding his hydrocele, and referred to general surgery for an opinion regarding his inguinal hernia.  I have advised him that at this point, I do not feel he needs surgery on either.  Return precautions discussed.  Final Clinical Impressions(s) / ED Diagnoses   Final diagnoses:  Scrotal swelling  Enteritis  Right hydrocele  Right inguinal hernia  Left varicocele  Elevated blood pressure reading with diagnosis of hypertension    ED Discharge Orders         Ordered     ciprofloxacin (CIPRO) 500 MG tablet  2 times daily,   Status:  Discontinued     10/30/17 0512    metroNIDAZOLE (FLAGYL) 500 MG tablet  3 times daily     10/30/17 0512    HYDROcodone-acetaminophen (NORCO) 5-325 MG tablet  Every 4 hours PRN     10/30/17 0512    ciprofloxacin (CIPRO) 500 MG tablet  2 times daily     10/30/17 6256           Delora Fuel, MD 38/93/73 [phone]

## 2017-10-30 NOTE — ED Notes (Signed)
Pt attempted to sign states hes cant right now

## 2017-10-30 NOTE — ED Notes (Signed)
Patient is aware a urine sample is needed and stated he could provide a specimen at this time. Will recheck with patient within the hour about a urine sample.

## 2017-10-30 NOTE — Discharge Instructions (Addendum)
Your evaluation showed some inflammation in your colon, which is what is causing your pain.  There is a small hernia on the right and a large hydrocele in the right.  Hydrocele is a fluid-filled sac that is in the scrotum.  Ultrasound also showed a small varicocele on the left.  Varicocele is a dilated vein.  Most often, hydroceles and varicoceles do not need any specific treatment.  You have been given a prescription for antibiotics.  Please take them as directed.  Please be aware that you may not drink anything with alcohol and it while you are taking the antibiotics.  If you do, you will get extremely sick.  Please follow-up with the general surgeon to discuss the hernia, and please follow-up with the urologist to discuss the hydrocele and varicocele.  Return if symptoms are getting worse.

## 2017-10-30 NOTE — ED Notes (Signed)
Patient reminded a urine sample is needed and has a urinal at bedside.

## 2017-10-30 NOTE — ED Notes (Signed)
Patient reminded a urine sample is needed.

## 2017-10-30 NOTE — ED Notes (Signed)
Pt continues to say he is unable to give a urine sample at this time

## 2017-10-30 NOTE — ED Notes (Signed)
Pt states he does not want to take his first dose of antibiotics here because he is going to go pick it up this morning and has not had anything on his stomach. Refused any food/drink offered

## 2017-12-18 ENCOUNTER — Emergency Department (HOSPITAL_COMMUNITY)
Admission: EM | Admit: 2017-12-18 | Discharge: 2017-12-18 | Disposition: A | Discharge status: Home / Self Care | Payer: Medicaid Other | Attending: Emergency Medicine | Admitting: Emergency Medicine

## 2017-12-18 ENCOUNTER — Encounter (HOSPITAL_COMMUNITY): Payer: Self-pay

## 2017-12-18 DIAGNOSIS — Z202 Contact with and (suspected) exposure to infections with a predominantly sexual mode of transmission: Secondary | ICD-10-CM

## 2017-12-18 DIAGNOSIS — I10 Essential (primary) hypertension: Secondary | ICD-10-CM | POA: Insufficient documentation

## 2017-12-18 MED ORDER — AZITHROMYCIN 250 MG PO TABS
1000.0000 mg | ORAL_TABLET | Freq: Once | ORAL | Status: AC
  Administered 2017-12-18: 1000 mg via ORAL
  Filled 2017-12-18: qty 4

## 2017-12-18 MED ORDER — CEFTRIAXONE SODIUM 250 MG IJ SOLR
250.0000 mg | Freq: Once | INTRAMUSCULAR | Status: AC
  Administered 2017-12-18: 250 mg via INTRAMUSCULAR
  Filled 2017-12-18: qty 250

## 2017-12-18 MED ORDER — LIDOCAINE HCL 1 % IJ SOLN
INTRAMUSCULAR | Status: AC
  Administered 2017-12-18: 0.9 mL
  Filled 2017-12-18: qty 20

## 2017-12-18 NOTE — ED Provider Notes (Signed)
[facility] DEPT Provider Note   CSN: [id number] Arrival date & time: 12/18/17  6387     History   Chief Complaint Chief Complaint  Patient presents with  . Exposure to STD    HPI Curtis Saunders is a 62 y.o. male.  Patient is a 62 year old male with past medical history of hypertension.  He presents today for evaluation of exposure to STD.  He reports his girlfriend was diagnosed and treated yesterday for gonorrhea.  He denies to me he is having any symptoms.  He denies any fevers, chills, urethral discharge, or dysuria.  He is requesting treatment.  The history is provided by the patient.  Exposure to STD  This is a new problem. The problem has not changed since onset.Pertinent negatives include no abdominal pain. Nothing aggravates the symptoms. Nothing relieves the symptoms. He has tried nothing for the symptoms.    Past Medical History:  Diagnosis Date  . Hypertension     There are no active problems to display for this patient.   Past Surgical History:  Procedure Laterality Date  . APPENDECTOMY    . EYE SURGERY    . HAND SURGERY    . KNEE SURGERY     x3, x1 R  . TONSILLECTOMY          Home Medications    Prior to Admission medications   Medication Sig Start Date End Date Taking? Authorizing Provider  ciprofloxacin (CIPRO) 500 MG tablet Take 1 tablet (500 mg total) by mouth 2 (two) times daily. 56/4/33   Delora Fuel, MD  HYDROcodone-acetaminophen (NORCO) 5-325 MG tablet Take 1 tablet by mouth every 4 (four) hours as needed for moderate pain. 29/5/18   Delora Fuel, MD  lisinopril (PRINIVIL,ZESTRIL) 20 MG tablet Take 1 tablet (20 mg total) by mouth daily. 09/22/17   Orpah Greek, MD  metroNIDAZOLE (FLAGYL) 500 MG tablet Take 1 tablet (500 mg total) by mouth 3 (three) times daily. 84/1/66   Delora Fuel, MD    Family History History reviewed. No pertinent family history.  Social History Social History    Tobacco Use  . Smoking status: Never Smoker  . Smokeless tobacco: Never Used  Substance Use Topics  . Alcohol use: No    Comment: socially  . Drug use: No     Allergies   Bee venom   Review of Systems Review of Systems  Gastrointestinal: Negative for abdominal pain.  All other systems reviewed and are negative.    Physical Exam Updated Vital Signs BP (!) 242/141 (BP Location: Left Arm)   Pulse 68   Temp 98.4 F (36.9 C) (Oral)   Resp 18   Ht 6' (1.829 m)   Wt 98.9 kg   SpO2 99%   BMI 29.57 kg/m   Physical Exam  Constitutional: He is oriented to person, place, and time. He appears well-developed and well-nourished. No distress.  HENT:  Head: Normocephalic and atraumatic.  Neck: Normal range of motion. Neck supple.  Cardiovascular: Normal rate and regular rhythm. Exam reveals no friction rub.  No murmur heard. Pulmonary/Chest: Effort normal and breath sounds normal. No respiratory distress. He has no wheezes. He has no rales.  Abdominal: Soft. Bowel sounds are normal. He exhibits no distension. There is no tenderness.  Musculoskeletal: Normal range of motion. He exhibits no edema.  Neurological: He is alert and oriented to person, place, and time. Coordination normal.  Skin: Skin is warm and dry. He is not diaphoretic.  Nursing note and vitals reviewed.    ED Treatments / Results  Labs (all labs ordered are listed, but only abnormal results are displayed) Labs Reviewed - No data to display  EKG None  Radiology No results found.  Procedures Procedures (including critical care time)  Medications Ordered in ED Medications  cefTRIAXone (ROCEPHIN) injection 250 mg (has no administration in time range)  azithromycin (ZITHROMAX) tablet 1,000 mg (has no administration in time range)     Initial Impression / Assessment and Plan / ED Course  I have reviewed the triage vital signs and the nursing notes.  Pertinent labs & imaging results that were  available during my care of the patient were reviewed by me and considered in my medical decision making (see chart for details).  Patient presenting after an exposure to gonorrhea.  He states his girlfriend was diagnosed yesterday.  He was given Rocephin and Zithromax and will be discharged.  Final Clinical Impressions(s) / ED Diagnoses   Final diagnoses:  None    ED Discharge Orders    None       Veryl Speak, MD 12/18/17 1513

## 2017-12-18 NOTE — ED Triage Notes (Signed)
Pt presents with c/o exposure to STD. Pt reports his girlfriend was diagnosed with gonorrhea yesterday. Pt denies any discharge or burning with urination but does report some discomfort in his genitals.

## 2017-12-18 NOTE — Discharge Instructions (Addendum)
No unprotected sex for 2 weeks.  Follow-up with your primary doctor if you experience any additional problems.

## 2018-02-20 ENCOUNTER — Other Ambulatory Visit: Payer: Self-pay

## 2018-02-20 ENCOUNTER — Observation Stay (HOSPITAL_BASED_OUTPATIENT_CLINIC_OR_DEPARTMENT_OTHER): Payer: Medicaid Other

## 2018-02-20 ENCOUNTER — Encounter (HOSPITAL_COMMUNITY): Payer: Self-pay | Admitting: Emergency Medicine

## 2018-02-20 ENCOUNTER — Emergency Department (HOSPITAL_COMMUNITY): Payer: Medicaid Other

## 2018-02-20 ENCOUNTER — Observation Stay (HOSPITAL_COMMUNITY): Payer: Medicaid Other

## 2018-02-20 ENCOUNTER — Observation Stay (HOSPITAL_COMMUNITY)
Admission: EM | Admit: 2018-02-20 | Discharge: 2018-02-21 | Disposition: A | Discharge status: Home / Self Care | Payer: Medicaid Other | Attending: Family Medicine | Admitting: Family Medicine

## 2018-02-20 DIAGNOSIS — I10 Essential (primary) hypertension: Secondary | ICD-10-CM | POA: Diagnosis not present

## 2018-02-20 DIAGNOSIS — N281 Cyst of kidney, acquired: Secondary | ICD-10-CM | POA: Diagnosis not present

## 2018-02-20 DIAGNOSIS — R079 Chest pain, unspecified: Secondary | ICD-10-CM | POA: Diagnosis not present

## 2018-02-20 DIAGNOSIS — R0789 Other chest pain: Secondary | ICD-10-CM | POA: Insufficient documentation

## 2018-02-20 DIAGNOSIS — I6523 Occlusion and stenosis of bilateral carotid arteries: Secondary | ICD-10-CM | POA: Diagnosis not present

## 2018-02-20 DIAGNOSIS — Z9114 Patient's other noncompliance with medication regimen: Secondary | ICD-10-CM | POA: Diagnosis not present

## 2018-02-20 LAB — BASIC METABOLIC PANEL
Anion gap: 7 (ref 5–15)
BUN: 17 mg/dL (ref 6–20)
CO2: 26 mmol/L (ref 22–32)
Calcium: 9.6 mg/dL (ref 8.9–10.3)
Chloride: 107 mmol/L (ref 98–111)
Creatinine, Ser: 1.21 mg/dL (ref 0.61–1.24)
GFR calc Af Amer: >60 mL/min (ref >60)
GFR calc non Af Amer: >60 mL/min (ref >60)
Glucose, Bld: 106 mg/dL — ABNORMAL HIGH (ref 70–99)
Potassium: 3.7 mmol/L (ref 3.5–5.1)
Sodium: 140 mmol/L (ref 135–145)

## 2018-02-20 LAB — LIPID PANEL
Cholesterol: 165 mg/dL (ref 0–200)
HDL: 56 mg/dL (ref >40)
LDL Cholesterol: 100 mg/dL — ABNORMAL HIGH (ref 0–99)
Total CHOL/HDL Ratio: 2.9 RATIO
Triglycerides: 47 mg/dL (ref <150)
VLDL: 9 mg/dL (ref 0–40)

## 2018-02-20 LAB — CBC
HCT: 43.9 % (ref 39.0–52.0)
Hemoglobin: 13.9 g/dL (ref 13.0–17.0)
MCH: 28.6 pg (ref 26.0–34.0)
MCHC: 31.7 g/dL (ref 30.0–36.0)
MCV: 90.3 fL (ref 80.0–100.0)
Platelets: 249 10*3/uL (ref 150–400)
RBC: 4.86 MIL/uL (ref 4.22–5.81)
RDW: 13.6 % (ref 11.5–15.5)
WBC: 8.4 10*3/uL (ref 4.0–10.5)
nRBC: 0 % (ref 0.0–0.2)

## 2018-02-20 LAB — RAPID URINE DRUG SCREEN, HOSP PERFORMED
Amphetamines: NOT DETECTED
Barbiturates: NOT DETECTED
Benzodiazepines: NOT DETECTED
Cocaine: NOT DETECTED
Opiates: NOT DETECTED
Tetrahydrocannabinol: NOT DETECTED

## 2018-02-20 LAB — POCT I-STAT TROPONIN I
Troponin i, poc: 0.02 ng/mL (ref 0.00–0.08)
Troponin i, poc: 0.01 ng/mL (ref 0.00–0.08)

## 2018-02-20 LAB — ECHOCARDIOGRAM COMPLETE
Height: 71 in
Weight: 3344 oz

## 2018-02-20 LAB — TROPONIN I: Troponin I: <0.03 ng/mL (ref <0.03)

## 2018-02-20 MED ORDER — AMLODIPINE BESYLATE 5 MG PO TABS: 5.0000 mg | ORAL_TABLET | Freq: Every day | ORAL | Status: DC

## 2018-02-20 MED ORDER — NITROGLYCERIN 0.4 MG SL SUBL
0.4000 mg | SUBLINGUAL_TABLET | SUBLINGUAL | Status: DC | PRN
  Administered 2018-02-20: 0.4 mg via SUBLINGUAL
  Filled 2018-02-20: qty 1

## 2018-02-20 MED ORDER — HYDRALAZINE HCL 20 MG/ML IJ SOLN
10.0000 mg | Freq: Once | INTRAMUSCULAR | Status: AC
  Administered 2018-02-20: 10 mg via INTRAVENOUS
  Filled 2018-02-20: qty 1

## 2018-02-20 MED ORDER — SODIUM CHLORIDE (PF) 0.9 % IJ SOLN
INTRAMUSCULAR | Status: AC
  Filled 2018-02-20: qty 50

## 2018-02-20 MED ORDER — HYDROMORPHONE HCL 1 MG/ML IJ SOLN
0.5000 mg | Freq: Once | INTRAMUSCULAR | Status: AC
  Administered 2018-02-20: 0.5 mg via INTRAVENOUS
  Filled 2018-02-20: qty 1

## 2018-02-20 MED ORDER — LIDOCAINE VISCOUS HCL 2 % MT SOLN
15.0000 mL | Freq: Once | OROMUCOSAL | Status: AC
  Administered 2018-02-20: 15 mL via ORAL
  Filled 2018-02-20: qty 15

## 2018-02-20 MED ORDER — TRAMADOL HCL 50 MG PO TABS: 50.0000 mg | ORAL_TABLET | Freq: Four times a day (QID) | ORAL | Status: DC | PRN

## 2018-02-20 MED ORDER — ONDANSETRON HCL 4 MG PO TABS: 4.0000 mg | ORAL_TABLET | Freq: Four times a day (QID) | ORAL | Status: DC | PRN

## 2018-02-20 MED ORDER — ALUM & MAG HYDROXIDE-SIMETH 200-200-20 MG/5ML PO SUSP
30.0000 mL | Freq: Once | ORAL | Status: AC
  Administered 2018-02-20: 30 mL via ORAL
  Filled 2018-02-20: qty 30

## 2018-02-20 MED ORDER — KETOROLAC TROMETHAMINE 15 MG/ML IJ SOLN: 15.0000 mg | Freq: Four times a day (QID) | INTRAMUSCULAR | Status: DC | PRN

## 2018-02-20 MED ORDER — LISINOPRIL 20 MG PO TABS: 20.0000 mg | ORAL_TABLET | Freq: Every day | ORAL | Status: DC

## 2018-02-20 MED ORDER — ACETAMINOPHEN 650 MG RE SUPP: 650.0000 mg | Freq: Four times a day (QID) | RECTAL | Status: DC | PRN

## 2018-02-20 MED ORDER — ONDANSETRON HCL 4 MG/2ML IJ SOLN: 4.0000 mg | Freq: Four times a day (QID) | INTRAMUSCULAR | Status: DC | PRN

## 2018-02-20 MED ORDER — ACETAMINOPHEN 325 MG PO TABS: 650.0000 mg | ORAL_TABLET | Freq: Four times a day (QID) | ORAL | Status: DC | PRN

## 2018-02-20 MED ORDER — HYDRALAZINE HCL 20 MG/ML IJ SOLN: 10.0000 mg | Freq: Four times a day (QID) | INTRAMUSCULAR | Status: DC | PRN

## 2018-02-20 MED ORDER — LISINOPRIL 20 MG PO TABS
20.0000 mg | ORAL_TABLET | Freq: Once | ORAL | Status: AC
  Administered 2018-02-20: 20 mg via ORAL
  Filled 2018-02-20: qty 1

## 2018-02-20 MED ORDER — IOPAMIDOL (ISOVUE-370) INJECTION 76%
INTRAVENOUS | Status: AC
  Filled 2018-02-20: qty 100

## 2018-02-20 MED ORDER — KETOROLAC TROMETHAMINE 15 MG/ML IJ SOLN
15.0000 mg | Freq: Once | INTRAMUSCULAR | Status: AC
  Administered 2018-02-20: 15 mg via INTRAVENOUS
  Filled 2018-02-20: qty 1

## 2018-02-20 MED ORDER — IOPAMIDOL (ISOVUE-370) INJECTION 76%
100.0000 mL | Freq: Once | INTRAVENOUS | Status: AC | PRN
  Administered 2018-02-20: 100 mL via INTRAVENOUS

## 2018-02-20 MED ORDER — ASPIRIN 81 MG PO CHEW
324.0000 mg | CHEWABLE_TABLET | Freq: Once | ORAL | Status: AC
  Administered 2018-02-20: 324 mg via ORAL
  Filled 2018-02-20: qty 4

## 2018-02-20 NOTE — ED Triage Notes (Signed)
Pt reports hasn't taken his HTN meds in about a month.

## 2018-02-20 NOTE — ED Notes (Signed)
ECHO at bedside with patient

## 2018-02-20 NOTE — ED Notes (Signed)
RN went into room to obtain another set of vital signs. Pt stated "Why am I here? I want to go home. I could be in my own bed rather than this little barn you have me in" Wife at bedside asking for updates because she would like to go home. Pt informed that due to high volume of pts, we will be holding him down in the ED until a bed opens up. Pt upset that he has to stay.

## 2018-02-20 NOTE — ED Triage Notes (Signed)
Pt c/o central to left chest pains that started an hour ago.

## 2018-02-20 NOTE — ED Provider Notes (Signed)
[facility] Emergency Department Provider Note MRN:  [id number]  Arrival date & time: 02/20/18     Chief Complaint   Chest Pain   History of Present Illness   Curtis Saunders is a 63 y.o. year-old male with a history of hypertension presenting to the ED with chief complaint of chest pain.  Pain is located in the central and left-side of the chest, nonradiating.  Described as sharp, stabbing, 12 out of 10 in severity, constant for the past 1 hour.  Was initially associated with shortness of breath, which has improved.  Denies dizziness, diaphoresis, nausea, vomiting.  No abdominal pain, no fever, no cough, no numbness or weakness to the arms or legs.  Patient has not been taking his blood pressure medications for the past 2 weeks.  Review of Systems  A complete 10 system review of systems was obtained and all systems are negative except as noted in the HPI and PMH.   Patient's Health History    Past Medical History:  Diagnosis Date  . Hypertension     Past Surgical History:  Procedure Laterality Date  . APPENDECTOMY    . EYE SURGERY    . HAND SURGERY    . KNEE SURGERY     x3, x1 R  . TONSILLECTOMY      No family history on file.  Social History   Socioeconomic History  . Marital status: Single    Spouse name: Not on file  . Number of children: Not on file  . Years of education: Not on file  . Highest education level: Not on file  Occupational History  . Not on file  Social Needs  . Financial resource strain: Not on file  . Food insecurity:    Worry: Not on file    Inability: Not on file  . Transportation needs:    Medical: Not on file    Non-medical: Not on file  Tobacco Use  . Smoking status: Never Smoker  . Smokeless tobacco: Never Used  Substance and Sexual Activity  . Alcohol use: No    Comment: socially  . Drug use: No  . Sexual activity: Yes  Lifestyle  . Physical activity:    Days per week: Not on file    Minutes per session:  Not on file  . Stress: Not on file  Relationships  . Social connections:    Talks on phone: Not on file    Gets together: Not on file    Attends religious service: Not on file    Active member of club or organization: Not on file    Attends meetings of clubs or organizations: Not on file    Relationship status: Not on file  . Intimate partner violence:    Fear of current or ex partner: Not on file    Emotionally abused: Not on file    Physically abused: Not on file    Forced sexual activity: Not on file  Other Topics Concern  . Not on file  Social History Narrative  . Not on file     Physical Exam  Vital Signs and Nursing Notes reviewed Vitals:   02/20/18 1500 02/20/18 1530  BP: (!) 180/105 (!) 179/110  Pulse: 68   Resp: 14 13  Temp:    SpO2: 96%     CONSTITUTIONAL: Well-appearing, NAD NEURO:  Alert and oriented x 3, no focal deficits EYES:  eyes equal and reactive ENT/NECK:  no LAD, no JVD CARDIO: Regular  rate, well-perfused, normal S1 and S2 PULM:  CTAB no wheezing or rhonchi GI/GU:  normal bowel sounds, non-distended, non-tender MSK/SPINE:  No gross deformities, no edema SKIN:  no rash, atraumatic PSYCH:  Appropriate speech and behavior  Diagnostic and Interventional Summary    EKG Interpretation  Date/Time:  Wednesday February 20 2018 07:55:51 EST Ventricular Rate:  95 PR Interval:    QRS Duration: 105 QT Interval:  359 QTC Calculation: 452 R Axis:   35 Text Interpretation:  Sinus rhythm Probable left atrial enlargement RSR' in V1 or V2, probably normal variant Left ventricular hypertrophy ST elevation suggests acute pericarditis Baseline wander in lead(s) V1 Confirmed by Curtis Saunders [phone]) on 02/20/2018 9:28:17 AM      Labs Reviewed  BASIC METABOLIC PANEL - Abnormal; Notable for the following components:      Result Value   Glucose, Bld 106 (*)    All other components within normal limits  CBC  I-STAT TROPONIN, ED  POCT I-STAT TROPONIN I  I-STAT  TROPONIN, ED  POCT I-STAT TROPONIN I    DG Chest 2 View  Final Result      Medications  aspirin chewable tablet 324 mg (324 mg Oral Given 02/20/18 0951)  HYDROmorphone (DILAUDID) injection 0.5 mg (0.5 mg Intravenous Given 02/20/18 0955)  alum & mag hydroxide-simeth (MAALOX/MYLANTA) 200-200-20 MG/5ML suspension 30 mL (30 mLs Oral Given 02/20/18 0953)    And  lidocaine (XYLOCAINE) 2 % viscous mouth solution 15 mL (15 mLs Oral Given 02/20/18 0953)  hydrALAZINE (APRESOLINE) injection 10 mg (10 mg Intravenous Given 02/20/18 0957)  lisinopril (PRINIVIL,ZESTRIL) tablet 20 mg (20 mg Oral Given 02/20/18 0953)  ketorolac (TORADOL) 15 MG/ML injection 15 mg (15 mg Intravenous Given 02/20/18 1343)     Procedures Critical Care Critical Care Documentation Critical care time provided by me (excluding procedures): 35 minutes  Condition necessitating critical care: Hypertensive urgency  Components of critical care management: reviewing of prior records, laboratory and imaging interpretation, frequent re-examination and reassessment of vital signs, administration of IV hydralazine, discussion with consulting services.    ED Course and Medical Decision Making  I have reviewed the triage vital signs and the nursing notes.  Pertinent labs & imaging results that were available during my care of the patient were reviewed by me and considered in my medical decision making (see below for details).  Atypical sharp chest pain in this 63 year old male, worse with laying flat, mildly improved with sitting forward.  Blood pressure 950 systolic, also considering ACS or demand ischemia in the setting of hypertensive emergency.  Treating blood pressure with IV hydralazine, troponin pending.  EKG with nonspecific findings.  Troponin negative x2.  Blood pressure was improved for a few hours, starting to increase again.  At the request of hospitalist service, discussed with cardiology who does agree with admission for blood  pressure control and ischemic evaluation.  Admitted to hospital service for further care.  [name]. Sedonia Small, [place] [email or web address]  Final Clinical Impressions(s) / ED Diagnoses     ICD-10-CM   1. Chest pain, unspecified type R07.9     ED Discharge Orders    None         Maudie Flakes, MD 02/20/18 1556

## 2018-02-20 NOTE — H&P (Signed)
History and Physical  Curtis Saunders BPZ:[id number] DOB: [birth year] DOA: 02/20/2018  PCP: Patient, [name] Patient coming from: Home   I have personally briefly reviewed patient's old medical records in [place]   Chief Complaint: Chest pain, shortness of breath.  HPI: Curtis Saunders is a 63 y.o. male past medical history significant for hypertension, noncompliant with medication for the last several weeks, who presents complaining of shortness of breath and chest pain.  He relate that he started to have severe chest pain, tightness in quality.  Pain was so severe that he he even cry.  He reported pain 10 out of 10.  Currently his pain is 8 out of 10.  He also reports shortness of breath with chest pain.  Pain gets worse when he lies down.  Per significant other patient was holding his chest this morning because of the severe chest pain.  He denies recreational drugs.  Significant other report of chest pain that started 45 minutes after they were having sex.   Significant other  also reports that she had noticed that his been more sleepy more weak over the last day.  Evaluation in the emergency department patient was found to have systolic blood pressure 242/353.  Troponin 0 0.02, hemoglobin 13 white blood cell 8.4 creatinine 1.2 chest x-ray; no active cardiopulmonary disease. Patient has received IV hydralazine, lisinopril, the Dilaudid.  Aspirin. Patient continued to have chest pain, tightness.  I have order order nitroglycerin.  Review of Systems: All systems reviewed and apart from history of presenting illness, are negative.  Past Medical History:  Diagnosis Date  . Hypertension    Past Surgical History:  Procedure Laterality Date  . APPENDECTOMY    . EYE SURGERY    . HAND SURGERY    . KNEE SURGERY     x3, x1 R  . TONSILLECTOMY     Social History:  reports that he has never smoked. He has never used smokeless tobacco. He reports that he does not drink alcohol  or use drugs.   Allergies  Allergen Reactions  . Bee Venom Anaphylaxis    Family History; Mother ; deceased, Heart problems, COPD.   Prior to Admission medications   Medication Sig Start Date End Date Taking? Authorizing Provider  lisinopril (PRINIVIL,ZESTRIL) 20 MG tablet Take 1 tablet (20 mg total) by mouth daily. 09/22/17  Yes Pollina, Gwenyth Allegra, MD  ciprofloxacin (CIPRO) 500 MG tablet Take 1 tablet (500 mg total) by mouth 2 (two) times daily. Patient not taking: Reported on 61/44/3154 00/8/67   Delora Fuel, MD  HYDROcodone-acetaminophen [facility]) 5-325 MG tablet Take 1 tablet by mouth every 4 (four) hours as needed for moderate pain. Patient not taking: Reported on 61/95/0932 67/1/24   Delora Fuel, MD  metroNIDAZOLE (FLAGYL) 500 MG tablet Take 1 tablet (500 mg total) by mouth 3 (three) times daily. Patient not taking: Reported on 58/09/9831 82/5/05   Delora Fuel, MD   Physical Exam: Vitals:   02/20/18 1500 02/20/18 1530 02/20/18 1600 02/20/18 1630  BP: (!) 180/105 (!) 179/110 (!) 188/102 (!) 178/107  Pulse: 68  68 64  Resp: 14 13 17 12   Temp:      TempSrc:      SpO2: 96%  97% 98%  Weight:      Height:         General exam: Moderately built and nourished patient, appears uncomfortable, in pain.  Head, eyes and ENT: Nontraumatic and normocephalic. Pupils equally  reacting to light and accommodation. Oral mucosa moist.  Neck: Supple. No JVD, carotid bruit or thyromegaly.  Lymphatics: No lymphadenopathy.  Respiratory system: Clear to auscultation. No increased work of breathing.  Cardiovascular system: S1 and S2 heard, RRR. No JVD, murmurs, gallops, clicks or pedal edema.  Gastrointestinal system: Abdomen is nondistended, soft and nontender. Normal bowel sounds heard. No organomegaly or masses appreciated.  Central nervous system: Alert and oriented. No focal neurological deficits.  Extremities: Symmetric 5 x 5 power. Peripheral pulses symmetrically felt.    Skin: No rashes or acute findings.  Musculoskeletal system: Negative exam.  Psychiatry: Pleasant and cooperative.   Labs on Admission:  Basic Metabolic Panel: Recent Labs  Lab 02/20/18 0939  NA 140  K 3.7  CL 107  CO2 26  GLUCOSE 106*  BUN 17  CREATININE 1.21  CALCIUM 9.6   Liver Function Tests: No results for input(s): AST, ALT, ALKPHOS, BILITOT, PROT, ALBUMIN in the last 168 hours. No results for input(s): LIPASE, AMYLASE in the last 168 hours. No results for input(s): AMMONIA in the last 168 hours. CBC: Recent Labs  Lab 02/20/18 0939  WBC 8.4  HGB 13.9  HCT 43.9  MCV 90.3  PLT 249   Cardiac Enzymes: No results for input(s): CKTOTAL, CKMB, CKMBINDEX, TROPONINI in the last 168 hours.  BNP (last 3 results) No results for input(s): PROBNP in the last 8760 hours. CBG: No results for input(s): GLUCAP in the last 168 hours.  Radiological Exams on Admission: Dg Chest 2 View  Result Date: 02/20/2018 CLINICAL DATA:  Chest pain EXAM: CHEST - 2 VIEW COMPARISON:  10/05/2015 FINDINGS: The heart size and mediastinal contours are within normal limits. Both lungs are clear. The visualized skeletal structures are unremarkable. IMPRESSION: No active cardiopulmonary disease. Electronically Signed   By: Franchot Gallo M.D.   On: 02/20/2018 08:29    EKG: Independently reviewed.  Left ventricular hypertrophy some ST elevation  Assessment/Plan Active Problems:   HTN (hypertension), malignant   Chest pain  1-Hypertensive emergency;  Patient present with chest pain, shortness of breath, systolic blood pressure 102. He is still having chest pain, his systolic blood pressure has decreased to 170.Marland Kitchen He received a dose of oral lisinopril.  We will continue with lisinopril daily. Will add Norvasc oral. PRN hydralazine.. Admit to step down unit.  Will check CT head, due to concern for lethargic, weakness from family.   2-Chest pain;  Present with chest pain, associated with  dyspnea, lies down, EKG with st elevation. Troponin negative. Differential pericarditis. Will get CT angio rule out dissection. Discussed with cardiology recommend STAT ECHO. They will follow in consultation.  Chest pain, atypical also, some component on palpation. Toradol PRN.  Continue to cycle enzymes.  Nitroglycerin PRN.  Check UDS>     DVT Prophylaxis: Lovenox Code Status: Full code Family Communication: Family at bedside Disposition Plan: Admit under observation to the stepdown unit for evaluation of chest pain and malignant hypertension.  Time spent: 75 minutes.     Elmarie Shiley MD [facility]   If 7PM-7AM, please contact night-coverage [email or web address] Password [facility]  02/20/2018, 4:53 PM

## 2018-02-20 NOTE — ED Notes (Signed)
Urine has been sent down.

## 2018-02-20 NOTE — Progress Notes (Signed)
  Echocardiogram 2D Echocardiogram has been performed.  Darlina Sicilian M 02/20/2018, 5:43 PM

## 2018-02-21 ENCOUNTER — Other Ambulatory Visit (HOSPITAL_COMMUNITY): Payer: Medicaid Other

## 2018-02-21 DIAGNOSIS — R079 Chest pain, unspecified: Secondary | ICD-10-CM | POA: Diagnosis not present

## 2018-02-21 LAB — CBC
HCT: 37 % — ABNORMAL LOW (ref 39.0–52.0)
Hemoglobin: 11.8 g/dL — ABNORMAL LOW (ref 13.0–17.0)
MCH: 29 pg (ref 26.0–34.0)
MCHC: 31.9 g/dL (ref 30.0–36.0)
MCV: 90.9 fL (ref 80.0–100.0)
Platelets: 225 10*3/uL (ref 150–400)
RBC: 4.07 MIL/uL — ABNORMAL LOW (ref 4.22–5.81)
RDW: 14.1 % (ref 11.5–15.5)
WBC: 7.6 10*3/uL (ref 4.0–10.5)
nRBC: 0 % (ref 0.0–0.2)

## 2018-02-21 LAB — TROPONIN I: Troponin I: <0.03 ng/mL (ref <0.03)

## 2018-02-21 LAB — BASIC METABOLIC PANEL
Anion gap: 7 (ref 5–15)
BUN: 20 mg/dL (ref 6–20)
CO2: 22 mmol/L (ref 22–32)
Calcium: 8.8 mg/dL — ABNORMAL LOW (ref 8.9–10.3)
Chloride: 111 mmol/L (ref 98–111)
Creatinine, Ser: 1.26 mg/dL — ABNORMAL HIGH (ref 0.61–1.24)
GFR calc Af Amer: >60 mL/min (ref >60)
GFR calc non Af Amer: >60 mL/min (ref >60)
Glucose, Bld: 112 mg/dL — ABNORMAL HIGH (ref 70–99)
Potassium: 3.9 mmol/L (ref 3.5–5.1)
Sodium: 140 mmol/L (ref 135–145)

## 2018-02-21 LAB — HIV ANTIBODY (ROUTINE TESTING W REFLEX): HIV Screen 4th Generation wRfx: NONREACTIVE

## 2018-02-21 MED ORDER — IBUPROFEN 200 MG PO TABS: 400.0000 mg | ORAL_TABLET | Freq: Four times a day (QID) | ORAL | 2 refills | Status: DC | PRN

## 2018-02-21 MED ORDER — AMLODIPINE BESYLATE 5 MG PO TABS: 5.0000 mg | ORAL_TABLET | Freq: Every day | ORAL | 2 refills | Status: DC

## 2018-02-21 MED ORDER — LISINOPRIL 20 MG PO TABS: 20.0000 mg | ORAL_TABLET | Freq: Every day | ORAL | 1 refills | Status: DC

## 2018-02-21 MED ORDER — ACETAMINOPHEN 325 MG PO TABS: 650.0000 mg | ORAL_TABLET | Freq: Four times a day (QID) | ORAL | Status: DC | PRN

## 2018-02-21 MED ORDER — ENOXAPARIN SODIUM 40 MG/0.4ML ~~LOC~~ SOLN
40.0000 mg | SUBCUTANEOUS | Status: DC
  Filled 2018-02-21: qty 0.4

## 2018-02-21 NOTE — Discharge Summary (Signed)
Physician Discharge Summary  Curtis Saunders BLT:[id number] DOB: 01-21-1956 DOA: 02/20/2018  PCP: Patient, [name]  Admit date: 02/20/2018 Discharge date: 02/21/2018  Time spent: 35 minutes  Recommendations for Outpatient Follow-up:  1. Recommend outpatient follow-up with primary care physician which we will arrange-patient will need labs as well as a creatinine in about 1 week given his elevated blood pressure 2. He has been advised to take ibuprofen 4 times a day to 6 times a day 400 mg 4 reproducible chest wall pain  Discharge Diagnoses:  Active Problems:   HTN (hypertension), malignant   Chest pain   Discharge Condition: Improved  Diet recommendation: Heart healthy  Filed Weights   02/20/18 0755  Weight: 94.8 kg    History of present illness:  This is a 63 year old male who has known history of hypertension and is noncompliant with his medication He came in to the hospital because of pain after having intercourse 45 minutes after the event The pain was 10 on 10 and it was severe that made him cry He had a work-up in the hospital inclusive of CT angiogram of the chest echo showing LVH and mild diastolic dysfunction he had a CT of the head that was negative and his pain was still present although it was very reproducible on discharge exam nurse exam Given his negative troponins as well as reassuring EKG and baseline bradycardia I do not feel that this is acute coronary syndrome if of concern is that he does have some risk factors including uncontrolled hypertension so I have increased    Discharge Exam: Vitals:   02/21/18 0700 02/21/18 0730  BP: (!) 182/104 (!) 161/106  Pulse: (!) 58 (!) 57  Resp: 19 11  Temp:    SpO2: 95% 100%    General: awake alert pleasant in nad Cardiovascular: s1 s 2no m Respiratory: chest clear no added sound no rales no rhonchi abd soft reporducible CP across chest wall Neuro intact  Discharge Instructions   Discharge Instructions     Diet - low sodium heart healthy   Complete by:  As directed    Discharge instructions   Complete by:  As directed    It was not felt that you had cardiac chest pain instead I think you probably had may be irritation of your rib joint and you will need to take ibuprofen 400 mg every 6 hours with food for the time being You will need to follow-up with outpatient physicians in about 1 to 2 weeks and we will attempt to ensure that you have this follow-up-it is unlikely but if they feel you still have chest pain that is concerning you will need cardiology referral for further work-up Please get labs in the next week   Increase activity slowly   Complete by:  As directed      Allergies as of 02/21/2018      Reactions   Bee Venom Anaphylaxis      Medication List    STOP taking these medications   ciprofloxacin 500 MG tablet Commonly known as:  CIPRO   HYDROcodone-acetaminophen 5-325 MG tablet Commonly known as:  NORCO   metroNIDAZOLE 500 MG tablet Commonly known as:  FLAGYL     TAKE these medications   acetaminophen 325 MG tablet Commonly known as:  TYLENOL Take 2 tablets (650 mg total) by mouth every 6 (six) hours as needed for mild pain (or Fever >/= 101).   amLODipine 5 MG tablet Commonly known as:  NORVASC  Take 1 tablet (5 mg total) by mouth daily.   ibuprofen 200 MG tablet Commonly known as:  MOTRIN IB Take 2 tablets (400 mg total) by mouth every 6 (six) hours as needed.   lisinopril 20 MG tablet Commonly known as:  PRINIVIL,ZESTRIL Take 1 tablet (20 mg total) by mouth daily.      Allergies  Allergen Reactions  . Bee Venom Anaphylaxis      The results of significant diagnostics from this hospitalization (including imaging, microbiology, ancillary and laboratory) are listed below for reference.    Significant Diagnostic Studies: Dg Chest 2 View  Result Date: 02/20/2018 CLINICAL DATA:  Chest pain EXAM: CHEST - 2 VIEW COMPARISON:  10/05/2015 FINDINGS: The  heart size and mediastinal contours are within normal limits. Both lungs are clear. The visualized skeletal structures are unremarkable. IMPRESSION: No active cardiopulmonary disease. Electronically Signed   By: Franchot Gallo M.D.   On: 02/20/2018 08:29   Ct Head Wo Contrast  Result Date: 02/20/2018 CLINICAL DATA:  Fatigue. Shortness of breath and chest pain. Hypertension. EXAM: CT HEAD WITHOUT CONTRAST TECHNIQUE: Contiguous axial images were obtained from the base of the skull through the vertex without intravenous contrast. COMPARISON:  CT chest January 06, 2017 FINDINGS: BRAIN: No intraparenchymal hemorrhage, mass effect nor midline shift. No parenchymal brain volume loss for age. No hydrocephalus. No acute large vascular territory infarcts. No abnormal extra-axial fluid collections. Basal cisterns are patent. VASCULAR: Moderate calcific atherosclerosis of the carotid siphons. SKULL: No skull fracture. No significant scalp soft tissue swelling. SINUSES/ORBITS: Trace paranasal sinus mucosal thickening. Mastoid air cells are well aerated.The included ocular globes and orbital contents are non-suspicious. OTHER: None. IMPRESSION: Negative CT HEAD without contrast for age. Electronically Signed   By: Elon Alas M.D.   On: 02/20/2018 18:41   Ct Angio Chest/abd/pel For Dissection W And/or W/wo  Result Date: 02/20/2018 CLINICAL DATA:  Chest pain. EXAM: CT ANGIOGRAPHY CHEST, ABDOMEN AND PELVIS TECHNIQUE: Multidetector CT imaging through the chest, abdomen and pelvis was performed using the standard protocol during bolus administration of intravenous contrast. Multiplanar reconstructed images and MIPs were obtained and reviewed to evaluate the vascular anatomy. CONTRAST:  150mL ISOVUE-370 IOPAMIDOL (ISOVUE-370) INJECTION 76% COMPARISON:  CT scan of October 30, 2017. FINDINGS: CTA CHEST FINDINGS Cardiovascular: Preferential opacification of the thoracic aorta. No evidence of thoracic aortic aneurysm or  dissection. Normal heart size. No pericardial effusion. Mediastinum/Nodes: No enlarged mediastinal, hilar, or axillary lymph nodes. Thyroid gland, trachea, and esophagus demonstrate no significant findings. Lungs/Pleura: Lungs are clear. No pleural effusion or pneumothorax. Musculoskeletal: No chest wall abnormality. No acute or significant osseous findings. Review of the MIP images confirms the above findings. CTA ABDOMEN AND PELVIS FINDINGS VASCULAR Aorta: Normal caliber aorta without aneurysm, dissection, vasculitis or significant stenosis. Celiac: Patent without evidence of aneurysm, dissection, vasculitis or significant stenosis. SMA: Patent without evidence of aneurysm, dissection, vasculitis or significant stenosis. Renals: Both renal arteries are patent without evidence of aneurysm, dissection, vasculitis, fibromuscular dysplasia or significant stenosis. IMA: Patent without evidence of aneurysm, dissection, vasculitis or significant stenosis. Inflow: Patent without evidence of aneurysm, dissection, vasculitis or significant stenosis. Veins: No obvious venous abnormality within the limitations of this arterial phase study. Review of the MIP images confirms the above findings. NON-VASCULAR Hepatobiliary: No focal liver abnormality is seen. No gallstones, gallbladder wall thickening, or biliary dilatation. Pancreas: Unremarkable. No pancreatic ductal dilatation or surrounding inflammatory changes. Spleen: Normal in size without focal abnormality. Adrenals/Urinary Tract: Adrenal glands appear normal. Large right  renal cyst is noted. No hydronephrosis or renal obstruction is noted. No renal or ureteral calculi are noted. Urinary bladder is unremarkable. Stomach/Bowel: The stomach appears normal. There is no evidence of bowel obstruction. Status post appendectomy. Lymphatic: No significant vascular findings are present. No enlarged abdominal or pelvic lymph nodes. Reproductive: Prostate is unremarkable. Other: No  abdominal wall hernia or abnormality. No abdominopelvic ascites. Musculoskeletal: No acute or significant osseous findings. Review of the MIP images confirms the above findings. IMPRESSION: No evidence of thoracic or abdominal aortic dissection or aneurysm. No significant abnormality seen in the chest, abdomen or pelvis. Electronically Signed   By: Marijo Conception, M.D.   On: 02/20/2018 18:52    Microbiology: No results found for this or any previous visit (from the past 240 hour(s)).   Labs: Basic Metabolic Panel: Recent Labs  Lab 02/20/18 0939 02/21/18 0500  NA 140 140  K 3.7 3.9  CL 107 111  CO2 26 22  GLUCOSE 106* 112*  BUN 17 20  CREATININE 1.21 1.26*  CALCIUM 9.6 8.8*   Liver Function Tests: No results for input(s): AST, ALT, ALKPHOS, BILITOT, PROT, ALBUMIN in the last 168 hours. No results for input(s): LIPASE, AMYLASE in the last 168 hours. No results for input(s): AMMONIA in the last 168 hours. CBC: Recent Labs  Lab 02/20/18 0939 02/21/18 0500  WBC 8.4 7.6  HGB 13.9 11.8*  HCT 43.9 37.0*  MCV 90.3 90.9  PLT 249 225   Cardiac Enzymes: Recent Labs  Lab 02/20/18 1714 02/21/18 0001  TROPONINI <0.03 <0.03   BNP: BNP (last 3 results) No results for input(s): BNP in the last 8760 hours.  ProBNP (last 3 results) No results for input(s): PROBNP in the last 8760 hours.  CBG: No results for input(s): GLUCAP in the last 168 hours.     Signed:  Nita Sells MD   [facility] 02/21/2018, 8:35 AM

## 2018-02-21 NOTE — ED Notes (Signed)
Hospitalist paged b/c pt stated that they want to leave and would like to sleep in his own bed.

## 2018-02-21 NOTE — ED Notes (Signed)
Pt requesting update from admitting md and wants to know when he is getting a bed.

## 2018-02-23 DIAGNOSIS — I517 Cardiomegaly: Secondary | ICD-10-CM | POA: Insufficient documentation

## 2018-03-05 NOTE — Progress Notes (Signed)
Cardiology Office Note   Date:  03/06/2018   ID:  Curtis Saunders, DOB 06-Jun-1955, MRN [id number]  PCP:  Patient, [name]  Cardiologist:   Minus Breeding, MD Referring:  ED provider  Chief Complaint  Patient presents with  . Chest Pain      History of Present Illness: Curtis Saunders is a 63 y.o. male who is referred by ED for evaluation of chest pain.  He was in the ED in late Jan for chest pain.  I reviewed these records for this visit.    There was no evidence objective evidence of ischemia.   The patient had moderate LVH on echo.    The patient has had no prior cardiac history.  He is not sure whether he has had hypertension as he does not really get it checked.  The morning he presented for evaluation he was having chest discomfort.  This was severe and sharp.  It on his mid to left chest.  There was no radiation.  There was associated shortness of breath.  He was not nauseated and did not have any vomiting.  It lasted total for over 24 hours.  He presented to the emergency room.  He was noted there to have negative cardiac enzymes.  CT of the chest demonstrated no acute abnormalities.  Apparently because he had headaches he also had a CT of his head which demonstrated no acute abnormalities.  He was treated with morphine, ketorolac and nitroglycerin.  He said his symptoms eventually resolved.  He since has not had this discomfort.  His blood pressure was difficult to control and he was sent home on Norvasc and lisinopril.  He apparently had not been particularly compliant with medications.  Past Medical History:  Diagnosis Date  . Hypertension     Past Surgical History:  Procedure Laterality Date  . APPENDECTOMY    . EYE SURGERY    . HAND SURGERY    . KNEE SURGERY     x3, x1 R  . TONSILLECTOMY       Current Outpatient Medications  Medication Sig Dispense Refill  . amLODipine (NORVASC) 10 MG tablet Take 1 tablet (10 mg total) by mouth daily. 90 tablet 3  .  ibuprofen (MOTRIN IB) 200 MG tablet Take 2 tablets (400 mg total) by mouth every 6 (six) hours as needed. 100 tablet 2  . lisinopril (PRINIVIL,ZESTRIL) 20 MG tablet Take 1 tablet (20 mg total) by mouth daily. 30 tablet 1  . metoprolol tartrate (LOPRESSOR) 100 MG tablet Take 1 tablet (100 mg total) by mouth once for 1 dose. 1 tablet 0   No current facility-administered medications for this visit.     Allergies:   Bee venom    Social History:  The patient  reports that he has never smoked. He has never used smokeless tobacco. He reports that he does not drink alcohol or use drugs.   Family History:  The patient's family history includes COPD in his mother; Emphysema in his mother; Heart attack (age of onset: 36) in his mother; Hypertension in his sister.    ROS:  Please see the history of present illness.   Otherwise, review of systems are positive for erectile dysfunction.   All other systems are reviewed and negative.    PHYSICAL EXAM: VS:  BP (!) 182/110   Pulse 68   Ht 5\' 11"  (1.803 m)   Wt 214 lb (97.1 kg)   BMI 29.85 kg/m  ,  BMI Body mass index is 29.85 kg/m. GENERAL:  Well appearing HEENT:  Pupils equal round and reactive, fundi not visualized, oral mucosa unremarkable, poor dentition NECK:  No jugular venous distention, waveform within normal limits, carotid upstroke brisk and symmetric, no bruits, no thyromegaly LYMPHATICS:  No cervical, inguinal adenopathy LUNGS:  Clear to auscultation bilaterally BACK:  No CVA tenderness CHEST:  Unremarkable HEART:  PMI not displaced or sustained,S1 and S2 within normal limits, no S3, no S4, no clicks, no rubs, no murmurs ABD:  Flat, positive bowel sounds normal in frequency in pitch, no bruits, no rebound, no guarding, no midline pulsatile mass, no hepatomegaly, no splenomegaly EXT:  2 plus pulses throughout, no edema, no cyanosis no clubbing SKIN:  No rashes no nodules NEURO:  Cranial nerves II through XII grossly intact, motor grossly  intact throughout PSYCH:  Cognitively intact, oriented to person place and time    EKG:  EKG is ordered today. The ekg ordered today demonstrates sinus bradycardia, rate 60, axis within normal limits, left ventricular hypertrophy by voltage criteria, T wave inversions consistent with repolarization changes but could not exclude ischemia   Recent Labs: 10/30/2017: ALT 15 02/21/2018: BUN 20; Creatinine, Ser 1.26; Hemoglobin 11.8; Platelets 225; Potassium 3.9; Sodium 140    Lipid Panel    Component Value Date/Time   CHOL 165 02/20/2018 1714   TRIG 47 02/20/2018 1714   HDL 56 02/20/2018 1714   CHOLHDL 2.9 02/20/2018 1714   VLDL 9 02/20/2018 1714   LDLCALC 100 (H) 02/20/2018 1714      Wt Readings from Last 3 Encounters:  03/06/18 214 lb (97.1 kg)  02/20/18 209 lb (94.8 kg)  12/18/17 218 lb (98.9 kg)      Other studies Reviewed: Additional studies/ records that were reviewed today include:  ED and hospital records. Review of the above records demonstrates:  Please see elsewhere in the note.     ASSESSMENT AND PLAN:  CHEST PAIN:     The patient's chest pain was atypical.  There was no evidence of dissection.  He had negative enzymes despite several hours of pain.  He does have an abnormal EKG as above but this is more consistent with repolarization changes although his T wave inversion was not evident recently.  He will need coronary CTA to evaluate this.    HTN: His blood pressure is not well controlled.  He had to be given clonidine here in the office to bring his pressure down.  I am getting increase his amlodipine to 10 mg daily.  He needs to be compliant with his medications.  He thinks the lisinopril is causing erectile dysfunction.  However, at this point I cannot really discontinue this.  He will need further up med titration.  I will have him come back to the hypertension clinic for aggressive titration of his medications.  Of note prior to leaving the office his blood  pressure had come down with clonidine  LVH: This is almost definitely related to his uncontrolled hypertension and will be followed clinically with repeat echocardiograms going forward.   Current medicines are reviewed at length with the patient today.  The patient does not have concerns regarding medicines.  The following changes have been made:  As above.  Labs/ tests ordered today include:   Orders Placed This Encounter  Procedures  . CT CORONARY MORPH W/CTA COR W/SCORE W/CA W/CM &/OR WO/CM  . CT CORONARY FRACTIONAL FLOW RESERVE DATA PREP  . CT CORONARY FRACTIONAL FLOW RESERVE  FLUID ANALYSIS  . Basic Metabolic Panel (BMET)     Disposition:   FU with HTN clinic in one week.     Signed, Minus Breeding, MD  03/06/2018 12:30 PM    [facility]

## 2018-03-06 ENCOUNTER — Ambulatory Visit (INDEPENDENT_AMBULATORY_CARE_PROVIDER_SITE_OTHER): Payer: Medicaid Other | Admitting: Cardiology

## 2018-03-06 ENCOUNTER — Encounter: Payer: Self-pay | Admitting: Cardiology

## 2018-03-06 VITALS — BP 182/110 | HR 68 | Ht 71.0 in | Wt 214.0 lb

## 2018-03-06 DIAGNOSIS — R079 Chest pain, unspecified: Secondary | ICD-10-CM

## 2018-03-06 DIAGNOSIS — I10 Essential (primary) hypertension: Secondary | ICD-10-CM | POA: Diagnosis not present

## 2018-03-06 DIAGNOSIS — I517 Cardiomegaly: Secondary | ICD-10-CM | POA: Diagnosis not present

## 2018-03-06 MED ORDER — CLONIDINE HCL 0.1 MG PO TABS
0.1000 mg | ORAL_TABLET | Freq: Once | ORAL | Status: AC
  Administered 2018-03-06: 0.1 mg via ORAL

## 2018-03-06 MED ORDER — METOPROLOL TARTRATE 100 MG PO TABS: 100.0000 mg | ORAL_TABLET | Freq: Once | ORAL | 0 refills | Status: DC

## 2018-03-06 MED ORDER — AMLODIPINE BESYLATE 10 MG PO TABS: 10.0000 mg | ORAL_TABLET | Freq: Every day | ORAL | 3 refills | Status: DC

## 2018-03-06 NOTE — Patient Instructions (Signed)
Medication Instructions:  START- Amlodipine 10 mg daily TAKE- Metoprolol 100 mg 2 hours before procedure  If you need a refill on your cardiac medications before your next appointment, please call your pharmacy.  Labwork: None Ordered HERE IN OUR OFFICE AT [facility]  You will need to fast. DO NOT EAT OR DRINK PAST MIDNIGHT.     You will NOT need to fast   Take the provided lab slips with you to the lab for your blood draw.   When you have your labs (blood work) drawn today and your tests are completely normal, you will receive your results only by MyChart Message (if you have MyChart) -OR-  A paper copy in the mail.  If you have any lab test that is abnormal or we need to change your treatment, we will call you to review these results.  Testing/Procedures: Non-Cardiac CT Angiography (CTA), is a special type of CT scan that uses a computer to produce multi-dimensional views of major blood vessels throughout the body. In CT angiography, a contrast material is injected through an IV to help visualize the blood vessels  Special Instructions: Please arrive at the [facility] main entrance of [facility] at                      AM (30-45 minutes prior to test start time)  [facility] [address], [place] [address] [phone]  Proceed to the [facility] Radiology Department (First Floor).  Please follow these instructions carefully (unless otherwise directed):  Hold all erectile dysfunction medications at least 48 hours prior to test.  On the Night Before the Test: . Be sure to Drink plenty of water. . Do not consume any caffeinated/decaffeinated beverages or chocolate 12 hours prior to your test. . Do not take any antihistamines 12 hours prior to your test. . If you take Metformin do not take 24 hours prior to test.   On the Day of the Test: . Drink plenty of water. Do not drink any water within one hour of the test. . Do not eat any food 4 hours prior  to the test. . You may take your regular medications prior to the test.  . Take metoprolol (Lopressor) two hours prior to test.       After the Test: . Drink plenty of water. . After receiving IV contrast, you may experience a mild flushed feeling. This is normal. . On occasion, you may experience a mild rash up to 24 hours after the test. This is not dangerous. If this occurs, you can take Benadryl 25 mg and increase your fluid intake. . If you experience trouble breathing, this can be serious. If it is severe call 911 IMMEDIATELY. If it is mild, please call our office. . If you take any of these medications: Glipizide/Metformin, Avandament, Glucavance, please do not take 48 hours after completing test.   Follow-Up: . Your physician recommends that you schedule a follow-up appointment in: Hypertension clinic 1 week   At [facility], you and your health needs are our priority.  As part of our continuing mission to provide you with exceptional heart care, we have created designated Provider Care Teams.  These Care Teams include your primary Cardiologist (physician) and Advanced Practice Providers (APPs -  Physician Assistants and Nurse Practitioners) who all work together to provide you with the care you need, when you need it.  Thank you for choosing [facility] at [facility]!!

## 2018-03-12 NOTE — Addendum Note (Signed)
Addended by: Vennie Homans on: 03/12/2018 09:38 AM   Modules accepted: Orders

## 2018-03-14 ENCOUNTER — Ambulatory Visit (INDEPENDENT_AMBULATORY_CARE_PROVIDER_SITE_OTHER): Payer: Medicaid Other | Admitting: Pharmacist

## 2018-03-14 VITALS — BP 178/98 | HR 72 | Resp 15 | Ht 71.0 in | Wt 216.0 lb

## 2018-03-14 DIAGNOSIS — I10 Essential (primary) hypertension: Secondary | ICD-10-CM

## 2018-03-14 MED ORDER — RAMIPRIL 10 MG PO CAPS: 10.0000 mg | ORAL_CAPSULE | Freq: Every day | ORAL | 3 refills | Status: DC

## 2018-03-14 NOTE — Progress Notes (Signed)
Patient ID: JEMMIE RHINEHART                 DOB: 10-Mar-1955                      MRN: [id number]     HPI: Curtis Saunders is a 63 y.o. male referred by Dr. Percival Spanish to HTN clinic. PMH includes chest pain, left ventricular hypertrophy and malignant hypertension. No chest pain, swelling, dizziness, blurry vision, or headaches. He is NOT taking lisinopril because its "take away his nature". He is complaining about ED while taking lisinopril and will take it is needed but prefers another medication.   Current HTN meds:  Amlodipine 10mg  daily in AM Lisinopril 20mg  daily - not taking  BP goal: 130/80  Family History: The patient's family history includes COPD in his mother; Emphysema in his mother; Heart attack (age of onset: 62) in his mother; hypertension in his sister.   Social History: The patient  reports that he has never smoked. He has never used smokeless tobacco. He reports that he does not drink alcohol or use drugs.   Diet: low sodium diet, baked food  Exercise: activities of daily living  Home BP readings: none - no home BP device  Wt Readings from Last 3 Encounters:  03/14/18 216 lb (98 kg)  03/06/18 214 lb (97.1 kg)  02/20/18 209 lb (94.8 kg)   BP Readings from Last 3 Encounters:  03/14/18 (!) 178/98  03/06/18 (!) 182/110  02/21/18 (!) 181/111   Pulse Readings from Last 3 Encounters:  03/14/18 72  03/06/18 68  02/21/18 64    Past Medical History:  Diagnosis Date  . Hypertension     Current Outpatient Medications on File Prior to Visit  Medication Sig Dispense Refill  . amLODipine (NORVASC) 10 MG tablet Take 1 tablet (10 mg total) by mouth daily. 90 tablet 3   No current facility-administered medications on file prior to visit.     Allergies  Allergen Reactions  . Bee Venom Anaphylaxis    Blood pressure (!) 178/98, pulse 72, resp. rate 15, height 5\' 11"  (1.803 m), weight 216 lb (98 kg), SpO2 98 %.  Essential hypertension Blood pressure remains  elevated, but slightly improved since from last office visit. Patient is NOT taking his lisinopril and denies headaches, dizziness, chest pain, or blurry vision. Will continue amlodipine 10mg  daily, add ramipril 10mg  to current regimen, and repeat BMET in 2-3 weeks. Plan to follow up in 4 weeks with HTN clinic and continue close monitoring due to compliance issues.   Patient dose not have primary care provider and thinks is a waist of time having PCP. Plans to f/u with ophthalmologist soon due to visual impairment.  He still waiting for insurance response to schedule cardiac CT ASAP.   Lani Mendiola Rodriguez-Guzman PharmD, BCPS, [place] [address] 03/14/2018 8:49 AM

## 2018-03-14 NOTE — Patient Instructions (Signed)
Return for a follow up appointment in   Go to the lab in 2 weeks  Check your blood pressure at home daily (if able) and keep record of the readings.  Take your BP meds as follows: *START taking ramipril 10mg  daily* *CONTINUE taking amlodipine 10mg  daily*  Bring all of your meds, your BP cuff and your record of home blood pressures to your next appointment.  Exercise as you're able, try to walk approximately 30 minutes per day.  Keep salt intake to a minimum, especially watch canned and prepared boxed foods.  Eat more fresh fruits and vegetables and fewer canned items.  Avoid eating in fast food restaurants.    HOW TO TAKE YOUR BLOOD PRESSURE: . Rest 5 minutes before taking your blood pressure. .  Don't smoke or drink caffeinated beverages for at least 30 minutes before. . Take your blood pressure before (not after) you eat. . Sit comfortably with your back supported and both feet on the floor (don't cross your legs). . Elevate your arm to heart level on a table or a desk. . Use the proper sized cuff. It should fit smoothly and snugly around your bare upper arm. There should be enough room to slip a fingertip under the cuff. The bottom edge of the cuff should be 1 inch above the crease of the elbow. . Ideally, take 3 measurements at one sitting and record the average.

## 2018-03-14 NOTE — Assessment & Plan Note (Signed)
Blood pressure remains elevated, but slightly improved since from last office visit. Patient is NOT taking his lisinopril and denies headaches, dizziness, chest pain, or blurry vision. Will continue amlodipine 10mg  daily, add ramipril 10mg  to current regimen, and repeat BMET in 2-3 weeks. Plan to follow up in 4 weeks with HTN clinic and continue close monitoring due to compliance issues.   Patient dose not have primary care provider and thinks is a waist of time having PCP. Plans to f/u with ophthalmologist soon due to visual impairment.  He still waiting for insurance response to schedule cardiac CT ASAP.

## 2018-03-17 ENCOUNTER — Telehealth: Payer: Self-pay | Admitting: Internal Medicine

## 2018-03-17 NOTE — Telephone Encounter (Signed)
Cardiology Moonlighter Note  Return page from patient.  Was recently started on ramipril for hypertension.  Since he is started taking this he has felt nauseous and dizzy.  Does not have a blood pressure cuff at home and thus has not been checking his blood pressure.  Has not vomited.  No chest pain, chest pressure, syncope, presyncope, shortness of breath.  Not sure whether to continue taking ramipril or not.  I recommended patient hold off on taking any more ramipril until hearing back from his outpatient cardiologist.  A copy of this note will be sent to his cardiologist, Dr. Percival Spanish.  Marcie Mowers, MD Cardiology Fellow, PGY-6

## 2018-03-18 NOTE — Telephone Encounter (Signed)
We can stop the lisinopril and start Cozaar 50 mg po daily disp number 30 with 11 refills.  Call Curtis Saunders with the results and send results to Patient, [name]

## 2018-03-19 ENCOUNTER — Encounter: Payer: Self-pay | Admitting: Cardiology

## 2018-03-19 MED ORDER — LOSARTAN POTASSIUM 50 MG PO TABS: 50.0000 mg | ORAL_TABLET | Freq: Every day | ORAL | 3 refills | Status: DC

## 2018-03-19 NOTE — Telephone Encounter (Signed)
New Message    *STAT* If patient is at the pharmacy, call can be transferred to refill team.   1. Which medications need to be refilled? (please list name of each medication and dose if known) Patient states it's pain medication and he's not sure of the name   2. Which pharmacy/location (including street and city if local pharmacy) is medication to be sent to? [facility] on [address]  3. Do they need a 30 day or 90 day supply? 30 day supply  Please call patient

## 2018-03-19 NOTE — Telephone Encounter (Signed)
Pt called to report that he has been having body aches, fever, headache and does not feel well he is asking for pain medication... I asked if the pt has had a flu shot and he has had one last month at the hospital... I  advised him that he may want to go to the outpt clinic to be seen.. he is also worried that his BP is getting too low with the new med.. Cozaar.. pt will also have it checked there since he does not have a way of checking it on his own.

## 2018-03-26 NOTE — Telephone Encounter (Signed)
Losartan was send into pt pharmacy

## 2018-04-09 NOTE — Telephone Encounter (Signed)
Please reschedule for mid April cvrr hypertension visit at [facility]  This encounter was created in error - please disregard.

## 2018-04-11 ENCOUNTER — Telehealth: Payer: Self-pay | Admitting: Pharmacist Clinician (PhC)/ Clinical Pharmacy Specialist

## 2018-04-11 ENCOUNTER — Ambulatory Visit: Payer: Medicaid Other

## 2018-04-11 MED ORDER — SILDENAFIL CITRATE 100 MG PO TABS: 100.0000 mg | ORAL_TABLET | Freq: Every day | ORAL | 0 refills | Status: DC | PRN

## 2018-04-11 NOTE — Telephone Encounter (Signed)
When speaking with patient about need for him to come for OV this week, patient noted that his blood pressure was doing well at home, but was causing him ED issues.  Reviewed with Dr. Percival Spanish and will prescribe Viagra 100 mg.  Patient not currently on any nitrate medications.

## 2018-05-28 ENCOUNTER — Telehealth: Payer: Self-pay | Admitting: Cardiology

## 2018-05-28 NOTE — Telephone Encounter (Signed)
Sorry.  I don't think that Cialis works any better.

## 2018-05-28 NOTE — Telephone Encounter (Signed)
 *  STAT* If patient is at the pharmacy, call can be transferred to refill team.   1. Which medications need to be refilled? (please list name of each medication and dose if known)   losartan (COZAAR) 50 MG tablet amLODipine (NORVASC) 10 MG tablet   2. Which pharmacy/location (including street and city if local pharmacy) is medication to be sent to? [place]  3. Do they need a 30 day or 90 day supply? 90 days  Patient also needs the Viagra filled but he said they didn't work and he wants to know if there is something else that can replace it.

## 2018-05-30 ENCOUNTER — Telehealth (HOSPITAL_COMMUNITY): Payer: Self-pay | Admitting: Emergency Medicine

## 2018-05-30 NOTE — Telephone Encounter (Signed)
Reaching out to patient to offer assistance regarding upcoming cardiac imaging study; pt verbalizes understanding of appt date/time, parking situation and where to check in, pre-test NPO status and medications ordered, and verified current allergies; name and call back number provided for further questions should they arise Curtis Bond RN Navigator Cardiac Imaging [facility] [phone] office [phone] cell  Pt denies symptoms of COVID 19, verbalizes understanding to withhold ED medications prior to scan

## 2018-05-31 ENCOUNTER — Other Ambulatory Visit: Payer: Self-pay

## 2018-05-31 ENCOUNTER — Ambulatory Visit (HOSPITAL_COMMUNITY)
Admission: RE | Admit: 2018-05-31 | Discharge: 2018-05-31 | Disposition: A | Discharge status: Home / Self Care | Payer: Medicaid Other | Source: Ambulatory Visit | Attending: Cardiology | Admitting: Cardiology

## 2018-05-31 ENCOUNTER — Encounter (HOSPITAL_COMMUNITY): Payer: Self-pay

## 2018-05-31 DIAGNOSIS — R079 Chest pain, unspecified: Secondary | ICD-10-CM | POA: Diagnosis not present

## 2018-05-31 DIAGNOSIS — I719 Aortic aneurysm of unspecified site, without rupture: Secondary | ICD-10-CM | POA: Diagnosis not present

## 2018-05-31 LAB — POCT I-STAT CREATININE: Creatinine, Ser: 1.2 mg/dL (ref 0.61–1.24)

## 2018-05-31 MED ORDER — IOHEXOL 350 MG/ML SOLN
80.0000 mL | Freq: Once | INTRAVENOUS | Status: AC | PRN
  Administered 2018-05-31: 80 mL via INTRAVENOUS

## 2018-05-31 MED ORDER — NITROGLYCERIN 0.4 MG SL SUBL
0.8000 mg | SUBLINGUAL_TABLET | Freq: Once | SUBLINGUAL | Status: AC
  Administered 2018-05-31: 0.8 mg via SUBLINGUAL
  Filled 2018-05-31: qty 25

## 2018-05-31 MED ORDER — NITROGLYCERIN 0.4 MG SL SUBL
SUBLINGUAL_TABLET | SUBLINGUAL | Status: AC
  Filled 2018-05-31: qty 2

## 2018-05-31 NOTE — Discharge Instructions (Signed)
What You Need to Know About IV Contrast Material °IV contrast material is most often a fluid that is used with some imaging tests. Contrast material is injected into your body through a vein to help your health care providers see your organs and tissues more clearly. It may be used with: °· X-ray. °· MRI. °· CT. °· Ultrasound. °Contrast material is used when your health care providers need a detailed look at organs, tissues, or blood vessels that may not show up with the standard test. IV contrast may be used for imaging tests that examine: °· Muscles, skin, and fat. °· Breasts. °· Brain. °· Digestive tract. °· Heart. °· Liver. °· Lungs and many other internal organs. °What are the risks of using IV contrast material? °The risks of using IV contrast material include: °· Headache. °· Itching, skin rash, and hives. °· Allergic reactions. °· Nausea and vomiting. °· Wheezing or difficulty breathing. °· Abnormal heart rate. °· Blood pressure changes. °· Throat swelling. °· Kidney damage. °These complications are more likely to occur in people who: °· Have kidney failure. °· Have liver problems. °· Have certain heart problems, including: °? Heart failure. °? Heart attack. °? Heart infection. °? Heart valve problems. °· Abuse alcohol. °· Have allergies or asthma. °· Are dehydrated. °· Have sickle cell anemia or similar problems. °· Have had trouble with IV contrast material in the past. °· Take certain medicines, such as: °? Metformin. °? NSAIDs. °? Beta blockers. °? Interleukin-2. °How do I prepare for my test with IV contrast material? °· Follow instructions from your health care provider about eating or drinking restrictions. °· Ask your health care provider about changing or stopping your regular medicines. This is especially important if you are taking diabetes medicines or blood thinners. °· Tell your health care provider about: °? Any previous illnesses, surgeries, or pre-existing medical conditions. °? Whether you  are pregnant or may be pregnant. °? Whether you are breastfeeding. Most contrast agents are safe for use in breastfeeding women. °· You may have a physical exam to determine any potential risks. °· Ask if you will be given a medicine (sedative) to help you relax during the procedure. If so, plan to have someone take you home after test. °What happens during the test with IV contrast material? ° °· You may be given a sedative to help you relax. °· A needle will be inserted into one of your veins to administer the IV contrast material. °· You may feel warmth or flushing as the material enters your bloodstream. °· You may have a metallic taste in your mouth for a few minutes. °· The needle may cause some discomfort and bruising. °· After the contrast material is in your body, the imaging test will be done. °The procedure may vary among health care providers and hospitals. °What happens after the test with IV contrast material? °· You may be asked to drink water or other fluids to wash (flush) the contrast material out of your body. °· Drink enough fluid to keep your urine pale yellow. °· Do not drive for 24 hours if you received a sedative. °· It is your responsibility to get your test results. Ask your health care provider or the department performing the test when your results will be ready. °Contact a health care provider if: °· You have redness, swelling, or pain near your IV site. °Get help right away if: °· You have an abnormal heart rhythm. °· You have trouble breathing. °· You have: °?   Chest pain. °? Pain in your back, neck, arm, jaw, or stomach. °? Nausea or sweating. °? Hives or a rash. °· You start shaking and cannot stop. °These symptoms may represent a serious problem that is an emergency. Do not wait to see if the symptoms will go away. Get medical help right away. Call your local emergency services (911 in the [place] Do not drive yourself to the hospital. °Summary °· IV contrast may be used for imaging  tests to help your health care providers see your organs and tissues more clearly. °· Tell your health care provider if you are pregnant or may be pregnant. °· During the procedure, you may feel warmth or flushing as the material enters your bloodstream. °· After the procedure, drink enough fluid to keep your urine pale yellow. °This information is not intended to replace advice given to you by your health care provider. Make sure you discuss any questions you have with your health care provider. °Document Released: 12/28/2008 Document Revised: 09/03/2017 Document Reviewed: 09/16/2014 °Elsevier Interactive Patient Education © 2019 [employer] ° °

## 2018-05-31 NOTE — Progress Notes (Signed)
Pt BP elevated prior to exam (see vitals).  Pt had an episode of vomiting post IV contrast admin.  No further N/V episodes after initial.  Pt c/o slight headache, offered caffeine, pt declined.  Notified Dr. Aundra Dubin of the pre BP and vomiting.  Post BP 164/91.  Pt OK to discharge.  PIV removed and dressing applied.  Pt given verbal instructions post IV contrast admin.  Pt discharged

## 2018-06-04 ENCOUNTER — Telehealth: Payer: Self-pay | Admitting: *Deleted

## 2018-06-04 DIAGNOSIS — I7789 Other specified disorders of arteries and arterioles: Secondary | ICD-10-CM

## 2018-06-04 NOTE — Telephone Encounter (Signed)
-----   Message from Minus Breeding, MD sent at 06/03/2018  4:59 PM EDT ----- No evidence of coronary disease at all.  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  Can you please schedule this and follow up with me after this.  Call Mr. Montagna with the results

## 2018-06-04 NOTE — Telephone Encounter (Signed)
Pt aware of his CTA, order place for 1 year

## 2018-06-05 ENCOUNTER — Other Ambulatory Visit: Payer: Self-pay | Admitting: Cardiology

## 2018-06-05 NOTE — Telephone Encounter (Signed)
° ° °*  STAT* If patient is at the pharmacy, call can be transferred to refill team.   1. Which medications need to be refilled? (please list name of each medication and dose if known) sildenafil (VIAGRA) 100 MG tablet  2. Which pharmacy/location (including street and city if local pharmacy) is medication to be sent to? [facility] [place] [address]  3. Do they need a 30 day or 90 day supply? [place]

## 2018-06-06 NOTE — Telephone Encounter (Signed)
Medications was send into pt pharmacy on 05/13

## 2018-06-12 ENCOUNTER — Emergency Department (HOSPITAL_COMMUNITY)
Admission: EM | Admit: 2018-06-12 | Discharge: 2018-06-12 | Disposition: A | Discharge status: Home / Self Care | Payer: Medicaid Other | Attending: Emergency Medicine | Admitting: Emergency Medicine

## 2018-06-12 ENCOUNTER — Other Ambulatory Visit: Payer: Self-pay

## 2018-06-12 ENCOUNTER — Encounter (HOSPITAL_COMMUNITY): Payer: Self-pay

## 2018-06-12 DIAGNOSIS — R42 Dizziness and giddiness: Secondary | ICD-10-CM

## 2018-06-12 DIAGNOSIS — R51 Headache: Secondary | ICD-10-CM | POA: Diagnosis present

## 2018-06-12 DIAGNOSIS — I10 Essential (primary) hypertension: Secondary | ICD-10-CM | POA: Insufficient documentation

## 2018-06-12 DIAGNOSIS — Z79899 Other long term (current) drug therapy: Secondary | ICD-10-CM | POA: Insufficient documentation

## 2018-06-12 LAB — URINALYSIS, ROUTINE W REFLEX MICROSCOPIC
Bilirubin Urine: NEGATIVE
Glucose, UA: NEGATIVE mg/dL
Hgb urine dipstick: NEGATIVE
Ketones, ur: NEGATIVE mg/dL
Nitrite: NEGATIVE
Protein, ur: NEGATIVE mg/dL
Specific Gravity, Urine: 1.016 (ref 1.005–1.030)
pH: 6 (ref 5.0–8.0)

## 2018-06-12 LAB — CBC WITH DIFFERENTIAL/PLATELET
Abs Immature Granulocytes: 0.02 10*3/uL (ref 0.00–0.07)
Basophils Absolute: 0 10*3/uL (ref 0.0–0.1)
Basophils Relative: 0 %
Eosinophils Absolute: 0.3 10*3/uL (ref 0.0–0.5)
Eosinophils Relative: 4 %
HCT: 38.7 % — ABNORMAL LOW (ref 39.0–52.0)
Hemoglobin: 12.1 g/dL — ABNORMAL LOW (ref 13.0–17.0)
Immature Granulocytes: 0 %
Lymphocytes Relative: 21 %
Lymphs Abs: 1.4 10*3/uL (ref 0.7–4.0)
MCH: 29.6 pg (ref 26.0–34.0)
MCHC: 31.3 g/dL (ref 30.0–36.0)
MCV: 94.6 fL (ref 80.0–100.0)
Monocytes Absolute: 0.3 10*3/uL (ref 0.1–1.0)
Monocytes Relative: 5 %
Neutro Abs: 4.7 10*3/uL (ref 1.7–7.7)
Neutrophils Relative %: 70 %
Platelets: 173 10*3/uL (ref 150–400)
RBC: 4.09 MIL/uL — ABNORMAL LOW (ref 4.22–5.81)
RDW: 14.2 % (ref 11.5–15.5)
WBC: 6.8 10*3/uL (ref 4.0–10.5)
nRBC: 0 % (ref 0.0–0.2)

## 2018-06-12 LAB — BASIC METABOLIC PANEL
Anion gap: 6 (ref 5–15)
BUN: 17 mg/dL (ref 6–20)
CO2: 24 mmol/L (ref 22–32)
Calcium: 9.1 mg/dL (ref 8.9–10.3)
Chloride: 111 mmol/L (ref 98–111)
Creatinine, Ser: 1.29 mg/dL — ABNORMAL HIGH (ref 0.61–1.24)
GFR calc Af Amer: >60 mL/min (ref >60)
GFR calc non Af Amer: 60 mL/min — ABNORMAL LOW (ref >60)
Glucose, Bld: 115 mg/dL — ABNORMAL HIGH (ref 70–99)
Potassium: 4 mmol/L (ref 3.5–5.1)
Sodium: 141 mmol/L (ref 135–145)

## 2018-06-12 MED ORDER — MECLIZINE HCL 25 MG PO TABS
25.0000 mg | ORAL_TABLET | Freq: Once | ORAL | Status: AC
  Administered 2018-06-12: 25 mg via ORAL
  Filled 2018-06-12: qty 1

## 2018-06-12 MED ORDER — ONDANSETRON HCL 4 MG/2ML IJ SOLN
4.0000 mg | Freq: Once | INTRAMUSCULAR | Status: AC
  Administered 2018-06-12: 4 mg via INTRAVENOUS
  Filled 2018-06-12: qty 2

## 2018-06-12 MED ORDER — SODIUM CHLORIDE 0.9 % IV BOLUS
500.0000 mL | Freq: Once | INTRAVENOUS | Status: AC
  Administered 2018-06-12: 500 mL via INTRAVENOUS

## 2018-06-12 MED ORDER — ONDANSETRON HCL 4 MG PO TABS: 4.0000 mg | ORAL_TABLET | Freq: Four times a day (QID) | ORAL | 0 refills | Status: DC

## 2018-06-12 MED ORDER — MECLIZINE HCL 25 MG PO TABS: 25.0000 mg | ORAL_TABLET | Freq: Three times a day (TID) | ORAL | 0 refills | Status: DC | PRN

## 2018-06-12 NOTE — ED Notes (Signed)
EKG handed to [facility] MD.

## 2018-06-12 NOTE — ED Provider Notes (Signed)
[facility] DEPT Provider Note   CSN: [id number] Arrival date & time: 06/12/18  7341    History   Chief Complaint Chief Complaint  Patient presents with  . Nausea  . diaphoresis  . Headache    HPI Curtis Saunders is a 63 y.o. male.     63 year old male with prior medical history as detailed below presents for evaluation of dizziness.  Patient reports that he awoke this morning and then developed dizziness.  He reports he feels like the room was spinning.  He denies nausea or vomiting.  He denies associated chest pain or shortness of breath.  He denies fever.  He denies prior history of vertigo.  He denies other complaints such as visual change, speech change, focal weakness, or paresthesia.  The history is provided by the patient and medical records.  Dizziness  Quality:  Head spinning and vertigo Severity:  Mild Onset quality:  Sudden Duration:  4 hours Timing:  Constant Progression:  Waxing and waning Chronicity:  New Relieved by:  Nothing Worsened by:  Nothing Ineffective treatments:  None tried   Past Medical History:  Diagnosis Date  . Hypertension     Patient Active Problem List   Diagnosis Date Noted  . LVH (left ventricular hypertrophy) 03/06/2018  . Essential hypertension 03/06/2018  . HTN (hypertension), malignant 02/20/2018  . Chest pain 02/20/2018    Past Surgical History:  Procedure Laterality Date  . APPENDECTOMY    . EYE SURGERY    . HAND SURGERY    . KNEE SURGERY     x3, x1 R  . TONSILLECTOMY          Home Medications    Prior to Admission medications   Medication Sig Start Date End Date Taking? Authorizing Provider  amLODipine (NORVASC) 10 MG tablet Take 1 tablet (10 mg total) by mouth daily. 03/06/18   Minus Breeding, MD  losartan (COZAAR) 50 MG tablet Take 1 tablet (50 mg total) by mouth daily. 03/19/18 06/17/18  Minus Breeding, MD  sildenafil (VIAGRA) 100 MG tablet TAKE 1 TABLET BY MOUTH ONCE DAILY  AS NEEDED FOR ERECTILE DYSFUNCTION 06/05/18   Minus Breeding, MD    Family History Family History  Problem Relation Age of Onset  . Heart attack Mother 31  . COPD Mother   . Emphysema Mother   . Hypertension Sister     Social History Social History   Tobacco Use  . Smoking status: Never Smoker  . Smokeless tobacco: Never Used  Substance Use Topics  . Alcohol use: Not Currently    Comment: socially  . Drug use: No     Allergies   Bee venom   Review of Systems Review of Systems  Neurological: Positive for dizziness.  All other systems reviewed and are negative.    Physical Exam Updated Vital Signs BP (!) 197/119   Pulse (!) 58   Temp 97.9 F (36.6 C) (Oral)   Resp 16   Ht 5\' 11"  (1.803 m)   SpO2 100%   BMI 30.13 kg/m   Physical Exam Vitals signs and nursing note reviewed.  Constitutional:      General: He is not in acute distress.    Appearance: He is well-developed.  HENT:     Head: Normocephalic and atraumatic.  Eyes:     General: No visual field deficit.    Conjunctiva/sclera: Conjunctivae normal.     Pupils: Pupils are equal, round, and reactive to light.  Neck:  Musculoskeletal: Normal range of motion and neck supple.  Cardiovascular:     Rate and Rhythm: Normal rate and regular rhythm.     Heart sounds: Normal heart sounds.  Pulmonary:     Effort: Pulmonary effort is normal. No respiratory distress.     Breath sounds: Normal breath sounds.  Abdominal:     General: There is no distension.     Palpations: Abdomen is soft.     Tenderness: There is no abdominal tenderness.  Musculoskeletal: Normal range of motion.        General: No deformity.  Skin:    General: Skin is warm and dry.  Neurological:     Mental Status: He is alert and oriented to person, place, and time.     GCS: GCS eye subscore is 4. GCS verbal subscore is 5. GCS motor subscore is 6.     Cranial Nerves: No cranial nerve deficit, dysarthria or facial asymmetry.      Sensory: No sensory deficit.     Motor: No weakness.     Coordination: Romberg sign negative. Coordination normal.      ED Treatments / Results  Labs (all labs ordered are listed, but only abnormal results are displayed) Labs Reviewed  BASIC METABOLIC PANEL - Abnormal; Notable for the following components:      Result Value   Glucose, Bld 115 (*)    Creatinine, Ser 1.29 (*)    GFR calc non Af Amer 60 (*)    All other components within normal limits  CBC WITH DIFFERENTIAL/PLATELET - Abnormal; Notable for the following components:   RBC 4.09 (*)    Hemoglobin 12.1 (*)    HCT 38.7 (*)    All other components within normal limits  URINALYSIS, ROUTINE W REFLEX MICROSCOPIC    EKG EKG Interpretation  Date/Time:  Wednesday Jun 12 2018 11:07:44 EDT Ventricular Rate:  54 PR Interval:    QRS Duration: 110 QT Interval:  440 QTC Calculation: 417 R Axis:   74 Text Interpretation:  Sinus rhythm Abnormal T, consider ischemia, lateral leads ST elevation, consider anterolateral injury Baseline wander in lead(s) I III aVL Confirmed by Dene Gentry [phone]) on 06/12/2018 11:12:42 AM   Radiology No results found.  Procedures Procedures (including critical care time)  Medications Ordered in ED Medications  sodium chloride 0.9 % bolus 500 mL (500 mLs Intravenous New Bag/Given 06/12/18 1059)  meclizine (ANTIVERT) tablet 25 mg (25 mg Oral Given 06/12/18 1059)  ondansetron (ZOFRAN) injection 4 mg (4 mg Intravenous Given 06/12/18 1100)     Initial Impression / Assessment and Plan / ED Course  I have reviewed the triage vital signs and the nursing notes.  Pertinent labs & imaging results that were available during my care of the patient were reviewed by me and considered in my medical decision making (see chart for details).        MDM  Screen complete  Curtis Saunders was evaluated in Emergency Department on 06/12/2018 for the symptoms described in the history of present illness.  He was evaluated in the context of the global COVID-19 pandemic, which necessitated consideration that the patient might be at risk for infection with the SARS-CoV-2 virus that causes COVID-19. Institutional protocols and algorithms that pertain to the evaluation of patients at risk for COVID-19 are in a state of rapid change based on information released by regulatory bodies including the CDC and federal and state organizations. These policies and algorithms were followed during the patient's care in  the ED.  Patient is presenting with symptoms most consistent with benign vertigo.  Screening exam and labs in the ED do not suggest other significant pathology.  She does feel improved following treatment with meclizine.  He now desires discharge home.  Importance of close follow-up is stressed.  Strict return precautions given and understood.     Final Clinical Impressions(s) / ED Diagnoses   Final diagnoses:  Vertigo    ED Discharge Orders         Ordered    ondansetron (ZOFRAN) 4 MG tablet  Every 6 hours     06/12/18 1300    meclizine (ANTIVERT) 25 MG tablet  3 times daily PRN     06/12/18 1300           Valarie Merino, MD 06/12/18 1307

## 2018-06-12 NOTE — ED Triage Notes (Signed)
Patient c/o headache, nausea, and diaphoresis since waking this AM.

## 2018-06-12 NOTE — Discharge Instructions (Signed)
Please return for any problem.  Follow-up with your regular care provider as instructed. °

## 2018-07-12 DIAGNOSIS — H11031 Double pterygium of right eye: Secondary | ICD-10-CM | POA: Insufficient documentation

## 2018-08-07 ENCOUNTER — Telehealth: Payer: Self-pay | Admitting: Cardiology

## 2018-08-07 MED ORDER — RAMIPRIL 10 MG PO CAPS: 10.0000 mg | ORAL_CAPSULE | Freq: Every day | ORAL | 3 refills | Status: DC

## 2018-08-07 NOTE — Telephone Encounter (Signed)
Error wrong doctor's office

## 2018-08-07 NOTE — Telephone Encounter (Signed)
Spoke to patient  Patient states he has only 2 pills left of Ramipril 10 mg left.  per last office visit  03/14/18 with  [facility]  OFFICE VISIT   RAMIPRIL WAS STARTED  ALONG WITH CONTINUING AMLODIPINE 10 MG.  PATIENT STARTS HE IS NO LONGER TAKING LOSARTAN.    WILL REMOVE FROM  MEDICATION LIST   PATIENT STATE HE HAS BEEN TAKING RAMIPRIL  INSTEAD OF LOSARTAN AS  CHANGED 03/26/18.    NEW PRESCRIPTION WAS E-SENT TO PHARMACY AS PATIENT REQEUSTED

## 2018-12-23 ENCOUNTER — Emergency Department (HOSPITAL_COMMUNITY)
Admission: EM | Admit: 2018-12-23 | Discharge: 2018-12-23 | Disposition: A | Discharge status: Home / Self Care | Payer: Medicaid Other | Attending: Emergency Medicine | Admitting: Emergency Medicine

## 2018-12-23 ENCOUNTER — Encounter (HOSPITAL_COMMUNITY): Payer: Self-pay | Admitting: Emergency Medicine

## 2018-12-23 ENCOUNTER — Other Ambulatory Visit: Payer: Self-pay

## 2018-12-23 DIAGNOSIS — L03314 Cellulitis of groin: Secondary | ICD-10-CM | POA: Diagnosis not present

## 2018-12-23 DIAGNOSIS — R1909 Other intra-abdominal and pelvic swelling, mass and lump: Secondary | ICD-10-CM | POA: Diagnosis present

## 2018-12-23 DIAGNOSIS — Z79899 Other long term (current) drug therapy: Secondary | ICD-10-CM | POA: Diagnosis not present

## 2018-12-23 DIAGNOSIS — I10 Essential (primary) hypertension: Secondary | ICD-10-CM | POA: Insufficient documentation

## 2018-12-23 MED ORDER — DOXYCYCLINE HYCLATE 100 MG PO CAPS: 100.0000 mg | ORAL_CAPSULE | Freq: Two times a day (BID) | ORAL | 0 refills | Status: DC

## 2018-12-23 NOTE — ED Triage Notes (Signed)
Pt verbalizes left groin pain for past week; denies pain going into testicles or penis; pt verbalizes "I think a hair got twisted up."

## 2018-12-23 NOTE — Discharge Instructions (Addendum)
If you develop fever, new or worsening pain or swelling, or any other new/concerning symptoms then return to the ER for evaluation.  Your blood pressure is elevated today.  You need to get this rechecked at your primary care physician's office.

## 2018-12-23 NOTE — ED Notes (Signed)
Pt verbalizes "I need to go; a doctor hasn't even come in here." Pt made aware of wait time and that someone would be there shortly. Pt agrees to wait " a little longer." Pt denies need a present. Call light within reach.

## 2018-12-23 NOTE — ED Provider Notes (Signed)
[facility] DEPT Provider Note   CSN: [id number] Arrival date & time: 12/23/18  0744     History   Chief Complaint Chief Complaint  Patient presents with  . Abscess    HPI Curtis Saunders is a 63 y.o. male.     HPI  63 year old male presents with concern for infection from an ingrown hair. For about a week has had painful swelling in left groin. No drainage. No fevers, vomiting.   Past Medical History:  Diagnosis Date  . Hypertension     Patient Active Problem List   Diagnosis Date Noted  . LVH (left ventricular hypertrophy) 03/06/2018  . Essential hypertension 03/06/2018  . HTN (hypertension), malignant 02/20/2018  . Chest pain 02/20/2018    Past Surgical History:  Procedure Laterality Date  . APPENDECTOMY    . EYE SURGERY    . HAND SURGERY    . KNEE SURGERY     x3, x1 R  . TONSILLECTOMY          Home Medications    Prior to Admission medications   Medication Sig Start Date End Date Taking? Authorizing Provider  amLODipine (NORVASC) 10 MG tablet Take 1 tablet (10 mg total) by mouth daily. 03/06/18   Minus Breeding, MD  meclizine (ANTIVERT) 25 MG tablet Take 1 tablet (25 mg total) by mouth 3 (three) times daily as needed for dizziness. 06/12/18   Valarie Merino, MD  ondansetron (ZOFRAN) 4 MG tablet Take 1 tablet (4 mg total) by mouth every 6 (six) hours. 06/12/18   Valarie Merino, MD  ramipril (ALTACE) 10 MG capsule Take 1 capsule (10 mg total) by mouth daily. 08/07/18 11/05/18  Minus Breeding, MD  sildenafil (VIAGRA) 100 MG tablet TAKE 1 TABLET BY MOUTH ONCE DAILY AS NEEDED FOR ERECTILE DYSFUNCTION Patient taking differently: Take 100 mg by mouth as needed for erectile dysfunction.  06/05/18   Minus Breeding, MD    Family History Family History  Problem Relation Age of Onset  . Heart attack Mother 53  . COPD Mother   . Emphysema Mother   . Hypertension Sister     Social History Social History   Tobacco Use  .  Smoking status: Never Smoker  . Smokeless tobacco: Never Used  Substance Use Topics  . Alcohol use: Not Currently    Comment: socially  . Drug use: No     Allergies   Bee venom   Review of Systems Review of Systems  Constitutional: Negative for fever.  Gastrointestinal: Negative for vomiting.  Skin: Negative for wound.     Physical Exam Updated Vital Signs BP (!) 192/117 (BP Location: Left Arm) Comment: pt has not taken BP meds for a week  Pulse 73   Temp 98.7 F (37.1 C) (Oral)   Resp 16   SpO2 97%   Physical Exam Vitals signs and nursing note reviewed.  Constitutional:      Appearance: He is well-developed.  HENT:     Head: Normocephalic and atraumatic.     Right Ear: External ear normal.     Left Ear: External ear normal.     Nose: Nose normal.  Eyes:     General:        Right eye: No discharge.        Left eye: No discharge.  Neck:     Musculoskeletal: Neck supple.  Pulmonary:     Effort: Pulmonary effort is normal.  Abdominal:  General: There is no distension.     Palpations: Abdomen is soft.     Tenderness: There is no abdominal tenderness.  Genitourinary:      Comments: No testicle/scrotum tenderness or swelling. No penile swelling/tenderness Skin:    General: Skin is warm and dry.     Findings: No erythema.  Neurological:     Mental Status: He is alert.  Psychiatric:        Mood and Affect: Mood is not anxious.      ED Treatments / Results  Labs (all labs ordered are listed, but only abnormal results are displayed) Labs Reviewed - No data to display  EKG None  Radiology No results found.  Procedures Ultrasound ED Soft Tissue  Date/Time: 12/23/2018 9:49 AM Performed by: Sherwood Gambler, MD Authorized by: Sherwood Gambler, MD   Procedure details:    Indications: localization of abscess and evaluate for cellulitis     Transverse view:  Visualized   Longitudinal view:  Visualized   Images: archived   Location:     Location: groin     Side:  Left Findings:     no abscess present    cellulitis present Comments:     No obvious abscess   (including critical care time)  Medications Ordered in ED Medications - No data to display   Initial Impression / Assessment and Plan / ED Course  I have reviewed the triage vital signs and the nursing notes.  Pertinent labs & imaging results that were available during my care of the patient were reviewed by me and considered in my medical decision making (see chart for details).        Patient has firm swelling to his left groin.  No obvious hernia.  I did a bedside ultrasound which does not show an obvious drainable abscess.  Given the tenderness and firmness with mild erythema, will treat as cellulitis with antibiotics.  He is noted to be hypertensive and while this did improve on recheck he will need to follow-up with his PCP for further outpatient management of his hypertension.  We discussed return precautions.  Final Clinical Impressions(s) / ED Diagnoses   Final diagnoses:  None    ED Discharge Orders    None       Sherwood Gambler, MD 12/23/18 [phone]

## 2019-02-20 ENCOUNTER — Emergency Department (HOSPITAL_COMMUNITY)
Admission: EM | Admit: 2019-02-20 | Discharge: 2019-02-20 | Disposition: A | Discharge status: Home / Self Care | Payer: Medicaid Other | Attending: Emergency Medicine | Admitting: Emergency Medicine

## 2019-02-20 ENCOUNTER — Emergency Department (HOSPITAL_COMMUNITY): Payer: Medicaid Other

## 2019-02-20 ENCOUNTER — Encounter (HOSPITAL_COMMUNITY): Payer: Self-pay

## 2019-02-20 DIAGNOSIS — S199XXA Unspecified injury of neck, initial encounter: Secondary | ICD-10-CM | POA: Diagnosis present

## 2019-02-20 DIAGNOSIS — Y999 Unspecified external cause status: Secondary | ICD-10-CM | POA: Insufficient documentation

## 2019-02-20 DIAGNOSIS — Y939 Activity, unspecified: Secondary | ICD-10-CM | POA: Insufficient documentation

## 2019-02-20 DIAGNOSIS — Y92410 Unspecified street and highway as the place of occurrence of the external cause: Secondary | ICD-10-CM | POA: Diagnosis not present

## 2019-02-20 DIAGNOSIS — Z79899 Other long term (current) drug therapy: Secondary | ICD-10-CM | POA: Insufficient documentation

## 2019-02-20 DIAGNOSIS — I10 Essential (primary) hypertension: Secondary | ICD-10-CM | POA: Insufficient documentation

## 2019-02-20 DIAGNOSIS — S161XXA Strain of muscle, fascia and tendon at neck level, initial encounter: Secondary | ICD-10-CM | POA: Diagnosis not present

## 2019-02-20 DIAGNOSIS — S0990XA Unspecified injury of head, initial encounter: Secondary | ICD-10-CM | POA: Diagnosis not present

## 2019-02-20 MED ORDER — HYDROCODONE-ACETAMINOPHEN 5-325 MG PO TABS
1.0000 | ORAL_TABLET | Freq: Once | ORAL | Status: AC
  Administered 2019-02-20: 1 via ORAL
  Filled 2019-02-20: qty 1

## 2019-02-20 MED ORDER — TRAMADOL HCL 50 MG PO TABS: 50.0000 mg | ORAL_TABLET | Freq: Four times a day (QID) | ORAL | 0 refills | Status: DC | PRN
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
## 2019-02-20 NOTE — Discharge Instructions (Addendum)
Return here as needed.  Your CT scans did not show any significant abnormality at this time.  Use ice and heat on the areas that are sore.
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
## 2019-02-22 ENCOUNTER — Encounter (HOSPITAL_COMMUNITY): Payer: Self-pay | Admitting: Emergency Medicine

## 2019-02-22 ENCOUNTER — Emergency Department (HOSPITAL_COMMUNITY)
Admission: EM | Admit: 2019-02-22 | Discharge: 2019-02-22 | Disposition: A | Discharge status: Home / Self Care | Payer: Medicaid Other | Attending: Emergency Medicine | Admitting: Emergency Medicine

## 2019-02-22 DIAGNOSIS — Y92410 Unspecified street and highway as the place of occurrence of the external cause: Secondary | ICD-10-CM | POA: Diagnosis not present

## 2019-02-22 DIAGNOSIS — S0993XA Unspecified injury of face, initial encounter: Secondary | ICD-10-CM | POA: Diagnosis present

## 2019-02-22 DIAGNOSIS — I10 Essential (primary) hypertension: Secondary | ICD-10-CM

## 2019-02-22 DIAGNOSIS — Y939 Activity, unspecified: Secondary | ICD-10-CM | POA: Diagnosis not present

## 2019-02-22 DIAGNOSIS — S0083XD Contusion of other part of head, subsequent encounter: Secondary | ICD-10-CM

## 2019-02-22 DIAGNOSIS — R519 Headache, unspecified: Secondary | ICD-10-CM | POA: Diagnosis not present

## 2019-02-22 DIAGNOSIS — Y999 Unspecified external cause status: Secondary | ICD-10-CM | POA: Insufficient documentation

## 2019-02-22 DIAGNOSIS — Z79899 Other long term (current) drug therapy: Secondary | ICD-10-CM | POA: Diagnosis not present

## 2019-02-22 DIAGNOSIS — S0083XA Contusion of other part of head, initial encounter: Secondary | ICD-10-CM | POA: Diagnosis not present

## 2019-02-22 LAB — COMPREHENSIVE METABOLIC PANEL
ALT: 21 U/L (ref 0–44)
AST: 20 U/L (ref 15–41)
Albumin: 3.9 g/dL (ref 3.5–5.0)
Alkaline Phosphatase: 73 U/L (ref 38–126)
Anion gap: 10 (ref 5–15)
BUN: 15 mg/dL (ref 8–23)
CO2: 26 mmol/L (ref 22–32)
Calcium: 9.3 mg/dL (ref 8.9–10.3)
Chloride: 107 mmol/L (ref 98–111)
Creatinine, Ser: 1.39 mg/dL — ABNORMAL HIGH (ref 0.61–1.24)
GFR calc Af Amer: >60 mL/min (ref >60)
GFR calc non Af Amer: 54 mL/min — ABNORMAL LOW (ref >60)
Glucose, Bld: 82 mg/dL (ref 70–99)
Potassium: 3.7 mmol/L (ref 3.5–5.1)
Sodium: 143 mmol/L (ref 135–145)
Total Bilirubin: 0.9 mg/dL (ref 0.3–1.2)
Total Protein: 7 g/dL (ref 6.5–8.1)

## 2019-02-22 LAB — CBC WITH DIFFERENTIAL/PLATELET
Abs Immature Granulocytes: 0.02 10*3/uL (ref 0.00–0.07)
Basophils Absolute: 0 10*3/uL (ref 0.0–0.1)
Basophils Relative: 0 %
Eosinophils Absolute: 0.3 10*3/uL (ref 0.0–0.5)
Eosinophils Relative: 4 %
HCT: 40.4 % (ref 39.0–52.0)
Hemoglobin: 12.9 g/dL — ABNORMAL LOW (ref 13.0–17.0)
Immature Granulocytes: 0 %
Lymphocytes Relative: 31 %
Lymphs Abs: 2.3 10*3/uL (ref 0.7–4.0)
MCH: 29.3 pg (ref 26.0–34.0)
MCHC: 31.9 g/dL (ref 30.0–36.0)
MCV: 91.6 fL (ref 80.0–100.0)
Monocytes Absolute: 0.5 10*3/uL (ref 0.1–1.0)
Monocytes Relative: 7 %
Neutro Abs: 4.2 10*3/uL (ref 1.7–7.7)
Neutrophils Relative %: 58 %
Platelets: 175 10*3/uL (ref 150–400)
RBC: 4.41 MIL/uL (ref 4.22–5.81)
RDW: 13.7 % (ref 11.5–15.5)
WBC: 7.4 10*3/uL (ref 4.0–10.5)
nRBC: 0 % (ref 0.0–0.2)

## 2019-02-22 MED ORDER — HYDRALAZINE HCL 10 MG PO TABS
10.0000 mg | ORAL_TABLET | Freq: Once | ORAL | Status: AC
  Administered 2019-02-22: 10 mg via ORAL
  Filled 2019-02-22: qty 1

## 2019-02-22 MED ORDER — OXYCODONE-ACETAMINOPHEN 5-325 MG PO TABS
1.0000 | ORAL_TABLET | Freq: Once | ORAL | Status: AC
  Administered 2019-02-22: 1 via ORAL
  Filled 2019-02-22: qty 1
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
## 2019-02-22 NOTE — Discharge Instructions (Addendum)
Follow-up with your primary care doctor as soon as possible for reevaluation of your blood pressure and titration of your blood pressure medications.  Your blood pressure was extremely elevated today in the ED.  If you have any new or concerning symptoms please return to ED for evaluation.

## 2019-05-31 ENCOUNTER — Emergency Department (HOSPITAL_COMMUNITY)
Admission: EM | Admit: 2019-05-31 | Discharge: 2019-05-31 | Disposition: A | Discharge status: Home / Self Care | Payer: Medicaid Other | Attending: Emergency Medicine | Admitting: Emergency Medicine

## 2019-05-31 ENCOUNTER — Other Ambulatory Visit: Payer: Self-pay

## 2019-05-31 ENCOUNTER — Encounter (HOSPITAL_COMMUNITY): Payer: Self-pay | Admitting: Emergency Medicine

## 2019-05-31 DIAGNOSIS — R109 Unspecified abdominal pain: Secondary | ICD-10-CM | POA: Diagnosis not present

## 2019-05-31 DIAGNOSIS — R300 Dysuria: Secondary | ICD-10-CM | POA: Diagnosis not present

## 2019-05-31 DIAGNOSIS — I10 Essential (primary) hypertension: Secondary | ICD-10-CM | POA: Diagnosis not present

## 2019-05-31 DIAGNOSIS — I160 Hypertensive urgency: Secondary | ICD-10-CM

## 2019-05-31 DIAGNOSIS — Z79899 Other long term (current) drug therapy: Secondary | ICD-10-CM | POA: Diagnosis not present

## 2019-05-31 LAB — URINALYSIS, ROUTINE W REFLEX MICROSCOPIC
Bilirubin Urine: NEGATIVE
Glucose, UA: NEGATIVE mg/dL
Hgb urine dipstick: NEGATIVE
Ketones, ur: NEGATIVE mg/dL
Leukocytes,Ua: NEGATIVE
Nitrite: NEGATIVE
Protein, ur: NEGATIVE mg/dL
Specific Gravity, Urine: 1.018 (ref 1.005–1.030)
pH: 6 (ref 5.0–8.0)

## 2019-05-31 LAB — I-STAT CHEM 8, ED
BUN: 12 mg/dL (ref 8–23)
Calcium, Ion: 1.33 mmol/L (ref 1.15–1.40)
Chloride: 105 mmol/L (ref 98–111)
Creatinine, Ser: 1.3 mg/dL — ABNORMAL HIGH (ref 0.61–1.24)
Glucose, Bld: 87 mg/dL (ref 70–99)
HCT: 43 % (ref 39.0–52.0)
Hemoglobin: 14.6 g/dL (ref 13.0–17.0)
Potassium: 3.8 mmol/L (ref 3.5–5.1)
Sodium: 144 mmol/L (ref 135–145)
TCO2: 27 mmol/L (ref 22–32)

## 2019-05-31 MED ORDER — STERILE WATER FOR INJECTION IJ SOLN
INTRAMUSCULAR | Status: AC
  Administered 2019-05-31: 2.1 mL
  Filled 2019-05-31: qty 10

## 2019-05-31 MED ORDER — RAMIPRIL 10 MG PO CAPS
10.0000 mg | ORAL_CAPSULE | Freq: Every day | ORAL | Status: DC
  Administered 2019-05-31: 10 mg via ORAL
  Filled 2019-05-31: qty 1

## 2019-05-31 MED ORDER — AZITHROMYCIN 250 MG PO TABS
1000.0000 mg | ORAL_TABLET | Freq: Once | ORAL | Status: AC
  Administered 2019-05-31: 1000 mg via ORAL
  Filled 2019-05-31 (×2): qty 4

## 2019-05-31 MED ORDER — CEFTRIAXONE SODIUM 1 G IJ SOLR
500.0000 mg | Freq: Once | INTRAMUSCULAR | Status: AC
  Administered 2019-05-31: 500 mg via INTRAMUSCULAR
  Filled 2019-05-31: qty 10

## 2019-05-31 MED ORDER — AMLODIPINE BESYLATE 5 MG PO TABS
10.0000 mg | ORAL_TABLET | Freq: Every day | ORAL | Status: DC
  Administered 2019-05-31: 10 mg via ORAL
  Filled 2019-05-31: qty 2
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
## 2019-05-31 NOTE — ED Notes (Signed)
Pt ambulatory to bathroom, no assistance needed.  

## 2019-05-31 NOTE — Discharge Instructions (Addendum)
As discussed, your evaluation today has been largely reassuring.  But, it is important that you monitor your condition carefully, and do not hesitate to return to the ED if you develop new, or concerning changes in your condition. ? ?Otherwise, please follow-up with your physician for appropriate ongoing care. ? ?

## 2019-05-31 NOTE — ED Triage Notes (Signed)
Per pt, states left groin pain since Monday-no heavy lifting, no history of hernia

## 2019-05-31 NOTE — ED Notes (Signed)
D/c paperwork reviewed with pt.  Pt with no questions or concerns at this time, ambulatory at time of discharge.

## 2019-07-28 ENCOUNTER — Emergency Department (HOSPITAL_COMMUNITY): Payer: Medicaid Other

## 2019-07-28 ENCOUNTER — Encounter (HOSPITAL_COMMUNITY): Payer: Self-pay

## 2019-07-28 ENCOUNTER — Other Ambulatory Visit: Payer: Self-pay

## 2019-07-28 ENCOUNTER — Emergency Department (HOSPITAL_COMMUNITY)
Admission: EM | Admit: 2019-07-28 | Discharge: 2019-07-28 | Disposition: A | Discharge status: Home / Self Care | Payer: Medicaid Other | Attending: Emergency Medicine | Admitting: Emergency Medicine

## 2019-07-28 DIAGNOSIS — Z79899 Other long term (current) drug therapy: Secondary | ICD-10-CM | POA: Insufficient documentation

## 2019-07-28 DIAGNOSIS — R222 Localized swelling, mass and lump, trunk: Secondary | ICD-10-CM | POA: Diagnosis not present

## 2019-07-28 DIAGNOSIS — R2232 Localized swelling, mass and lump, left upper limb: Secondary | ICD-10-CM | POA: Diagnosis not present

## 2019-07-28 DIAGNOSIS — I10 Essential (primary) hypertension: Secondary | ICD-10-CM | POA: Diagnosis not present

## 2019-07-28 DIAGNOSIS — R2230 Localized swelling, mass and lump, unspecified upper limb: Secondary | ICD-10-CM

## 2019-07-28 NOTE — ED Provider Notes (Signed)
[facility] DEPT Provider Note   CSN: [id number] Arrival date & time: 07/28/19  1111     History Chief Complaint  Patient presents with  . swollen area on shoulder  . Hypertension    Curtis Saunders is a 64 y.o. male.  HPI   63yM with left shoulder mass.  It has been there for "a minute."  When asked to further specify it sounds like it has been there for least a year.  Slowly larger in size.  Is now become to the point where it is causing him discomfort particular on the left side of his neck.  Worse with certain movements.   Has never been evaluated for this before.  Has seen orthopedics for other various issues though.  Past Medical History:  Diagnosis Date  . Hypertension     Patient Active Problem List   Diagnosis Date Noted  . LVH (left ventricular hypertrophy) 03/06/2018  . Essential hypertension 03/06/2018  . HTN (hypertension), malignant 02/20/2018  . Chest pain 02/20/2018    Past Surgical History:  Procedure Laterality Date  . APPENDECTOMY    . EYE SURGERY    . HAND SURGERY    . KNEE SURGERY     x3, x1 R  . TONSILLECTOMY         Family History  Problem Relation Age of Onset  . Heart attack Mother 33  . COPD Mother   . Emphysema Mother   . Hypertension Sister     Social History   Tobacco Use  . Smoking status: Never Smoker  . Smokeless tobacco: Never Used  Vaping Use  . Vaping Use: Never used  Substance Use Topics  . Alcohol use: Not Currently    Comment: socially  . Drug use: No    Home Medications Prior to Admission medications   Medication Sig Start Date End Date Taking? Authorizing Provider  amLODipine (NORVASC) 10 MG tablet Take 1 tablet (10 mg total) by mouth daily. 03/06/18   Minus Breeding, MD  doxycycline (VIBRAMYCIN) 100 MG capsule Take 1 capsule (100 mg total) by mouth 2 (two) times daily. One po bid x 7 days Patient not taking: Reported on 05/31/2019 12/23/18   Sherwood Gambler, MD  meclizine  (ANTIVERT) 25 MG tablet Take 1 tablet (25 mg total) by mouth 3 (three) times daily as needed for dizziness. Patient not taking: Reported on 05/31/2019 06/12/18   Valarie Merino, MD  ondansetron (ZOFRAN) 4 MG tablet Take 1 tablet (4 mg total) by mouth every 6 (six) hours. Patient not taking: Reported on 05/31/2019 06/12/18   Valarie Merino, MD  ramipril (ALTACE) 10 MG capsule Take 1 capsule (10 mg total) by mouth daily. 08/07/18 05/31/19  Minus Breeding, MD  sildenafil (VIAGRA) 100 MG tablet TAKE 1 TABLET BY MOUTH ONCE DAILY AS NEEDED FOR ERECTILE DYSFUNCTION Patient not taking: No sig reported 06/05/18   Minus Breeding, MD  traMADol (ULTRAM) 50 MG tablet Take 1 tablet (50 mg total) by mouth every 6 (six) hours as needed for severe pain. Patient not taking: Reported on 05/31/2019 02/20/19   Dalia Heading, PA-C    Allergies    Bee venom  Review of Systems   Review of Systems All systems reviewed and negative, other than as noted in HPI.  Physical Exam Updated Vital Signs BP (!) 230/132 (BP Location: Right Arm)   Pulse 60   Temp 98.2 F (36.8 C) (Oral)   Resp 15   Ht 5\' 11"  (1.803  m)   Wt 132 kg   SpO2 95%   BMI 40.59 kg/m   Physical Exam Vitals and nursing note reviewed.  Constitutional:      General: He is not in acute distress.    Appearance: He is well-developed.  HENT:     Head: Normocephalic and atraumatic.  Eyes:     General:        Right eye: No discharge.        Left eye: No discharge.     Conjunctiva/sclera: Conjunctivae normal.  Cardiovascular:     Rate and Rhythm: Normal rate and regular rhythm.     Heart sounds: Normal heart sounds. No murmur heard.  No friction rub. No gallop.   Pulmonary:     Effort: Pulmonary effort is normal. No respiratory distress.     Breath sounds: Normal breath sounds.  Abdominal:     General: There is no distension.     Palpations: Abdomen is soft.     Tenderness: There is no abdominal tenderness.  Musculoskeletal:         General: No tenderness.     Cervical back: Neck supple.     Comments: Large mass l anterior shoulder as pictured. Nontender. Solid on bedside ultrasound. Can actively range his shoulder and doesn't really have much pain but feels like there is pulling on the L side of his neck.   Skin:    General: Skin is warm and dry.  Neurological:     Mental Status: He is alert.  Psychiatric:        Behavior: Behavior normal.        Thought Content: Thought content normal.       ED Results / Procedures / Treatments   Labs (all labs ordered are listed, but only abnormal results are displayed) Labs Reviewed - No data to display  EKG None  Radiology DG Shoulder Left  Result Date: 07/28/2019 CLINICAL DATA:  Left shoulder pain and swelling 1 month. EXAM: LEFT SHOULDER - 2+ VIEW COMPARISON:  04/26/2011 FINDINGS: Mild degenerate change of the [facility] joint and glenohumeral joints. No evidence of acute fracture or dislocation. IMPRESSION: No acute findings. Electronically Signed   By: Marin Olp M.D.   On: 07/28/2019 13:18    Procedures Procedures (including critical care time)  Medications Ordered in ED Medications - No data to display  ED Course  I have reviewed the triage vital signs and the nursing notes.  Pertinent labs & imaging results that were available during my care of the patient were reviewed by me and considered in my medical decision making (see chart for details).    MDM Rules/Calculators/A&P                          63yM with large mass L anterior shoulder. Lipoma?  He has another smaller mass in his upper back that is similar in consistency on palpation and appearance on ultrasound. This has been present for a year+. I think orthopedic evaluation is next reasonable step for their opinion although I suspect the pain he is having in his neck and shoulder is from mass effect from the size of this mass as opposed to direct involvement of these areas.   HTN noted. Pt aware.  Unrelated to presenting complaint. PCP follow-up.   Final Clinical Impression(s) / ED Diagnoses Final diagnoses:  Shoulder mass  Mass of subcutaneous tissue of back  Hypertension, unspecified type    Rx /  DC Orders ED Discharge Orders    None       Virgel Manifold, MD 07/29/19 [phone]

## 2019-07-28 NOTE — ED Triage Notes (Addendum)
Patient reports a swollen area on his left shoulder that is painful with movement x 1 month.  BP in triage 236/139. Patient states he is compliant with BP med/Lisinopril.

## 2019-08-08 ENCOUNTER — Encounter (HOSPITAL_COMMUNITY): Payer: Self-pay

## 2019-08-08 ENCOUNTER — Ambulatory Visit (HOSPITAL_COMMUNITY)
Admission: EM | Admit: 2019-08-08 | Discharge: 2019-08-08 | Disposition: A | Discharge status: Home / Self Care | Payer: Medicaid Other | Attending: Family Medicine | Admitting: Family Medicine

## 2019-08-08 ENCOUNTER — Other Ambulatory Visit: Payer: Self-pay

## 2019-08-08 DIAGNOSIS — R300 Dysuria: Secondary | ICD-10-CM | POA: Diagnosis not present

## 2019-08-08 DIAGNOSIS — I10 Essential (primary) hypertension: Secondary | ICD-10-CM

## 2019-08-08 DIAGNOSIS — R1030 Lower abdominal pain, unspecified: Secondary | ICD-10-CM

## 2019-08-08 LAB — POCT URINALYSIS DIP (DEVICE)
Bilirubin Urine: NEGATIVE
Glucose, UA: NEGATIVE mg/dL
Hgb urine dipstick: NEGATIVE
Ketones, ur: NEGATIVE mg/dL
Nitrite: NEGATIVE
Protein, ur: NEGATIVE mg/dL
Specific Gravity, Urine: 1.025 (ref 1.005–1.030)
Urobilinogen, UA: 0.2 mg/dL (ref 0.0–1.0)
pH: 7 (ref 5.0–8.0)

## 2019-08-08 NOTE — ED Provider Notes (Signed)
[facility]    CSN: [id number] Arrival date & time: 08/08/19  5681      History   Chief Complaint Chief Complaint  Patient presents with  . Groin Pain    HPI Curtis Saunders is a 64 y.o. male.   Pt is a 64 year old male presents today with dysuria.  Symptoms have been constant x1 week.  Reporting some intermittent groin pain, urinary frequency.  Denies any penile discharge, testicle pain or swelling.  Denies any rashes.  There is some concern for STDs.  Patient very hypertensive today.  Reporting he did take his blood pressure medicine.  No associated headache, dizziness, vision changes or slurred speech.  ROS per HPI      Past Medical History:  Diagnosis Date  . Hypertension     Patient Active Problem List   Diagnosis Date Noted  . LVH (left ventricular hypertrophy) 03/06/2018  . Essential hypertension 03/06/2018  . HTN (hypertension), malignant 02/20/2018  . Chest pain 02/20/2018    Past Surgical History:  Procedure Laterality Date  . APPENDECTOMY    . EYE SURGERY    . HAND SURGERY    . KNEE SURGERY     x3, x1 R  . TONSILLECTOMY         Home Medications    Prior to Admission medications   Medication Sig Start Date End Date Taking? Authorizing Provider  amLODipine (NORVASC) 10 MG tablet Take 1 tablet (10 mg total) by mouth daily. 03/06/18   Minus Breeding, MD  ramipril (ALTACE) 10 MG capsule Take 1 capsule (10 mg total) by mouth daily. 08/07/18 05/31/19  Minus Breeding, MD  sildenafil (VIAGRA) 100 MG tablet TAKE 1 TABLET BY MOUTH ONCE DAILY AS NEEDED FOR ERECTILE DYSFUNCTION Patient not taking: No sig reported 06/05/18 08/08/19  Minus Breeding, MD    Family History Family History  Problem Relation Age of Onset  . Heart attack Mother 50  . COPD Mother   . Emphysema Mother   . Hypertension Sister     Social History Social History   Tobacco Use  . Smoking status: Never Smoker  . Smokeless tobacco: Never Used  Vaping Use  . Vaping  Use: Never used  Substance Use Topics  . Alcohol use: Yes    Comment: socially  . Drug use: No     Allergies   Bee venom   Review of Systems Review of Systems   Physical Exam Triage Vital Signs ED Triage Vitals  Enc Vitals Group     BP 08/08/19 1023 (!) 244/143     Pulse Rate 08/08/19 1023 66     Resp 08/08/19 1023 16     Temp 08/08/19 1023 97.9 F (36.6 C)     Temp Source 08/08/19 1023 Oral     SpO2 08/08/19 1023 96 %     Weight 08/08/19 1024 208 lb (94.3 kg)     Height 08/08/19 1024 5\' 11"  (1.803 m)     Head Circumference --      Peak Flow --      Pain Score 08/08/19 1024 8     Pain Loc --      Pain Edu? --      Excl. in [place]? --    No data found.  Updated Vital Signs BP (!) 244/143   Pulse 66   Temp 97.9 F (36.6 C) (Oral)   Resp 16   Ht 5\' 11"  (1.803 m)   Wt 208 lb (94.3 kg)  SpO2 96%   BMI 29.01 kg/m   Visual Acuity Right Eye Distance:   Left Eye Distance:   Bilateral Distance:    Right Eye Near:   Left Eye Near:    Bilateral Near:     Physical Exam Vitals and nursing note reviewed.  Constitutional:      Appearance: Normal appearance.  HENT:     Head: Normocephalic and atraumatic.     Nose: Nose normal.  Eyes:     Conjunctiva/sclera: Conjunctivae normal.  Pulmonary:     Effort: Pulmonary effort is normal.  Genitourinary:    Penis: Normal.      Testes: Normal.  Musculoskeletal:        General: Normal range of motion.     Cervical back: Normal range of motion.  Skin:    General: Skin is warm and dry.  Neurological:     Mental Status: He is alert.  Psychiatric:        Mood and Affect: Mood normal.      UC Treatments / Results  Labs (all labs ordered are listed, but only abnormal results are displayed) Labs Reviewed  POCT URINALYSIS DIP (DEVICE) - Abnormal; Notable for the following components:      Result Value   Leukocytes,Ua TRACE (*)    All other components within normal limits  URINE CULTURE  CYTOLOGY, (ORAL, ANAL,  URETHRAL) ANCILLARY ONLY    EKG   Radiology No results found.  Procedures Procedures (including critical care time)  Medications Ordered in UC Medications - No data to display  Initial Impression / Assessment and Plan / UC Course  I have reviewed the triage vital signs and the nursing notes.  Pertinent labs & imaging results that were available during my care of the patient were reviewed by me and considered in my medical decision making (see chart for details).     Dysuria Urine with trace leuks will send for culture. Swab sent for STD testing Recommend follow-up with his doctor for elevated blood pressures Follow up as needed for continued or worsening symptoms  Final Clinical Impressions(s) / UC Diagnoses   Final diagnoses:  Dysuria  Essential hypertension     Discharge Instructions     Your urine is sent for culture We have swabbed you for STDs.  We will cal you with any positive results      ED Prescriptions    None     PDMP not reviewed this encounter.   Orvan July, NP 08/08/19 1128

## 2019-08-08 NOTE — Discharge Instructions (Addendum)
Your urine is sent for culture We have swabbed you for STDs.  We will cal you with any positive results

## 2019-08-08 NOTE — ED Triage Notes (Signed)
Pt c/o 8/10 sharp bilat groin painx1 wk. Pt c/o urine frequency. Pt denies penile discharge or any other urine symptoms.

## 2019-08-10 LAB — URINE CULTURE: Culture: >=100000 — AB

## 2019-08-11 ENCOUNTER — Telehealth (HOSPITAL_COMMUNITY): Payer: Self-pay | Admitting: Emergency Medicine

## 2019-08-11 LAB — CYTOLOGY, (ORAL, ANAL, URETHRAL) ANCILLARY ONLY
Chlamydia: NEGATIVE
Comment: NEGATIVE
Comment: NEGATIVE
Comment: NORMAL
Neisseria Gonorrhea: NEGATIVE
Trichomonas: NEGATIVE

## 2019-08-11 MED ORDER — NITROFURANTOIN MONOHYD MACRO 100 MG PO CAPS
100.0000 mg | ORAL_CAPSULE | Freq: Two times a day (BID) | ORAL | 0 refills | Status: DC
Start: 2019-08-11 — End: 2019-10-08

## 2019-08-11 NOTE — Telephone Encounter (Signed)
Urine culture was positive for  STAPHYLOCOCCUS EPIDERMIDIS   Prescription for nitrofurantoin 100mg  BID x 5 days sent to pharmacy of choice. Pt called and made aware. Pt educated to follow up if symptoms are not improving. Verbalized understanding.

## 2019-09-22 ENCOUNTER — Other Ambulatory Visit: Payer: Self-pay | Admitting: Orthopedic Surgery

## 2019-10-08 ENCOUNTER — Other Ambulatory Visit: Payer: Self-pay

## 2019-10-08 ENCOUNTER — Encounter (HOSPITAL_BASED_OUTPATIENT_CLINIC_OR_DEPARTMENT_OTHER): Payer: Self-pay | Admitting: Orthopedic Surgery

## 2019-10-09 ENCOUNTER — Other Ambulatory Visit (HOSPITAL_COMMUNITY)
Admission: RE | Admit: 2019-10-09 | Discharge: 2019-10-09 | Disposition: A | Discharge status: Home / Self Care | Payer: Medicaid Other | Source: Ambulatory Visit | Attending: Orthopedic Surgery | Admitting: Orthopedic Surgery

## 2019-10-09 ENCOUNTER — Encounter (HOSPITAL_BASED_OUTPATIENT_CLINIC_OR_DEPARTMENT_OTHER)
Admission: RE | Admit: 2019-10-09 | Discharge: 2019-10-09 | Disposition: A | Discharge status: Home / Self Care | Payer: Medicaid Other | Source: Ambulatory Visit | Attending: Orthopedic Surgery | Admitting: Orthopedic Surgery

## 2019-10-09 DIAGNOSIS — Z01812 Encounter for preprocedural laboratory examination: Secondary | ICD-10-CM | POA: Insufficient documentation

## 2019-10-09 DIAGNOSIS — Z20822 Contact with and (suspected) exposure to covid-19: Secondary | ICD-10-CM | POA: Diagnosis not present

## 2019-10-09 LAB — SARS CORONAVIRUS 2 (TAT 6-24 HRS): SARS Coronavirus 2: NEGATIVE

## 2019-10-09 NOTE — Progress Notes (Signed)

## 2019-10-12 NOTE — Anesthesia Preprocedure Evaluation (Addendum)
Anesthesia Evaluation  Patient identified by MRN, date of birth, ID band Patient awake    Reviewed: Allergy & Precautions, Patient's Chart, lab work & pertinent test results  Airway Mallampati: II  TM Distance: >3 FB Neck ROM: Full    Dental no notable dental hx. (+) Teeth Intact, Dental Advisory Given   Pulmonary neg pulmonary ROS,    Pulmonary exam normal breath sounds clear to auscultation       Cardiovascular hypertension, Pt. on medications Normal cardiovascular exam Rhythm:Regular Rate:Normal     Neuro/Psych negative neurological ROS  negative psych ROS   GI/Hepatic negative GI ROS, Neg liver ROS,   Endo/Other  negative endocrine ROS  Renal/GU negative Renal ROS     Musculoskeletal   Abdominal   Peds  Hematology negative hematology ROS (+)   Anesthesia Other Findings   Reproductive/Obstetrics                            Anesthesia Physical Anesthesia Plan  ASA: II  Anesthesia Plan: General   Post-op Pain Management:  Regional for Post-op pain   Induction: Intravenous  PONV Risk Score and Plan: 3 and Treatment may vary due to age or medical condition, Midazolam and Ondansetron  Airway Management Planned: Oral ETT  Additional Equipment:   Intra-op Plan:   Post-operative Plan: Extubation in OR  Informed Consent:     Dental advisory given  Plan Discussed with: CRNA and Anesthesiologist  Anesthesia Plan Comments: (GA w L ISB)       Anesthesia Quick Evaluation

## 2019-10-13 ENCOUNTER — Encounter (HOSPITAL_BASED_OUTPATIENT_CLINIC_OR_DEPARTMENT_OTHER): Payer: Self-pay | Admitting: Orthopedic Surgery

## 2019-10-13 ENCOUNTER — Encounter (HOSPITAL_BASED_OUTPATIENT_CLINIC_OR_DEPARTMENT_OTHER)
Admission: RE | Disposition: A | Discharge status: Home / Self Care | Payer: Self-pay | Source: Home / Self Care | Attending: Orthopedic Surgery

## 2019-10-13 ENCOUNTER — Ambulatory Visit (HOSPITAL_BASED_OUTPATIENT_CLINIC_OR_DEPARTMENT_OTHER)
Admission: RE | Admit: 2019-10-13 | Discharge: 2019-10-13 | Disposition: A | Discharge status: Home / Self Care | Payer: Medicaid Other | Attending: Orthopedic Surgery | Admitting: Orthopedic Surgery

## 2019-10-13 ENCOUNTER — Ambulatory Visit (HOSPITAL_BASED_OUTPATIENT_CLINIC_OR_DEPARTMENT_OTHER): Payer: Medicaid Other | Admitting: Anesthesiology

## 2019-10-13 ENCOUNTER — Other Ambulatory Visit: Payer: Self-pay

## 2019-10-13 DIAGNOSIS — M75102 Unspecified rotator cuff tear or rupture of left shoulder, not specified as traumatic: Secondary | ICD-10-CM | POA: Insufficient documentation

## 2019-10-13 DIAGNOSIS — I10 Essential (primary) hypertension: Secondary | ICD-10-CM | POA: Insufficient documentation

## 2019-10-13 DIAGNOSIS — D1722 Benign lipomatous neoplasm of skin and subcutaneous tissue of left arm: Secondary | ICD-10-CM | POA: Diagnosis not present

## 2019-10-13 DIAGNOSIS — Z79899 Other long term (current) drug therapy: Secondary | ICD-10-CM | POA: Diagnosis not present

## 2019-10-13 HISTORY — PX: LIPOMA EXCISION: SHX5283

## 2019-10-13 SURGERY — EXCISION LIPOMA
Anesthesia: General | Site: Shoulder | Laterality: Left

## 2019-10-13 MED ORDER — PROPOFOL 10 MG/ML IV BOLUS
INTRAVENOUS | Status: DC | PRN
  Administered 2019-10-13: 180 mg via INTRAVENOUS

## 2019-10-13 MED ORDER — LIDOCAINE 2% (20 MG/ML) 5 ML SYRINGE
INTRAMUSCULAR | Status: AC
  Filled 2019-10-13: qty 5

## 2019-10-13 MED ORDER — ROCURONIUM BROMIDE 10 MG/ML (PF) SYRINGE
PREFILLED_SYRINGE | INTRAVENOUS | Status: AC
  Filled 2019-10-13: qty 10

## 2019-10-13 MED ORDER — DEXAMETHASONE SODIUM PHOSPHATE 10 MG/ML IJ SOLN
INTRAMUSCULAR | Status: DC | PRN
  Administered 2019-10-13: 5 mg via INTRAVENOUS

## 2019-10-13 MED ORDER — EPHEDRINE 5 MG/ML INJ
INTRAVENOUS | Status: AC
  Filled 2019-10-13: qty 10

## 2019-10-13 MED ORDER — OXYCODONE-ACETAMINOPHEN 5-325 MG PO TABS: ORAL_TABLET | ORAL | 0 refills | Status: DC

## 2019-10-13 MED ORDER — ONDANSETRON HCL 4 MG/2ML IJ SOLN
INTRAMUSCULAR | Status: DC | PRN
  Administered 2019-10-13: 4 mg via INTRAVENOUS

## 2019-10-13 MED ORDER — EPHEDRINE SULFATE 50 MG/ML IJ SOLN
INTRAMUSCULAR | Status: DC | PRN
  Administered 2019-10-13 (×4): 10 mg via INTRAVENOUS

## 2019-10-13 MED ORDER — MIDAZOLAM HCL 2 MG/2ML IJ SOLN
2.0000 mg | Freq: Once | INTRAMUSCULAR | Status: AC
  Administered 2019-10-13: 2 mg via INTRAVENOUS

## 2019-10-13 MED ORDER — ROCURONIUM BROMIDE 100 MG/10ML IV SOLN
INTRAVENOUS | Status: DC | PRN
  Administered 2019-10-13: 60 mg via INTRAVENOUS

## 2019-10-13 MED ORDER — MIDAZOLAM HCL 2 MG/2ML IJ SOLN
INTRAMUSCULAR | Status: AC
  Filled 2019-10-13: qty 2

## 2019-10-13 MED ORDER — HYDRALAZINE HCL 20 MG/ML IJ SOLN
INTRAMUSCULAR | Status: AC
  Filled 2019-10-13: qty 1

## 2019-10-13 MED ORDER — MEPERIDINE HCL 25 MG/ML IJ SOLN: 6.2500 mg | INTRAMUSCULAR | Status: DC | PRN

## 2019-10-13 MED ORDER — LABETALOL HCL 5 MG/ML IV SOLN
5.0000 mg | INTRAVENOUS | Status: DC | PRN
  Administered 2019-10-13: 5 mg via INTRAVENOUS

## 2019-10-13 MED ORDER — PHENYLEPHRINE 40 MCG/ML (10ML) SYRINGE FOR IV PUSH (FOR BLOOD PRESSURE SUPPORT)
PREFILLED_SYRINGE | INTRAVENOUS | Status: AC
  Filled 2019-10-13: qty 10

## 2019-10-13 MED ORDER — CEFAZOLIN SODIUM-DEXTROSE 2-4 GM/100ML-% IV SOLN
INTRAVENOUS | Status: AC
  Filled 2019-10-13: qty 100

## 2019-10-13 MED ORDER — HYDRALAZINE HCL 20 MG/ML IJ SOLN
15.0000 mg | Freq: Once | INTRAMUSCULAR | Status: AC
  Administered 2019-10-13: 15 mg via INTRAVENOUS

## 2019-10-13 MED ORDER — BUPIVACAINE LIPOSOME 1.3 % IJ SUSP
INTRAMUSCULAR | Status: DC | PRN
  Administered 2019-10-13: 10 mL via PERINEURAL

## 2019-10-13 MED ORDER — HYDRALAZINE HCL 20 MG/ML IJ SOLN
5.0000 mg | Freq: Once | INTRAMUSCULAR | Status: AC
  Administered 2019-10-13: 5 mg via INTRAVENOUS

## 2019-10-13 MED ORDER — FENTANYL CITRATE (PF) 100 MCG/2ML IJ SOLN
INTRAMUSCULAR | Status: AC
  Filled 2019-10-13: qty 2

## 2019-10-13 MED ORDER — LACTATED RINGERS IV SOLN: INTRAVENOUS | Status: DC

## 2019-10-13 MED ORDER — FENTANYL CITRATE (PF) 100 MCG/2ML IJ SOLN
INTRAMUSCULAR | Status: DC | PRN
Start: 2019-10-13 — End: 2019-10-13
  Administered 2019-10-13: 100 ug via INTRAVENOUS

## 2019-10-13 MED ORDER — PROPOFOL 10 MG/ML IV BOLUS
INTRAVENOUS | Status: AC
  Filled 2019-10-13: qty 20

## 2019-10-13 MED ORDER — PHENYLEPHRINE HCL (PRESSORS) 10 MG/ML IV SOLN
INTRAVENOUS | Status: DC | PRN
  Administered 2019-10-13 (×4): 80 ug via INTRAVENOUS

## 2019-10-13 MED ORDER — DEXAMETHASONE SODIUM PHOSPHATE 10 MG/ML IJ SOLN
INTRAMUSCULAR | Status: AC
  Filled 2019-10-13: qty 1

## 2019-10-13 MED ORDER — HYDROMORPHONE HCL 1 MG/ML IJ SOLN: 0.2500 mg | INTRAMUSCULAR | Status: DC | PRN

## 2019-10-13 MED ORDER — FENTANYL CITRATE (PF) 100 MCG/2ML IJ SOLN
100.0000 ug | Freq: Once | INTRAMUSCULAR | Status: AC
  Administered 2019-10-13: 100 ug via INTRAVENOUS

## 2019-10-13 MED ORDER — CEFAZOLIN SODIUM-DEXTROSE 2-4 GM/100ML-% IV SOLN
2.0000 g | INTRAVENOUS | Status: AC
  Administered 2019-10-13: 2 g via INTRAVENOUS

## 2019-10-13 MED ORDER — LABETALOL HCL 5 MG/ML IV SOLN
INTRAVENOUS | Status: AC
  Filled 2019-10-13: qty 4

## 2019-10-13 MED ORDER — LIDOCAINE HCL (CARDIAC) PF 100 MG/5ML IV SOSY
PREFILLED_SYRINGE | INTRAVENOUS | Status: DC | PRN
  Administered 2019-10-13: 100 mg via INTRAVENOUS

## 2019-10-13 MED ORDER — SUGAMMADEX SODIUM 200 MG/2ML IV SOLN
INTRAVENOUS | Status: DC | PRN
  Administered 2019-10-13: 200 mg via INTRAVENOUS

## 2019-10-13 MED ORDER — BUPIVACAINE HCL (PF) 0.5 % IJ SOLN
INTRAMUSCULAR | Status: DC | PRN
  Administered 2019-10-13: 15 mL via PERINEURAL

## 2019-10-13 MED ORDER — HYDRALAZINE HCL 20 MG/ML IJ SOLN
10.0000 mg | Freq: Once | INTRAMUSCULAR | Status: AC
  Administered 2019-10-13: 10 mg via INTRAVENOUS

## 2019-10-13 MED ORDER — OXYCODONE HCL 5 MG/5ML PO SOLN: 5.0000 mg | Freq: Once | ORAL | Status: DC | PRN

## 2019-10-13 MED ORDER — ONDANSETRON HCL 4 MG/2ML IJ SOLN
INTRAMUSCULAR | Status: AC
  Filled 2019-10-13: qty 2

## 2019-10-13 MED ORDER — TIZANIDINE HCL 4 MG PO TABS: 4.0000 mg | ORAL_TABLET | Freq: Three times a day (TID) | ORAL | 1 refills | Status: DC | PRN

## 2019-10-13 MED ORDER — ACETAMINOPHEN 10 MG/ML IV SOLN: 1000.0000 mg | Freq: Once | INTRAVENOUS | Status: DC | PRN

## 2019-10-13 MED ORDER — ONDANSETRON HCL 4 MG/2ML IJ SOLN: 4.0000 mg | Freq: Once | INTRAMUSCULAR | Status: DC | PRN

## 2019-10-13 MED ORDER — OXYCODONE HCL 5 MG PO TABS: 5.0000 mg | ORAL_TABLET | Freq: Once | ORAL | Status: DC | PRN

## 2019-10-13 SURGICAL SUPPLY — 73 items
ADH SKN CLS APL DERMABOND .7 (GAUZE/BANDAGES/DRESSINGS)
ANCH SUT SWLK 19.1X4.75 VT (Anchor) ×1 IMPLANT
ANCHOR PEEK 4.75X19.1 SWLK C (Anchor) ×3 IMPLANT
BENZOIN TINCTURE PRP APPL 2/3 (GAUZE/BANDAGES/DRESSINGS) ×3 IMPLANT
BLADE CLIPPER SURG (BLADE) IMPLANT
CHLORAPREP W/TINT 26 (MISCELLANEOUS) ×3 IMPLANT
CLOSURE WOUND 1/2 X4 (GAUZE/BANDAGES/DRESSINGS) ×1
COVER WAND RF STERILE (DRAPES) ×3 IMPLANT
DECANTER SPIKE VIAL GLASS SM (MISCELLANEOUS) IMPLANT
DERMABOND ADVANCED (GAUZE/BANDAGES/DRESSINGS)
DERMABOND ADVANCED .7 DNX12 (GAUZE/BANDAGES/DRESSINGS) IMPLANT
DRAPE IMP U-DRAPE 54X76 (DRAPES) ×3 IMPLANT
DRAPE INCISE IOBAN 66X45 STRL (DRAPES) ×6 IMPLANT
DRAPE SURG 17X23 STRL (DRAPES) ×3 IMPLANT
DRAPE U-SHAPE 47X51 STRL (DRAPES) ×3 IMPLANT
DRAPE U-SHAPE 76X120 STRL (DRAPES) ×6 IMPLANT
DRSG AQUACEL AG ADV 3.5X10 (GAUZE/BANDAGES/DRESSINGS) ×3 IMPLANT
DRSG PAD ABDOMINAL 8X10 ST (GAUZE/BANDAGES/DRESSINGS) ×3 IMPLANT
ELECT BLADE 4.0 EZ CLEAN MEGAD (MISCELLANEOUS) ×3
ELECT REM PT RETURN 9FT ADLT (ELECTROSURGICAL) ×3
ELECTRODE BLDE 4.0 EZ CLN MEGD (MISCELLANEOUS) ×1 IMPLANT
ELECTRODE REM PT RTRN 9FT ADLT (ELECTROSURGICAL) ×1 IMPLANT
GAUZE 4X4 16PLY RFD (DISPOSABLE) ×3 IMPLANT
GAUZE SPONGE 4X4 12PLY STRL (GAUZE/BANDAGES/DRESSINGS) ×3 IMPLANT
GAUZE XEROFORM 1X8 LF (GAUZE/BANDAGES/DRESSINGS) IMPLANT
GLOVE BIO SURGEON STRL SZ 6.5 (GLOVE) ×2 IMPLANT
GLOVE BIO SURGEON STRL SZ7 (GLOVE) ×3 IMPLANT
GLOVE BIO SURGEON STRL SZ7.5 (GLOVE) ×3 IMPLANT
GLOVE BIO SURGEONS STRL SZ 6.5 (GLOVE) ×1
GLOVE BIOGEL PI IND STRL 6.5 (GLOVE) ×2 IMPLANT
GLOVE BIOGEL PI IND STRL 7.0 (GLOVE) ×1 IMPLANT
GLOVE BIOGEL PI IND STRL 8 (GLOVE) ×1 IMPLANT
GLOVE BIOGEL PI INDICATOR 6.5 (GLOVE) ×4
GLOVE BIOGEL PI INDICATOR 7.0 (GLOVE) ×2
GLOVE BIOGEL PI INDICATOR 8 (GLOVE) ×2
GOWN STRL REUS W/ TWL LRG LVL3 (GOWN DISPOSABLE) ×2 IMPLANT
GOWN STRL REUS W/ TWL XL LVL3 (GOWN DISPOSABLE) ×1 IMPLANT
GOWN STRL REUS W/TWL LRG LVL3 (GOWN DISPOSABLE) ×6
GOWN STRL REUS W/TWL XL LVL3 (GOWN DISPOSABLE) ×3
LASSO CRESCENT QUICKPASS (SUTURE) IMPLANT
MANIFOLD NEPTUNE II (INSTRUMENTS) ×3 IMPLANT
NEEDLE SCORPION MULTI FIRE (NEEDLE) IMPLANT
NS IRRIG 1000ML POUR BTL (IV SOLUTION) ×3 IMPLANT
PACK ARTHROSCOPY DSU (CUSTOM PROCEDURE TRAY) ×3 IMPLANT
PACK BASIN DAY SURGERY FS (CUSTOM PROCEDURE TRAY) ×3 IMPLANT
PENCIL SMOKE EVACUATOR (MISCELLANEOUS) ×3 IMPLANT
RESTRAINT HEAD UNIVERSAL NS (MISCELLANEOUS) ×3 IMPLANT
SLEEVE SCD COMPRESS KNEE MED (MISCELLANEOUS) ×3 IMPLANT
SLING ARM FOAM STRAP LRG (SOFTGOODS) ×3 IMPLANT
SPONGE LAP 4X18 RFD (DISPOSABLE) ×3 IMPLANT
STRIP CLOSURE SKIN 1/2X4 (GAUZE/BANDAGES/DRESSINGS) ×2 IMPLANT
SUCTION FRAZIER HANDLE 10FR (MISCELLANEOUS) ×3
SUCTION TUBE FRAZIER 10FR DISP (MISCELLANEOUS) ×1 IMPLANT
SUPPORT WRAP ARM LG (MISCELLANEOUS) ×3 IMPLANT
SUT ETHILON 3 0 PS 1 (SUTURE) IMPLANT
SUT ETHILON 4 0 PS 2 18 (SUTURE) IMPLANT
SUT FIBERWIRE #2 38 T-5 BLUE (SUTURE)
SUT MNCRL AB 3-0 PS2 18 (SUTURE) IMPLANT
SUT MNCRL AB 4-0 PS2 18 (SUTURE) ×3 IMPLANT
SUT TIGER TAPE 7 IN WHITE (SUTURE) IMPLANT
SUT VIC AB 0 CT1 27 (SUTURE) ×3
SUT VIC AB 0 CT1 27XBRD ANBCTR (SUTURE) ×1 IMPLANT
SUT VIC AB 2-0 CT1 27 (SUTURE) ×6
SUT VIC AB 2-0 CT1 TAPERPNT 27 (SUTURE) ×2 IMPLANT
SUT VIC AB 3-0 SH 27 (SUTURE) ×3
SUT VIC AB 3-0 SH 27X BRD (SUTURE) ×1 IMPLANT
SUTURE FIBERWR #2 38 T-5 BLUE (SUTURE) IMPLANT
SUTURE TAPE 1.3 40 TPR END (SUTURE) ×1 IMPLANT
SUTURE TAPE 1.3 FIBERLOP 20 ST (SUTURE) IMPLANT
SUTURETAPE 1.3 40 TPR END (SUTURE) ×3
SUTURETAPE 1.3 FIBERLOOP 20 ST (SUTURE)
TAPE FIBER 2MM 7IN #2 BLUE (SUTURE) IMPLANT
TOWEL GREEN STERILE FF (TOWEL DISPOSABLE) ×3 IMPLANT

## 2019-10-13 NOTE — Transfer of Care (Signed)
Immediate Anesthesia Transfer of Care Note  Patient: Curtis Saunders  Procedure(s) Performed: SHOULDER EXCISION LIPOMA AND ROTATOR CUFF REPAIR (Left Shoulder)  Patient Location: PACU  Anesthesia Type:General  Level of Consciousness: awake, alert  and oriented  Airway & Oxygen Therapy: Patient Spontanous Breathing and Patient connected to nasal cannula oxygen  Post-op Assessment: Report given to RN and Post -op Vital signs reviewed and stable  Post vital signs: Reviewed and stable  Last Vitals:  Vitals Value Taken Time  BP 200/107 10/13/19 1350  Temp    Pulse 89 10/13/19 1353  Resp 14 10/13/19 1353  SpO2 100 % 10/13/19 1353  Vitals shown include unvalidated device data.  Last Pain:  Vitals:   10/08/19 0912  TempSrc: Oral  PainSc: 8       Patients Stated Pain Goal: 5 (81/85/63 1497)  Complications: No complications documented.

## 2019-10-13 NOTE — Anesthesia Postprocedure Evaluation (Signed)
Anesthesia Post Note  Patient: Curtis Saunders  Procedure(s) Performed: SHOULDER EXCISION LIPOMA AND ROTATOR CUFF REPAIR (Left Shoulder)     Patient location during evaluation: PACU Anesthesia Type: General Level of consciousness: awake and alert Pain management: pain level controlled Vital Signs Assessment: post-procedure vital signs reviewed and stable Respiratory status: spontaneous breathing, nonlabored ventilation, respiratory function stable and patient connected to nasal cannula oxygen Cardiovascular status: blood pressure returned to baseline and stable Postop Assessment: no apparent nausea or vomiting Anesthetic complications: no   No complications documented.  Last Vitals:  Vitals:   10/13/19 1400 10/13/19 1415  BP: (!) 187/112 (!) 175/105  Pulse: 78 78  Resp: 12 14  Temp:    SpO2: 100% 100%    Last Pain:  Vitals:   10/13/19 1415  TempSrc:   PainSc: 0-No pain                 Barnet Glasgow

## 2019-10-13 NOTE — Op Note (Signed)
Procedure(s): SHOULDER EXCISION LIPOMA AND ROTATOR CUFF REPAIR Procedure Note  Curtis Saunders male 64 y.o. 10/13/2019  Preoperative diagnosis: #1 left shoulder large submuscular lipoma  #2 left shoulder rotator cuff tear  Postoperative diagnosis: Same  Procedure(s) and Anesthesia Type:    * SHOULDER EXCISION SUBMUSCULAR LIPOMA (15 CM) AND ROTATOR CUFF REPAIR - Choice  Surgeon(s) and Role:    Tania Ade, MD - Primary   Indications:  64 y.o. male  With large growing mass in the left shoulder which was consistent with lipoma by MRI.  He also was noted to have a rotator cuff tear by MRI.  The rotator cuff tear was also felt to be symptomatic on exam.  He was indicated for surgical excision of the lipoma and repair of the rotator cuff in an open fashion.      Surgeon: Isabella Stalling   Assistants: Jeanmarie Hubert PA-C [facility] was present and scrubbed throughout the procedure and was essential in positioning, retraction, exposure, and closure)  Anesthesia: General endotracheal anesthesia with preoperative interscalene block given by the attending anesthesiologist     Procedure Detail  [place]  Findings: Complete excision of the lipoma.  It measured 15 cm.  There was a small supraspinatus tear which was repaired with 2 suture tapes and one 4.75 peek swivel lock anchor   estimated Blood Loss:  less than 50 mL         Drains: None   Blood Given: none          Specimens: none        Complications:  * No complications entered in OR log *         Disposition: PACU - hemodynamically stable.         Condition: stable    Procedure:   The patient was identified in the preoperative holding area where I personally marked the operative extremity after verifying with the patient and consent. He  was taken to the operating room where He was transferred to the   operative table.  The patient received an interscalene block  in   the holding area by the attending anesthesiologist.  General anesthesia was induced   in the operating room without complication.  The patient did receive IV  Ancef prior to the commencement of the procedure.  The patient was   placed in the beach-chair position with the back raised about 30   degrees.  The nonoperative extremity and head and neck were carefully   positioned and padded protecting against neurovascular compromise.  The   left upper extremity was then prepped and draped in the standard sterile   fashion.    The appropriate operative time-out was performed with   Anesthesia, the perioperative staff, as well as myself and we all agreed   that the left side was the correct operative site.  An approximately   15cm incision was made from the tip of the coracoid to the center point of the   humerus at the level of the axilla over the mass.  Dissection was carried down sharply   through subcutaneous tissues and cephalic vein was identified and taken   laterally with the deltoid.   The deltoid was somewhat adherent to the mass and had been thinned out quite a bit anteriorly.  The mass was circumferentially and carefully dissected away from the surrounding muscular structures.  There was some feeding blood vessels laterally from the deltoid which were carefully  isolated and coagulated.  The mass was carefully peeled from the anterior humerus.  Ultimately the mass was removed in its entirety.  The mass measured about 15 cm in diameter.  The surrounding structures once the mass was removed were examined and noted to be intact.  At this point the proximal humerus was exposed with retractors beneath the acromion and the arm in internal rotation.  I was able to visualize the lateral supraspinatus tear.  The tear measured about 1 cm anterior to posterior without significant retraction.  The insertion site was debrided down to a bleeding surface with a rondure and repair was then carried out by  first placing 2 suture tapes in an inverted mattress configuration through the tear.  These 4 suture strands were then brought over into 1 lateral row 4.75 peek swivel lock anchor bringing the tendon down nicely over the prepared tuberosity.  At this point copious irrigation was used and meticulous hemostasis was obtained.  Drain was not felt to be necessary.  The wound was then closed in layers with 2-0 Vicryl and 4-0 Monocryl for skin closure.  Steri-Strips were applied as well as a sterile dressing.  The patient was placed in a sling, allowed to awaken from anesthesia transferred to the stretcher and taken to the recovery room in stable condition.  Postoperative plan: He will be discharged home today with his family and will follow-up in 65 to 14 days for wound check.  He will follow the standard cuff protocol.

## 2019-10-13 NOTE — Discharge Instructions (Signed)
Discharge Instructions after Open Shoulder Repair  A sling has been provided for you. Remain in your sling at all times. This includes sleeping in your sling.  Use ice on the shoulder intermittently over the first 48 hours after surgery.  Pain medicine has been prescribed for you.  Use your medicine liberally over the first 48 hours, and then you can begin to taper your use. You may take Extra Strength Tylenol or Tylenol only in place of the pain pills. DO NOT take ANY nonsteroidal anti-inflammatory pain medications: Advil, Motrin, Ibuprofen, Aleve, Naproxen or Naprosyn.  The dressing is waterproof. Please leave it on until your first visit with Dr. Tamera Punt. Okay to shower with it on. Take one aspirin, a day for 2 weeks after surgery, unless you have an aspirin sensitivity/ allergy or asthma.   Please call [phone] during normal business hours or [phone] after hours for any problems. Including the following:  - excessive redness of the incisions - drainage for more than 4 days - fever of more than 101.5 F  *Please note that pain medications will not be refilled after hours or on weekends.

## 2019-10-13 NOTE — H&P (Signed)
DIMETRI ARMITAGE is an 64 y.o. male.   Chief Complaint: L shoulder pain with large mass HPI: L shoulder large lipoma with underlying rotator cuff tear.  Past Medical History:  Diagnosis Date  . Hypertension     Past Surgical History:  Procedure Laterality Date  . APPENDECTOMY    . EYE SURGERY    . HAND SURGERY    . KNEE SURGERY     x3, x1 R  . TONSILLECTOMY      Family History  Problem Relation Age of Onset  . Heart attack Mother 63  . COPD Mother   . Emphysema Mother   . Hypertension Sister    Social History:  reports that he has never smoked. He has never used smokeless tobacco. He reports current alcohol use. He reports that he does not use drugs.  Allergies:  Allergies  Allergen Reactions  . Bee Venom Anaphylaxis    Medications Prior to Admission  Medication Sig Dispense Refill  . amLODipine (NORVASC) 10 MG tablet Take 1 tablet (10 mg total) by mouth daily. 90 tablet 3  . ramipril (ALTACE) 10 MG capsule Take 1 capsule (10 mg total) by mouth daily. 90 capsule 3    No results found for this or any previous visit (from the past 48 hour(s)). No results found.  Review of Systems  All other systems reviewed and are negative.   Blood pressure (!) 146/96, pulse 68, temperature 98.5 F (36.9 C), temperature source Oral, resp. rate 17, height 5\' 11"  (1.803 m), weight 98.1 kg, SpO2 100 %. Physical Exam Constitutional:      Appearance: He is well-developed.  HENT:     Head: Atraumatic.  Pulmonary:     Effort: Pulmonary effort is normal.  Musculoskeletal:     Comments: L shoulder with large anterior mass, pain with RC testing. NVID.  Skin:    General: Skin is warm and dry.  Neurological:     Mental Status: He is alert and oriented to person, place, and time.      Assessment/Plan L shoulder large lipoma with underlying rotator cuff tear. Plan excision of large lipoma with rotator cuff repair Risks / benefits of surgery discussed Consent on chart  NPO for  OR Preop antibiotics   Isabella Stalling, MD 10/13/2019, 11:46 AM

## 2019-10-13 NOTE — Anesthesia Procedure Notes (Signed)
Anesthesia Regional Block: Interscalene brachial plexus block   Pre-Anesthetic Checklist: ,, timeout performed, Correct Patient, Correct Site, Correct Laterality, Correct Procedure, Correct Position, site marked, Risks and benefits discussed,  Surgical consent,  Pre-op evaluation,  At surgeon's request and post-op pain management  Laterality: Upper and Left  Prep: Maximum Sterile Barrier Precautions used, chloraprep       Needles:  Injection technique: Single-shot  Needle Type: Echogenic Needle     Needle Length: 5cm  Needle Gauge: 21     Additional Needles:   Procedures:,,,, ultrasound used (permanent image in chart),,,,  Narrative:  Start time: 10/13/2019 10:49 AM End time: 10/13/2019 10:56 AM Injection made incrementally with aspirations every 5 mL.  Performed by: Personally  Anesthesiologist: Barnet Glasgow, MD  Additional Notes: Block assessed prior to procedure. Patient tolerated procedure well.

## 2019-10-13 NOTE — Progress Notes (Addendum)
Assisted Dr. Valma Cava with left, ultrasound guided, interscalene brachial plexus block. Side rails up, monitors on throughout procedure. See vital signs in flow sheet. Tolerated Procedure well.

## 2019-10-13 NOTE — Anesthesia Procedure Notes (Signed)
Procedure Name: Intubation Date/Time: 10/13/2019 12:19 PM Performed by: Bufford Spikes, CRNA Pre-anesthesia Checklist: Patient identified, Emergency Drugs available, Suction available and Patient being monitored Patient Re-evaluated:Patient Re-evaluated prior to induction Oxygen Delivery Method: Circle system utilized Preoxygenation: Pre-oxygenation with 100% oxygen Induction Type: IV induction Ventilation: Mask ventilation without difficulty Laryngoscope Size: Miller and 2 Grade View: Grade II Tube type: Oral Tube size: 7.0 mm Number of attempts: 1 Airway Equipment and Method: Stylet and Oral airway Placement Confirmation: ETT inserted through vocal cords under direct vision,  positive ETCO2 and breath sounds checked- equal and bilateral Secured at: 22 cm Tube secured with: Tape Dental Injury: Teeth and Oropharynx as per pre-operative assessment

## 2019-10-14 ENCOUNTER — Encounter (HOSPITAL_BASED_OUTPATIENT_CLINIC_OR_DEPARTMENT_OTHER): Payer: Self-pay | Admitting: Orthopedic Surgery

## 2019-10-14 LAB — SURGICAL PATHOLOGY

## 2019-10-15 NOTE — Addendum Note (Signed)
Addendum  created 10/15/19 0753 by Yamir Carignan, Ernesta Amble, CRNA   Charge Capture section accepted

## 2019-10-29 ENCOUNTER — Encounter: Payer: Self-pay | Admitting: Physical Therapy

## 2019-10-29 ENCOUNTER — Other Ambulatory Visit: Payer: Self-pay

## 2019-10-29 ENCOUNTER — Ambulatory Visit: Payer: Medicaid Other | Attending: Orthopedic Surgery | Admitting: Physical Therapy

## 2019-10-29 DIAGNOSIS — Z9889 Other specified postprocedural states: Secondary | ICD-10-CM | POA: Insufficient documentation

## 2019-10-29 DIAGNOSIS — M6281 Muscle weakness (generalized): Secondary | ICD-10-CM

## 2019-10-29 DIAGNOSIS — R600 Localized edema: Secondary | ICD-10-CM | POA: Diagnosis present

## 2019-10-29 DIAGNOSIS — M25512 Pain in left shoulder: Secondary | ICD-10-CM | POA: Diagnosis not present

## 2019-10-29 NOTE — Therapy (Signed)
[place], [name], [address] Phone: [phone]   Fax:  [phone]  Physical Therapy Evaluation  Patient Details  Name: Curtis Saunders MRN: [id number] Date of Birth: May 07, 1955 Referring Provider (PT): Dr. Tania Ade    Encounter Date: 10/29/2019   PT End of Session - 10/29/19 1958    Visit Number 1    Number of Visits 16    Date for PT Re-Evaluation 12/24/19    Authorization Type UHC MCD    Authorization - Number of Visits 27    PT Start Time 2585    PT Stop Time 1620    PT Time Calculation (min) 45 min    Activity Tolerance Patient tolerated treatment well    Behavior During Therapy [facility] for tasks assessed/performed           Past Medical History:  Diagnosis Date  . Hypertension     Past Surgical History:  Procedure Laterality Date  . APPENDECTOMY    . EYE SURGERY    . HAND SURGERY    . KNEE SURGERY     x3, x1 R  . LIPOMA EXCISION Left 10/13/2019   Procedure: SHOULDER EXCISION LIPOMA AND ROTATOR CUFF REPAIR;  Surgeon: Tania Ade, MD;  Location: [facility];  Service: Orthopedics;  Laterality: Left;  . TONSILLECTOMY      There were no vitals filed for this visit.    Subjective Assessment - 10/29/19 1510    Subjective Pt underwent Lt shoulder surgery on 10/23/19, open repair.  He wears a sling but not at night. .  He reports pain in shoulder is improved after surgery, issue was onging for many years.   He has difficulty raising arm, lifting and getting dressed and preparing food for himself as he lives alone.    Pertinent History lateral supraspinatus tear, lipoma removed during surgery    Limitations Lifting;House hold activities    Patient Stated Goals Patient would like to be able to use his hand /arm again without pain    Currently in Pain? Yes    Pain Score 8     Pain Location Shoulder    Pain Orientation Left;Anterior;Upper    Pain Descriptors /  Indicators Sore;Aching    Pain Type Surgical pain    Pain Radiating Towards elbow    Pain Onset In the past 7 days    Pain Frequency Constant    Aggravating Factors  using it    Pain Relieving Factors rest, meds but he ran out    Effect of Pain on Daily Activities limited in what he can do for himself    Multiple Pain Sites No              [facility] PT Assessment - 10/29/19 0001      Assessment   Medical Diagnosis L shoulder open RCR     Referring Provider (PT) Dr. 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       Cognition   Overall Cognitive Status Within Functional Limits for tasks assessed      Observation/Other Assessments   Focus on Therapeutic Outcomes (FOTO)  NT due to MCD       Sensation   Light Touch Appears Intact      Coordination   Gross Motor Movements are Fluid and Coordinated Not tested      Posture/Postural Control   Posture/Postural Control Postural limitations    Postural Limitations Rounded Shoulders;Forward head      PROM   Left Shoulder Flexion 120 Degrees    Left Shoulder ABduction 50 Degrees    Left Shoulder Internal Rotation 40 Degrees    Left Shoulder External Rotation 20 Degrees   at 20 deg abduciton      Strength   Overall Strength Comments NT due to surgical restrictions       Palpation   Palpation comment tender throughout L anterior  shoulder, clavicle and laterally to deltoid , visible edema                       Objective measurements completed on examination: See above findings.       [facility] Adult PT Treatment/Exercise - 10/29/19 0001      Shoulder Exercises: Seated   Retraction Weight (lbs) x 10       Shoulder Exercises: ROM/Strengthening   Pendulum 2 min ,all planes and circular min cues       Shoulder Exercises: Stretch   Table Stretch - Flexion 10 seconds    Table Stretch -Flexion Limitations x 10     Table Stretch - Abduction 10 seconds    Table Stretch - ABduction Limitations x 3 , painful       Vasopneumatic   Number Minutes Vasopneumatic  10 minutes   10   Vasopnuematic Location  Shoulder    Vasopneumatic Pressure Low    Vasopneumatic Temperature  34                  PT Education - 10/29/19 1957    Education Details PT/POC , HEP, precautions, no AROM., swelling, avoid heat    Person(s) Educated Patient    Methods Explanation;Handout;Demonstration    Comprehension Verbalized understanding;Verbal cues required            PT Short Term Goals - 10/29/19 1958      PT SHORT TERM GOAL #1   Title Pt will be I with HEP for PROM, posture and RICE    Baseline unknown, given today    Time 4    Period Weeks    Status New    Target Date 11/26/19      PT SHORT TERM GOAL #2   Title Pt will be able to report no pain at rest, sitting, no sling as MD allows    Baseline pain mod to severe, sling    Time 4    Period Weeks    Status New    Target Date 11/26/19      PT SHORT TERM GOAL #3   Title Pt will understand and adhere to protocol for shoulder activity    Baseline needs cues, unknown prior  to today    Time 4    Period Weeks    Status New    Target Date 11/26/19             PT Long Term Goals - 10/29/19 2002      PT LONG TERM GOAL #1   Title Pt will be able to lift L arm overhead in standing to 120 deg or more for functional reach in his home    Baseline  unable due to protocol    Time 8    Period Weeks    Status New    Target Date 12/24/19      PT LONG TERM GOAL #2   Title Pt will be able to complete ADLs, dressing and grooming with L UE use and without increase in shoulder pain    Baseline uses Rt UE    Time 8    Period Weeks    Status New    Target Date 12/24/19      PT LONG TERM GOAL #3   Title Pt will be able to use L UE to hold item (close to body) while using Rt UE for fine motor activities    Baseline unable    Time 8    Period Weeks    Status New    Target Date 12/24/19      PT LONG TERM GOAL #4   Title Pt will be I with HEP for shoulder AROM, posture, strength    Baseline unknown    Time 8    Period Weeks    Status New    Target Date 12/24/19                  Plan - 10/29/19 2007    Clinical Impression Statement This patient presents for low complexity evaluation of L shoulder open rotator cuff repair on 10/23/19.  He has expected level of pain, disbaility due to surgical precautions of L UE.  He reports is ambidextrous but having difficulty at home with ADLs.  He is not working and has a limited community interaction but may be more motivated to do so if his shoulder was more comfortable.  He needed reinforcement about non use of L UE and has likely been using arm inadvertently.  He needs early PT in this time to ensure proper execution of HEP and adherence to precautions.    Personal Factors and Comorbidities Comorbidity 1;Social Background;Behavior Pattern;Time since onset of injury/illness/exacerbation    Comorbidities HTN    Examination-Activity Limitations Bathing;Hygiene/Grooming;Bed Mobility;Lift;Reach Overhead;Carry;Sleep    Examination-Participation Restrictions Community Activity;Meal Prep;Driving    Stability/Clinical Decision Making Stable/Uncomplicated    Clinical Decision Making Low    Rehab Potential Excellent    PT Frequency 2x / week    PT Duration 8 weeks    PT Treatment/Interventions  ADLs/Self Care Home Management;Therapeutic activities;Patient/family education;Taping;Therapeutic exercise;Vasopneumatic Device;Passive range of motion;Manual techniques;Functional mobility training;Ultrasound;Cryotherapy;Moist Heat    PT Next Visit Plan HEP PROM, follow protocol    PT Home Exercise Plan pendulum , PROM table slides, scapualr retraction    Consulted and Agree with Plan of Care Patient           Patient will benefit from skilled therapeutic intervention in order to improve the following deficits and impairments:  Increased fascial restricitons, Pain, Postural dysfunction, Decreased mobility, Decreased range of motion, Decreased strength, Hypomobility, Impaired UE functional use, Impaired flexibility, Increased edema  Visit Diagnosis: Acute pain of left shoulder  Status post left rotator  cuff repair  Localized edema  Muscle weakness (generalized)     Problem List Patient Active Problem List   Diagnosis Date Noted  . LVH (left ventricular hypertrophy) 03/06/2018  . Essential hypertension 03/06/2018  . HTN (hypertension), malignant 02/20/2018  . Chest pain 02/20/2018    Curtis Saunders 10/29/2019, 8:15 PM  [facility] [address] [place], [name], [address] Phone: [phone]   Fax:  [phone]  Name: Curtis Saunders MRN: [id number] Date of Birth: Jul 22, 1955 Raeford Razor, PT 10/29/19 8:16 PM Phone: [phone] Fax: [phone]

## 2019-11-11 ENCOUNTER — Ambulatory Visit: Payer: Medicaid Other | Admitting: Physical Therapy

## 2019-11-11 ENCOUNTER — Encounter: Payer: Self-pay | Admitting: Physical Therapy

## 2019-11-11 ENCOUNTER — Other Ambulatory Visit: Payer: Self-pay

## 2019-11-11 DIAGNOSIS — M25512 Pain in left shoulder: Secondary | ICD-10-CM

## 2019-11-11 DIAGNOSIS — M6281 Muscle weakness (generalized): Secondary | ICD-10-CM

## 2019-11-11 DIAGNOSIS — R600 Localized edema: Secondary | ICD-10-CM

## 2019-11-11 DIAGNOSIS — Z9889 Other specified postprocedural states: Secondary | ICD-10-CM

## 2019-11-11 NOTE — Therapy (Signed)
[place], [name], [address] Phone: [phone]   Fax:  [phone]  Physical Therapy Treatment  Patient Details  Name: Curtis Saunders MRN: [id number] Date of Birth: May 12, 1955 Referring Provider (PT): Dr. Tania Ade    Encounter Date: 11/11/2019   PT End of Session - 11/11/19 0755    Visit Number 2    Number of Visits 16    Date for PT Re-Evaluation 12/24/19    Authorization Type UHC MCD    Authorization - Number of Visits 27    PT Start Time 0745    PT Stop Time 0835    PT Time Calculation (min) 50 min           Past Medical History:  Diagnosis Date   Hypertension     Past Surgical History:  Procedure Laterality Date   APPENDECTOMY     EYE SURGERY     HAND SURGERY     KNEE SURGERY     x3, x1 R   LIPOMA EXCISION Left 10/13/2019   Procedure: SHOULDER EXCISION LIPOMA AND ROTATOR CUFF REPAIR;  Surgeon: Tania Ade, MD;  Location: [facility];  Service: Orthopedics;  Laterality: Left;   TONSILLECTOMY      There were no vitals filed for this visit.   Subjective Assessment - 11/11/19 0749    Subjective Patient here for 1st treatment.  Has been doing his exercises, make it sore. TOday pain is high, 10/10    Currently in Pain? Yes    Pain Score 10-Worst pain ever    Pain Location Shoulder    Pain Orientation Left;Anterior    Pain Descriptors / Indicators Aching;Sore;Other (Comment)   hurting pain   Pain Onset 1 to 4 weeks ago    Pain Frequency Intermittent    Aggravating Factors  being up , moving around    Pain Relieving Factors rest, meds    Multiple Pain Sites No                             [facility] Adult PT Treatment/Exercise - 11/11/19 0001      Elbow Exercises   Elbow Flexion AROM;Left;20 reps;Supine      Shoulder Exercises: Seated   Retraction Weight (lbs) 2 x 10       Shoulder Exercises: Pulleys   Flexion 2 minutes     Flexion Limitations cues to reduce ROM, stopped due to pain after 1 min , reduce ROM       Shoulder Exercises: ROM/Strengthening   Other ROM/Strengthening Exercises --      Shoulder Exercises: Stretch   Table Stretch - Flexion 10 seconds    Table Stretch -Flexion Limitations x 10     Table Stretch - Abduction 10 seconds    Table Stretch - ABduction Limitations x 10     Table Stretch - External Rotation 10 seconds    Table Stretch - External Rotation Limitations x 10       Moist Heat Therapy   Number Minutes Moist Heat 10 Minutes   preferred to ice    Moist Heat Location Shoulder      Manual Therapy   Manual Therapy Joint mobilization;Passive ROM    Manual therapy comments joint distraction multi level to decrease pain     Joint Mobilization inferior glides L GH joint in abduction, well tolerated     Passive ROM flexion, abduction (to about 90 deg)  and ER to tolerance ( about 20 deg at slight abd )                   PT Education - 11/11/19 0831    Education Details Precautions, relaxing arm to reduce pain with PROM, putty    Person(s) Educated Patient    Methods Explanation    Comprehension Verbalized understanding;Need further instruction            PT Short Term Goals - 11/11/19 0830      PT SHORT TERM GOAL #1   Title Pt will be I with HEP for PROM, posture and RICE    Baseline cues needed    Status On-going      PT SHORT TERM GOAL #2   Title Pt will be able to report no pain at rest, sitting, no sling as MD allows    Baseline has rare times when pain is absent    Status On-going      PT SHORT TERM GOAL #3   Title Pt will understand and adhere to protocol for shoulder activity    Baseline improved comprehension today    Status On-going             PT Long Term Goals - 10/29/19 2002      PT LONG TERM GOAL #1   Title Pt will be able to lift L arm overhead in standing to 120 deg or more for functional reach in his home    Baseline unable due to  protocol    Time 8    Period Weeks    Status New    Target Date 12/24/19      PT LONG TERM GOAL #2   Title Pt will be able to complete ADLs, dressing and grooming with L UE use and without increase in shoulder pain    Baseline uses Rt UE    Time 8    Period Weeks    Status New    Target Date 12/24/19      PT LONG TERM GOAL #3   Title Pt will be able to use L UE to hold item (close to body) while using Rt UE for fine motor activities    Baseline unable    Time 8    Period Weeks    Status New    Target Date 12/24/19      PT LONG TERM GOAL #4   Title Pt will be I with HEP for shoulder AROM, posture, strength    Baseline unknown    Time 8    Period Weeks    Status New    Target Date 12/24/19                 Plan - 11/11/19 0756    Clinical Impression Statement Patient is 2.5 weeks post op.  He has increased pain today and difficulty relaxing L UE for passive motion.  Improved as session progressed. Given putty for grip, urged to wear sling until he sees MD at 4 weeks.  Min to mod cues needed for ROM exercises.  Cont POC.    PT Treatment/Interventions ADLs/Self Care Home Management;Therapeutic activities;Patient/family education;Taping;Therapeutic exercise;Vasopneumatic Device;Passive range of motion;Manual techniques;Functional mobility training;Ultrasound;Cryotherapy;Moist Heat    PT Next Visit Plan HEP PROM, follow protocol    PT Home Exercise Plan pendulum , PROM table slides, scapualr retraction    Consulted and Agree with Plan of Care Patient  Patient will benefit from skilled therapeutic intervention in order to improve the following deficits and impairments:  Increased fascial restricitons, Pain, Postural dysfunction, Decreased mobility, Decreased range of motion, Decreased strength, Hypomobility, Impaired UE functional use, Impaired flexibility, Increased edema  Visit Diagnosis: Acute pain of left shoulder  Status post left rotator cuff  repair  Localized edema  Muscle weakness (generalized)     Problem List Patient Active Problem List   Diagnosis Date Noted   LVH (left ventricular hypertrophy) 03/06/2018   Essential hypertension 03/06/2018   HTN (hypertension), malignant 02/20/2018   Chest pain 02/20/2018    Curtis Saunders 11/11/2019, 8:32 AM  [facility] [address] [place], [name], [address] Phone: [phone]   Fax:  [phone]  Name: Curtis Saunders MRN: [id number] Date of Birth: September 16, 1955  Raeford Razor, PT 11/11/19 8:32 AM Phone: [phone] Fax: [phone]

## 2019-11-13 ENCOUNTER — Encounter: Payer: Self-pay | Admitting: Physical Therapy

## 2019-11-13 ENCOUNTER — Other Ambulatory Visit: Payer: Self-pay

## 2019-11-13 ENCOUNTER — Ambulatory Visit: Payer: Medicaid Other | Admitting: Physical Therapy

## 2019-11-13 DIAGNOSIS — M25512 Pain in left shoulder: Secondary | ICD-10-CM | POA: Diagnosis not present

## 2019-11-13 DIAGNOSIS — Z9889 Other specified postprocedural states: Secondary | ICD-10-CM

## 2019-11-13 DIAGNOSIS — R600 Localized edema: Secondary | ICD-10-CM

## 2019-11-13 DIAGNOSIS — M6281 Muscle weakness (generalized): Secondary | ICD-10-CM

## 2019-11-13 NOTE — Therapy (Signed)
[place], [name], [address] Phone: [phone]   Fax:  [phone]  Physical Therapy Treatment  Patient Details  Name: Curtis Saunders MRN: [id number] Date of Birth: December 23, 1955 Referring Provider (PT): Dr. Tania Ade    Encounter Date: 11/13/2019   PT End of Session - 11/13/19 0839    Visit Number 3    Number of Visits 16    Date for PT Re-Evaluation 12/24/19    Authorization Type UHC MCD    PT Start Time 0800    PT Stop Time 0841    PT Time Calculation (min) 41 min    Activity Tolerance Patient tolerated treatment well    Behavior During Therapy [facility] for tasks assessed/performed           Past Medical History:  Diagnosis Date  . Hypertension     Past Surgical History:  Procedure Laterality Date  . APPENDECTOMY    . EYE SURGERY    . HAND SURGERY    . KNEE SURGERY     x3, x1 R  . LIPOMA EXCISION Left 10/13/2019   Procedure: SHOULDER EXCISION LIPOMA AND ROTATOR CUFF REPAIR;  Surgeon: Tania Ade, MD;  Location: [facility];  Service: Orthopedics;  Laterality: Left;  . TONSILLECTOMY      There were no vitals filed for this visit.   Subjective Assessment - 11/13/19 0835    Subjective Patient continues to report soreness but not as bad as last time. He reports more soreness at night. He comes in today without a sling on. he reports his doctor told him he can take it off. This is questionable given he is only 4 weeks post-op RTC repair.    Pertinent History lateral supraspinatus tear, lipoma removed during surgery    Limitations Lifting;House hold activities    Patient Stated Goals Patient would like to be able to use his hand /arm again without pain    Currently in Pain? Yes    Pain Score 8     Pain Location Shoulder    Pain Orientation Left;Anterior    Pain Descriptors / Indicators Aching    Pain Type Surgical pain    Pain Onset 1 to 4 weeks ago    Pain Frequency  Intermittent    Aggravating Factors  sleeping at night    Pain Relieving Factors rest    Effect of Pain on Daily Activities limited activity              [facility] PT Assessment - 11/13/19 0001      PROM   Left Shoulder Internal Rotation 60 Degrees    Left Shoulder External Rotation 40 Degrees                         [facility] Adult PT Treatment/Exercise - 11/13/19 0001      Elbow Exercises   Elbow Flexion AROM;Left;20 reps;Supine      Shoulder Exercises: Seated   Retraction Weight (lbs) 2 x 10     Retraction Limitations mod cuing for technique       Shoulder Exercises: Stretch   Table Stretch - Flexion 10 seconds    Table Stretch -Flexion Limitations x 10       Manual Therapy   Manual Therapy Joint mobilization;Passive ROM    Manual therapy comments joint distraction multi level to decrease pain     Joint Mobilization inferior glides L GH joint in abduction, well  tolerated     Passive ROM flexion, abduction (to about 90 deg) and ER to tolerance ( about 20 deg at slight abd )                   PT Education - 11/13/19 0838    Education Details percuations, protocol time frame;    Person(s) Educated Patient    Methods Explanation;Tactile cues;Verbal cues;Demonstration    Comprehension Returned demonstration;Verbalized understanding;Tactile cues required;Verbal cues required            PT Short Term Goals - 11/11/19 0830      PT SHORT TERM GOAL #1   Title Pt will be I with HEP for PROM, posture and RICE    Baseline cues needed    Status On-going      PT SHORT TERM GOAL #2   Title Pt will be able to report no pain at rest, sitting, no sling as MD allows    Baseline has rare times when pain is absent    Status On-going      PT SHORT TERM GOAL #3   Title Pt will understand and adhere to protocol for shoulder activity    Baseline improved comprehension today    Status On-going             PT Long Term Goals - 10/29/19 2002      PT LONG  TERM GOAL #1   Title Pt will be able to lift L arm overhead in standing to 120 deg or more for functional reach in his home    Baseline unable due to protocol    Time 8    Period Weeks    Status New    Target Date 12/24/19      PT LONG TERM GOAL #2   Title Pt will be able to complete ADLs, dressing and grooming with L UE use and without increase in shoulder pain    Baseline uses Rt UE    Time 8    Period Weeks    Status New    Target Date 12/24/19      PT LONG TERM GOAL #3   Title Pt will be able to use L UE to hold item (close to body) while using Rt UE for fine motor activities    Baseline unable    Time 8    Period Weeks    Status New    Target Date 12/24/19      PT LONG TERM GOAL #4   Title Pt will be I with HEP for shoulder AROM, posture, strength    Baseline unknown    Time 8    Period Weeks    Status New    Target Date 12/24/19                 Plan - 11/13/19 4782    Clinical Impression Statement Patient is making progress with his passive ROM. He reports pain at night. This is likely becuase he is not wearing his sling. He was advised that it is unlikley that S/P RTC repair that he should be out of it but he inisits the MD told him not to wear it. Therapy will continue to progress per rpotocol. Therapy today focused on manual therapy to improve ROM.    Personal Factors and Comorbidities Comorbidity 1;Social Background;Behavior Pattern;Time since onset of injury/illness/exacerbation    Comorbidities HTN    Examination-Activity Limitations Bathing;Hygiene/Grooming;Bed Mobility;Lift;Reach Overhead;Carry;Sleep    Examination-Participation Restrictions Community Activity;Meal Prep;Driving  Stability/Clinical Decision Making Stable/Uncomplicated    Clinical Decision Making Low    Rehab Potential Excellent    PT Frequency 2x / week    PT Treatment/Interventions ADLs/Self Care Home Management;Therapeutic activities;Patient/family education;Taping;Therapeutic  exercise;Vasopneumatic Device;Passive range of motion;Manual techniques;Functional mobility training;Ultrasound;Cryotherapy;Moist Heat    PT Next Visit Plan HEP PROM, follow protocol    PT Home Exercise Plan pendulum , PROM table slides, scapualr retraction    Consulted and Agree with Plan of Care Patient           Patient will benefit from skilled therapeutic intervention in order to improve the following deficits and impairments:  Increased fascial restricitons, Pain, Postural dysfunction, Decreased mobility, Decreased range of motion, Decreased strength, Hypomobility, Impaired UE functional use, Impaired flexibility, Increased edema  Visit Diagnosis: Acute pain of left shoulder  Status post left rotator cuff repair  Localized edema  Muscle weakness (generalized)     Problem List Patient Active Problem List   Diagnosis Date Noted  . LVH (left ventricular hypertrophy) 03/06/2018  . Essential hypertension 03/06/2018  . HTN (hypertension), malignant 02/20/2018  . Chest pain 02/20/2018    Carney Living PT DPT  11/13/2019, 9:27 AM  [facility] [address] [place], [name], [address] Phone: [phone]   Fax:  [phone]  Name: JERIN FRANZEL MRN: [id number] Date of Birth: 06-29-1955

## 2019-11-17 ENCOUNTER — Other Ambulatory Visit: Payer: Self-pay

## 2019-11-17 ENCOUNTER — Ambulatory Visit: Payer: Medicaid Other | Admitting: Physical Therapy

## 2019-11-17 ENCOUNTER — Encounter: Payer: Self-pay | Admitting: Physical Therapy

## 2019-11-17 DIAGNOSIS — M25512 Pain in left shoulder: Secondary | ICD-10-CM

## 2019-11-17 DIAGNOSIS — M6281 Muscle weakness (generalized): Secondary | ICD-10-CM

## 2019-11-17 DIAGNOSIS — R600 Localized edema: Secondary | ICD-10-CM

## 2019-11-17 DIAGNOSIS — Z9889 Other specified postprocedural states: Secondary | ICD-10-CM

## 2019-11-17 NOTE — Patient Instructions (Signed)
Seated Stretch: Neck / Shoulder    Reaching behind, place right hand on opposite temple. Gently pull head forward and to the right. Hold ___30_ seconds. Repeat with other hand. Repeat __3__ times. Do ____ 1-2 sessions per day. [email or web address]

## 2019-11-17 NOTE — Therapy (Signed)
[place], [name], [address] Phone: [phone]   Fax:  [phone]  Physical Therapy Treatment  Patient Details  Name: Curtis Saunders MRN: [id number] Date of Birth: 07-03-1955 Referring Provider (PT): Dr. Tania Ade    Encounter Date: 11/17/2019   PT End of Session - 11/17/19 1006    Visit Number 4    Number of Visits 16    Date for PT Re-Evaluation 12/24/19    Authorization Type UHC MCD    Authorization - Number of Visits 27    PT Start Time 1004    PT Stop Time 1040    PT Time Calculation (min) 36 min    Activity Tolerance Patient tolerated treatment well    Behavior During Therapy WFL for tasks assessed/performed           Past Medical History:  Diagnosis Date   Hypertension     Past Surgical History:  Procedure Laterality Date   APPENDECTOMY     EYE SURGERY     HAND SURGERY     KNEE SURGERY     x3, x1 R   LIPOMA EXCISION Left 10/13/2019   Procedure: SHOULDER EXCISION LIPOMA AND ROTATOR CUFF REPAIR;  Surgeon: Tania Ade, MD;  Location: [facility];  Service: Orthopedics;  Laterality: Left;   TONSILLECTOMY      There were no vitals filed for this visit.   Subjective Assessment - 11/17/19 1004    Subjective No pain right now.  Does not know when he is to see Dr. Tamera Punt    Currently in Pain? No/denies                   [facility] Adult PT Treatment/Exercise - 11/17/19 0001      Shoulder Exercises: Seated   Retraction Strengthening;Both;15 reps    Retraction Weight (lbs) manual Asst    External Rotation PROM;Left;10 reps    External Rotation Weight (lbs) table stretch      Shoulder Exercises: ROM/Strengthening   Ranger seated flexion, circles below shoulder height , cues to relax L scap and used Rt UE to assist       Shoulder Exercises: Stretch   Other Shoulder Stretches upper trap stretch 3 x 20 sec       Manual Therapy   Joint  Mobilization inferior glides,P/A    Myofascial Release L upper trap, supraspinatus     Scapular Mobilization L scapula with light resistance in elevation, depression , retraction , mod cues     Passive ROM all planes to tolerance                   PT Education - 11/17/19 1049    Education Details neck tension, reinforce protocol    Person(s) Educated Patient    Methods Explanation;Handout    Comprehension Verbalized understanding            PT Short Term Goals - 11/11/19 0830      PT SHORT TERM GOAL #1   Title Pt will be I with HEP for PROM, posture and RICE    Baseline cues needed    Status On-going      PT SHORT TERM GOAL #2   Title Pt will be able to report no pain at rest, sitting, no sling as MD allows    Baseline has rare times when pain is absent    Status On-going      PT SHORT TERM GOAL #3  Title Pt will understand and adhere to protocol for shoulder activity    Baseline improved comprehension today    Status On-going             PT Long Term Goals - 10/29/19 2002      PT LONG TERM GOAL #1   Title Pt will be able to lift L arm overhead in standing to 120 deg or more for functional reach in his home    Baseline unable due to protocol    Time 8    Period Weeks    Status New    Target Date 12/24/19      PT LONG TERM GOAL #2   Title Pt will be able to complete ADLs, dressing and grooming with L UE use and without increase in shoulder pain    Baseline uses Rt UE    Time 8    Period Weeks    Status New    Target Date 12/24/19      PT LONG TERM GOAL #3   Title Pt will be able to use L UE to hold item (close to body) while using Rt UE for fine motor activities    Baseline unable    Time 8    Period Weeks    Status New    Target Date 12/24/19      PT LONG TERM GOAL #4   Title Pt will be I with HEP for shoulder AROM, posture, strength    Baseline unknown    Time 8    Period Weeks    Status New    Target Date 12/24/19                  Plan - 11/17/19 1015    Clinical Impression Statement Patient continues to go without his sling.  Pain was improved today, had non at rest.  Used Rt UE to control LUE activity with UE ranger and stretching. Focus on activation of lower trap and reducing excess uppoer trap tension.  Declined modalities post session.    PT Treatment/Interventions ADLs/Self Care Home Management;Therapeutic activities;Patient/family education;Taping;Therapeutic exercise;Vasopneumatic Device;Passive range of motion;Manual techniques;Functional mobility training;Ultrasound;Cryotherapy;Moist Heat;Iontophoresis 4mg /ml Dexamethasone    PT Next Visit Plan HEP PROM, follow protocol    PT Home Exercise Plan pendulum , PROM table slides, scapular retraction, upper trap stretch    Consulted and Agree with Plan of Care Patient           Patient will benefit from skilled therapeutic intervention in order to improve the following deficits and impairments:  Increased fascial restricitons, Pain, Postural dysfunction, Decreased mobility, Decreased range of motion, Decreased strength, Hypomobility, Impaired UE functional use, Impaired flexibility, Increased edema  Visit Diagnosis: Acute pain of left shoulder  Status post left rotator cuff repair  Localized edema  Muscle weakness (generalized)     Problem List Patient Active Problem List   Diagnosis Date Noted   LVH (left ventricular hypertrophy) 03/06/2018   Essential hypertension 03/06/2018   HTN (hypertension), malignant 02/20/2018   Chest pain 02/20/2018    Bergen Melle 11/17/2019, 10:57 AM  [facility] [address] [place], [name], [address] Phone: [phone]   Fax:  [phone]  Name: Curtis Saunders MRN: [id number] Date of Birth: 03-Aug-1955  Raeford Razor, PT 11/17/19 10:57 AM Phone: [phone] Fax: [phone]

## 2019-11-19 ENCOUNTER — Ambulatory Visit: Payer: Medicaid Other | Admitting: Physical Therapy

## 2019-11-19 ENCOUNTER — Other Ambulatory Visit: Payer: Self-pay

## 2019-11-19 ENCOUNTER — Encounter: Payer: Self-pay | Admitting: Physical Therapy

## 2019-11-19 DIAGNOSIS — R600 Localized edema: Secondary | ICD-10-CM

## 2019-11-19 DIAGNOSIS — M25512 Pain in left shoulder: Secondary | ICD-10-CM | POA: Diagnosis not present

## 2019-11-19 DIAGNOSIS — Z9889 Other specified postprocedural states: Secondary | ICD-10-CM

## 2019-11-19 DIAGNOSIS — M6281 Muscle weakness (generalized): Secondary | ICD-10-CM

## 2019-11-19 NOTE — Therapy (Signed)
[place], [name], [address] Phone: [phone]   Fax:  [phone]  Physical Therapy Treatment  Patient Details  Name: Curtis Saunders MRN: [id number] Date of Birth: 08-18-1955 Referring Provider (PT): Dr. Tania Ade    Encounter Date: 11/19/2019   PT End of Session - 11/19/19 1243    Visit Number 5    Number of Visits 16    Date for PT Re-Evaluation 12/24/19    Authorization Type UHC MCD    Authorization - Number of Visits 27    PT Start Time 1050    PT Stop Time 1140    PT Time Calculation (min) 50 min    Activity Tolerance Patient limited by pain    Behavior During Therapy [facility] for tasks assessed/performed           Past Medical History:  Diagnosis Date  . Hypertension     Past Surgical History:  Procedure Laterality Date  . APPENDECTOMY    . EYE SURGERY    . HAND SURGERY    . KNEE SURGERY     x3, x1 R  . LIPOMA EXCISION Left 10/13/2019   Procedure: SHOULDER EXCISION LIPOMA AND ROTATOR CUFF REPAIR;  Surgeon: Tania Ade, MD;  Location: [place] [facility];  Service: Orthopedics;  Laterality: Left;  . TONSILLECTOMY      There were no vitals filed for this visit.   Subjective Assessment - 11/19/19 1054    Subjective Sees Dr. Tamera Punt 10/29.  Cold is killing me, 7/10.    Currently in Pain? Yes    Pain Score 7     Pain Location Shoulder    Pain Orientation Left;Anterior    Pain Descriptors / Indicators Aching    Pain Type Surgical pain    Pain Onset More than a month ago    Pain Frequency Intermittent    Aggravating Factors  cold weather, sleeping on it    Pain Relieving Factors rest    Effect of Pain on Daily Activities pain with all activities    Multiple Pain Sites No               [facility] Adult PT Treatment/Exercise - 11/19/19 0001      Shoulder Exercises: Seated   Retraction Weight (lbs) x 10     Flexion Weight (lbs) table slides x 15     ABduction  Weight (lbs) table slide x 15 (scaption)       Shoulder Exercises: Sidelying   Flexion AAROM;Left;10 reps    Flexion Weight (lbs) 2 sets with PT assisting elbow       Shoulder Exercises: Pulleys   Flexion 2 minutes      Shoulder Exercises: ROM/Strengthening   Ranger circles, flexion small range due to pain       Moist Heat Therapy   Number Minutes Moist Heat 15 Minutes    Moist Heat Location Shoulder      Electrical Stimulation   Electrical Stimulation Location Rt shoulder     Electrical Stimulation Action IFC     Electrical Stimulation Parameters 24     Electrical Stimulation Goals Pain      Manual Therapy   Joint Mobilization inferior glides,P/A    Passive ROM all planes to tolerance                   PT Education - 11/19/19 1131    Education Details Protocol, IFC for pain control , MHP vs Ice  Person(s) Educated Patient    Methods Explanation;Demonstration    Comprehension Returned demonstration;Verbalized understanding            PT Short Term Goals - 11/11/19 0830      PT SHORT TERM GOAL #1   Title Pt will be I with HEP for PROM, posture and RICE    Baseline cues needed    Status On-going      PT SHORT TERM GOAL #2   Title Pt will be able to report no pain at rest, sitting, no sling as MD allows    Baseline has rare times when pain is absent    Status On-going      PT SHORT TERM GOAL #3   Title Pt will understand and adhere to protocol for shoulder activity    Baseline improved comprehension today    Status On-going             PT Long Term Goals - 10/29/19 2002      PT LONG TERM GOAL #1   Title Pt will be able to lift L arm overhead in standing to 120 deg or more for functional reach in his home    Baseline unable due to protocol    Time 8    Period Weeks    Status New    Target Date 12/24/19      PT LONG TERM GOAL #2   Title Pt will be able to complete ADLs, dressing and grooming with L UE use and without increase in shoulder  pain    Baseline uses Rt UE    Time 8    Period Weeks    Status New    Target Date 12/24/19      PT LONG TERM GOAL #3   Title Pt will be able to use L UE to hold item (close to body) while using Rt UE for fine motor activities    Baseline unable    Time 8    Period Weeks    Status New    Target Date 12/24/19      PT LONG TERM GOAL #4   Title Pt will be I with HEP for shoulder AROM, posture, strength    Baseline unknown    Time 8    Period Weeks    Status New    Target Date 12/24/19                 Plan - 11/19/19 1132    Clinical Impression Statement Patient limited by pain today, trial of IFC for pain control. Supported arm with AAROM in gravity eliminated position. Encouraged him to be patient with progress and follow protocol to ensure full recovery.           Patient will benefit from skilled therapeutic intervention in order to improve the following deficits and impairments:  Increased fascial restricitons, Pain, Postural dysfunction, Decreased mobility, Decreased range of motion, Decreased strength, Hypomobility, Impaired UE functional use, Impaired flexibility, Increased edema  Visit Diagnosis: Acute pain of left shoulder  Status post left rotator cuff repair  Localized edema  Muscle weakness (generalized)     Problem List Patient Active Problem List   Diagnosis Date Noted  . LVH (left ventricular hypertrophy) 03/06/2018  . Essential hypertension 03/06/2018  . HTN (hypertension), malignant 02/20/2018  . Chest pain 02/20/2018    Curtis Saunders 11/19/2019, 12:45 PM  [facility] [address] [place], [name], [address] Phone: [phone]   Fax:  [phone]  Name: Curtis Saunders MRN: [id number] Date of Birth: December 14, 1955  Raeford Razor, PT 11/19/19 12:45 PM Phone: [phone] Fax: [phone]

## 2019-11-24 ENCOUNTER — Encounter: Payer: Self-pay | Admitting: Physical Therapy

## 2019-11-24 ENCOUNTER — Other Ambulatory Visit: Payer: Self-pay

## 2019-11-24 ENCOUNTER — Ambulatory Visit: Payer: Medicaid Other | Attending: Orthopedic Surgery | Admitting: Physical Therapy

## 2019-11-24 DIAGNOSIS — M6281 Muscle weakness (generalized): Secondary | ICD-10-CM | POA: Diagnosis present

## 2019-11-24 DIAGNOSIS — M25512 Pain in left shoulder: Secondary | ICD-10-CM

## 2019-11-24 DIAGNOSIS — Z9889 Other specified postprocedural states: Secondary | ICD-10-CM | POA: Insufficient documentation

## 2019-11-24 DIAGNOSIS — R600 Localized edema: Secondary | ICD-10-CM

## 2019-11-24 NOTE — Patient Instructions (Signed)
[id number]

## 2019-11-24 NOTE — Therapy (Signed)
[place], [name], [address] Phone: [phone]   Fax:  [phone]  Physical Therapy Treatment  Patient Details  Name: Curtis Saunders MRN: [id number] Date of Birth: [birth year] Referring Provider (PT): Dr. Tania Ade    Encounter Date: 11/24/2019   PT End of Session - 11/24/19 1044    Visit Number 6    Number of Visits 16    Date for PT Re-Evaluation 12/24/19    Authorization Type UHC MCD    PT Start Time 0233    PT Stop Time 1048    PT Time Calculation (min) 46 min    Activity Tolerance Patient tolerated treatment well    Behavior During Therapy [facility] for tasks assessed/performed           Past Medical History:  Diagnosis Date  . Hypertension     Past Surgical History:  Procedure Laterality Date  . APPENDECTOMY    . EYE SURGERY    . HAND SURGERY    . KNEE SURGERY     x3, x1 R  . LIPOMA EXCISION Left 10/13/2019   Procedure: SHOULDER EXCISION LIPOMA AND ROTATOR CUFF REPAIR;  Surgeon: Tania Ade, MD;  Location: [place];  Service: Orthopedics;  Laterality: Left;  . TONSILLECTOMY      There were no vitals filed for this visit.   Subjective Assessment - 11/24/19 1005    Subjective No pian today, Saw the doctor and he said I'm doing good.  Did not give him any restrictions.  Got some pain meds.             [facility] Adult PT Treatment/Exercise - 11/24/19 0001      Shoulder Exercises: Supine   Other Supine Exercises supine wand exercises x 15 : chest press, overhead, horizontal add, abd and ER/IR    2 sets with cues    Other Supine Exercises retraction x 10       Shoulder Exercises: Seated   Retraction Weight (lbs) x 20     External Rotation Limitations seated table x 15 , cues       Shoulder Exercises: ROM/Strengthening   Ranger standing flexion and scaption x 15 level 29-34       Shoulder Exercises: Isometric Strengthening   Flexion Supine;5X5"     Extension Supine;5X5"    External Rotation Supine;5X5"    Internal Rotation Supine;5X5"    ABduction Supine;5X5"      Cryotherapy   Number Minutes Cryotherapy 6 Minutes    Cryotherapy Location Shoulder    Type of Cryotherapy Ice pack      Manual Therapy   Joint Mobilization inferior glides,P/A    Passive ROM all planes to tolerance                     PT Short Term Goals - 11/11/19 0830      PT SHORT TERM GOAL #1   Title Pt will be I with HEP for PROM, posture and RICE    Baseline cues needed    Status On-going      PT SHORT TERM GOAL #2   Title Pt will be able to report no pain at rest, sitting, no sling as MD allows    Baseline has rare times when pain is absent    Status On-going      PT SHORT TERM GOAL #3   Title Pt will understand and adhere to protocol for shoulder activity  Baseline improved comprehension today    Status On-going             PT Long Term Goals - 10/29/19 2002      PT LONG TERM GOAL #1   Title Pt will be able to lift L arm overhead in standing to 120 deg or more for functional reach in his home    Baseline unable due to protocol    Time 8    Period Weeks    Status New    Target Date 12/24/19      PT LONG TERM GOAL #2   Title Pt will be able to complete ADLs, dressing and grooming with L UE use and without increase in shoulder pain    Baseline uses Rt UE    Time 8    Period Weeks    Status New    Target Date 12/24/19      PT LONG TERM GOAL #3   Title Pt will be able to use L UE to hold item (close to body) while using Rt UE for fine motor activities    Baseline unable    Time 8    Period Weeks    Status New    Target Date 12/24/19      PT LONG TERM GOAL #4   Title Pt will be I with HEP for shoulder AROM, posture, strength    Baseline unknown    Time 8    Period Weeks    Status New    Target Date 12/24/19                 Plan - 11/24/19 1039    Clinical Impression Statement Patient tolerated exercise  and mnaual well, likely benefitting from Pain med.  Did not know the name of new med.  He was given AAROM HEP and encouraged to go slowly with with care, use ice post.    PT Treatment/Interventions ADLs/Self Care Home Management;Therapeutic activities;Patient/family education;Taping;Therapeutic exercise;Vasopneumatic Device;Passive range of motion;Manual techniques;Functional mobility training;Ultrasound;Cryotherapy;Moist Heat;Iontophoresis 4mg /ml Dexamethasone    PT Next Visit Plan HEP PROM, follow protocol- AAROM ok    PT Home Exercise Plan pendulum , PROM table slides, scapular retraction, upper trap stretch,[id number]    Consulted and Agree with Plan of Care Patient           Patient will benefit from skilled therapeutic intervention in order to improve the following deficits and impairments:  Increased fascial restricitons, Pain, Postural dysfunction, Decreased mobility, Decreased range of motion, Decreased strength, Hypomobility, Impaired UE functional use, Impaired flexibility, Increased edema  Visit Diagnosis: Acute pain of left shoulder  Status post left rotator cuff repair  Localized edema  Muscle weakness (generalized)     Problem List Patient Active Problem List   Diagnosis Date Noted  . LVH (left ventricular hypertrophy) 03/06/2018  . Essential hypertension 03/06/2018  . HTN (hypertension), malignant 02/20/2018  . Chest pain 02/20/2018    Curtis Saunders 11/24/2019, 10:48 AM  [facility] [address] [place], [name], [address] Phone: [phone]   Fax:  [phone]  Name: Curtis Saunders MRN: [id number] Date of Birth: 1955/06/04  Raeford Razor, PT 11/24/19 10:48 AM Phone: [phone] Fax: [phone]

## 2019-11-26 ENCOUNTER — Other Ambulatory Visit: Payer: Self-pay

## 2019-11-26 ENCOUNTER — Ambulatory Visit: Payer: Medicaid Other | Admitting: Physical Therapy

## 2019-11-26 DIAGNOSIS — Z9889 Other specified postprocedural states: Secondary | ICD-10-CM

## 2019-11-26 DIAGNOSIS — M25512 Pain in left shoulder: Secondary | ICD-10-CM | POA: Diagnosis not present

## 2019-11-26 DIAGNOSIS — M6281 Muscle weakness (generalized): Secondary | ICD-10-CM

## 2019-11-26 DIAGNOSIS — R600 Localized edema: Secondary | ICD-10-CM

## 2019-11-26 NOTE — Therapy (Signed)
[place], [name], [address] Phone: [phone]   Fax:  [phone]  Physical Therapy Treatment  Patient Details  Name: Curtis Saunders MRN: [id number] Date of Birth: 02/13/1955 Referring Provider (PT): Dr. Tania Ade    Encounter Date: 11/26/2019   PT End of Session - 11/26/19 1000    Visit Number 7    Number of Visits 16    Date for PT Re-Evaluation 12/24/19    Authorization Type UHC MCD    Authorization - Number of Visits 27    PT Start Time 0955    PT Stop Time 1040    PT Time Calculation (min) 45 min    Activity Tolerance Patient tolerated treatment well    Behavior During Therapy [facility] for tasks assessed/performed           Past Medical History:  Diagnosis Date   Hypertension     Past Surgical History:  Procedure Laterality Date   APPENDECTOMY     EYE SURGERY     HAND SURGERY     KNEE SURGERY     x3, x1 R   LIPOMA EXCISION Left 10/13/2019   Procedure: SHOULDER EXCISION LIPOMA AND ROTATOR CUFF REPAIR;  Surgeon: Tania Ade, MD;  Location: [place];  Service: Orthopedics;  Laterality: Left;   TONSILLECTOMY      There were no vitals filed for this visit.   Subjective Assessment - 11/26/19 0957    Subjective Didnt get any sleep last night.  taking Naproxen but it doesnt help.    Currently in Pain? Yes    Pain Score 7     Pain Location Shoulder                 [facility] Adult PT Treatment/Exercise - 11/26/19 0001      Shoulder Exercises: Supine   Other Supine Exercises supine wand exercises x 15 : chest press, overhead, horizontal add, abd and ER/IR    1 set      Shoulder Exercises: Standing   Extension Weight (lbs) small ROM yellow x 15     Row Weight (lbs) small ROM with retraction yellow x 15     Retraction Strengthening;10 reps    Retraction Weight (lbs) on wall     Other Standing Exercises wall slides bilateral, to 130 deg with paoin,  done x 5       Shoulder Exercises: Pulleys   Flexion 3 minutes    Flexion Limitations incr pain today       Shoulder Exercises: ROM/Strengthening   Ranger --      Shoulder Exercises: Isometric Strengthening   Flexion 5X5"    Extension 5X5"    External Rotation 5X5"    Internal Rotation 5X5"    ABduction 5X5"    ADduction 5X5"      Moist Heat Therapy   Number Minutes Moist Heat 10 Minutes    Moist Heat Location Shoulder      Manual Therapy   Manual therapy comments scar tissue mobilization     Joint Mobilization distraction    Myofascial Release soft tissue to L anterior and post shoulder    Passive ROM all planes                    PT Short Term Goals - 11/11/19 0830      PT SHORT TERM GOAL #1   Title Pt will be I with HEP for PROM, posture and RICE  Baseline cues needed    Status On-going      PT SHORT TERM GOAL #2   Title Pt will be able to report no pain at rest, sitting, no sling as MD allows    Baseline has rare times when pain is absent    Status On-going      PT SHORT TERM GOAL #3   Title Pt will understand and adhere to protocol for shoulder activity    Baseline improved comprehension today    Status On-going             PT Long Term Goals - 10/29/19 2002      PT LONG TERM GOAL #1   Title Pt will be able to lift L arm overhead in standing to 120 deg or more for functional reach in his home    Baseline unable due to protocol    Time 8    Period Weeks    Status New    Target Date 12/24/19      PT LONG TERM GOAL #2   Title Pt will be able to complete ADLs, dressing and grooming with L UE use and without increase in shoulder pain    Baseline uses Rt UE    Time 8    Period Weeks    Status New    Target Date 12/24/19      PT LONG TERM GOAL #3   Title Pt will be able to use L UE to hold item (close to body) while using Rt UE for fine motor activities    Baseline unable    Time 8    Period Weeks    Status New    Target Date  12/24/19      PT LONG TERM GOAL #4   Title Pt will be I with HEP for shoulder AROM, posture, strength    Baseline unknown    Time 8    Period Weeks    Status New    Target Date 12/24/19                 Plan - 11/26/19 1032    Clinical Impression Statement Patient with increaed pain today, soreness anteriorly.  He can position his arm at home to where he has no pain .  Works on ROM at home, knows HEP.    PT Treatment/Interventions ADLs/Self Care Home Management;Therapeutic activities;Patient/family education;Taping;Therapeutic exercise;Vasopneumatic Device;Passive range of motion;Manual techniques;Functional mobility training;Ultrasound;Cryotherapy;Moist Heat;Iontophoresis 4mg /ml Dexamethasone    PT Next Visit Plan HEP PROM, follow protocol- AAROM ok. add isometrics    PT Home Exercise Plan pendulum , PROM table slides, scapular retraction, upper trap stretch,[id number]    Consulted and Agree with Plan of Care Patient           Patient will benefit from skilled therapeutic intervention in order to improve the following deficits and impairments:  Increased fascial restricitons, Pain, Postural dysfunction, Decreased mobility, Decreased range of motion, Decreased strength, Hypomobility, Impaired UE functional use, Impaired flexibility, Increased edema  Visit Diagnosis: Acute pain of left shoulder  Status post left rotator cuff repair  Muscle weakness (generalized)  Localized edema     Problem List Patient Active Problem List   Diagnosis Date Noted   LVH (left ventricular hypertrophy) 03/06/2018   Essential hypertension 03/06/2018   HTN (hypertension), malignant 02/20/2018   Chest pain 02/20/2018    Josanne Boerema 11/26/2019, 10:42 AM  [facility] [address] [place], [name], [address] Phone: [phone]  Fax:  [phone]  Name: LUCIOUS ZOU MRN: [id number] Date of Birth: 20-Oct-1955  Raeford Razor,  PT 11/26/19 10:42 AM Phone: [phone] Fax: [phone]

## 2019-12-01 ENCOUNTER — Encounter: Payer: Self-pay | Admitting: Physical Therapy

## 2019-12-01 ENCOUNTER — Ambulatory Visit: Payer: Medicaid Other | Admitting: Physical Therapy

## 2019-12-01 ENCOUNTER — Other Ambulatory Visit: Payer: Self-pay

## 2019-12-01 DIAGNOSIS — M6281 Muscle weakness (generalized): Secondary | ICD-10-CM

## 2019-12-01 DIAGNOSIS — Z9889 Other specified postprocedural states: Secondary | ICD-10-CM

## 2019-12-01 DIAGNOSIS — M25512 Pain in left shoulder: Secondary | ICD-10-CM | POA: Diagnosis not present

## 2019-12-01 DIAGNOSIS — R600 Localized edema: Secondary | ICD-10-CM

## 2019-12-01 NOTE — Therapy (Signed)
[place], [name], [address] Phone: [phone]   Fax:  [phone]  Physical Therapy Treatment  Patient Details  Name: Curtis Saunders MRN: [id number] Date of Birth: 1955-07-02 Referring Provider (PT): Dr. Tania Ade    Encounter Date: 12/01/2019   PT End of Session - 12/01/19 1051    Visit Number 8    Number of Visits 16    Date for PT Re-Evaluation 12/24/19    Authorization Type UHC MCD    Authorization - Number of Visits 27    PT Start Time 1000    PT Stop Time 1040    PT Time Calculation (min) 40 min    Activity Tolerance Patient tolerated treatment well           Past Medical History:  Diagnosis Date  . Hypertension     Past Surgical History:  Procedure Laterality Date  . APPENDECTOMY    . EYE SURGERY    . HAND SURGERY    . KNEE SURGERY     x3, x1 R  . LIPOMA EXCISION Left 10/13/2019   Procedure: SHOULDER EXCISION LIPOMA AND ROTATOR CUFF REPAIR;  Surgeon: Tania Ade, MD;  Location: [place];  Service: Orthopedics;  Laterality: Left;  . TONSILLECTOMY      There were no vitals filed for this visit.   Subjective Assessment - 12/01/19 1014    Subjective My shoulder is so tight right now.  Pain all weekend.  Meds not working.    Currently in Pain? Yes    Pain Score 7     Pain Location Shoulder    Pain Orientation Left    Pain Descriptors / Indicators Aching;Tightness    Pain Type Surgical pain    Pain Onset More than a month ago    Pain Frequency Intermittent    Aggravating Factors  cold, sleeping, using it    Pain Relieving Factors rest    Multiple Pain Sites No                 [facility] Adult PT Treatment/Exercise - 12/01/19 0001      Shoulder Exercises: Supine   Other Supine Exercises supine wand exercises x 15 : chest press, overhead, horizontal add, abd and ER/IR    1 set      Shoulder Exercises: Seated   External Rotation  AROM;Strengthening;Left;10 reps    External Rotation Weight (lbs) elbow propped     Internal Rotation AROM;Strengthening;Left;10 reps    Internal Rotation Weight (lbs) elbow propped       Shoulder Exercises: Pulleys   Flexion 2 minutes    Scaption 2 minutes    Other Pulley Exercises IR  1 min       Shoulder Exercises: Therapy Ball   Scaption Limitations x 30     Other Therapy Ball Exercises circles UE x 30    Other Therapy Ball Exercises elbows/forearms on ball, press out x 20       Shoulder Exercises: Stretch   Table Stretch - Flexion 10 seconds    Table Stretch -Flexion Limitations x 10     Table Stretch - Abduction 10 seconds    Table Stretch - ABduction Limitations x 10     Table Stretch - External Rotation 5 reps;10 seconds    Table Stretch - External Rotation Limitations x 10       Manual Therapy   Joint Mobilization inferior capsule stretch     Passive ROM  all planes                    PT Short Term Goals - 12/01/19 1015      PT SHORT TERM GOAL #1   Title Pt will be I with HEP for PROM, posture and RICE    Status On-going      PT SHORT TERM GOAL #2   Title Pt will be able to report no pain at rest, sitting, no sling as MD allows    Baseline in AM, feels the best    Status On-going      PT SHORT TERM GOAL #3   Title Pt will understand and adhere to protocol for shoulder activity    Status On-going             PT Long Term Goals - 10/29/19 2002      PT LONG TERM GOAL #1   Title Pt will be able to lift L arm overhead in standing to 120 deg or more for functional reach in his home    Baseline unable due to protocol    Time 8    Period Weeks    Status New    Target Date 12/24/19      PT LONG TERM GOAL #2   Title Pt will be able to complete ADLs, dressing and grooming with L UE use and without increase in shoulder pain    Baseline uses Rt UE    Time 8    Period Weeks    Status New    Target Date 12/24/19      PT LONG TERM GOAL #3   Title  Pt will be able to use L UE to hold item (close to body) while using Rt UE for fine motor activities    Baseline unable    Time 8    Period Weeks    Status New    Target Date 12/24/19      PT LONG TERM GOAL #4   Title Pt will be I with HEP for shoulder AROM, posture, strength    Baseline unknown    Time 8    Period Weeks    Status New    Target Date 12/24/19                 Plan - 12/01/19 1037    Clinical Impression Statement Focus on light strengthening and AAROM of L UE in various posiitons, including standing.  Showed good functional reach to L2 with pulleys today.    PT Treatment/Interventions ADLs/Self Care Home Management;Therapeutic activities;Patient/family education;Taping;Therapeutic exercise;Vasopneumatic Device;Passive range of motion;Manual techniques;Functional mobility training;Ultrasound;Cryotherapy;Moist Heat;Iontophoresis 4mg /ml Dexamethasone    PT Next Visit Plan add in more strengthening to scapular stab and AROM , advance into week 8    PT Home Exercise Plan pendulum , PROM table slides, scapular retraction, upper trap stretch,[id number]    Consulted and Agree with Plan of Care Patient           Patient will benefit from skilled therapeutic intervention in order to improve the following deficits and impairments:  Increased fascial restricitons, Pain, Postural dysfunction, Decreased mobility, Decreased range of motion, Decreased strength, Hypomobility, Impaired UE functional use, Impaired flexibility, Increased edema  Visit Diagnosis: Acute pain of left shoulder  Status post left rotator cuff repair  Muscle weakness (generalized)  Localized edema     Problem List Patient Active Problem List   Diagnosis Date Noted  . LVH (left ventricular hypertrophy) 03/06/2018  .  Essential hypertension 03/06/2018  . HTN (hypertension), malignant 02/20/2018  . Chest pain 02/20/2018    Lasandra Batley 12/01/2019, 10:54 AM  [facility] [address] [place], [name], [address] Phone: [phone]   Fax:  [phone]  Name: Curtis Saunders MRN: [id number] Date of Birth: Jul 11, 1955  Raeford Razor, PT 12/01/19 10:54 AM Phone: [phone] Fax: [phone]

## 2019-12-03 ENCOUNTER — Ambulatory Visit: Payer: Medicaid Other | Admitting: Physical Therapy

## 2019-12-03 ENCOUNTER — Encounter: Payer: Self-pay | Admitting: Physical Therapy

## 2019-12-03 ENCOUNTER — Other Ambulatory Visit: Payer: Self-pay

## 2019-12-03 DIAGNOSIS — R600 Localized edema: Secondary | ICD-10-CM

## 2019-12-03 DIAGNOSIS — Z9889 Other specified postprocedural states: Secondary | ICD-10-CM

## 2019-12-03 DIAGNOSIS — M25512 Pain in left shoulder: Secondary | ICD-10-CM

## 2019-12-03 DIAGNOSIS — M6281 Muscle weakness (generalized): Secondary | ICD-10-CM

## 2019-12-03 NOTE — Therapy (Addendum)
[place], [name], [address] Phone: [phone]   Fax:  [phone]  Physical Therapy Treatment  Patient Details  Name: Curtis Saunders MRN: [id number] Date of Birth: 1955-06-14 Referring Provider (PT): Dr. Tania Ade    Encounter Date: 12/03/2019   PT End of Session - 12/03/19 1010    Visit Number 9    Number of Visits 16    Date for PT Re-Evaluation 12/24/19    Authorization Type UHC MCD    Authorization - Number of Visits 27    PT Start Time 1000    PT Stop Time 1041    PT Time Calculation (min) 41 min    Activity Tolerance Patient tolerated treatment well    Behavior During Therapy [facility] for tasks assessed/performed           Past Medical History:  Diagnosis Date  . Hypertension     Past Surgical History:  Procedure Laterality Date  . APPENDECTOMY    . EYE SURGERY    . HAND SURGERY    . KNEE SURGERY     x3, x1 R  . LIPOMA EXCISION Left 10/13/2019   Procedure: SHOULDER EXCISION LIPOMA AND ROTATOR CUFF REPAIR;  Surgeon: Tania Ade, MD;  Location: [place];  Service: Orthopedics;  Laterality: Left;  . TONSILLECTOMY      There were no vitals filed for this visit.   Subjective Assessment - 12/03/19 1008    Subjective Took meds last night.  I have no pain right now.    Currently in Pain? No/denies                 [facility] Adult PT Treatment/Exercise - 12/03/19 0001      Shoulder Exercises: Supine   Horizontal ABduction Strengthening    Theraband Level (Shoulder Horizontal ABduction) Level 1 (Yellow)    External Rotation Strengthening;Both;15 reps    Theraband Level (Shoulder External Rotation) Level 1 (Yellow)      Shoulder Exercises: Standing   Horizontal ABduction Strengthening;Both;15 reps    Theraband Level (Shoulder Horizontal ABduction) Level 1 (Yellow)    External Rotation Strengthening;Left;15 reps    Theraband Level (Shoulder External  Rotation) Level 2 (Red)    Internal Rotation Strengthening;Left;15 reps    Theraband Level (Shoulder Internal Rotation) Level 2 (Red)    Shoulder Flexion Weight (lbs) x 10       Shoulder Exercises: Pulleys   Flexion 2 minutes    Scaption 2 minutes    Other Pulley Exercises IR  1 min       Shoulder Exercises: ROM/Strengthening   UBE (Upper Arm Bike) forward no tension 5 min       Shoulder Exercises: Stretch   Other Shoulder Stretches single arm doorway gentle x 5     Other Shoulder Stretches wall ladder to top rung added light "hang" at the top    x 5      Manual Therapy   Joint Mobilization inferior capsule stretch     Passive ROM all planes with joint distraction                     PT Short Term Goals - 12/01/19 1015      PT SHORT TERM GOAL #1   Title Pt will be I with HEP for PROM, posture and RICE    Status On-going      PT SHORT TERM GOAL #2   Title Pt will be  able to report no pain at rest, sitting, no sling as MD allows    Baseline in AM, feels the best    Status On-going      PT SHORT TERM GOAL #3   Title Pt will understand and adhere to protocol for shoulder activity    Status On-going             PT Long Term Goals - 10/29/19 2002      PT LONG TERM GOAL #1   Title Pt will be able to lift L arm overhead in standing to 120 deg or more for functional reach in his home    Baseline unable due to protocol    Time 8    Period Weeks    Status New    Target Date 12/24/19      PT LONG TERM GOAL #2   Title Pt will be able to complete ADLs, dressing and grooming with L UE use and without increase in shoulder pain    Baseline uses Rt UE    Time 8    Period Weeks    Status New    Target Date 12/24/19      PT LONG TERM GOAL #3   Title Pt will be able to use L UE to hold item (close to body) while using Rt UE for fine motor activities    Baseline unable    Time 8    Period Weeks    Status New    Target Date 12/24/19      PT LONG TERM GOAL #4    Title Pt will be I with HEP for shoulder AROM, posture, strength    Baseline unknown    Time 8    Period Weeks    Status New    Target Date 12/24/19                 Plan - 12/03/19 1010    Clinical Impression Statement Patient improving and tolerating increased ROM and strength challenges. He had little to no pain today.  Asked to make 4 more weeks of appts to address strenght deficits.    PT Treatment/Interventions ADLs/Self Care Home Management;Therapeutic activities;Patient/family education;Taping;Therapeutic exercise;Vasopneumatic Device;Passive range of motion;Manual techniques;Functional mobility training;Ultrasound;Cryotherapy;Moist Heat;Iontophoresis 4mg /ml Dexamethasone    PT Next Visit Plan add in more strengthening to scapular stab and AROM , advance into week 8    PT Home Exercise Plan pendulum , PROM table slides, scapular retraction, upper trap stretch,[id number]    Consulted and Agree with Plan of Care Patient           Patient will benefit from skilled therapeutic intervention in order to improve the following deficits and impairments:  Increased fascial restricitons, Pain, Postural dysfunction, Decreased mobility, Decreased range of motion, Decreased strength, Hypomobility, Impaired UE functional use, Impaired flexibility, Increased edema  Visit Diagnosis: Acute pain of left shoulder  Status post left rotator cuff repair  Muscle weakness (generalized)  Localized edema     Problem List Patient Active Problem List   Diagnosis Date Noted  . LVH (left ventricular hypertrophy) 03/06/2018  . Essential hypertension 03/06/2018  . HTN (hypertension), malignant 02/20/2018  . Chest pain 02/20/2018    Ellyssa Zagal 12/03/2019, 12:52 PM  [facility] [address] [place], [name], [address] Phone: [phone]   Fax:  [phone]  Name: Curtis Saunders MRN: [id number] Date of Birth: 08/12/1955  Raeford Razor, PT 12/03/19 12:52 PM Phone: [phone] Fax: [phone]

## 2019-12-22 ENCOUNTER — Other Ambulatory Visit: Payer: Self-pay | Admitting: Cardiology

## 2019-12-22 MED ORDER — AMLODIPINE BESYLATE 10 MG PO TABS
10.0000 mg | ORAL_TABLET | Freq: Every day | ORAL | 1 refills | Status: DC
Start: 2019-12-22 — End: 2020-02-02

## 2019-12-22 MED ORDER — RAMIPRIL 10 MG PO CAPS: 10.0000 mg | ORAL_CAPSULE | Freq: Every day | ORAL | 1 refills | Status: DC

## 2019-12-22 NOTE — Telephone Encounter (Signed)
*  STAT* If patient is at the pharmacy, call can be transferred to refill team.   1. Which medications need to be refilled? (please list name of each medication and dose if known)   ramipril (ALTACE) 10 MG capsule(Expired)  amLODipine (NORVASC) 10 MG tablet  2. Which pharmacy/location (including street and city if local pharmacy) is medication to be sent to? [place] ([place]), [place] - [address]  3. Do they need a 30 day or 90 day supply? 90 day supply   Patient is out of medication

## 2019-12-22 NOTE — Telephone Encounter (Signed)
    Pt is calling back to follow up, he said he needs refill today

## 2019-12-23 ENCOUNTER — Ambulatory Visit: Payer: Medicaid Other | Admitting: Physical Therapy

## 2019-12-23 ENCOUNTER — Other Ambulatory Visit: Payer: Self-pay

## 2019-12-23 ENCOUNTER — Encounter: Payer: Self-pay | Admitting: Physical Therapy

## 2019-12-23 DIAGNOSIS — Z9889 Other specified postprocedural states: Secondary | ICD-10-CM

## 2019-12-23 DIAGNOSIS — M25512 Pain in left shoulder: Secondary | ICD-10-CM | POA: Diagnosis not present

## 2019-12-23 DIAGNOSIS — M6281 Muscle weakness (generalized): Secondary | ICD-10-CM

## 2019-12-23 DIAGNOSIS — R600 Localized edema: Secondary | ICD-10-CM

## 2019-12-23 NOTE — Therapy (Addendum)
[place], [name], [address] Phone: [phone]   Fax:  [phone]  Physical Therapy Treatment/Renewal   Patient Details  Name: Curtis Saunders MRN: [id number] Date of Birth: 1955-03-30 Referring Provider (PT): Dr. Tania Ade    Encounter Date: 12/23/2019   PT End of Session - 12/23/19 1107    Visit Number 10    Number of Visits 16    Date for PT Re-Evaluation 01/23/20    Authorization Type UHC MCD    Authorization - Number of Visits 27    PT Start Time 1102    PT Stop Time 1145    PT Time Calculation (min) 43 min    Activity Tolerance Patient tolerated treatment well    Behavior During Therapy [facility] for tasks assessed/performed           Past Medical History:  Diagnosis Date  . Hypertension     Past Surgical History:  Procedure Laterality Date  . APPENDECTOMY    . EYE SURGERY    . HAND SURGERY    . KNEE SURGERY     x3, x1 R  . LIPOMA EXCISION Left 10/13/2019   Procedure: SHOULDER EXCISION LIPOMA AND ROTATOR CUFF REPAIR;  Surgeon: Tania Ade, MD;  Location: [place];  Service: Orthopedics;  Laterality: Left;  . TONSILLECTOMY      There were no vitals filed for this visit.   Subjective Assessment - 12/23/19 1105    Subjective The shoulder has been hurting more since I have not been here to physical therapy.    Currently in Pain? Yes    Pain Score 7     Pain Location Nose    Pain Orientation Left    Pain Descriptors / Indicators Aching;Tightness    Pain Type Surgical pain    Aggravating Factors  laying on it    Pain Relieving Factors rest, meds                             [facility] Adult PT Treatment/Exercise - 12/24/19 0001      Shoulder Exercises: Supine   Horizontal ABduction Strengthening    Theraband Level (Shoulder Horizontal ABduction) Level 2 (Red)    External Rotation Strengthening;Both;15 reps    Theraband Level (Shoulder  External Rotation) Level 2 (Red)    Other Supine Exercises supine wand exercises x 15 : chest press, overhead, horizontal add, abd and ER/IR    1 set      Shoulder Exercises: Standing   External Rotation Strengthening;Left;15 reps    Theraband Level (Shoulder External Rotation) Level 2 (Red)    Internal Rotation Strengthening;Left;15 reps    Theraband Level (Shoulder Internal Rotation) Level 2 (Red)    Row Weight (lbs) small ROM with retraction yellow x 15       Shoulder Exercises: Pulleys   Flexion 2 minutes    Scaption 2 minutes      Shoulder Exercises: ROM/Strengthening   UBE (Upper Arm Bike) forward no tension 5 min       Shoulder Exercises: Isometric Strengthening   External Rotation 5X5"    Internal Rotation 5X5"      Manual Therapy   Myofascial Release soft tissue to L anterior and post, lateral  shoulder    Passive ROM all planes with joint distraction  PT Short Term Goals - 12/01/19 1015      PT SHORT TERM GOAL #1   Title Pt will be I with HEP for PROM, posture and RICE    Status On-going      PT SHORT TERM GOAL #2   Title Pt will be able to report no pain at rest, sitting, no sling as MD allows    Baseline in AM, feels the best    Status On-going      PT SHORT TERM GOAL #3   Title Pt will understand and adhere to protocol for shoulder activity    Status On-going             PT Long Term Goals - 12/24/19 1019      PT LONG TERM GOAL #1   Title Pt will be able to lift L arm overhead in standing to 120 deg or more for functional reach in his home    Status On-going      PT LONG TERM GOAL #2   Title Pt will be able to complete ADLs, dressing and grooming with L UE use and without increase in shoulder pain    Status On-going      PT LONG TERM GOAL #3   Title Pt will be able to use L UE to hold item (close to body) while using Rt UE for fine motor activities    Status On-going      PT LONG TERM GOAL #4   Title Pt will be I  with HEP for shoulder AROM, posture, strength    Status On-going                 Plan - 12/23/19 1235    Clinical Impression Statement Pt reports pain he attributes to not having PT appointments over the lat 2 weeks. His ROM is about the same. Continued with ROM and gentle strengthening exercises as tolerated. Reviewed ER doorway stretch. STW performed to shoulder to decrease tension, multiple areas of thckness softened at anterior.lateral shoulder.  He declined modalities.    PT Next Visit Plan add in more strengthening to scapular stab and AROM , advance into week 8    PT Home Exercise Plan pendulum , PROM table slides, scapular retraction, upper trap stretch,[id number]           Patient will benefit from skilled therapeutic intervention in order to improve the following deficits and impairments:  Increased fascial restricitons, Pain, Postural dysfunction, Decreased mobility, Decreased range of motion, Decreased strength, Hypomobility, Impaired UE functional use, Impaired flexibility, Increased edema  Visit Diagnosis: Acute pain of left shoulder  Status post left rotator cuff repair  Muscle weakness (generalized)  Localized edema     Problem List Patient Active Problem List   Diagnosis Date Noted  . LVH (left ventricular hypertrophy) 03/06/2018  . Essential hypertension 03/06/2018  . HTN (hypertension), malignant 02/20/2018  . Chest pain 02/20/2018    PAA,JENNIFER, PTA 12/24/2019, 10:22 AM  [facility] [address] [place], [name], [address] Phone: [phone]   Fax:  [phone]  Name: Curtis Saunders MRN: [id number] Date of Birth: 1955-08-20  Raeford Razor, PT 12/24/19 10:23 AM Phone: [phone] Fax: [phone]

## 2019-12-24 NOTE — Addendum Note (Signed)
Addended by: Raeford Razor L on: 12/24/2019 10:24 AM   Modules accepted: Orders

## 2019-12-25 ENCOUNTER — Ambulatory Visit: Payer: Medicaid Other | Attending: Orthopedic Surgery | Admitting: Physical Therapy

## 2019-12-25 ENCOUNTER — Encounter: Payer: Self-pay | Admitting: Physical Therapy

## 2019-12-25 ENCOUNTER — Other Ambulatory Visit: Payer: Self-pay

## 2019-12-25 DIAGNOSIS — M6281 Muscle weakness (generalized): Secondary | ICD-10-CM

## 2019-12-25 DIAGNOSIS — R600 Localized edema: Secondary | ICD-10-CM

## 2019-12-25 DIAGNOSIS — M25512 Pain in left shoulder: Secondary | ICD-10-CM

## 2019-12-25 DIAGNOSIS — Z9889 Other specified postprocedural states: Secondary | ICD-10-CM | POA: Diagnosis present

## 2019-12-25 NOTE — Therapy (Signed)
[place], [name], [address] Phone: [phone]   Fax:  [phone]  Physical Therapy Treatment  Patient Details  Name: Curtis Saunders MRN: [id number] Date of Birth: 07-Sep-1955 Referring Provider (PT): Dr. Tania Ade    Encounter Date: 12/25/2019   PT End of Session - 12/25/19 1021    Visit Number 11    Number of Visits 16    Date for PT Re-Evaluation 01/23/20    Authorization Type UHC MCD    Authorization - Number of Visits 27    PT Start Time 4765    PT Stop Time 1100    PT Time Calculation (min) 45 min           Past Medical History:  Diagnosis Date  . Hypertension     Past Surgical History:  Procedure Laterality Date  . APPENDECTOMY    . EYE SURGERY    . HAND SURGERY    . KNEE SURGERY     x3, x1 R  . LIPOMA EXCISION Left 10/13/2019   Procedure: SHOULDER EXCISION LIPOMA AND ROTATOR CUFF REPAIR;  Surgeon: Tania Ade, MD;  Location: [place];  Service: Orthopedics;  Laterality: Left;  . TONSILLECTOMY      There were no vitals filed for this visit.   Subjective Assessment - 12/25/19 1021    Subjective I was okay after last session. No pain today.    Currently in Pain? No/denies                             [facility] Adult PT Treatment/Exercise - 12/25/19 0001      Shoulder Exercises: Supine   Other Supine Exercises supine wand exercises x 15 : chest press, overhead, horizontal add, abd and ER/IR    1 set      Shoulder Exercises: Standing   External Rotation Strengthening;Left;15 reps    Theraband Level (Shoulder External Rotation) Level 2 (Red)    Internal Rotation Strengthening;Left;15 reps    Theraband Level (Shoulder Internal Rotation) Level 2 (Red)    Other Standing Exercises standing cane flexion x 10 , scaption x 10, ER x 10       Shoulder Exercises: Pulleys   Flexion 2 minutes    Scaption 2 minutes      Shoulder Exercises:  ROM/Strengthening   UBE (Upper Arm Bike) forward no tension 5 min       Shoulder Exercises: Isometric Strengthening   Flexion 5X5"      Shoulder Exercises: Stretch   Table Stretch - Flexion 10 seconds    Table Stretch -Flexion Limitations x 10     Table Stretch - Abduction 2 reps    Table Stretch - ABduction Limitations too painful     Table Stretch - External Rotation 5 reps;10 seconds    Table Stretch - External Rotation Limitations x 10    Other Shoulder Stretches single arm doorway gentle x 5     Other Shoulder Stretches --      Manual Therapy   Myofascial Release soft tissue to L anterior and post, lateral  shoulder    Passive ROM all planes with joint distraction                     PT Short Term Goals - 12/01/19 1015      PT SHORT TERM GOAL #1   Title Pt will be I with HEP  for PROM, posture and RICE    Status On-going      PT SHORT TERM GOAL #2   Title Pt will be able to report no pain at rest, sitting, no sling as MD allows    Baseline in AM, feels the best    Status On-going      PT SHORT TERM GOAL #3   Title Pt will understand and adhere to protocol for shoulder activity    Status On-going             PT Long Term Goals - 12/24/19 1019      PT LONG TERM GOAL #1   Title Pt will be able to lift L arm overhead in standing to 120 deg or more for functional reach in his home    Status On-going      PT LONG TERM GOAL #2   Title Pt will be able to complete ADLs, dressing and grooming with L UE use and without increase in shoulder pain    Status On-going      PT LONG TERM GOAL #3   Title Pt will be able to use L UE to hold item (close to body) while using Rt UE for fine motor activities    Status On-going      PT LONG TERM GOAL #4   Title Pt will be I with HEP for shoulder AROM, posture, strength    Status On-going                 Plan - 12/25/19 1125    Clinical Impression Statement Pt arrives with no pain, previously 7/10. Continnued  with AAROM and gentle strengthening. Pain easily exacerbated with therex. Time spent with STW to shoulder followed by PROM. he has pain with all end range stretching.    PT Next Visit Plan add in more strengthening to scapular stab and AROM , advance into week 8    PT Home Exercise Plan pendulum , PROM table slides, scapular retraction, upper trap stretch,[id number]           Patient will benefit from skilled therapeutic intervention in order to improve the following deficits and impairments:  Increased fascial restricitons, Pain, Postural dysfunction, Decreased mobility, Decreased range of motion, Decreased strength, Hypomobility, Impaired UE functional use, Impaired flexibility, Increased edema  Visit Diagnosis: Acute pain of left shoulder  Status post left rotator cuff repair  Muscle weakness (generalized)  Localized edema     Problem List Patient Active Problem List   Diagnosis Date Noted  . LVH (left ventricular hypertrophy) 03/06/2018  . Essential hypertension 03/06/2018  . HTN (hypertension), malignant 02/20/2018  . Chest pain 02/20/2018    Curtis Saunders, [place] 12/25/2019, 11:29 AM  [place], [name], [address] Phone: [phone]   Fax:  [phone]  Name: Curtis Saunders MRN: [id number] Date of Birth: 12-27-1955

## 2019-12-25 NOTE — Therapy (Deleted)
[place], [name], [address] Phone: [phone]   Fax:  [phone]  December 25, 2019   No Recipients  Physical Therapy Discharge Summary  Patient: Curtis Saunders  MRN: [id number]  Date of Birth: 1955-09-30   Diagnosis: Acute pain of left shoulder  Status post left rotator cuff repair  Muscle weakness (generalized)  Localized edema Referring Provider (PT): Dr. Tania Ade    The above patient had been seen in Physical Therapy *** times of *** treatments scheduled with *** no shows and *** cancellations.  The treatment consisted of *** The patient is: {improved/worse/unchanged:[id number]}  Subjective: ***  Discharge Findings: ***  Functional Status at Discharge: ***  {[id number]}    Sincerely,   Hyacinth Meeker Dereck Leep, PTA   CC No Recipients  [place], [name], [address] Phone: [phone]   Fax:  [phone]  Patient: Curtis Saunders  MRN: [id number]  Date of Birth: 02-15-1955

## 2019-12-29 ENCOUNTER — Other Ambulatory Visit: Payer: Self-pay

## 2019-12-29 ENCOUNTER — Ambulatory Visit: Payer: Medicaid Other | Admitting: Physical Therapy

## 2019-12-29 DIAGNOSIS — M25512 Pain in left shoulder: Secondary | ICD-10-CM | POA: Diagnosis not present

## 2019-12-29 DIAGNOSIS — R600 Localized edema: Secondary | ICD-10-CM

## 2019-12-29 DIAGNOSIS — Z9889 Other specified postprocedural states: Secondary | ICD-10-CM

## 2019-12-29 DIAGNOSIS — M6281 Muscle weakness (generalized): Secondary | ICD-10-CM

## 2019-12-29 NOTE — Therapy (Signed)
[place], [name], [address] Phone: [phone]   Fax:  [phone]  Physical Therapy Treatment  Patient Details  Name: Curtis Saunders MRN: [id number] Date of Birth: [birth year] Referring Provider (PT): Dr. Tania Ade    Encounter Date: 12/29/2019   PT End of Session - 12/29/19 1017    Visit Number 12    Number of Visits 16    Date for PT Re-Evaluation 01/23/20    Authorization Type UHC MCD    Authorization - Number of Visits 27    PT Start Time 1004    PT Stop Time 1048    PT Time Calculation (min) 44 min    Activity Tolerance Patient tolerated treatment well    Behavior During Therapy [facility] for tasks assessed/performed           Past Medical History:  Diagnosis Date  . Hypertension     Past Surgical History:  Procedure Laterality Date  . APPENDECTOMY    . EYE SURGERY    . HAND SURGERY    . KNEE SURGERY     x3, x1 R  . LIPOMA EXCISION Left 10/13/2019   Procedure: SHOULDER EXCISION LIPOMA AND ROTATOR CUFF REPAIR;  Surgeon: Tania Ade, MD;  Location: [place];  Service: Orthopedics;  Laterality: Left;  . TONSILLECTOMY      There were no vitals filed for this visit.   Subjective Assessment - 12/29/19 1045    Subjective No pain today.  Its getting there.  Reports that when he leaves his pain does not linger.  He was able to use his L arm for working on his car this weekend.  He is "both " handed.    Currently in Pain? No/denies              [facility] PT Assessment - 12/29/19 0001      AROM   Left Shoulder Flexion 80 Degrees    Left Shoulder ABduction 85 Degrees    Left Shoulder Internal Rotation --   FR to L5    Left Shoulder External Rotation --   FR to back of head l side      Strength   Left Shoulder Flexion 3-/5    Left Shoulder ABduction 3-/5    Left Elbow Flexion 5/5            [facility] Adult PT Treatment/Exercise - 12/29/19 0001      Shoulder  Exercises: Supine   Protraction Weight (lbs) retraction x 10 on 1/2 foam roller       Shoulder Exercises: Standing   External Rotation Weight (lbs) looped band isometric x 10 on wall     Flexion Strengthening;Left;15 reps;Theraband    Theraband Level (Shoulder Flexion) Level 2 (Red)    Shoulder Flexion Weight (lbs) elevation     Extension Weight (lbs) 2 sets x 15 green band palms FW and then back     Other Standing Exercises looped band (red) chest press and overhead zx 10     Other Standing Exercises standing UE ranger x 10 flexion       Shoulder Exercises: ROM/Strengthening   UBE (Upper Arm Bike) L 1 , 3 min forward and 3 min back     Ranger flexion and then added cross body reaching and diagonal     Other ROM/Strengthening Exercises wall for AAROM flexion, reaching .  Painful to do standin corner stretch       Manual Therapy  Manual therapy comments prolonged static stretch in abductoin and external     Passive ROM all planes with joint distraction                   PT Education - 12/29/19 1047    Education Details bands , stretching daily    Person(s) Educated Patient    Methods Explanation    Comprehension Verbalized understanding            PT Short Term Goals - 12/29/19 1018      PT SHORT TERM GOAL #1   Title Pt will be I with HEP for PROM, posture and RICE    Status Achieved      PT SHORT TERM GOAL #2   Title Pt will be able to report no pain at rest, sitting, no sling as MD allows    Baseline sometimes has pain but usually none    Status Partially Met      PT SHORT TERM GOAL #3   Title Pt will understand and adhere to protocol for shoulder activity    Status Achieved             PT Long Term Goals - 12/29/19 1019      PT LONG TERM GOAL #1   Title Pt will be able to lift L arm overhead in standing to 120 deg or more for functional reach in his home    Status On-going      PT LONG TERM GOAL #2   Title Pt will be able to complete ADLs,  dressing and grooming with L UE use and without increase in shoulder pain    Baseline uses Rt UE    Status On-going      PT LONG TERM GOAL #3   Title Pt will be able to use L UE to hold item (close to body) while using Rt UE for fine motor activities    Status Achieved      PT LONG TERM GOAL #4   Title Pt will be I with HEP for shoulder AROM, posture, strength    Status On-going                 Plan - 12/29/19 1244    Clinical Impression Statement Patient quick to have pain increase with AROM, exercising and stretching but quickly recovers.  He is improving in AROM but compensates a great deal. Addressed upper back extension to improve shoulder mobility.  Subjectively reporting ability to use L UE with less pain overall .    PT Treatment/Interventions ADLs/Self Care Home Management;Therapeutic activities;Patient/family education;Taping;Therapeutic exercise;Vasopneumatic Device;Passive range of motion;Manual techniques;Functional mobility training;Ultrasound;Cryotherapy;Moist Heat;Iontophoresis 4mg /ml Dexamethasone    PT Next Visit Plan add to HEP , strengthen within ROM , cont manual    PT Home Exercise Plan pendulum , PROM table slides, scapular retraction, upper trap stretch,[id number]    Consulted and Agree with Plan of Care Patient           Patient will benefit from skilled therapeutic intervention in order to improve the following deficits and impairments:  Increased fascial restricitons, Pain, Postural dysfunction, Decreased mobility, Decreased range of motion, Decreased strength, Hypomobility, Impaired UE functional use, Impaired flexibility, Increased edema  Visit Diagnosis: Acute pain of left shoulder  Status post left rotator cuff repair  Muscle weakness (generalized)  Localized edema     Problem List Patient Active Problem List   Diagnosis Date Noted  . LVH (left ventricular hypertrophy) 03/06/2018  . Essential  hypertension 03/06/2018  . HTN  (hypertension), malignant 02/20/2018  . Chest pain 02/20/2018    Dotti Busey 12/29/2019, 12:50 PM  [facility] [address] [place], [name], [address] Phone: [phone]   Fax:  [phone]  Name: Curtis Saunders MRN: [id number] Date of Birth: Mar 27, 1955  Raeford Razor, PT 12/29/19 12:50 PM Phone: [phone] Fax: [phone]

## 2019-12-31 ENCOUNTER — Other Ambulatory Visit: Payer: Self-pay

## 2019-12-31 ENCOUNTER — Ambulatory Visit: Payer: Medicaid Other | Admitting: Physical Therapy

## 2019-12-31 ENCOUNTER — Encounter: Payer: Self-pay | Admitting: Physical Therapy

## 2019-12-31 DIAGNOSIS — M6281 Muscle weakness (generalized): Secondary | ICD-10-CM

## 2019-12-31 DIAGNOSIS — M25512 Pain in left shoulder: Secondary | ICD-10-CM

## 2019-12-31 DIAGNOSIS — Z9889 Other specified postprocedural states: Secondary | ICD-10-CM

## 2019-12-31 DIAGNOSIS — R600 Localized edema: Secondary | ICD-10-CM

## 2019-12-31 NOTE — Therapy (Signed)
[place], [name], [address] Phone: [phone]   Fax:  [phone]  Physical Therapy Treatment  Patient Details  Name: LOGAN BALTIMORE MRN: [id number] Date of Birth: 1955/08/04 Referring Provider (PT): Dr. Tania Ade    Encounter Date: 12/31/2019   PT End of Session - 12/31/19 1040    Visit Number 13    Date for PT Re-Evaluation 01/23/20    Authorization Type UHC MCD    Authorization - Number of Visits 27    PT Start Time 1000    PT Stop Time 1042    PT Time Calculation (min) 42 min    Activity Tolerance Patient tolerated treatment well;Patient limited by pain    Behavior During Therapy [facility] for tasks assessed/performed           Past Medical History:  Diagnosis Date  . Hypertension     Past Surgical History:  Procedure Laterality Date  . APPENDECTOMY    . EYE SURGERY    . HAND SURGERY    . KNEE SURGERY     x3, x1 R  . LIPOMA EXCISION Left 10/13/2019   Procedure: SHOULDER EXCISION LIPOMA AND ROTATOR CUFF REPAIR;  Surgeon: Tania Ade, MD;  Location: [place];  Service: Orthopedics;  Laterality: Left;  . TONSILLECTOMY      There were no vitals filed for this visit.   Subjective Assessment - 12/31/19 1001    Subjective I tried that doorway stretch and it really hurt.  There no pain number for this arm right now (severe)    Pertinent History lateral supraspinatus tear, lipoma removed during surgery    Currently in Pain? Yes    Pain Score 6    severe   Pain Location Shoulder    Pain Orientation Left    Pain Descriptors / Indicators Aching    Pain Type Surgical pain    Pain Onset More than a month ago    Pain Frequency Intermittent    Aggravating Factors  laying on it, reaching limited    Pain Relieving Factors rest, meds, heat              [facility] Adult PT Treatment/Exercise - 12/31/19 0001      Shoulder Exercises: Supine   Shoulder Flexion Weight (lbs)  2 lbs single arm, unable, done with PT assist and no resistance x 10 , then punch x 10 with 2 lbs       Shoulder Exercises: Seated   External Rotation AROM;Strengthening;Both;20 reps    Theraband Level (Shoulder External Rotation) Level 2 (Red);Other (comment)    External Rotation Limitations and no wgt initially     Flexion Limitations looped red band x 10 to 90 deg       Shoulder Exercises: Sidelying   External Rotation Strengthening;Left;20 reps    External Rotation Weight (lbs) 2    Flexion Limitations UE ranger for flexion 2 x 15 reps, then circles     ABduction Strengthening;Left;5 reps    ABduction Weight (lbs) modified to bent elbow to reduce lever x 5 x 2       Shoulder Exercises: Pulleys   Flexion 2 minutes    Scaption 2 minutes    Other Pulley Exercises Internal rotaiton 1 min , painful       Shoulder Exercises: ROM/Strengthening   UBE (Upper Arm Bike) 5 min L1 for warm up     Plank Limitations all 4 's alternating UE reach x 8,  unable to finish, tired, pain in L UE     Modified Plank Limitations standing elbows on ball, used wedge for wrist edge of high table.   Cues for hip extension and protraction of scapula, alignment       Shoulder Exercises: Stretch   Corner Stretch 2 reps;30 seconds    Corner Stretch Limitations painul, showed doorway single                   PT Education - 12/31/19 1044    Education Details time to push strength and POC, HEP corrections    Person(s) Educated Patient    Methods Explanation    Comprehension Verbalized understanding;Returned demonstration            PT Short Term Goals - 12/29/19 1018      PT SHORT TERM GOAL #1   Title Pt will be I with HEP for PROM, posture and RICE    Status Achieved      PT SHORT TERM GOAL #2   Title Pt will be able to report no pain at rest, sitting, no sling as MD allows    Baseline sometimes has pain but usually none    Status Partially Met      PT SHORT TERM GOAL #3   Title Pt will  understand and adhere to protocol for shoulder activity    Status Achieved             PT Long Term Goals - 12/29/19 1019      PT LONG TERM GOAL #1   Title Pt will be able to lift L arm overhead in standing to 120 deg or more for functional reach in his home    Status On-going      PT LONG TERM GOAL #2   Title Pt will be able to complete ADLs, dressing and grooming with L UE use and without increase in shoulder pain    Baseline uses Rt UE    Status On-going      PT LONG TERM GOAL #3   Title Pt will be able to use L UE to hold item (close to body) while using Rt UE for fine motor activities    Status Achieved      PT LONG TERM GOAL #4   Title Pt will be I with HEP for shoulder AROM, posture, strength    Status On-going                 Plan - 12/31/19 1016    Clinical Impression Statement Focus on strengthening, including core in weightbearing.  This was very challenging for him.  He is 8 + weeks out from surgery and showing slow progress of strengthening.   Cont with POC.    PT Treatment/Interventions ADLs/Self Care Home Management;Therapeutic activities;Patient/family education;Taping;Therapeutic exercise;Vasopneumatic Device;Passive range of motion;Manual techniques;Functional mobility training;Ultrasound;Cryotherapy;Moist Heat;Iontophoresis 16m/ml Dexamethasone    PT Next Visit Plan work on strengthening flexion, posture, within ROM , cont manual    PT Home Exercise Plan pendulum , PROM table slides, scapular retraction, upper trap stretch,[id number]    Consulted and Agree with Plan of Care Patient           Patient will benefit from skilled therapeutic intervention in order to improve the following deficits and impairments:  Increased fascial restricitons, Pain, Postural dysfunction, Decreased mobility, Decreased range of motion, Decreased strength, Hypomobility, Impaired UE functional use, Impaired flexibility, Increased edema  Visit Diagnosis: Acute pain of left  shoulder  Muscle  weakness (generalized)  Localized edema  Status post left rotator cuff repair     Problem List Patient Active Problem List   Diagnosis Date Noted  . LVH (left ventricular hypertrophy) 03/06/2018  . Essential hypertension 03/06/2018  . HTN (hypertension), malignant 02/20/2018  . Chest pain 02/20/2018    Rawson Minix 12/31/2019, 12:41 PM  [facility] [address] [place], [name], [address] Phone: [phone]   Fax:  [phone]  Name: SID GREENER MRN: [id number] Date of Birth: 07/09/1955  Raeford Razor, PT 12/31/19 12:42 PM Phone: [phone] Fax: [phone]

## 2020-01-05 ENCOUNTER — Encounter: Payer: Self-pay | Admitting: Physical Therapy

## 2020-01-05 ENCOUNTER — Other Ambulatory Visit: Payer: Self-pay

## 2020-01-05 ENCOUNTER — Ambulatory Visit: Payer: Medicaid Other | Admitting: Physical Therapy

## 2020-01-05 DIAGNOSIS — M25512 Pain in left shoulder: Secondary | ICD-10-CM | POA: Diagnosis not present

## 2020-01-05 DIAGNOSIS — M6281 Muscle weakness (generalized): Secondary | ICD-10-CM

## 2020-01-05 DIAGNOSIS — Z9889 Other specified postprocedural states: Secondary | ICD-10-CM

## 2020-01-05 DIAGNOSIS — R600 Localized edema: Secondary | ICD-10-CM

## 2020-01-05 NOTE — Therapy (Signed)
[place], [name], [address] Phone: [phone]   Fax:  [phone]  Physical Therapy Treatment  Patient Details  Name: Curtis Saunders MRN: [id number] Date of Birth: October 26, 1955 Referring Provider (PT): Dr. Tania Ade    Encounter Date: 01/05/2020   PT End of Session - 01/05/20 1039    Visit Number 14    Number of Visits 16    Date for PT Re-Evaluation 01/23/20    Authorization Type UHC MCD    Authorization - Number of Visits 27    PT Start Time 1004    PT Stop Time 1047    PT Time Calculation (min) 43 min    Activity Tolerance Patient tolerated treatment well;Patient limited by pain    Behavior During Therapy [facility] for tasks assessed/performed           Past Medical History:  Diagnosis Date  . Hypertension     Past Surgical History:  Procedure Laterality Date  . APPENDECTOMY    . EYE SURGERY    . HAND SURGERY    . KNEE SURGERY     x3, x1 R  . LIPOMA EXCISION Left 10/13/2019   Procedure: SHOULDER EXCISION LIPOMA AND ROTATOR CUFF REPAIR;  Surgeon: Tania Ade, MD;  Location: [place];  Service: Orthopedics;  Laterality: Left;  . TONSILLECTOMY      There were no vitals filed for this visit.   Subjective Assessment - 01/05/20 1038    Subjective It hurt me all weekend I need to get this shoulder right!    Currently in Pain? Yes    Pain Score 5     Pain Location Shoulder    Pain Orientation Left    Pain Type Surgical pain    Pain Onset More than a month ago    Pain Frequency Intermittent    Multiple Pain Sites No              [facility] Adult PT Treatment/Exercise - 01/05/20 0001      Shoulder Exercises: Standing   Horizontal ABduction Weight (lbs) 2 lbs x 15    External Rotation Strengthening;Left;15 reps    Theraband Level (Shoulder External Rotation) Level 2 (Red)    Shoulder Flexion Weight (lbs) 2 lbs elevation x 10 (pain) then alternating 3 lbs flexion  x 10    Extension Weight (lbs) 1 x 15 blue band alternating    Row Weight (lbs) high row  blue band x 15      Shoulder Exercises: Pulleys   Flexion 2 minutes    Scaption 2 minutes      Shoulder Exercises: ROM/Strengthening   UBE (Upper Arm Bike) 5 min L1 for warm up    change direction 1 min each     Manual Therapy   Manual therapy comments prolonged static stretch in abductoin and external     Myofascial Release soft tissue to L anterior and post, lateral  shoulder    Passive ROM all planes with joint distraction                     PT Short Term Goals - 01/05/20 1211      PT SHORT TERM GOAL #1   Title Pt will be I with HEP for PROM, posture and RICE    Status Achieved      PT SHORT TERM GOAL #2   Title Pt will be able to report no pain at rest, sitting,  no sling as MD allows    Status Achieved      PT SHORT TERM GOAL #3   Title Pt will understand and adhere to protocol for shoulder activity    Status Achieved             PT Long Term Goals - 01/05/20 1212      PT LONG TERM GOAL #1   Title Pt will be able to lift L arm overhead in standing to 120 deg or more for functional reach in his home    Baseline 110 deg sitting    Status On-going      PT LONG TERM GOAL #2   Title Pt will be able to complete ADLs, dressing and grooming with L UE use and without increase in shoulder pain    Baseline painful    Status On-going      PT LONG TERM GOAL #3   Title Pt will be able to use L UE to hold item (close to body) while using Rt UE for fine motor activities    Status Achieved      PT LONG TERM GOAL #4   Title Pt will be I with HEP for shoulder AROM, posture, strength    Status On-going                 Plan - 01/05/20 1212    Clinical Impression Statement Pt continues with stiffness and pain throughout L UE.  Reports feeling better post session, shoulder "loosened up", pushes through pain with strengthening.  Continues to benefit from skilled PT to  improve functional mobility.  Seated AROM flexion to 110 deg with pulleys.    PT Treatment/Interventions ADLs/Self Care Home Management;Therapeutic activities;Patient/family education;Taping;Therapeutic exercise;Vasopneumatic Device;Passive range of motion;Manual techniques;Functional mobility training;Ultrasound;Cryotherapy;Moist Heat;Iontophoresis 4mg /ml Dexamethasone    PT Next Visit Plan work on strengthening flexion, posture, within ROM , cont manual    PT Home Exercise Plan pendulum , PROM table slides, scapular retraction, upper trap stretch,[id number]    Consulted and Agree with Plan of Care Patient           Patient will benefit from skilled therapeutic intervention in order to improve the following deficits and impairments:  Increased fascial restricitons,Pain,Postural dysfunction,Decreased mobility,Decreased range of motion,Decreased strength,Hypomobility,Impaired UE functional use,Impaired flexibility,Increased edema  Visit Diagnosis: Acute pain of left shoulder  Muscle weakness (generalized)  Localized edema  Status post left rotator cuff repair     Problem List Patient Active Problem List   Diagnosis Date Noted  . LVH (left ventricular hypertrophy) 03/06/2018  . Essential hypertension 03/06/2018  . HTN (hypertension), malignant 02/20/2018  . Chest pain 02/20/2018    Remmington Teters 01/05/2020, 12:16 PM  [facility] [address] [place], [name], [address] Phone: [phone]   Fax:  [phone]  Name: Curtis Saunders MRN: [id number] Date of Birth: Jan 31, 1955  Raeford Razor, PT 01/05/20 12:16 PM Phone: [phone] Fax: [phone]

## 2020-01-07 ENCOUNTER — Encounter: Payer: Self-pay | Admitting: Physical Therapy

## 2020-01-07 ENCOUNTER — Other Ambulatory Visit: Payer: Self-pay

## 2020-01-07 ENCOUNTER — Ambulatory Visit: Payer: Medicaid Other | Admitting: Physical Therapy

## 2020-01-07 DIAGNOSIS — R600 Localized edema: Secondary | ICD-10-CM

## 2020-01-07 DIAGNOSIS — M25512 Pain in left shoulder: Secondary | ICD-10-CM | POA: Diagnosis not present

## 2020-01-07 DIAGNOSIS — Z9889 Other specified postprocedural states: Secondary | ICD-10-CM

## 2020-01-07 DIAGNOSIS — M6281 Muscle weakness (generalized): Secondary | ICD-10-CM

## 2020-01-07 NOTE — Therapy (Signed)
[place], [name], [address] Phone: [phone]   Fax:  [phone]  Physical Therapy Treatment  Patient Details  Name: Curtis Saunders MRN: [id number] Date of Birth: 11/30/1955 Referring Provider (PT): Dr. Tania Ade    Encounter Date: 01/07/2020   PT End of Session - 01/07/20 1008    Visit Number 15    Number of Visits 16    Authorization Type UHC MCD    Authorization - Number of Visits 27    PT Start Time 1004    PT Stop Time 1050    PT Time Calculation (min) 46 min    Activity Tolerance Patient tolerated treatment well;Patient limited by pain    Behavior During Therapy [facility] for tasks assessed/performed           Past Medical History:  Diagnosis Date  . Hypertension     Past Surgical History:  Procedure Laterality Date  . APPENDECTOMY    . EYE SURGERY    . HAND SURGERY    . KNEE SURGERY     x3, x1 R  . LIPOMA EXCISION Left 10/13/2019   Procedure: SHOULDER EXCISION LIPOMA AND ROTATOR CUFF REPAIR;  Surgeon: Tania Ade, MD;  Location: [place];  Service: Orthopedics;  Laterality: Left;  . TONSILLECTOMY      There were no vitals filed for this visit.   Subjective Assessment - 01/07/20 1007    Subjective Arm hurts today, 4/10.    Currently in Pain? Yes    Pain Score 4     Pain Location Shoulder    Pain Orientation Left    Pain Descriptors / Indicators Aching;Sore    Pain Type Surgical pain    Pain Radiating Towards elbow    Pain Onset More than a month ago    Pain Frequency Intermittent    Aggravating Factors  laying on it, reaching lifting    Pain Relieving Factors rest, meds, heat               [facility] Adult PT Treatment/Exercise - 01/07/20 0001      Shoulder Exercises: Supine   Other Supine Exercises supine wand exercises x 15 each to tol (chest press, overhead, ER /IR)      Shoulder Exercises: Seated   External Rotation 10 reps;Left;Weights     External Rotation Weight (lbs) 1, 2 sets in abduction    Flexion Weight (lbs) 1 lbs x 8 hover off table    ABduction Weight (lbs) bent elbow x 10 x 1 set      Shoulder Exercises: Standing   Horizontal ABduction Both;15 reps    Theraband Level (Shoulder Horizontal ABduction) Level 1 (Yellow)    Horizontal ABduction Weight (lbs) pain , 1 x 8    Flexion Left;10 reps;Weights    Shoulder Flexion Weight (lbs) 1, 2 sets    ABduction Left;10 reps    Shoulder ABduction Weight (lbs) 1, 2 sets    Other Standing Exercises flexion x 10 with wand , back to wall for posture, retraction prior x 10      Shoulder Exercises: ROM/Strengthening   Other ROM/Strengthening Exercises wall slide , lift off bilateral scaption and flexion x 10 x 1      Manual Therapy   Manual therapy comments prolonged static stretch in abduction and external    Joint Mobilization Gr II-III inferior glide    Myofascial Release L neck , upper trap, levator scapula    Passive ROM  all planes with joint distraction                     PT Short Term Goals - 01/05/20 1211      PT SHORT TERM GOAL #1   Title Pt will be I with HEP for PROM, posture and RICE    Status Achieved      PT SHORT TERM GOAL #2   Title Pt will be able to report no pain at rest, sitting, no sling as MD allows    Status Achieved      PT SHORT TERM GOAL #3   Title Pt will understand and adhere to protocol for shoulder activity    Status Achieved             PT Long Term Goals - 01/05/20 1212      PT LONG TERM GOAL #1   Title Pt will be able to lift L arm overhead in standing to 120 deg or more for functional reach in his home    Baseline 110 deg sitting    Status On-going      PT LONG TERM GOAL #2   Title Pt will be able to complete ADLs, dressing and grooming with L UE use and without increase in shoulder pain    Baseline painful    Status On-going      PT LONG TERM GOAL #3   Title Pt will be able to use L UE to hold item  (close to body) while using Rt UE for fine motor activities    Status Achieved      PT LONG TERM GOAL #4   Title Pt will be I with HEP for shoulder AROM, posture, strength    Status On-going                 Plan - 01/07/20 1224    Clinical Impression Statement Focus today on L shoulder strength in flexion and scaption.  Limited in ROM and by pain in L anterior shoulder.  L humeral head sits anteriorly, worked on this in sitting with manual PT.  Pocket of swelling present in supraclavicular area.  Stiffness in all planes of motion and progressing slowly.    PT Treatment/Interventions ADLs/Self Care Home Management;Therapeutic activities;Patient/family education;Taping;Therapeutic exercise;Vasopneumatic Device;Passive range of motion;Manual techniques;Functional mobility training;Ultrasound;Cryotherapy;Moist Heat;Iontophoresis 4mg /ml Dexamethasone    PT Next Visit Plan work on strengthening flexion, posture, within ROM , cont manual    PT Home Exercise Plan pendulum , PROM table slides, scapular retraction, upper trap stretch,[id number], shoulder flexion and scaption with soup cans    Consulted and Agree with Plan of Care Patient           Patient will benefit from skilled therapeutic intervention in order to improve the following deficits and impairments:  Increased fascial restricitons,Pain,Postural dysfunction,Decreased mobility,Decreased range of motion,Decreased strength,Hypomobility,Impaired UE functional use,Impaired flexibility,Increased edema  Visit Diagnosis: Acute pain of left shoulder  Muscle weakness (generalized)  Localized edema  Status post left rotator cuff repair     Problem List Patient Active Problem List   Diagnosis Date Noted  . LVH (left ventricular hypertrophy) 03/06/2018  . Essential hypertension 03/06/2018  . HTN (hypertension), malignant 02/20/2018  . Chest pain 02/20/2018    [name] 01/07/2020, 12:32 PM  [facility] [address] [place], [name], [address] Phone: [phone]   Fax:  [phone]  Name: Curtis Saunders MRN: [id number] Date of Birth: February 14, 1955  Raeford Razor, PT  01/07/20 12:32 PM Phone: [phone] Fax: [phone]

## 2020-01-12 ENCOUNTER — Encounter: Payer: Self-pay | Admitting: Physical Therapy

## 2020-01-12 ENCOUNTER — Ambulatory Visit: Payer: Medicaid Other | Admitting: Physical Therapy

## 2020-01-12 ENCOUNTER — Other Ambulatory Visit: Payer: Self-pay

## 2020-01-12 DIAGNOSIS — R600 Localized edema: Secondary | ICD-10-CM

## 2020-01-12 DIAGNOSIS — Z9889 Other specified postprocedural states: Secondary | ICD-10-CM

## 2020-01-12 DIAGNOSIS — M6281 Muscle weakness (generalized): Secondary | ICD-10-CM

## 2020-01-12 DIAGNOSIS — M25512 Pain in left shoulder: Secondary | ICD-10-CM | POA: Diagnosis not present

## 2020-01-12 NOTE — Therapy (Signed)
[place], [name], [address] Phone: [phone]   Fax:  [phone]  Physical Therapy Treatment  Patient Details  Name: [name] MRN: [id number] Date of Birth: [birth year] Referring Provider (PT): Dr. Tania Ade    Encounter Date: 01/12/2020   PT End of Session - 01/12/20 1001    Visit Number 16    Number of Visits 16    Date for PT Re-Evaluation 01/23/20    Authorization Type [employer] MCD    Authorization - Number of Visits 27    PT Start Time 0958    PT Stop Time 1041    PT Time Calculation (min) 43 min    Activity Tolerance Patient tolerated treatment well;Patient limited by pain    Behavior During Therapy [facility] for tasks assessed/performed           Past Medical History:  Diagnosis Date  . Hypertension     Past Surgical History:  Procedure Laterality Date  . APPENDECTOMY    . EYE SURGERY    . HAND SURGERY    . KNEE SURGERY     x3, x1 R  . LIPOMA EXCISION Left 10/13/2019   Procedure: SHOULDER EXCISION LIPOMA AND ROTATOR CUFF REPAIR;  Surgeon: Tania Ade, MD;  Location: [place];  Service: Orthopedics;  Laterality: Left;  . TONSILLECTOMY      There were no vitals filed for this visit.   Subjective Assessment - 01/12/20 1002    Subjective My arm feels great right now.  I'm tired.    Currently in Pain? Yes    Pain Score 7    during UBE   Pain Location Shoulder    Pain Orientation Left    Pain Descriptors / Indicators Aching;Sore    Pain Type Surgical pain    Pain Onset More than a month ago    Pain Frequency Intermittent    Aggravating Factors  using arm, laying it , reaching , lifting    Pain Relieving Factors meds, heat    Multiple Pain Sites No              [facility] PT Assessment - 01/12/20 0001      AROM   Left Shoulder Flexion 105 Degrees    Left Shoulder ABduction 90 Degrees    Left Shoulder Internal Rotation --   FR to L3   Left Shoulder  External Rotation --   FR to back of head           [facility] Adult PT Treatment/Exercise - 01/12/20 0001      Shoulder Exercises: Supine   External Rotation Right;Left;20 reps;Theraband    Theraband Level (Shoulder External Rotation) Level 2 (Red)      Shoulder Exercises: Seated   External Rotation Left;15 reps    External Rotation Weight (lbs) 2 lbs at table    ABduction Weight (lbs) bent elbow, 2 lbs  x 10 x 1 set      Shoulder Exercises: Standing   Horizontal ABduction Strengthening;Both;10 reps    Theraband Level (Shoulder Horizontal ABduction) Level 2 (Red)    Horizontal ABduction Weight (lbs) standing and then supine    Horizontal ABduction Limitations on wall red band    External Rotation Strengthening;Left;15 reps    Theraband Level (Shoulder External Rotation) Level 2 (Red)    Shoulder Flexion Weight (lbs) red and then yellow x 10 each, painful    Extension Weight (lbs) 3 x 15 green band alternating  Row Weight (lbs) high row x    green band x 15      Shoulder Exercises: ROM/Strengthening   UBE (Upper Arm Bike) 5 min L2 for warm up   change direction 1 min each   Other ROM/Strengthening Exercises wall slides too painful, change to table slides x 10 in flexion, scaption with 10 sec hold, cues tactile and verbal      Manual Therapy   Joint Mobilization Gr II-III inferior glide    Passive ROM all planes with joint distraction                   PT Education - 01/12/20 1201    Education Details OK to regress to table slides if pain prevents. POC.    Person(s) Educated Patient    Methods Explanation    Comprehension Verbalized understanding            PT Short Term Goals - 01/05/20 1211      PT SHORT TERM GOAL #1   Title Pt will be I with HEP for PROM, posture and RICE    Status Achieved      PT SHORT TERM GOAL #2   Title Pt will be able to report no pain at rest, sitting, no sling as MD allows    Status Achieved      PT SHORT TERM GOAL #3   Title  Pt will understand and adhere to protocol for shoulder activity    Status Achieved             PT Long Term Goals - 01/05/20 1212      PT LONG TERM GOAL #1   Title Pt will be able to lift L arm overhead in standing to 120 deg or more for functional reach in his home    Baseline 110 deg sitting    Status On-going      PT LONG TERM GOAL #2   Title Pt will be able to complete ADLs, dressing and grooming with L UE use and without increase in shoulder pain    Baseline painful    Status On-going      PT LONG TERM GOAL #3   Title Pt will be able to use L UE to hold item (close to body) while using Rt UE for fine motor activities    Status Achieved      PT LONG TERM GOAL #4   Title Pt will be I with HEP for shoulder AROM, posture, strength    Status On-going                 Plan - 01/12/20 1203    Clinical Impression Statement Patient limited in UE strength and AROM.  Pain minimal at rest but still unable to effectively use L UE for ADLs and self care.  Able to reach to 105 deg today, end of session with some trunk compensation.  He was thinking he was done with PT this week but due to continued limitations I encouraged him to make more appts into next month.    PT Treatment/Interventions ADLs/Self Care Home Management;Therapeutic activities;Patient/family education;Taping;Therapeutic exercise;Vasopneumatic Device;Passive range of motion;Manual techniques;Functional mobility training;Ultrasound;Cryotherapy;Moist Heat;Iontophoresis 4mg /ml Dexamethasone    PT Next Visit Plan work on strengthening flexion, posture, within ROM , cont manual    PT Home Exercise Plan pendulum , PROM table slides, scapular retraction, upper trap stretch,[id number], shoulder flexion and scaption with soup cans    Consulted and Agree with Plan of Care Patient  Patient will benefit from skilled therapeutic intervention in order to improve the following deficits and impairments:  Increased fascial  restricitons,Pain,Postural dysfunction,Decreased mobility,Decreased range of motion,Decreased strength,Hypomobility,Impaired UE functional use,Impaired flexibility,Increased edema  Visit Diagnosis: Acute pain of left shoulder  Muscle weakness (generalized)  Localized edema  Status post left rotator cuff repair     Problem List Patient Active Problem List   Diagnosis Date Noted  . LVH (left ventricular hypertrophy) 03/06/2018  . Essential hypertension 03/06/2018  . HTN (hypertension), malignant 02/20/2018  . Chest pain 02/20/2018    Briselda Naval 01/12/2020, 12:12 PM  [facility] [address] [place], [name], [address] Phone: [phone]   Fax:  [phone]  Name: AXIEL FJELD MRN: [id number] Date of Birth: 1956-01-23  Raeford Razor, PT 01/12/20 12:13 PM Phone: [phone] Fax: [phone]

## 2020-01-14 ENCOUNTER — Other Ambulatory Visit: Payer: Self-pay

## 2020-01-14 ENCOUNTER — Encounter: Payer: Self-pay | Admitting: Physical Therapy

## 2020-01-14 ENCOUNTER — Ambulatory Visit: Payer: Medicaid Other | Admitting: Physical Therapy

## 2020-01-14 DIAGNOSIS — M25512 Pain in left shoulder: Secondary | ICD-10-CM

## 2020-01-14 DIAGNOSIS — Z9889 Other specified postprocedural states: Secondary | ICD-10-CM

## 2020-01-14 DIAGNOSIS — M6281 Muscle weakness (generalized): Secondary | ICD-10-CM

## 2020-01-14 DIAGNOSIS — R600 Localized edema: Secondary | ICD-10-CM

## 2020-01-14 NOTE — Therapy (Signed)
[place], [name], [address] Phone: [phone]   Fax:  [phone]  Physical Therapy Treatment  Patient Details  Name: Curtis Saunders MRN: [id number] Date of Birth: 01/24/1955 Referring Provider (PT): Dr. Tania Ade    Encounter Date: 01/14/2020   PT End of Session - 01/14/20 0956    Visit Number 17    Date for PT Re-Evaluation 01/23/20    Authorization Type [employer] MCD    Authorization - Number of Visits 27    PT Start Time 0953    PT Stop Time 1033    PT Time Calculation (min) 40 min    Activity Tolerance Patient tolerated treatment well;Patient limited by pain    Behavior During Therapy [facility] for tasks assessed/performed           Past Medical History:  Diagnosis Date  . Hypertension     Past Surgical History:  Procedure Laterality Date  . APPENDECTOMY    . EYE SURGERY    . HAND SURGERY    . KNEE SURGERY     x3, x1 R  . LIPOMA EXCISION Left 10/13/2019   Procedure: SHOULDER EXCISION LIPOMA AND ROTATOR CUFF REPAIR;  Surgeon: Tania Ade, MD;  Location: [place];  Service: Orthopedics;  Laterality: Left;  . TONSILLECTOMY      There were no vitals filed for this visit.   Subjective Assessment - 01/14/20 0954    Subjective Patient has 7/10 pain today.  Moving slowly.    Currently in Pain? Yes    Pain Score 7     Pain Location Shoulder    Pain Orientation Left    Pain Descriptors / Indicators Other (Comment)   hurting   Pain Type Chronic pain;Surgical pain    Pain Onset More than a month ago    Pain Frequency Intermittent            [facility] Adult PT Treatment/Exercise - 01/14/20 0001      Shoulder Exercises: Supine   Horizontal ABduction Strengthening;20 reps;Theraband    Theraband Level (Shoulder Horizontal ABduction) Level 2 (Red)    External Rotation Right;Left;20 reps;Theraband    Theraband Level (Shoulder External Rotation) Level 2 (Red)    Flexion 10  reps;AAROM    Shoulder Flexion Weight (lbs) cane    Other Supine Exercises cross body reaching x 10 with cane      Shoulder Exercises: Standing   Shoulder Flexion Weight (lbs) yellow x 15    ABduction Limitations yellow band x 15 (pain)    Other Standing Exercises post delt pull yellow x 10      Shoulder Exercises: ROM/Strengthening   Nustep 5 min UE and LE L6    Wall Pushups 10 reps    Modified Plank Limitations used hands on wall to address full elbow extension and scapular position   elbow ext x 10     Shoulder Exercises: Stretch   Table Stretch -Flexion Limitations biceps stretch /anterior shoulder on wall , table x 3 each    Other Shoulder Stretches used stretch strap for capsule stretching in doorway : capsule facing side, back, and front    Other Shoulder Stretches wall ladder for overhead reaching 5 x 20 sec and scaption x 5      Manual Therapy   Passive ROM < 5 min  all planes with joint distraction  PT Short Term Goals - 01/05/20 1211      PT SHORT TERM GOAL #1   Title Pt will be I with HEP for PROM, posture and RICE    Status Achieved      PT SHORT TERM GOAL #2   Title Pt will be able to report no pain at rest, sitting, no sling as MD allows    Status Achieved      PT SHORT TERM GOAL #3   Title Pt will understand and adhere to protocol for shoulder activity    Status Achieved             PT Long Term Goals - 01/05/20 1212      PT LONG TERM GOAL #1   Title Pt will be able to lift L arm overhead in standing to 120 deg or more for functional reach in his home    Baseline 110 deg sitting    Status On-going      PT LONG TERM GOAL #2   Title Pt will be able to complete ADLs, dressing and grooming with L UE use and without increase in shoulder pain    Baseline painful    Status On-going      PT LONG TERM GOAL #3   Title Pt will be able to use L UE to hold item (close to body) while using Rt UE for fine motor activities     Status Achieved      PT LONG TERM GOAL #4   Title Pt will be I with HEP for shoulder AROM, posture, strength    Status On-going                 Plan - 01/14/20 0956    Clinical Impression Statement Today patient had more pain than usual, unable to effectively strengthen in standing. He need to use his Rt arm to assist his Lt shoulder into abduction.  Sees MD on 01/22/20.  Pt has been in constant pain since last visit.  Declined IFC today as it made his arm more painful last time it was done.    PT Treatment/Interventions ADLs/Self Care Home Management;Therapeutic activities;Patient/family education;Taping;Therapeutic exercise;Vasopneumatic Device;Passive range of motion;Manual techniques;Functional mobility training;Ultrasound;Cryotherapy;Moist Heat;Iontophoresis 4mg /ml Dexamethasone    PT Next Visit Plan work on strengthening flexion, posture, within ROM , cont manual    PT Home Exercise Plan pendulum , PROM table slides, scapular retraction, upper trap stretch,[id number], shoulder flexion and scaption with soup cans    Consulted and Agree with Plan of Care Patient           Patient will benefit from skilled therapeutic intervention in order to improve the following deficits and impairments:  Increased fascial restricitons,Pain,Postural dysfunction,Decreased mobility,Decreased range of motion,Decreased strength,Hypomobility,Impaired UE functional use,Impaired flexibility,Increased edema  Visit Diagnosis: Acute pain of left shoulder  Muscle weakness (generalized)  Localized edema  Status post left rotator cuff repair     Problem List Patient Active Problem List   Diagnosis Date Noted  . LVH (left ventricular hypertrophy) 03/06/2018  . Essential hypertension 03/06/2018  . HTN (hypertension), malignant 02/20/2018  . Chest pain 02/20/2018    Curtis Saunders 01/14/2020, 10:42 AM  [facility] [address] [place],  [name], [address] Phone: [phone]   Fax:  [phone]  Name: Curtis Saunders MRN: [id number] Date of Birth: 05-13-1955  Raeford Razor, PT 01/14/20 10:42 AM Phone: [phone] Fax: [phone]

## 2020-01-20 ENCOUNTER — Encounter: Payer: Self-pay | Admitting: Physical Therapy

## 2020-01-20 ENCOUNTER — Other Ambulatory Visit: Payer: Self-pay

## 2020-01-20 ENCOUNTER — Ambulatory Visit: Payer: Medicaid Other | Admitting: Physical Therapy

## 2020-01-20 DIAGNOSIS — M25512 Pain in left shoulder: Secondary | ICD-10-CM | POA: Diagnosis not present

## 2020-01-20 DIAGNOSIS — M6281 Muscle weakness (generalized): Secondary | ICD-10-CM

## 2020-01-20 DIAGNOSIS — Z9889 Other specified postprocedural states: Secondary | ICD-10-CM

## 2020-01-20 DIAGNOSIS — R600 Localized edema: Secondary | ICD-10-CM

## 2020-01-20 NOTE — Therapy (Signed)
[facility] [address] [place], [place], [address] Phone: [phone]   Fax:  [phone]  Physical Therapy Treatment  Patient Details  Name: Curtis Saunders MRN: [id number] Date of Birth: 03/02/1955 Referring Provider (PT): Dr. Jones Broom    Encounter Date: 01/20/2020   PT End of Session - 01/20/20 0841    Visit Number 18    Date for PT Re-Evaluation 01/23/20    Authorization Type [employer] MCD    PT Start Time 0832    PT Stop Time 0914    PT Time Calculation (min) 42 min    Activity Tolerance Patient tolerated treatment well    Behavior During Therapy [facility] for tasks assessed/performed           Past Medical History:  Diagnosis Date  . Hypertension     Past Surgical History:  Procedure Laterality Date  . APPENDECTOMY    . EYE SURGERY    . HAND SURGERY    . KNEE SURGERY     x3, x1 R  . LIPOMA EXCISION Left 10/13/2019   Procedure: SHOULDER EXCISION LIPOMA AND ROTATOR CUFF REPAIR;  Surgeon: Jones Broom, MD;  Location: [facility];  Service: Orthopedics;  Laterality: Left;  . TONSILLECTOMY      There were no vitals filed for this visit.   Subjective Assessment - 01/20/20 0841    Subjective I have no pain .    Currently in Pain? No/denies              [facility] Adult PT Treatment/Exercise - 01/20/20 0001      Shoulder Exercises: Prone   Retraction Strengthening;Both;10 reps    Retraction Weight (lbs) max cues      Shoulder Exercises: Standing   Horizontal ABduction 10 reps    Theraband Level (Shoulder Horizontal ABduction) Level 1 (Yellow)    External Rotation Left;15 reps    Theraband Level (Shoulder External Rotation) Level 1 (Yellow)    Internal Rotation Left;15 reps;Theraband    Theraband Level (Shoulder Internal Rotation) Level 2 (Red)    Shoulder Flexion Weight (lbs) yellow x 15    Theraband Level (Shoulder ABduction) Level 1 (Yellow)    Shoulder ABduction Weight (lbs) x 10 poor  form    Extension Strengthening;Both;20 reps;Theraband    Theraband Level (Shoulder Extension) Level 3 (Green)    Row Strengthening;Right;Left;20 reps;Theraband    Theraband Level (Shoulder Row) Level 3 (Green)    Diagonals Strengthening;Left;10 reps    Theraband Level (Shoulder Diagonals) Level 1 (Yellow)      Shoulder Exercises: ROM/Strengthening   UBE (Upper Arm Bike) 6 min L2 , 3 min forard, 3 min back    Modified Plank Limitations hands on high table, 3 sets , added stabilization challenge   shldr taps, leg lifts , poor form in L spine   Other ROM/Strengthening Exercises overhead reach 2 lbs to shelf , then 4 lbs , about 1 min each      Manual Therapy   Manual therapy comments prone PROM and joint mobilization    Passive ROM ER/IR with arm abducted , joint distraction                    PT Short Term Goals - 01/05/20 1211      PT SHORT TERM GOAL #1   Title Pt will be I with HEP for PROM, posture and RICE    Status Achieved      PT SHORT TERM GOAL #2  Title Pt will be able to report no pain at rest, sitting, no sling as MD allows    Status Achieved      PT SHORT TERM GOAL #3   Title Pt will understand and adhere to protocol for shoulder activity    Status Achieved             PT Long Term Goals - 01/05/20 1212      PT LONG TERM GOAL #1   Title Pt will be able to lift L arm overhead in standing to 120 deg or more for functional reach in his home    Baseline 110 deg sitting    Status On-going      PT LONG TERM GOAL #2   Title Pt will be able to complete ADLs, dressing and grooming with L UE use and without increase in shoulder pain    Baseline painful    Status On-going      PT LONG TERM GOAL #3   Title Pt will be able to use L UE to hold item (close to body) while using Rt UE for fine motor activities    Status Achieved      PT LONG TERM GOAL #4   Title Pt will be I with HEP for shoulder AROM, posture, strength    Status On-going                  Plan - 01/20/20 1101    Clinical Impression Statement Patient now with min to no pain, still quite limited in overhead reaching and abduction.  Strengthened in closed chain and prone as well which was quite challenging for him.  Will likely benefit from continued PT to further improve functional ROM and strength.    PT Treatment/Interventions ADLs/Self Care Home Management;Therapeutic activities;Patient/family education;Taping;Therapeutic exercise;Vasopneumatic Device;Passive range of motion;Manual techniques;Functional mobility training;Ultrasound;Cryotherapy;Moist Heat;Iontophoresis 4mg /ml Dexamethasone    PT Next Visit Plan renew, how was MD? work on strengthening flexion, posture, within ROM , cont manual    PT Home Exercise Plan pendulum , PROM table slides, scapular retraction, upper trap stretch,[id number], shoulder flexion and scaption with soup cans    Consulted and Agree with Plan of Care Patient           Patient will benefit from skilled therapeutic intervention in order to improve the following deficits and impairments:  Increased fascial restricitons,Pain,Postural dysfunction,Decreased mobility,Decreased range of motion,Decreased strength,Hypomobility,Impaired UE functional use,Impaired flexibility,Increased edema  Visit Diagnosis: Acute pain of left shoulder  Muscle weakness (generalized)  Localized edema  Status post left rotator cuff repair     Problem List Patient Active Problem List   Diagnosis Date Noted  . LVH (left ventricular hypertrophy) 03/06/2018  . Essential hypertension 03/06/2018  . HTN (hypertension), malignant 02/20/2018  . Chest pain 02/20/2018    Luca Dyar 01/20/2020, 11:05 AM  [facility] [address] [place], [place], [place] Phone: [phone]   Fax:  [phone]  Name: MIKHAI BIENVENUE MRN: Donnetta Simpers Date of Birth: 07/03/1955  13/04/1955, PT 01/20/20 11:05  AM Phone: [phone] Fax: [phone]

## 2020-01-22 ENCOUNTER — Ambulatory Visit: Payer: Medicaid Other | Admitting: Physical Therapy

## 2020-01-22 ENCOUNTER — Other Ambulatory Visit: Payer: Self-pay

## 2020-01-22 ENCOUNTER — Encounter: Payer: Self-pay | Admitting: Physical Therapy

## 2020-01-22 DIAGNOSIS — M25512 Pain in left shoulder: Secondary | ICD-10-CM

## 2020-01-22 DIAGNOSIS — M6281 Muscle weakness (generalized): Secondary | ICD-10-CM

## 2020-01-22 DIAGNOSIS — R600 Localized edema: Secondary | ICD-10-CM

## 2020-01-22 DIAGNOSIS — Z9889 Other specified postprocedural states: Secondary | ICD-10-CM

## 2020-01-22 NOTE — Therapy (Signed)
[place] Sa[place], [name], [address] Phone: [phone]   Fax:  [phone]  Physical Therapy Treatment  Patient Details  Name: Curtis Saunders MRN: [id number] Date of Birth: 05-Feb-1955 Referring Provider (PT): Dr. Tania Ade    Encounter Date: 01/22/2020   PT End of Session - 01/22/20 0900    Visit Number 19    Number of Visits 27    Date for PT Re-Evaluation 02/19/20    Authorization Type U[employer] MCD    PT Start Time 0822    PT Stop Time 0902    PT Time Calculation (min) 40 min    Activity Tolerance Patient tolerated treatment well    Behavior During Therapy [facility] for tasks assessed/performed           Past Medical History:  Diagnosis Date  . Hypertension     Past Surgical History:  Procedure Laterality Date  . APPENDECTOMY    . EYE SURGERY    . HAND SURGERY    . KNEE SURGERY     x3, x1 R  . LIPOMA EXCISION Left 10/13/2019   Procedure: SHOULDER EXCISION LIPOMA AND ROTATOR CUFF REPAIR;  Surgeon: Tania Ade, MD;  Location: [place];  Service: Orthopedics;  Laterality: Left;  . TONSILLECTOMY      There were no vitals filed for this visit.   Subjective Assessment - 01/22/20 0841    Subjective No pain .  Doctor said its coming along.  He gave me some medicine.              [facility] PT Assessment - 01/22/20 0001      AROM   Left Shoulder Flexion 106 Degrees    Left Shoulder ABduction 100 Degrees    Left Shoulder Internal Rotation --   FR to L3   Left Shoulder External Rotation --   FR to back of L neck, head     Strength   Left Shoulder Flexion 3-/5    Left Shoulder ABduction 3-/5            [facility] Adult PT Treatment/Exercise - 01/22/20 0001      Shoulder Exercises: Supine   Shoulder Flexion Weight (lbs) 2 lbs punch x 20    Other Supine Exercises small circles for stabilization x 15 x 2      Shoulder Exercises: Seated   External Rotation 15 reps    External  Rotation Weight (lbs) 2, 3    Abduction 15 reps    ABduction Weight (lbs) 2, 3      Shoulder Exercises: Prone   Retraction Strengthening;Both;10 reps    Retraction Weight (lbs) max cues    Extension 10 reps    Extension Weight (lbs) retraction      Shoulder Exercises: Sidelying   External Rotation Strengthening;Left;20 reps    External Rotation Weight (lbs) 2    Flexion Strengthening;AROM;Left;20 reps    Flexion Weight (lbs) unable with 2 lbs    ABduction Strengthening;Left;20 reps    ABduction Weight (lbs) unable with 2 lbs      Shoulder Exercises: Standing   Horizontal ABduction 10 reps    Theraband Level (Shoulder Horizontal ABduction) Level 2 (Red)    Horizontal ABduction Weight (lbs) 2 sets      Shoulder Exercises: ROM/Strengthening   UBE (Upper Arm Bike) in reverse, 6 min L 3    Other ROM/Strengthening Exercises Body blade, 3 positions  for about 45 sec each : flexion, abd and  ER/IR      Shoulder Exercises: Stretch   Other Shoulder Stretches standing wand stretching: extension, internal rotation, abduction and flexion x 15 each , uces for posture      Manual Therapy   Scapular Mobilization assist in sidelying    Passive ROM all planes , including elbow extensors (biceps )                    PT Short Term Goals - 01/05/20 1211      PT SHORT TERM GOAL #1   Title Pt will be I with HEP for PROM, posture and RICE    Status Achieved      PT SHORT TERM GOAL #2   Title Pt will be able to report no pain at rest, sitting, no sling as MD allows    Status Achieved      PT SHORT TERM GOAL #3   Title Pt will understand and adhere to protocol for shoulder activity    Status Achieved             PT Long Term Goals - 01/22/20 0851      PT LONG TERM GOAL #1   Title Pt will be able to lift L arm overhead in standing to 120 deg or more for functional reach in his home    Baseline 105-110 deg with compensation    Time 4    Period Weeks    Status On-going     Target Date 02/19/20      PT LONG TERM GOAL #2   Title Pt will be able to complete ADLs, dressing and grooming with L UE use and without increase in shoulder pain    Baseline can manage without pain most of the time    Time 4    Period Weeks    Status Partially Met    Target Date 02/19/20      PT LONG TERM GOAL #3   Title Pt will be able to use L UE to hold item (close to body) while using Rt UE for fine motor activities    Status Achieved      PT LONG TERM GOAL #4   Title Pt will be I with HEP for shoulder AROM, posture, strength    Status On-going    Target Date 02/19/20      PT LONG TERM GOAL #5   Title Pt will be able to lift arm 5 lbs to above shoulder height for more funtional strength (4-/5, 1 muscle grade )    Baseline 3-/5    Time 4    Period Weeks    Status New    Target Date 02/19/20                 Plan - 01/22/20 0855    Clinical Impression Statement Patient with weakness in L UE 3-/5 in flexion and abduction, limited ROM mostly due to strength. He uses his arm at home for functional activites and for the most part pain is controlled. Will see for 1 more month and reassess for need for continued skilled PT.    Personal Factors and Comorbidities Comorbidity 1;Social Background;Behavior Pattern;Time since onset of injury/illness/exacerbation    Comorbidities HTN    Examination-Activity Limitations Bathing;Hygiene/Grooming;Bed Mobility;Lift;Reach Overhead;Carry;Sleep    Examination-Participation Restrictions Community Activity;Meal Prep;Driving    Stability/Clinical Decision Making Stable/Uncomplicated    Clinical Decision Making Low    Rehab Potential Excellent    PT Frequency 2x / week  PT Duration 4 weeks    PT Treatment/Interventions ADLs/Self Care Home Management;Therapeutic activities;Patient/family education;Taping;Therapeutic exercise;Vasopneumatic Device;Passive range of motion;Manual techniques;Functional mobility  training;Ultrasound;Cryotherapy;Moist Heat;Iontophoresis 4mg /ml Dexamethasone    PT Next Visit Plan work on strengthening flexion, posture, within ROM , cont manual    PT Home Exercise Plan pendulum , PROM table slides, scapular retraction, upper trap stretch,[id number], shoulder flexion and scaption with soup cans    Consulted and Agree with Plan of Care Patient           Patient will benefit from skilled therapeutic intervention in order to improve the following deficits and impairments:  Increased fascial restricitons,Pain,Postural dysfunction,Decreased mobility,Decreased range of motion,Decreased strength,Hypomobility,Impaired UE functional use,Impaired flexibility,Increased edema  Visit Diagnosis: Acute pain of left shoulder  Muscle weakness (generalized)  Localized edema  Status post left rotator cuff repair     Problem List Patient Active Problem List   Diagnosis Date Noted  . LVH (left ventricular hypertrophy) 03/06/2018  . Essential hypertension 03/06/2018  . HTN (hypertension), malignant 02/20/2018  . Chest pain 02/20/2018    Brenetta Penny 01/22/2020, 9:13 AM  [facility] [address] [place], [name], [address] Phone: [phone]   Fax:  [phone]  Name: JEANNE TERRANCE MRN: [id number] Date of Birth: 11/22/1955  Raeford Razor, PT 01/22/20 9:13 AM Phone: [phone] Fax: [phone]

## 2020-01-26 ENCOUNTER — Ambulatory Visit: Payer: Medicaid Other | Attending: Orthopedic Surgery | Admitting: Physical Therapy

## 2020-01-26 ENCOUNTER — Encounter: Payer: Self-pay | Admitting: Physical Therapy

## 2020-01-26 ENCOUNTER — Other Ambulatory Visit: Payer: Self-pay

## 2020-01-26 DIAGNOSIS — Z9889 Other specified postprocedural states: Secondary | ICD-10-CM

## 2020-01-26 DIAGNOSIS — M25512 Pain in left shoulder: Secondary | ICD-10-CM | POA: Insufficient documentation

## 2020-01-26 DIAGNOSIS — R600 Localized edema: Secondary | ICD-10-CM | POA: Diagnosis present

## 2020-01-26 DIAGNOSIS — M6281 Muscle weakness (generalized): Secondary | ICD-10-CM | POA: Insufficient documentation

## 2020-01-26 NOTE — Therapy (Signed)
[facility] [address] [place], [place], [address] Phone: [phone]   Fax:  [phone]  Physical Therapy Treatment  Patient Details  Name: Curtis Saunders MRN: [id number] Date of Birth: Aug 21, 1955 Referring Provider (PT): Dr. Jones Broom    Encounter Date: 01/26/2020   PT End of Session - 01/26/20 1020    Visit Number 20    Number of Visits 27    Date for PT Re-Evaluation 02/19/20    Authorization Type UHC MCD    Authorization - Visit Number 20    Authorization - Number of Visits 27    Progress Note Due on Visit --    PT Start Time 1017    PT Stop Time 1100    PT Time Calculation (min) 43 min           Past Medical History:  Diagnosis Date  . Hypertension     Past Surgical History:  Procedure Laterality Date  . APPENDECTOMY    . EYE SURGERY    . HAND SURGERY    . KNEE SURGERY     x3, x1 R  . LIPOMA EXCISION Left 10/13/2019   Procedure: SHOULDER EXCISION LIPOMA AND ROTATOR CUFF REPAIR;  Surgeon: Jones Broom, MD;  Location: [facility];  Service: Orthopedics;  Laterality: Left;  . TONSILLECTOMY      There were no vitals filed for this visit.   Subjective Assessment - 01/26/20 1020    Subjective No pain today. I feel like I am using the arm better.    Currently in Pain? No/denies                             [facility] Adult PT Treatment/Exercise - 01/26/20 0001      Shoulder Exercises: Supine   Protraction 15 reps    Protraction Weight (lbs) 2    Horizontal ABduction Strengthening;20 reps;Theraband    Theraband Level (Shoulder Horizontal ABduction) Level 3 (Green)    Other Supine Exercises small circles for stabilization x 15 x 2      Shoulder Exercises: Prone   Retraction Strengthening;Both;10 reps    Retraction Weight (lbs) max cues      Shoulder Exercises: Sidelying   External Rotation Strengthening;Left;20 reps    Flexion Strengthening;AROM;Left;20 reps     ABduction Limitations horizontal abduction with assist      Shoulder Exercises: Standing   External Rotation Left;15 reps    Theraband Level (Shoulder External Rotation) Level 1 (Yellow)    Internal Rotation Left;15 reps;Theraband    Theraband Level (Shoulder Internal Rotation) Level 2 (Red)    Shoulder Flexion Weight (lbs) yellow x 15    Extension Strengthening;Both;20 reps;Theraband    Theraband Level (Shoulder Extension) Level 3 (Green)    Row Strengthening;Right;Left;20 reps;Theraband    Theraband Level (Shoulder Row) Level 3 (Green)    Other Standing Exercises scaption AROM x 10      Shoulder Exercises: ROM/Strengthening   UBE (Upper Arm Bike) in reverse, 6 min L 3    Wall Pushups 15 reps      Shoulder Exercises: Stretch   Other Shoulder Stretches wall ladder for overhead reaching 5 x 20 sec and scaption x 5      Manual Therapy   Passive ROM prone PROM shoulder IR/ER                    PT Short Term Goals - 01/05/20 1211  PT SHORT TERM GOAL #1   Title Pt will be I with HEP for PROM, posture and RICE    Status Achieved      PT SHORT TERM GOAL #2   Title Pt will be able to report no pain at rest, sitting, no sling as MD allows    Status Achieved      PT SHORT TERM GOAL #3   Title Pt will understand and adhere to protocol for shoulder activity    Status Achieved             PT Long Term Goals - 01/22/20 0851      PT LONG TERM GOAL #1   Title Pt will be able to lift L arm overhead in standing to 120 deg or more for functional reach in his home    Baseline 105-110 deg with compensation    Time 4    Period Weeks    Status On-going    Target Date 02/19/20      PT LONG TERM GOAL #2   Title Pt will be able to complete ADLs, dressing and grooming with L UE use and without increase in shoulder pain    Baseline can manage without pain most of the time    Time 4    Period Weeks    Status Partially Met    Target Date 02/19/20      PT LONG TERM GOAL  #3   Title Pt will be able to use L UE to hold item (close to body) while using Rt UE for fine motor activities    Status Achieved      PT LONG TERM GOAL #4   Title Pt will be I with HEP for shoulder AROM, posture, strength    Status On-going    Target Date 02/19/20      PT LONG TERM GOAL #5   Title Pt will be able to lift arm 5 lbs to above shoulder height for more funtional strength (4-/5, 1 muscle grade )    Baseline 3-/5    Time 4    Period Weeks    Status New    Target Date 02/19/20                 Plan - 01/26/20 1112    Clinical Impression Statement Pt reports no pain. Continued with strenthening focus today in standing and supine and prone persicap. He contoinues with difficulty in periscap activation in prone and needs tactile and verbal cues.    PT Next Visit Plan work on strengthening flexion, posture, within ROM , cont manual and prone scap    PT Home Exercise Plan pendulum , PROM table slides, scapular retraction, upper trap stretch,[id number], shoulder flexion and scaption with soup cans           Patient will benefit from skilled therapeutic intervention in order to improve the following deficits and impairments:  Increased fascial restricitons,Pain,Postural dysfunction,Decreased mobility,Decreased range of motion,Decreased strength,Hypomobility,Impaired UE functional use,Impaired flexibility,Increased edema  Visit Diagnosis: Acute pain of left shoulder  Muscle weakness (generalized)  Localized edema  Status post left rotator cuff repair     Problem List Patient Active Problem List   Diagnosis Date Noted  . LVH (left ventricular hypertrophy) 03/06/2018  . Essential hypertension 03/06/2018  . HTN (hypertension), malignant 02/20/2018  . Chest pain 02/20/2018    Sherrie Mustache, PTA 01/26/2020, 11:19 AM  [facility] [address] [place], [place], [address] Phone:  [phone]   Fax:   [phone]  Name: Curtis Saunders MRN: [id number] Date of Birth: [birth year]

## 2020-01-28 ENCOUNTER — Other Ambulatory Visit: Payer: Self-pay

## 2020-01-28 ENCOUNTER — Encounter: Payer: Self-pay | Admitting: Physical Therapy

## 2020-01-28 ENCOUNTER — Ambulatory Visit: Payer: Medicaid Other | Admitting: Physical Therapy

## 2020-01-28 DIAGNOSIS — M25512 Pain in left shoulder: Secondary | ICD-10-CM

## 2020-01-28 DIAGNOSIS — M6281 Muscle weakness (generalized): Secondary | ICD-10-CM

## 2020-01-28 DIAGNOSIS — R600 Localized edema: Secondary | ICD-10-CM

## 2020-01-28 DIAGNOSIS — Z9889 Other specified postprocedural states: Secondary | ICD-10-CM

## 2020-01-28 NOTE — Therapy (Signed)
[name], [name], [address] Phone: [phone]   Fax:  [phone]  Physical Therapy Treatment  Patient Details  Name: Curtis Saunders MRN: [id number] Date of Birth: 1955/05/06 Referring Provider (PT): Dr. Tania Saunders    Encounter Date: 01/28/2020   PT End of Session - 01/28/20 1236    Visit Number 21    Number of Visits 27    Date for PT Re-Evaluation 02/19/20    Authorization Type U[employer] MCD    Authorization - Visit Number 21    Authorization - Number of Visits 27    PT Start Time 5852    PT Stop Time 1310    PT Time Calculation (min) 40 min           Past Medical History:  Diagnosis Date  . Hypertension     Past Surgical History:  Procedure Laterality Date  . APPENDECTOMY    . EYE SURGERY    . HAND SURGERY    . KNEE SURGERY     x3, x1 R  . LIPOMA EXCISION Left 10/13/2019   Procedure: SHOULDER EXCISION LIPOMA AND ROTATOR CUFF REPAIR;  Surgeon: Curtis Ade, MD;  Location: [facility];  Service: Orthopedics;  Laterality: Left;  . TONSILLECTOMY      There were no vitals filed for this visit.   Subjective Assessment - 01/28/20 1235    Subjective No pain now.    Currently in Pain? No/denies              [name] PT Assessment - 01/28/20 0001      AROM   Left Shoulder Flexion 120 Degrees    Left Shoulder ABduction 95 Degrees    Left Shoulder Internal Rotation --   FR to L3   Left Shoulder External Rotation --   FR to back of neck                        [facility] Adult PT Treatment/Exercise - 01/28/20 0001      Shoulder Exercises: Supine   Protraction 20 reps    Protraction Weight (lbs) 2      Shoulder Exercises: Prone   Retraction Strengthening;Both;15 reps    Extension 10 reps    Extension Weight (lbs) retraction    Other Prone Exercises single arm off side of table -shoulder extension, row, horizontal abduction      Shoulder Exercises:  Standing   External Rotation 20 reps    Theraband Level (Shoulder External Rotation) Level 1 (Yellow)    Internal Rotation 20 reps    Theraband Level (Shoulder Internal Rotation) Level 2 (Red)    Shoulder Flexion Weight (lbs) yellow x 15    Extension Strengthening;Both;20 reps;Theraband    Theraband Level (Shoulder Extension) Level 3 (Green)    Row Strengthening;Right;Left;20 reps;Theraband    Theraband Level (Shoulder Row) Level 3 (Green)    Diagonals Strengthening;Left;10 reps    Theraband Level (Shoulder Diagonals) Level 1 (Yellow)      Shoulder Exercises: Pulleys   Flexion 2 minutes    Scaption 2 minutes      Shoulder Exercises: ROM/Strengthening   UBE (Upper Arm Bike) forward and  reverse, 6 min L 3    Wall Pushups 15 reps    Other ROM/Strengthening Exercises wall slides with pillow case flexion x 10 -intermittent assist from opp UE      Manual Therapy   Passive ROM side lying scap mobs  PT Short Term Goals - 01/05/20 1211      PT SHORT TERM GOAL #1   Title Pt will be I with HEP for PROM, posture and RICE    Status Achieved      PT SHORT TERM GOAL #2   Title Pt will be able to report no pain at rest, sitting, no sling as MD allows    Status Achieved      PT SHORT TERM GOAL #3   Title Pt will understand and adhere to protocol for shoulder activity    Status Achieved             PT Long Term Goals - 01/22/20 0851      PT LONG TERM GOAL #1   Title Pt will be able to lift L arm overhead in standing to 120 deg or more for functional reach in his home    Baseline 105-110 deg with compensation    Time 4    Period Weeks    Status On-going    Target Date 02/19/20      PT LONG TERM GOAL #2   Title Pt will be able to complete ADLs, dressing and grooming with L UE use and without increase in shoulder pain    Baseline can manage without pain most of the time    Time 4    Period Weeks    Status Partially Met    Target Date 02/19/20       PT LONG TERM GOAL #3   Title Pt will be able to use L UE to hold item (close to body) while using Rt UE for fine motor activities    Status Achieved      PT LONG TERM GOAL #4   Title Pt will be I with HEP for shoulder AROM, posture, strength    Status On-going    Target Date 02/19/20      PT LONG TERM GOAL #5   Title Pt will be able to lift arm 5 lbs to above shoulder height for more funtional strength (4-/5, 1 muscle grade )    Baseline 3-/5    Time 4    Period Weeks    Status New    Target Date 02/19/20                 Plan - 01/28/20 1312    Clinical Impression Statement Standing AROM improving. Able to perform wall slides with assist from opp UE. Improved prone scap activation however fatigues quickly.    PT Next Visit Plan work on strengthening flexion, posture, within ROM , cont manual and prone scap    PT Home Exercise Plan pendulum , PROM table slides, scapular retraction, upper trap stretch,[id number], shoulder flexion and scaption with soup cans           Patient will benefit from skilled therapeutic intervention in order to improve the following deficits and impairments:  Increased fascial restricitons,Pain,Postural dysfunction,Decreased mobility,Decreased range of motion,Decreased strength,Hypomobility,Impaired UE functional use,Impaired flexibility,Increased edema  Visit Diagnosis: Acute pain of left shoulder  Localized edema  Status post left rotator cuff repair  Muscle weakness (generalized)     Problem List Patient Active Problem List   Diagnosis Date Noted  . LVH (left ventricular hypertrophy) 03/06/2018  . Essential hypertension 03/06/2018  . HTN (hypertension), malignant 02/20/2018  . Chest pain 02/20/2018    [name] , PTA 01/28/2020, 1:13 PM  [place], [name],  [address] Phone: [phone]   Fax:  [phone]  Name: Curtis Saunders MRN:  [id number] Date of Birth: 07-04-1955

## 2020-02-02 ENCOUNTER — Other Ambulatory Visit: Payer: Self-pay

## 2020-02-02 ENCOUNTER — Telehealth: Payer: Self-pay | Admitting: Cardiology

## 2020-02-02 ENCOUNTER — Encounter: Payer: Self-pay | Admitting: Physical Therapy

## 2020-02-02 ENCOUNTER — Ambulatory Visit: Payer: Medicaid Other | Admitting: Physical Therapy

## 2020-02-02 DIAGNOSIS — Z9889 Other specified postprocedural states: Secondary | ICD-10-CM

## 2020-02-02 DIAGNOSIS — M6281 Muscle weakness (generalized): Secondary | ICD-10-CM

## 2020-02-02 DIAGNOSIS — R600 Localized edema: Secondary | ICD-10-CM

## 2020-02-02 DIAGNOSIS — M25512 Pain in left shoulder: Secondary | ICD-10-CM

## 2020-02-02 MED ORDER — AMLODIPINE BESYLATE 10 MG PO TABS
10.0000 mg | ORAL_TABLET | Freq: Every day | ORAL | 1 refills | Status: DC
Start: 2020-02-02 — End: 2020-03-11

## 2020-02-02 NOTE — Telephone Encounter (Signed)
*  STAT* If patient is at the pharmacy, call can be transferred to refill team.   1. Which medications need to be refilled? (please list name of each medication and dose if known) amLODipine (NORVASC) 10 MG tablet  2. Which pharmacy/location (including street and city if local pharmacy) is medication to be sent to? [id number] - [place] ([place]), [place] - [address]  3. Do they need a 30 day or 90 day supply? 30  

## 2020-02-02 NOTE — Therapy (Signed)
[place], [name], [address] Phone: [phone]   Fax:  [phone]  Physical Therapy Treatment  Patient Details  Name: Curtis Saunders MRN: [id number] Date of Birth: [birth year] Referring Provider (PT): Dr. Tania Ade    Encounter Date: 02/02/2020   PT End of Session - 02/02/20 1013    Visit Number 22    Number of Visits 27    Date for PT Re-Evaluation 02/19/20    Authorization Type [employer] MCD    Authorization - Visit Number 66    Authorization - Number of Visits 27    PT Start Time 1010    PT Stop Time 1048    PT Time Calculation (min) 38 min           Past Medical History:  Diagnosis Date  . Hypertension     Past Surgical History:  Procedure Laterality Date  . APPENDECTOMY    . EYE SURGERY    . HAND SURGERY    . KNEE SURGERY     x3, x1 R  . LIPOMA EXCISION Left 10/13/2019   Procedure: SHOULDER EXCISION LIPOMA AND ROTATOR CUFF REPAIR;  Surgeon: Tania Ade, MD;  Location: [facility];  Service: Orthopedics;  Laterality: Left;  . TONSILLECTOMY      There were no vitals filed for this visit.   Subjective Assessment - 02/02/20 1011    Subjective Pain is 5/10 and started last night. Not sure why it started hurting.    Currently in Pain? Yes    Pain Score 5     Pain Location Shoulder    Pain Orientation Left    Pain Descriptors / Indicators Aching    Aggravating Factors  not sure    Pain Relieving Factors meds                             [facility] Adult PT Treatment/Exercise - 02/02/20 0001      Shoulder Exercises: Supine   Protraction 10 reps    Flexion 10 reps    Shoulder Flexion Weight (lbs) punch AROM, AAROM cane    Other Supine Exercises small circles for stabilization x 15 x 2      Shoulder Exercises: Sidelying   External Rotation Strengthening;Left;20 reps    External Rotation Weight (lbs) 2    ABduction 10 reps    ABduction Weight  (lbs) assisted      Shoulder Exercises: Standing   External Rotation 20 reps    Theraband Level (Shoulder External Rotation) Level 2 (Red)    Internal Rotation 20 reps    Theraband Level (Shoulder Internal Rotation) Level 3 (Green)    Shoulder Flexion Weight (lbs) yellow x 15    Extension Strengthening;Both;20 reps;Theraband    Theraband Level (Shoulder Extension) Level 3 (Green)    Row Strengthening;Right;Left;20 reps;Theraband    Theraband Level (Shoulder Row) Level 3 (Green)      Shoulder Exercises: Pulleys   Flexion --    Scaption --      Shoulder Exercises: ROM/Strengthening   UBE (Upper Arm Bike) forward and  reverse, 6 min L 3    Other ROM/Strengthening Exercises wall slides with pillow case flexion x 10 -intermittent assist from opp UE      Manual Therapy   Passive ROM all planes , including elbow extensors (biceps )  PT Short Term Goals - 01/05/20 1211      PT SHORT TERM GOAL #1   Title Pt will be I with HEP for PROM, posture and RICE    Status Achieved      PT SHORT TERM GOAL #2   Title Pt will be able to report no pain at rest, sitting, no sling as MD allows    Status Achieved      PT SHORT TERM GOAL #3   Title Pt will understand and adhere to protocol for shoulder activity    Status Achieved             PT Long Term Goals - 01/22/20 0851      PT LONG TERM GOAL #1   Title Pt will be able to lift L arm overhead in standing to 120 deg or more for functional reach in his home    Baseline 105-110 deg with compensation    Time 4    Period Weeks    Status On-going    Target Date 02/19/20      PT LONG TERM GOAL #2   Title Pt will be able to complete ADLs, dressing and grooming with L UE use and without increase in shoulder pain    Baseline can manage without pain most of the time    Time 4    Period Weeks    Status Partially Met    Target Date 02/19/20      PT LONG TERM GOAL #3   Title Pt will be able to use L UE to hold  item (close to body) while using Rt UE for fine motor activities    Status Achieved      PT LONG TERM GOAL #4   Title Pt will be I with HEP for shoulder AROM, posture, strength    Status On-going    Target Date 02/19/20      PT LONG TERM GOAL #5   Title Pt will be able to lift arm 5 lbs to above shoulder height for more funtional strength (4-/5, 1 muscle grade )    Baseline 3-/5    Time 4    Period Weeks    Status New    Target Date 02/19/20                 Plan - 02/02/20 1049    Clinical Impression Statement Pt reports shoulder is huritng today and started last night. Continued with strengthening and ROM. More difficulty with flexion exercises today. C/o anterior shoulder pain.    PT Next Visit Plan work on strengthening flexion, posture, within ROM , cont manual and prone scap    PT Home Exercise Plan pendulum , PROM table slides, scapular retraction, upper trap stretch,[id number], shoulder flexion and scaption with soup cans           Patient will benefit from skilled therapeutic intervention in order to improve the following deficits and impairments:  Increased fascial restricitons,Pain,Postural dysfunction,Decreased mobility,Decreased range of motion,Decreased strength,Hypomobility,Impaired UE functional use,Impaired flexibility,Increased edema  Visit Diagnosis: Acute pain of left shoulder  Localized edema  Status post left rotator cuff repair  Muscle weakness (generalized)     Problem List Patient Active Problem List   Diagnosis Date Noted  . LVH (left ventricular hypertrophy) 03/06/2018  . Essential hypertension 03/06/2018  . HTN (hypertension), malignant 02/20/2018  . Chest pain 02/20/2018    [name] , PTA 02/02/2020, 10:50 AM  [facility] [phone]  [place], [name], [address] Phone: [phone]   Fax:  [phone]  Name: Curtis Saunders MRN: [id number] Date of Birth:  1955-12-29

## 2020-02-04 ENCOUNTER — Encounter: Payer: Self-pay | Admitting: Physical Therapy

## 2020-02-04 ENCOUNTER — Ambulatory Visit: Payer: Medicaid Other | Admitting: Physical Therapy

## 2020-02-04 ENCOUNTER — Other Ambulatory Visit: Payer: Self-pay

## 2020-02-04 DIAGNOSIS — R600 Localized edema: Secondary | ICD-10-CM

## 2020-02-04 DIAGNOSIS — M6281 Muscle weakness (generalized): Secondary | ICD-10-CM

## 2020-02-04 DIAGNOSIS — Z9889 Other specified postprocedural states: Secondary | ICD-10-CM

## 2020-02-04 DIAGNOSIS — M25512 Pain in left shoulder: Secondary | ICD-10-CM | POA: Diagnosis not present

## 2020-02-04 NOTE — Therapy (Addendum)
[place], [name], [address] Phone: [phone]   Fax:  [phone]  Physical Therapy Treatment/Discharge  Patient Details  Name: Curtis Saunders MRN: [id number] Date of Birth: 12/25/1955 Referring Provider (PT): Dr. Tania Ade    Encounter Date: 02/04/2020   PT End of Session - 02/04/20 1019    Visit Number 23    Number of Visits 27    Date for PT Re-Evaluation 02/19/20    Authorization Type [employer] MCD new calendar year - new 27 visit limit starting 01/24/2020    Authorization - Visit Number 4    Authorization - Number of Visits 27    PT Start Time 0355    PT Stop Time 1055    PT Time Calculation (min) 40 min           Past Medical History:  Diagnosis Date  . Hypertension     Past Surgical History:  Procedure Laterality Date  . APPENDECTOMY    . EYE SURGERY    . HAND SURGERY    . KNEE SURGERY     x3, x1 R  . LIPOMA EXCISION Left 10/13/2019   Procedure: SHOULDER EXCISION LIPOMA AND ROTATOR CUFF REPAIR;  Surgeon: Tania Ade, MD;  Location: [facility];  Service: Orthopedics;  Laterality: Left;  . TONSILLECTOMY      There were no vitals filed for this visit.   Subjective Assessment - 02/04/20 1017    Subjective No pain I took two pain pills because I was hurting after sleeping on left side and pushing up with that arm.    Currently in Pain? No/denies    Pain Score --   was 9/10 this morning before pain meds   Aggravating Factors  sleeping on left    Pain Relieving Factors meds              [facility] PT Assessment - 02/04/20 0001      AROM   Left Shoulder Flexion 128 Degrees    Left Shoulder ABduction --   scaption 130     PROM   Left Shoulder Internal Rotation 60 Degrees   @ 45   Left Shoulder External Rotation 60 Degrees   '@45'$                         [facility] Adult PT Treatment/Exercise - 02/04/20 0001      Shoulder Exercises: Supine   Flexion  10 reps    Shoulder Flexion Weight (lbs) punch AROM, pain      Shoulder Exercises: Prone   Retraction Strengthening;Both;15 reps    Extension 10 reps    Extension Weight (lbs) retraction    Other Prone Exercises single arm horizontal abduction      Shoulder Exercises: Sidelying   External Rotation Strengthening;Left;20 reps    External Rotation Weight (lbs) 2    ABduction 20 reps    ABduction Weight (lbs) unassisted      Shoulder Exercises: Standing   External Rotation 20 reps    Theraband Level (Shoulder External Rotation) Level 3 (Green)    Internal Rotation 20 reps    Theraband Level (Shoulder Internal Rotation) Level 3 (Green)    Extension Strengthening;Both;20 reps;Theraband    Theraband Level (Shoulder Extension) Level 3 (Green)    Row Strengthening;Right;Left;20 reps;Theraband    Theraband Level (Shoulder Row) Level 3 (Green)    Shoulder Elevation Limitations UE ranger step and reach flexin and scaption  Other Standing Exercises standing cane flexion and scaption- pain with scaption      Shoulder Exercises: Pulleys   Flexion 2 minutes    Scaption 2 minutes      Shoulder Exercises: ROM/Strengthening   UBE (Upper Arm Bike) forward and  reverse, 6 min L 3      Manual Therapy   Passive ROM PROM flexion, abduction , IR, ER                    PT Short Term Goals - 01/05/20 1211      PT SHORT TERM GOAL #1   Title Pt will be I with HEP for PROM, posture and RICE    Status Achieved      PT SHORT TERM GOAL #2   Title Pt will be able to report no pain at rest, sitting, no sling as MD allows    Status Achieved      PT SHORT TERM GOAL #3   Title Pt will understand and adhere to protocol for shoulder activity    Status Achieved             PT Long Term Goals - 01/22/20 0851      PT LONG TERM GOAL #1   Title Pt will be able to lift L arm overhead in standing to 120 deg or more for functional reach in his home    Baseline 105-110 deg with compensation     Time 4    Period Weeks    Status On-going    Target Date 02/19/20      PT LONG TERM GOAL #2   Title Pt will be able to complete ADLs, dressing and grooming with L UE use and without increase in shoulder pain    Baseline can manage without pain most of the time    Time 4    Period Weeks    Status Partially Met    Target Date 02/19/20      PT LONG TERM GOAL #3   Title Pt will be able to use L UE to hold item (close to body) while using Rt UE for fine motor activities    Status Achieved      PT LONG TERM GOAL #4   Title Pt will be I with HEP for shoulder AROM, posture, strength    Status On-going    Target Date 02/19/20      PT LONG TERM GOAL #5   Title Pt will be able to lift arm 5 lbs to above shoulder height for more funtional strength (4-/5, 1 muscle grade )    Baseline 3-/5    Time 4    Period Weeks    Status New    Target Date 02/19/20                 Plan - 02/04/20 1100    Clinical Impression Statement Pt reports 9/10 pain this morning so he took 2 pain pills and now has no pain. He demonstrates improved over head reaching and improved endurance with therex today. Able to lift into abduction in sidelying without assist today. He still has difficulty with supine shoulder flexion. Worked on [employer] in standing with UE ranger and he tolerated without increased pain.    PT Next Visit Plan work on strengthening flexion, posture, within ROM , cont manual and prone scap    PT Home Exercise Plan pendulum , PROM table slides, scapular retraction, upper trap stretch,[id number], shoulder flexion and  scaption with soup cans           Patient will benefit from skilled therapeutic intervention in order to improve the following deficits and impairments:  Increased fascial restricitons,Pain,Postural dysfunction,Decreased mobility,Decreased range of motion,Decreased strength,Hypomobility,Impaired UE functional use,Impaired flexibility,Increased edema  Visit Diagnosis: No  diagnosis found.     Problem List Patient Active Problem List   Diagnosis Date Noted  . LVH (left ventricular hypertrophy) 03/06/2018  . Essential hypertension 03/06/2018  . HTN (hypertension), malignant 02/20/2018  . Chest pain 02/20/2018    [name], PTA 02/04/2020, 11:04 AM  [facility] [address] [place], [name], [address] Phone: [phone]   Fax:  [phone]  Name: PADRAIG NHAN MRN: [id number] Date of Birth: 01/02/1956  PHYSICAL THERAPY DISCHARGE SUMMARY  Visits from Start of Care: 23  Current functional level related to goals / functional outcomes: See above for most recent measurements   Remaining deficits: Shoulder weakness, ROM, pain    Education / Equipment: HEP, activity restrictions, exercise, RICE  Plan: Patient agrees to discharge.  Patient goals were partially met. Patient is being discharged due to a change in medical status.  ?????    Pt with acute CVA, change in status.    Raeford Razor, PT 03/12/20 10:23 AM Phone: [phone] Fax: [phone]

## 2020-02-11 DIAGNOSIS — I712 Thoracic aortic aneurysm, without rupture: Secondary | ICD-10-CM | POA: Insufficient documentation

## 2020-02-11 DIAGNOSIS — I7121 Aneurysm of the ascending aorta, without rupture: Secondary | ICD-10-CM | POA: Insufficient documentation

## 2020-02-11 NOTE — Progress Notes (Signed)
Cardiology Office Note   Date:  02/12/2020   ID:  Curtis Saunders, DOB 09-24-1955, MRN [id number]  PCP:  Patient, [name]  Cardiologist:   Minus Breeding, MD Referring:  ED provider  Chief Complaint  Patient presents with  . Chest Pain      History of Present Illness: Curtis Saunders is a 65 y.o. male who was referred by ED for evaluation of chest pain.  He was in the ED in late Jan 2021 for chest pain.  I reviewed these records for this visit.    There was no evidence objective evidence of ischemia.   The patient had moderate LVH on echo.  He was noted there to have negative cardiac enzymes.  CT of the chest demonstrated no acute abnormalities.  Apparently because he had headaches he also had a CT of his head which demonstrated no acute abnormalities.  He was treated with morphine, ketorolac and nitroglycerin.    After seeing me I sent him for a coronary CTA and he had no coronary calcium and NL coronaries.  He does have an ascending aortic aneurysm.    He has no new problems since I saw him.  However, his blood pressure is mildly elevated today.  He is not exercising routinely.  He does not eat a good diet. The patient denies any new symptoms such as chest discomfort, neck or arm discomfort. There has been no new shortness of breath, PND or orthopnea. There have been no reported palpitations, presyncope or syncope.   Past Medical History:  Diagnosis Date  . Hypertension     Past Surgical History:  Procedure Laterality Date  . APPENDECTOMY    . EYE SURGERY    . HAND SURGERY    . KNEE SURGERY     x3, x1 R  . LIPOMA EXCISION Left 10/13/2019   Procedure: SHOULDER EXCISION LIPOMA AND ROTATOR CUFF REPAIR;  Surgeon: Tania Ade, MD;  Location: [facility];  Service: Orthopedics;  Laterality: Left;  . TONSILLECTOMY       No current facility-administered medications for this visit.   Current Outpatient Medications  Medication Sig Dispense Refill  .  amLODipine (NORVASC) 10 MG tablet Take 1 tablet (10 mg total) by mouth daily. Patient needs OV for future refills 30 tablet 1  . ramipril (ALTACE) 10 MG capsule Take 1 capsule (10 mg total) by mouth daily. Patient needs OV for future refills (Patient not taking: Reported on 02/12/2020) 30 capsule 1   Facility-Administered Medications Ordered in Other Visits  Medication Dose Route Frequency Provider Last Rate Last Admin  . sodium chloride 0.9 % bolus 500 mL  500 mL Intravenous Once Dorie Rank, MD 999 mL/hr at 02/12/20 1313 500 mL at 02/12/20 1313   Followed by  . 0.9 %  sodium chloride infusion  1,000 mL Intravenous Continuous Dorie Rank, MD      . acetaminophen (TYLENOL) tablet 650 mg  650 mg Oral Once Dorie Rank, MD        Allergies:   Bee venom   ROS:  Please see the history of present illness.   Otherwise, review of systems are positive for none.   All other systems are reviewed and negative.    PHYSICAL EXAM: VS:  BP (!) 142/71   Pulse 78   Ht 6' (1.829 m)   Wt 211 lb 9.6 oz (96 kg)   SpO2 96%   BMI 28.70 kg/m  , BMI Body mass index  is 28.7 kg/m.  GENERAL:  Well appearing NECK:  No jugular venous distention, waveform within normal limits, carotid upstroke brisk and symmetric, no bruits, no thyromegaly LUNGS:  Clear to auscultation bilaterally CHEST:  Unremarkable HEART:  PMI not displaced or sustained,S1 and S2 within normal limits, no S3, no S4, no clicks, no rubs, no murmurs ABD:  Flat, positive bowel sounds normal in frequency in pitch, no bruits, no rebound, no guarding, no midline pulsatile mass, no hepatomegaly, no splenomegaly EXT:  2 plus pulses throughout, no edema, no cyanosis no clubbing   EKG:  EKG is  ordered today. The ekg ordered today demonstrates sinus bradycardia, rate 78 axis within normal limits, left ventricular hypertrophy by voltage criteria, T wave inversions consistent with repolarization changes.  No change from previous  Recent Labs: 02/22/2019: ALT  21; Platelets 175 05/31/2019: BUN 12; Creatinine, Ser 1.30; Hemoglobin 14.6; Potassium 3.8; Sodium 144    Lipid Panel    Component Value Date/Time   CHOL 165 02/20/2018 1714   TRIG 47 02/20/2018 1714   HDL 56 02/20/2018 1714   CHOLHDL 2.9 02/20/2018 1714   VLDL 9 02/20/2018 1714   LDLCALC 100 (H) 02/20/2018 1714      Wt Readings from Last 3 Encounters:  02/12/20 211 lb 9.6 oz (96 kg)  10/13/19 216 lb 4.3 oz (98.1 kg)  08/08/19 208 lb (94.3 kg)      Other studies Reviewed: Additional studies/ records that were reviewed today include: Echo, CT Review of the above records demonstrates:  Please see elsewhere in the note.     ASSESSMENT AND PLAN:  CHEST PAIN:     He had normal coronaries on CTA.    The patient's chest pain was atypical.  No change in therapy.  He needs primary risk reduction.   HTN: His blood pressure is mildly elevated today.  He is instructed to get a blood pressure cuff and how to keep a blood pressure diary and present with these results.  Might need med titration.   LVH: He has evidence of endorgan damage and we discussed this today.  I am going to get him a suggestion on a low-salt Mediterranean diet and some instructions on this.  I will follow this with echocardiography in the future.   AORTIC ANEURYSM:  This was 4.0 cm on CT. he needs to have a follow-up scheduled for May.  CKD II: He does have some mild renal insufficiency.  We will follow this with blood work.  His creatinine was 1.39 in January.  ADDENDUM: After leaving the appointment the patient was at checkout.  He had a near syncopal episode.  He became diaphoretic.  He did not lose consciousness but was minimally responsive.  He was standing and then we sat him.  We eased him to the floor.  Blood pressure was 130/70.  Heart rate was in the 80s.  Oxygen saturations were excellent.  A follow-up EKG demonstrated no change from the 1 done earlier.  His exam was unremarkable including no focal neurologic  deficits though he remains sluggishly responsive to questions he was oriented to person place and time.  Blood sugar was unremarkable.  EMS was called and he was transported to the emergency room.  Current medicines are reviewed at length with the patient today.  The patient does not have concerns regarding medicines.  The following changes have been made:  As above.  Labs/ tests ordered today include:   Orders Placed This Encounter  Procedures  .  CT ANGIO CHEST AORTA W/CM & OR WO/CM  . Basic metabolic panel  . EKG 12-Lead     Disposition:   FU with APP in six months.   Signed, Minus Breeding, MD  02/12/2020 1:15 PM    [facility]

## 2020-02-12 ENCOUNTER — Other Ambulatory Visit: Payer: Self-pay

## 2020-02-12 ENCOUNTER — Encounter (HOSPITAL_COMMUNITY): Payer: Self-pay | Admitting: Emergency Medicine

## 2020-02-12 ENCOUNTER — Encounter: Payer: Self-pay | Admitting: Cardiology

## 2020-02-12 ENCOUNTER — Ambulatory Visit (INDEPENDENT_AMBULATORY_CARE_PROVIDER_SITE_OTHER): Payer: Medicaid Other | Admitting: Cardiology

## 2020-02-12 ENCOUNTER — Emergency Department (HOSPITAL_COMMUNITY): Payer: Medicaid Other

## 2020-02-12 ENCOUNTER — Emergency Department (HOSPITAL_COMMUNITY)
Admission: EM | Admit: 2020-02-12 | Discharge: 2020-02-12 | Disposition: A | Discharge status: Home / Self Care | Payer: Medicaid Other | Attending: Emergency Medicine | Admitting: Emergency Medicine

## 2020-02-12 VITALS — BP 142/71 | HR 78 | Ht 72.0 in | Wt 211.6 lb

## 2020-02-12 DIAGNOSIS — I517 Cardiomegaly: Secondary | ICD-10-CM

## 2020-02-12 DIAGNOSIS — R072 Precordial pain: Secondary | ICD-10-CM | POA: Diagnosis not present

## 2020-02-12 DIAGNOSIS — I712 Thoracic aortic aneurysm, without rupture: Secondary | ICD-10-CM | POA: Diagnosis not present

## 2020-02-12 DIAGNOSIS — I10 Essential (primary) hypertension: Secondary | ICD-10-CM

## 2020-02-12 DIAGNOSIS — U071 COVID-19: Secondary | ICD-10-CM | POA: Insufficient documentation

## 2020-02-12 DIAGNOSIS — I7121 Aneurysm of the ascending aorta, without rupture: Secondary | ICD-10-CM

## 2020-02-12 DIAGNOSIS — Z79899 Other long term (current) drug therapy: Secondary | ICD-10-CM | POA: Insufficient documentation

## 2020-02-12 DIAGNOSIS — R55 Syncope and collapse: Secondary | ICD-10-CM

## 2020-02-12 LAB — APTT: aPTT: 29 seconds (ref 24–36)

## 2020-02-12 LAB — HEMOGLOBIN A1C
Hgb A1c MFr Bld: 5.8 % — ABNORMAL HIGH (ref 4.8–5.6)
Mean Plasma Glucose: 119.76 mg/dL

## 2020-02-12 LAB — CBC WITH DIFFERENTIAL/PLATELET
Abs Immature Granulocytes: 0.03 10*3/uL (ref 0.00–0.07)
Basophils Absolute: 0 10*3/uL (ref 0.0–0.1)
Basophils Relative: 0 %
Eosinophils Absolute: 0 10*3/uL (ref 0.0–0.5)
Eosinophils Relative: 0 %
HCT: 40.7 % (ref 39.0–52.0)
Hemoglobin: 13.2 g/dL (ref 13.0–17.0)
Immature Granulocytes: 0 %
Lymphocytes Relative: 23 %
Lymphs Abs: 1.7 10*3/uL (ref 0.7–4.0)
MCH: 29.1 pg (ref 26.0–34.0)
MCHC: 32.4 g/dL (ref 30.0–36.0)
MCV: 89.6 fL (ref 80.0–100.0)
Monocytes Absolute: 0.6 10*3/uL (ref 0.1–1.0)
Monocytes Relative: 8 %
Neutro Abs: 4.9 10*3/uL (ref 1.7–7.7)
Neutrophils Relative %: 69 %
Platelets: 145 10*3/uL — ABNORMAL LOW (ref 150–400)
RBC: 4.54 MIL/uL (ref 4.22–5.81)
RDW: 13.7 % (ref 11.5–15.5)
WBC: 7.3 10*3/uL (ref 4.0–10.5)
nRBC: 0 % (ref 0.0–0.2)

## 2020-02-12 LAB — COMPREHENSIVE METABOLIC PANEL
ALT: 37 U/L (ref 0–44)
AST: 35 U/L (ref 15–41)
Albumin: 3.5 g/dL (ref 3.5–5.0)
Alkaline Phosphatase: 76 U/L (ref 38–126)
Anion gap: 10 (ref 5–15)
BUN: 16 mg/dL (ref 8–23)
CO2: 24 mmol/L (ref 22–32)
Calcium: 9 mg/dL (ref 8.9–10.3)
Chloride: 105 mmol/L (ref 98–111)
Creatinine, Ser: 1.57 mg/dL — ABNORMAL HIGH (ref 0.61–1.24)
GFR, Estimated: 50 mL/min — ABNORMAL LOW (ref >60)
Glucose, Bld: 127 mg/dL — ABNORMAL HIGH (ref 70–99)
Potassium: 4.1 mmol/L (ref 3.5–5.1)
Sodium: 139 mmol/L (ref 135–145)
Total Bilirubin: 0.9 mg/dL (ref 0.3–1.2)
Total Protein: 6.9 g/dL (ref 6.5–8.1)

## 2020-02-12 LAB — LIPID PANEL
Cholesterol: 156 mg/dL (ref 0–200)
HDL: 36 mg/dL — ABNORMAL LOW (ref >40)
LDL Cholesterol: 102 mg/dL — ABNORMAL HIGH (ref 0–99)
Total CHOL/HDL Ratio: 4.3 RATIO
Triglycerides: 89 mg/dL (ref <150)
VLDL: 18 mg/dL (ref 0–40)

## 2020-02-12 LAB — PROTIME-INR
INR: 1 (ref 0.8–1.2)
Prothrombin Time: 13.2 seconds (ref 11.4–15.2)

## 2020-02-12 LAB — TROPONIN I (HIGH SENSITIVITY): Troponin I (High Sensitivity): 13 ng/L (ref <18)

## 2020-02-12 LAB — SARS CORONAVIRUS 2 BY RT PCR (HOSPITAL ORDER, PERFORMED IN ~~LOC~~ HOSPITAL LAB): SARS Coronavirus 2: POSITIVE — AB

## 2020-02-12 SURGERY — LEFT HEART CATH AND CORONARY ANGIOGRAPHY
Anesthesia: LOCAL

## 2020-02-12 MED ORDER — SODIUM CHLORIDE 0.9 % IV SOLN
1000.0000 mL | INTRAVENOUS | Status: DC
  Administered 2020-02-12: 1000 mL via INTRAVENOUS

## 2020-02-12 MED ORDER — SODIUM CHLORIDE 0.9 % IV BOLUS (SEPSIS)
500.0000 mL | Freq: Once | INTRAVENOUS | Status: AC
  Administered 2020-02-12: 500 mL via INTRAVENOUS

## 2020-02-12 MED ORDER — ONDANSETRON HCL 4 MG/2ML IJ SOLN
4.0000 mg | Freq: Once | INTRAMUSCULAR | Status: AC
  Administered 2020-02-12: 4 mg via INTRAVENOUS
  Filled 2020-02-12: qty 2

## 2020-02-12 MED ORDER — ACETAMINOPHEN 325 MG PO TABS
650.0000 mg | ORAL_TABLET | Freq: Once | ORAL | Status: AC
  Administered 2020-02-12: 650 mg via ORAL
  Filled 2020-02-12: qty 2

## 2020-02-12 NOTE — Patient Instructions (Signed)
Medication Instructions:  The current medical regimen is effective;  continue present plan and medications.  *If you need a refill on your cardiac medications before your next appointment, please call your pharmacy*  Testing/Procedures: CT ANGIO AORTA   Follow-Up: At [facility], you and your health needs are our priority.  As part of our continuing mission to provide you with exceptional heart care, we have created designated Provider Care Teams.  These Care Teams include your primary Cardiologist (physician) and Advanced Practice Providers (APPs -  Physician Assistants and Nurse Practitioners) who all work together to provide you with the care you need, when you need it.  We recommend signing up for the patient portal called "MyChart".  Sign up information is provided on this After Visit Summary.  MyChart is used to connect with patients for Virtual Visits (Telemedicine).  Patients are able to view lab/test results, encounter notes, upcoming appointments, etc.  Non-urgent messages can be sent to your provider as well.   To learn more about what you can do with MyChart, go to [email or web address].    Your next appointment:   May 2022  The format for your next appointment:   In Person  Provider:   You may see Minus Breeding, MD or one of the following Advanced Practice Providers on your designated Care Team:    Rosaria Ferries, PA-C  Jory Sims, DNP, ANP   Other Instructions  Purchase a Omron Blood Pressure Cuff    Mediterranean Diet A Mediterranean diet refers to food and lifestyle choices that are based on the traditions of countries located on the [employer]. This way of eating has been shown to help prevent certain conditions and improve outcomes for people who have chronic diseases, like kidney disease and heart disease. What are tips for following this plan? Lifestyle  Cook and eat meals together with your family, when possible.  Drink enough fluid to  keep your urine clear or pale yellow.  Be physically active every day. This includes: ? Aerobic exercise like running or swimming. ? Leisure activities like gardening, walking, or housework.  Get 7-8 hours of sleep each night.  If recommended by your health care provider, drink red wine in moderation. This means 1 glass a day for nonpregnant women and 2 glasses a day for men. A glass of wine equals 5 oz (150 mL). Reading food labels  Check the serving size of packaged foods. For foods such as rice and pasta, the serving size refers to the amount of cooked product, not dry.  Check the total fat in packaged foods. Avoid foods that have saturated fat or trans fats.  Check the ingredients list for added sugars, such as corn syrup.   Shopping  At the grocery store, buy most of your food from the areas near the walls of the store. This includes: ? Fresh fruits and vegetables (produce). ? Grains, beans, nuts, and seeds. Some of these may be available in unpackaged forms or large amounts (in bulk). ? Fresh seafood. ? Poultry and eggs. ? Low-fat dairy products.  Buy whole ingredients instead of prepackaged foods.  Buy fresh fruits and vegetables in-season from local farmers markets.  Buy frozen fruits and vegetables in resealable bags.  If you do not have access to quality fresh seafood, buy precooked frozen shrimp or canned fish, such as tuna, salmon, or sardines.  Buy small amounts of raw or cooked vegetables, salads, or olives from the deli or salad bar at your store.  Stock your  pantry so you always have certain foods on hand, such as olive oil, canned tuna, canned tomatoes, rice, pasta, and beans. Cooking  Cook foods with extra-virgin olive oil instead of using butter or other vegetable oils.  Have meat as a side dish, and have vegetables or grains as your main dish. This means having meat in small portions or adding small amounts of meat to foods like pasta or stew.  Use beans  or vegetables instead of meat in common dishes like chili or lasagna.  Experiment with different cooking methods. Try roasting or broiling vegetables instead of steaming or sauteing them.  Add frozen vegetables to soups, stews, pasta, or rice.  Add nuts or seeds for added healthy fat at each meal. You can add these to yogurt, salads, or vegetable dishes.  Marinate fish or vegetables using olive oil, lemon juice, garlic, and fresh herbs. Meal planning  Plan to eat 1 vegetarian meal one day each week. Try to work up to 2 vegetarian meals, if possible.  Eat seafood 2 or more times a week.  Have healthy snacks readily available, such as: ? Vegetable sticks with hummus. ? [place] yogurt. ? Fruit and nut trail mix.  Eat balanced meals throughout the week. This includes: ? Fruit: 2-3 servings a day ? Vegetables: 4-5 servings a day ? Low-fat dairy: 2 servings a day ? Fish, poultry, or lean meat: 1 serving a day ? Beans and legumes: 2 or more servings a week ? Nuts and seeds: 1-2 servings a day ? Whole grains: 6-8 servings a day ? Extra-virgin olive oil: 3-4 servings a day  Limit red meat and sweets to only a few servings a month   What are my food choices?  Mediterranean diet ? Recommended  Grains: Whole-grain pasta. Brown rice. Bulgar wheat. Polenta. Couscous. Whole-wheat bread. [place].  Vegetables: Artichokes. Beets. Broccoli. Cabbage. Carrots. Eggplant. Green beans. Chard. Kale. Spinach. Onions. Leeks. Peas. Squash. Tomatoes. Peppers. Radishes.  Fruits: Apples. Apricots. Avocado. Berries. Bananas. Cherries. Dates. Figs. Grapes. Lemons. Melon. Oranges. Peaches. Plums. Pomegranate.  Meats and other protein foods: Beans. Almonds. Sunflower seeds. Pine nuts. Peanuts. [place]. Salmon. Scallops. Shrimp. [place]. Tilapia. Clams. Oysters. Eggs.  Dairy: Low-fat milk. Cheese. Greek yogurt.  Beverages: Water. Red wine. Herbal tea.  Fats and oils: Extra virgin olive oil. Avocado oil.  Grape seed oil.  Sweets and desserts: [place] yogurt with honey. Baked apples. Poached pears. Trail mix.  Seasoning and other foods: Basil. Cilantro. Coriander. Cumin. Mint. Parsley. Sage. Rosemary. Tarragon. Garlic. Oregano. Thyme. Pepper. Balsalmic vinegar. Tahini. Hummus. Tomato sauce. Olives. Mushrooms. ? Limit these  Grains: Prepackaged pasta or rice dishes. Prepackaged cereal with added sugar.  Vegetables: Deep fried potatoes (french fries).  Fruits: Fruit canned in syrup.  Meats and other protein foods: Beef. Pork. Lamb. Poultry with skin. Hot dogs. Bernie[place] Salines.  Dairy: Ice cream. Sour cream. Whole milk.  Beverages: Juice. Sugar-sweetened soft drinks. Beer. Liquor and spirits.  Fats and oils: Butter. Canola oil. Vegetable oil. Beef fat (tallow). Lard.  Sweets and desserts: Cookies. Cakes. Pies. Candy.  Seasoning and other foods: Mayonnaise. Premade sauces and marinades. The items listed may not be a complete list. Talk with your dietitian about what dietary choices are right for you. Summary  The Mediterranean diet includes both food and lifestyle choices.  Eat a variety of fresh fruits and vegetables, beans, nuts, seeds, and whole grains.  Limit the amount of red meat and sweets that you eat.  Talk with your health care provider  about whether it is safe for you to drink red wine in moderation. This means 1 glass a day for nonpregnant women and 2 glasses a day for men. A glass of wine equals 5 oz (150 mL). This information is not intended to replace advice given to you by your health care provider. Make sure you discuss any questions you have with your health care provider. Document Revised: 09/09/2015 Document Reviewed: 09/02/2015 Elsevier Patient Education  [place].

## 2020-02-12 NOTE — ED Triage Notes (Signed)
Patient BIB [facility]. Patient had syncopal episode at cardiology office. EMS EKG showed elevation meeting STEMI criteria. Patient received 324 asa. No nitro d/t viagra use. Denied chest pain.

## 2020-02-12 NOTE — ED Provider Notes (Signed)
[facility] EMERGENCY DEPARTMENT Provider Note   CSN: [id number] Arrival date & time: 02/12/20  1206     History Chief Complaint  Patient presents with  . Near Syncope    Curtis Saunders is a 65 y.o. male.  HPI   Patient presented to the emergency room after a near syncopal episode.  Patient was in the cardiologist office today for routine follow-up.  Patient had previous history of chest pain.  He had a coronary CTA that did not show any acute abnormalities.  Patient did have an ascending aortic aneurysm noted on his CTA at that time.  Patient was there today for his routine follow-up 1 year later.  Patient initially did not have any complaints.  Patient was discharged from that visit today and plan was for him to follow-up in 6 months.  Apparently the patient started to feel diaphoretic and weak.  He felt like he was going to pass out.  EMS was called.  They did an EKG and they were concerned about the possibility of ST elevation MI.  They activated code STEMI.  This EKG was reviewed by Dr. Ellyn Hack and the STEMI was canceled.  Patient is not having any chest pain.  He states he just feels lousy.  He is not having any coughing.  He is not having any fevers.  He denies any headache chest pain abdominal pain vomiting or diarrhea. Past Medical History:  Diagnosis Date  . Hypertension     Patient Active Problem List   Diagnosis Date Noted  . Ascending aortic aneurysm ([place]) 02/11/2020  . LVH (left ventricular hypertrophy) 03/06/2018  . Essential hypertension 03/06/2018  . HTN (hypertension), malignant 02/20/2018  . Chest pain 02/20/2018    Past Surgical History:  Procedure Laterality Date  . APPENDECTOMY    . EYE SURGERY    . HAND SURGERY    . KNEE SURGERY     x3, x1 R  . LIPOMA EXCISION Left 10/13/2019   Procedure: SHOULDER EXCISION LIPOMA AND ROTATOR CUFF REPAIR;  Surgeon: Tania Ade, MD;  Location: [place];  Service: Orthopedics;   Laterality: Left;  . TONSILLECTOMY         Family History  Problem Relation Age of Onset  . Heart attack Mother 50  . COPD Mother   . Emphysema Mother   . Hypertension Sister     Social History   Tobacco Use  . Smoking status: Never Smoker  . Smokeless tobacco: Never Used  Vaping Use  . Vaping Use: Never used  Substance Use Topics  . Alcohol use: Yes    Comment: socially  . Drug use: No    Home Medications Prior to Admission medications   Medication Sig Start Date End Date Taking? Authorizing Provider  amLODipine (NORVASC) 10 MG tablet Take 1 tablet (10 mg total) by mouth daily. Patient needs OV for future refills 02/02/20  Yes Minus Breeding, MD  ramipril (ALTACE) 10 MG capsule Take 1 capsule (10 mg total) by mouth daily. Patient needs OV for future refills Patient not taking: Reported on 02/12/2020 12/22/19 03/21/20  Minus Breeding, MD  sildenafil (VIAGRA) 100 MG tablet TAKE 1 TABLET BY MOUTH ONCE DAILY AS NEEDED FOR ERECTILE DYSFUNCTION Patient not taking: No sig reported 06/05/18 08/08/19  Minus Breeding, MD    Allergies    Bee venom  Review of Systems   Review of Systems  All other systems reviewed and are negative.   Physical Exam Updated Vital Signs  BP (!) 151/99   Pulse 72   Temp (!) 97.2 F (36.2 C) (Temporal)   Resp 19   Ht 1.829 m (6')   Wt 95.9 kg   SpO2 96%   BMI 28.67 kg/m   Physical Exam Vitals and nursing note reviewed.  Constitutional:      General: He is not in acute distress.    Appearance: He is well-developed and well-nourished.  HENT:     Head: Normocephalic and atraumatic.     Right Ear: External ear normal.     Left Ear: External ear normal.  Eyes:     General: No scleral icterus.       Right eye: No discharge.        Left eye: No discharge.     Conjunctiva/sclera: Conjunctivae normal.  Neck:     Trachea: No tracheal deviation.  Cardiovascular:     Rate and Rhythm: Normal rate and regular rhythm.     Pulses: Intact  distal pulses.  Pulmonary:     Effort: Pulmonary effort is normal. No respiratory distress.     Breath sounds: Normal breath sounds. No stridor. No wheezing or rales.  Abdominal:     General: Bowel sounds are normal. There is no distension.     Palpations: Abdomen is soft.     Tenderness: There is no abdominal tenderness. There is no guarding or rebound.  Musculoskeletal:        General: No tenderness or edema.     Cervical back: Neck supple.  Skin:    General: Skin is warm and dry.     Findings: No rash.  Neurological:     Mental Status: He is alert.     Cranial Nerves: No cranial nerve deficit (no facial droop, extraocular movements intact, no slurred speech).     Sensory: No sensory deficit.     Motor: No abnormal muscle tone or seizure activity.     Coordination: Coordination normal.     Deep Tendon Reflexes: Strength normal.  Psychiatric:        Mood and Affect: Mood and affect normal.     ED Results / Procedures / Treatments   Labs (all labs ordered are listed, but only abnormal results are displayed) Labs Reviewed  SARS CORONAVIRUS 2 BY RT PCR (HOSPITAL ORDER, [facility]) - Abnormal; Notable for the following components:      Result Value   SARS Coronavirus 2 POSITIVE (*)    All other components within normal limits  HEMOGLOBIN A1C - Abnormal; Notable for the following components:   Hgb A1c MFr Bld 5.8 (*)    All other components within normal limits  CBC WITH DIFFERENTIAL/PLATELET - Abnormal; Notable for the following components:   Platelets 145 (*)    All other components within normal limits  COMPREHENSIVE METABOLIC PANEL - Abnormal; Notable for the following components:   Glucose, Bld 127 (*)    Creatinine, Ser 1.57 (*)    GFR, Estimated 50 (*)    All other components within normal limits  LIPID PANEL - Abnormal; Notable for the following components:   HDL 36 (*)    LDL Cholesterol 102 (*)    All other components within normal  limits  PROTIME-INR  APTT  TROPONIN I (HIGH SENSITIVITY)    EKG EKG Interpretation  Date/Time:  Thursday February 12 2020 12:11:02 EST Ventricular Rate:  66 PR Interval:    QRS Duration: 102 QT Interval:  383 QTC Calculation: 402 R  Axis:   66 Text Interpretation: Sinus rhythm Borderline ST elevation, anterior leads No significant change since last tracing Confirmed by Dorie Rank [phone]) on 02/12/2020 12:27:02 PM   Radiology DG Chest Portable 1 View  Result Date: 02/12/2020 CLINICAL DATA:  Syncope EXAM: PORTABLE CHEST 1 VIEW COMPARISON:  Chest radiograph and chest CT February 20, 2018 FINDINGS: There is a subtle area of opacity in the right mid lung. The lungs elsewhere are clear. Heart is upper normal in size with pulmonary vascularity normal. No adenopathy. No bone lesions. IMPRESSION: Subtle increased opacity right mid lung which may represent developing pneumonia. This finding may warrant check of COVID-19 status. Lungs otherwise clear. Heart upper normal in size. No adenopathy. Electronically Signed   By: Lowella Grip III M.D.   On: 02/12/2020 12:58    Procedures Procedures (including critical care time)  Medications Ordered in ED Medications  sodium chloride 0.9 % bolus 500 mL (0 mLs Intravenous Stopped 02/12/20 1342)    Followed by  0.9 %  sodium chloride infusion (1,000 mLs Intravenous New Bag/Given 02/12/20 1343)  acetaminophen (TYLENOL) tablet 650 mg (650 mg Oral Given 02/12/20 1341)  ondansetron (ZOFRAN) injection 4 mg (4 mg Intravenous Given 02/12/20 1338)    ED Course  I have reviewed the triage vital signs and the nursing notes.  Pertinent labs & imaging results that were available during my care of the patient were reviewed by me and considered in my medical decision making (see chart for details).  Clinical Course as of 02/12/20 1457  Thu Feb 12, 2020  1408 CBC is normal.  Covid is positive. [JK]  1409 Subtle opacity on x-ray.  Possible early COVID-pneumonia  [JK]  16 Patient's metabolic panel notable for creatinine 1.57. Slightly higher than previous. CBC unremarkable. Initial troponin was negative. [JK]    Clinical Course User Index [JK] Dorie Rank, MD   MDM Rules/Calculators/A&P                          Patient presented to the ED for evaluation after syncopal episode at the cardiologist office.  Patient did have onset of feeling diaphoretic and hot.  Patient denied any other symptoms.   Initially there was some concerns about his EKG, EMS activated as a code STEMI.  Patient was not having any trouble with chest pain or shortness of breath.  Cardiology team reviewed the EKG and canceled code STEMI.  On my review EKG is not showing any acute changes.  Patient's laboratory tests were otherwise reassuring.  He has stable renal insufficiency.  Patient's COVID test however is positive.  This accounts for his complaints of generalized malaise.  I suspect he had a vasovagal episode.  Patient has been vaccinated with the J&J vaccine.  Did discuss remdesivir with him but he is not interested in IV therapy patient also does not have multiple risk factors.  Will discharge home with a referral to the ambulatory covid clinic regarding possible outpatient treatment. Final Clinical Impression(s) / ED Diagnoses Final diagnoses:  [id number] virus infection  Syncope, unspecified syncope type    Rx / DC Orders ED Discharge Orders         Ordered    Ambulatory referral for Covid Treatment        02/12/20 1455           Dorie Rank, MD 02/12/20 1457

## 2020-02-12 NOTE — Discharge Instructions (Addendum)
Try to stay hydrated. Take Tylenol as needed for aches pains or fevers. Return to the ED if you start having difficulty with your breathing.

## 2020-02-13 ENCOUNTER — Telehealth: Payer: Self-pay | Admitting: Cardiology

## 2020-02-13 ENCOUNTER — Other Ambulatory Visit: Payer: Self-pay | Admitting: *Deleted

## 2020-02-13 MED ORDER — RAMIPRIL 10 MG PO CAPS: 10.0000 mg | ORAL_CAPSULE | Freq: Every day | ORAL | 1 refills | Status: DC

## 2020-02-13 NOTE — Telephone Encounter (Signed)
*  STAT* If patient is at the pharmacy, call can be transferred to refill team.   1. Which medications need to be refilled? (please list name of each medication and dose if known)  ramipril (ALTACE) 10 MG capsule  2. Which pharmacy/location (including street and city if local pharmacy) is medication to be sent to? [place] ([place]), [place] - [address]  3. Do they need a 30 day or 90 day supply? 90  Patient said he was at Dr. Rosezella [place] office yesterday but did not get a refill sent to his pharmacy.

## 2020-02-17 ENCOUNTER — Ambulatory Visit: Payer: Medicaid Other | Admitting: Physical Therapy

## 2020-02-19 ENCOUNTER — Ambulatory Visit: Payer: Medicaid Other | Admitting: Physical Therapy

## 2020-02-20 ENCOUNTER — Emergency Department (HOSPITAL_COMMUNITY): Payer: Medicaid Other

## 2020-02-20 ENCOUNTER — Emergency Department (HOSPITAL_COMMUNITY)
Admission: EM | Admit: 2020-02-20 | Discharge: 2020-02-20 | Disposition: A | Discharge status: Home / Self Care | Payer: Medicaid Other | Attending: Emergency Medicine | Admitting: Emergency Medicine

## 2020-02-20 ENCOUNTER — Encounter (HOSPITAL_COMMUNITY): Payer: Self-pay | Admitting: Emergency Medicine

## 2020-02-20 ENCOUNTER — Other Ambulatory Visit: Payer: Self-pay

## 2020-02-20 DIAGNOSIS — I10 Essential (primary) hypertension: Secondary | ICD-10-CM | POA: Insufficient documentation

## 2020-02-20 DIAGNOSIS — N50811 Right testicular pain: Secondary | ICD-10-CM | POA: Diagnosis present

## 2020-02-20 DIAGNOSIS — Z79899 Other long term (current) drug therapy: Secondary | ICD-10-CM | POA: Insufficient documentation

## 2020-02-20 DIAGNOSIS — N433 Hydrocele, unspecified: Secondary | ICD-10-CM

## 2020-02-20 LAB — CBC
HCT: 40.9 % (ref 39.0–52.0)
Hemoglobin: 13.4 g/dL (ref 13.0–17.0)
MCH: 29.3 pg (ref 26.0–34.0)
MCHC: 32.8 g/dL (ref 30.0–36.0)
MCV: 89.5 fL (ref 80.0–100.0)
Platelets: 352 10*3/uL (ref 150–400)
RBC: 4.57 MIL/uL (ref 4.22–5.81)
RDW: 13.8 % (ref 11.5–15.5)
WBC: 11.9 10*3/uL — ABNORMAL HIGH (ref 4.0–10.5)
nRBC: 0 % (ref 0.0–0.2)

## 2020-02-20 LAB — BASIC METABOLIC PANEL
Anion gap: 10 (ref 5–15)
BUN: 17 mg/dL (ref 8–23)
CO2: 23 mmol/L (ref 22–32)
Calcium: 9.4 mg/dL (ref 8.9–10.3)
Chloride: 109 mmol/L (ref 98–111)
Creatinine, Ser: 1.18 mg/dL (ref 0.61–1.24)
GFR, Estimated: >60 mL/min (ref >60)
Glucose, Bld: 155 mg/dL — ABNORMAL HIGH (ref 70–99)
Potassium: 3.7 mmol/L (ref 3.5–5.1)
Sodium: 142 mmol/L (ref 135–145)

## 2020-02-20 LAB — URINALYSIS, ROUTINE W REFLEX MICROSCOPIC
Bilirubin Urine: NEGATIVE
Glucose, UA: NEGATIVE mg/dL
Hgb urine dipstick: NEGATIVE
Ketones, ur: NEGATIVE mg/dL
Leukocytes,Ua: NEGATIVE
Nitrite: NEGATIVE
Protein, ur: NEGATIVE mg/dL
Specific Gravity, Urine: 1.023 (ref 1.005–1.030)
pH: 5 (ref 5.0–8.0)

## 2020-02-20 MED ORDER — CIPROFLOXACIN HCL 500 MG PO TABS: 500.0000 mg | ORAL_TABLET | Freq: Two times a day (BID) | ORAL | 0 refills | Status: DC

## 2020-02-20 NOTE — Discharge Instructions (Signed)
Take the antibiotics as prescribed.  Follow-up with your primary care doctor or consider seeing urologist to be rechecked if the symptoms persist

## 2020-02-20 NOTE — ED Provider Notes (Signed)
[facility] DEPT Provider Note   CSN: [id number] Arrival date & time: 02/20/20  [id number]     History Chief Complaint  Patient presents with  . Groin Swelling    Curtis Saunders is a 65 y.o. male.  HPI   Patient presents to the ED for evaluation of right testicle pain.  Patient states symptoms started this morning.  He noticed that his right testicle is swollen and tender.  He also had some discomfort with urination this morning.  He denies any vomiting or diarrhea.  No nausea.  He denies any penile discharge.  Patient denies any similar symptoms in the past.  Patient was diagnosed with covid on the eighth.  Patient states he has not really had any symptoms since then.  He is not coughing not short of breath.    Past Medical History:  Diagnosis Date  . Hypertension     Patient Active Problem List   Diagnosis Date Noted  . Ascending aortic aneurysm ([place]) 02/11/2020  . LVH (left ventricular hypertrophy) 03/06/2018  . Essential hypertension 03/06/2018  . HTN (hypertension), malignant 02/20/2018  . Chest pain 02/20/2018    Past Surgical History:  Procedure Laterality Date  . APPENDECTOMY    . EYE SURGERY    . HAND SURGERY    . KNEE SURGERY     x3, x1 R  . LIPOMA EXCISION Left 10/13/2019   Procedure: SHOULDER EXCISION LIPOMA AND ROTATOR CUFF REPAIR;  Surgeon: Tania Ade, MD;  Location: [facility];  Service: Orthopedics;  Laterality: Left;  . TONSILLECTOMY         Family History  Problem Relation Age of Onset  . Heart attack Mother 22  . COPD Mother   . Emphysema Mother   . Hypertension Sister     Social History   Tobacco Use  . Smoking status: Never Smoker  . Smokeless tobacco: Never Used  Vaping Use  . Vaping Use: Never used  Substance Use Topics  . Alcohol use: Yes    Comment: socially  . Drug use: No    Home Medications Prior to Admission medications   Medication Sig Start Date End Date Taking?  Authorizing Provider  amLODipine (NORVASC) 10 MG tablet Take 1 tablet (10 mg total) by mouth daily. Patient needs OV for future refills 02/02/20  Yes Minus Breeding, MD  ciprofloxacin (CIPRO) 500 MG tablet Take 1 tablet (500 mg total) by mouth 2 (two) times daily. 02/20/20  Yes Dorie Rank, MD  ramipril (ALTACE) 10 MG capsule Take 1 capsule (10 mg total) by mouth daily. Patient needs OV for future refills 02/13/20 05/13/20 Yes Hochrein, Jeneen Rinks, MD  sildenafil (VIAGRA) 100 MG tablet TAKE 1 TABLET BY MOUTH ONCE DAILY AS NEEDED FOR ERECTILE DYSFUNCTION Patient not taking: No sig reported 06/05/18 08/08/19  Minus Breeding, MD    Allergies    Bee venom  Review of Systems   Review of Systems  All other systems reviewed and are negative.   Physical Exam Updated Vital Signs BP (!) 159/105 (BP Location: Left Arm)   Pulse 68   Temp 97.7 F (36.5 C) (Oral)   Resp 15   Ht 1.829 m (6')   Wt 96 kg   SpO2 100%   BMI 28.70 kg/m   Physical Exam Vitals and nursing note reviewed.  Constitutional:      General: He is not in acute distress.    Appearance: He is well-developed and well-nourished.  HENT:  Head: Normocephalic and atraumatic.     Right Ear: External ear normal.     Left Ear: External ear normal.  Eyes:     General: No scleral icterus.       Right eye: No discharge.        Left eye: No discharge.     Conjunctiva/sclera: Conjunctivae normal.  Neck:     Trachea: No tracheal deviation.  Cardiovascular:     Rate and Rhythm: Normal rate and regular rhythm.     Pulses: Intact distal pulses.  Pulmonary:     Effort: Pulmonary effort is normal. No respiratory distress.     Breath sounds: Normal breath sounds. No stridor. No wheezing or rales.  Abdominal:     General: Bowel sounds are normal. There is no distension.     Palpations: Abdomen is soft.     Tenderness: There is no abdominal tenderness. There is no guarding or rebound.  Genitourinary:    Penis: Normal.      Testes:         Right: Tenderness and swelling present.        Left: Tenderness or swelling not present.     Epididymis:     Right: Tenderness present.  Musculoskeletal:        General: No tenderness or edema.     Cervical back: Neck supple.  Skin:    General: Skin is warm and dry.     Findings: No rash.  Neurological:     Mental Status: He is alert.     Cranial Nerves: No cranial nerve deficit (no facial droop, extraocular movements intact, no slurred speech).     Sensory: No sensory deficit.     Motor: No abnormal muscle tone or seizure activity.     Coordination: Coordination normal.     Deep Tendon Reflexes: Strength normal.  Psychiatric:        Mood and Affect: Mood and affect normal.     ED Results / Procedures / Treatments   Labs (all labs ordered are listed, but only abnormal results are displayed) Labs Reviewed  CBC - Abnormal; Notable for the following components:      Result Value   WBC 11.9 (*)    All other components within normal limits  BASIC METABOLIC PANEL - Abnormal; Notable for the following components:   Glucose, Bld 155 (*)    All other components within normal limits  URINE CULTURE  URINALYSIS, ROUTINE W REFLEX MICROSCOPIC    EKG None  Radiology US SCROTUM W/DOPPLER  Result Date: 02/20/2020 CLINICAL DATA:  Right testicular pain/swelling EXAM: SCROTAL ULTRASOUND DOPPLER ULTRASOUND OF THE TESTICLES TECHNIQUE: Complete ultrasound examination of the testicles, epididymis, and other scrotal structures was performed. Color and spectral Doppler ultrasound were also utilized to evaluate blood flow to the testicles. COMPARISON:  10/30/2017 FINDINGS: Right testicle Measurements: 3.9 x 2.4 x 2.4 cm. No mass or microlithiasis visualized. Left testicle Measurements: 3.6 x 1.7 x 2.1 cm. Heterogeneous parenchymal appearance, unchanged. No mass or microlithiasis visualized. Right epididymis:  Normal in size and appearance. Left epididymis:  Normal in size and appearance. Hydrocele:  Moderate right hydrocele, similar versus mildly decreased from the prior. Varicocele:  None visualized. Pulsed Doppler interrogation of both testes demonstrates normal low resistance arterial and venous waveforms bilaterally. IMPRESSION: Moderate right hydrocele, similar versus mildly decreased from the prior. Heterogeneous parenchymal appearance of the left testis, possibly related to prior infection/trauma, chronic. No evidence of testicular torsion. Electronically Signed   By: Bertis Ruddy  [place] Pink M.D.   On: 02/20/2020 09:39    Procedures Procedures   Medications Ordered in ED Medications - No data to display  ED Course  I have reviewed the triage vital signs and the nursing notes.  Pertinent labs & imaging results that were available during my care of the patient were reviewed by me and considered in my medical decision making (see chart for details).  Clinical Course as of 02/20/20 1106  Fri Feb 20, 2020  8250 Tested positive for covid 8 days ago. [JK]    Clinical Course User Index [JK] Dorie Rank, MD   MDM Rules/Calculators/A&P                         Testicle swelling and tenderness.  No hernia on exam.  ?  Epididymitis, orchitis.  Torsion unlikely.  Will check labs, ultrasound  Ultrasound shows evidence of hydrocele.  Suspect patient does have a component of epididymitis with his acute tenderness.  Patient low risk for STI.  Will treat with Cipro.  Outpatient follow-up with PCP or neurology Final Clinical Impression(s) / ED Diagnoses Final diagnoses:  Hydrocele, unspecified hydrocele type    Rx / DC Orders ED Discharge Orders         Ordered    ciprofloxacin (CIPRO) 500 MG tablet  2 times daily        02/20/20 1106           Dorie Rank, MD 02/20/20 1111

## 2020-02-20 NOTE — ED Triage Notes (Signed)
Patient reports R groin and scrotal swelling since this morning, tried to urinate and had some pain this morning. Denies N/V/D. Denies any pain with urination, lower back pain or discharge. Was COVID + 2 weeks ago.

## 2020-02-21 LAB — URINE CULTURE

## 2020-02-23 ENCOUNTER — Ambulatory Visit (HOSPITAL_COMMUNITY): Payer: Medicaid Other

## 2020-02-27 ENCOUNTER — Inpatient Hospital Stay (HOSPITAL_COMMUNITY)
Admission: EM | Admit: 2020-02-27 | Discharge: 2020-02-28 | DRG: 065 | Disposition: A | Discharge status: Home / Self Care | Payer: Medicaid Other | Attending: Internal Medicine | Admitting: Internal Medicine

## 2020-02-27 ENCOUNTER — Inpatient Hospital Stay (HOSPITAL_COMMUNITY): Payer: Medicaid Other

## 2020-02-27 ENCOUNTER — Emergency Department (HOSPITAL_COMMUNITY): Payer: Medicaid Other

## 2020-02-27 ENCOUNTER — Encounter (HOSPITAL_COMMUNITY): Payer: Self-pay

## 2020-02-27 ENCOUNTER — Other Ambulatory Visit: Payer: Self-pay

## 2020-02-27 DIAGNOSIS — R471 Dysarthria and anarthria: Secondary | ICD-10-CM | POA: Diagnosis present

## 2020-02-27 DIAGNOSIS — I639 Cerebral infarction, unspecified: Secondary | ICD-10-CM | POA: Diagnosis present

## 2020-02-27 DIAGNOSIS — I429 Cardiomyopathy, unspecified: Secondary | ICD-10-CM | POA: Diagnosis present

## 2020-02-27 DIAGNOSIS — Z91030 Bee allergy status: Secondary | ICD-10-CM

## 2020-02-27 DIAGNOSIS — I517 Cardiomegaly: Secondary | ICD-10-CM

## 2020-02-27 DIAGNOSIS — I6381 Other cerebral infarction due to occlusion or stenosis of small artery: Secondary | ICD-10-CM | POA: Diagnosis present

## 2020-02-27 DIAGNOSIS — I63312 Cerebral infarction due to thrombosis of left middle cerebral artery: Secondary | ICD-10-CM | POA: Diagnosis not present

## 2020-02-27 DIAGNOSIS — Z79899 Other long term (current) drug therapy: Secondary | ICD-10-CM | POA: Diagnosis not present

## 2020-02-27 DIAGNOSIS — I10 Essential (primary) hypertension: Secondary | ICD-10-CM

## 2020-02-27 DIAGNOSIS — U071 COVID-19: Secondary | ICD-10-CM

## 2020-02-27 DIAGNOSIS — R4701 Aphasia: Secondary | ICD-10-CM | POA: Diagnosis present

## 2020-02-27 DIAGNOSIS — R29702 NIHSS score 2: Secondary | ICD-10-CM | POA: Diagnosis present

## 2020-02-27 DIAGNOSIS — Z823 Family history of stroke: Secondary | ICD-10-CM

## 2020-02-27 DIAGNOSIS — R29810 Facial weakness: Secondary | ICD-10-CM | POA: Diagnosis present

## 2020-02-27 DIAGNOSIS — Z8249 Family history of ischemic heart disease and other diseases of the circulatory system: Secondary | ICD-10-CM | POA: Diagnosis not present

## 2020-02-27 DIAGNOSIS — E785 Hyperlipidemia, unspecified: Secondary | ICD-10-CM | POA: Diagnosis present

## 2020-02-27 DIAGNOSIS — D649 Anemia, unspecified: Secondary | ICD-10-CM | POA: Diagnosis present

## 2020-02-27 DIAGNOSIS — I6389 Other cerebral infarction: Secondary | ICD-10-CM | POA: Diagnosis not present

## 2020-02-27 DIAGNOSIS — Z8616 Personal history of COVID-19: Secondary | ICD-10-CM | POA: Diagnosis not present

## 2020-02-27 LAB — DIFFERENTIAL
Abs Immature Granulocytes: 0.02 10*3/uL (ref 0.00–0.07)
Basophils Absolute: 0 10*3/uL (ref 0.0–0.1)
Basophils Relative: 0 %
Eosinophils Absolute: 0.2 10*3/uL (ref 0.0–0.5)
Eosinophils Relative: 3 %
Immature Granulocytes: 0 %
Lymphocytes Relative: 33 %
Lymphs Abs: 2.3 10*3/uL (ref 0.7–4.0)
Monocytes Absolute: 0.4 10*3/uL (ref 0.1–1.0)
Monocytes Relative: 6 %
Neutro Abs: 4.1 10*3/uL (ref 1.7–7.7)
Neutrophils Relative %: 58 %

## 2020-02-27 LAB — URINALYSIS, ROUTINE W REFLEX MICROSCOPIC
Bilirubin Urine: NEGATIVE
Glucose, UA: NEGATIVE mg/dL
Hgb urine dipstick: NEGATIVE
Ketones, ur: NEGATIVE mg/dL
Leukocytes,Ua: NEGATIVE
Nitrite: NEGATIVE
Protein, ur: NEGATIVE mg/dL
Specific Gravity, Urine: 1.013 (ref 1.005–1.030)
pH: 5 (ref 5.0–8.0)

## 2020-02-27 LAB — COMPREHENSIVE METABOLIC PANEL
ALT: 34 U/L (ref 0–44)
AST: 19 U/L (ref 15–41)
Albumin: 3.9 g/dL (ref 3.5–5.0)
Alkaline Phosphatase: 76 U/L (ref 38–126)
Anion gap: 8 (ref 5–15)
BUN: 15 mg/dL (ref 8–23)
CO2: 26 mmol/L (ref 22–32)
Calcium: 9.5 mg/dL (ref 8.9–10.3)
Chloride: 110 mmol/L (ref 98–111)
Creatinine, Ser: 1.18 mg/dL (ref 0.61–1.24)
GFR, Estimated: >60 mL/min (ref >60)
Glucose, Bld: 100 mg/dL — ABNORMAL HIGH (ref 70–99)
Potassium: 3.7 mmol/L (ref 3.5–5.1)
Sodium: 144 mmol/L (ref 135–145)
Total Bilirubin: 0.6 mg/dL (ref 0.3–1.2)
Total Protein: 7.4 g/dL (ref 6.5–8.1)

## 2020-02-27 LAB — PROTIME-INR
INR: 1 (ref 0.8–1.2)
Prothrombin Time: 13 seconds (ref 11.4–15.2)

## 2020-02-27 LAB — I-STAT CHEM 8, ED
BUN: 14 mg/dL (ref 8–23)
Calcium, Ion: 1.31 mmol/L (ref 1.15–1.40)
Chloride: 110 mmol/L (ref 98–111)
Creatinine, Ser: 1.2 mg/dL (ref 0.61–1.24)
Glucose, Bld: 94 mg/dL (ref 70–99)
HCT: 36 % — ABNORMAL LOW (ref 39.0–52.0)
Hemoglobin: 12.2 g/dL — ABNORMAL LOW (ref 13.0–17.0)
Potassium: 3.7 mmol/L (ref 3.5–5.1)
Sodium: 144 mmol/L (ref 135–145)
TCO2: 25 mmol/L (ref 22–32)

## 2020-02-27 LAB — CBC
HCT: 36.9 % — ABNORMAL LOW (ref 39.0–52.0)
Hemoglobin: 12 g/dL — ABNORMAL LOW (ref 13.0–17.0)
MCH: 29.3 pg (ref 26.0–34.0)
MCHC: 32.5 g/dL (ref 30.0–36.0)
MCV: 90.2 fL (ref 80.0–100.0)
Platelets: 293 10*3/uL (ref 150–400)
RBC: 4.09 MIL/uL — ABNORMAL LOW (ref 4.22–5.81)
RDW: 13.8 % (ref 11.5–15.5)
WBC: 7 10*3/uL (ref 4.0–10.5)
nRBC: 0 % (ref 0.0–0.2)

## 2020-02-27 LAB — RAPID URINE DRUG SCREEN, HOSP PERFORMED
Amphetamines: NOT DETECTED
Barbiturates: NOT DETECTED
Benzodiazepines: NOT DETECTED
Cocaine: NOT DETECTED
Opiates: NOT DETECTED
Tetrahydrocannabinol: NOT DETECTED

## 2020-02-27 LAB — APTT: aPTT: 30 seconds (ref 24–36)

## 2020-02-27 LAB — ETHANOL: Alcohol, Ethyl (B): <10 mg/dL (ref <10)

## 2020-02-27 MED ORDER — SODIUM CHLORIDE 0.9 % IV SOLN: Freq: Once | INTRAVENOUS | Status: AC

## 2020-02-27 MED ORDER — ASPIRIN 325 MG PO TABS
325.0000 mg | ORAL_TABLET | Freq: Every day | ORAL | Status: DC
  Administered 2020-02-28: 325 mg via ORAL
  Filled 2020-02-27: qty 1

## 2020-02-27 MED ORDER — IOHEXOL 350 MG/ML SOLN
100.0000 mL | Freq: Once | INTRAVENOUS | Status: AC | PRN
  Administered 2020-02-27: 100 mL via INTRAVENOUS

## 2020-02-27 MED ORDER — ACETAMINOPHEN 325 MG PO TABS: 650.0000 mg | ORAL_TABLET | ORAL | Status: DC | PRN

## 2020-02-27 MED ORDER — ATORVASTATIN CALCIUM 80 MG PO TABS: 80.0000 mg | ORAL_TABLET | Freq: Every day | ORAL | Status: DC

## 2020-02-27 MED ORDER — ACETAMINOPHEN 160 MG/5ML PO SOLN: 650.0000 mg | ORAL | Status: DC | PRN

## 2020-02-27 MED ORDER — STROKE: EARLY STAGES OF RECOVERY BOOK
Freq: Once | Status: AC
  Filled 2020-02-27: qty 1

## 2020-02-27 MED ORDER — ACETAMINOPHEN 650 MG RE SUPP: 650.0000 mg | RECTAL | Status: DC | PRN

## 2020-02-27 MED ORDER — ASPIRIN 300 MG RE SUPP
300.0000 mg | Freq: Every day | RECTAL | Status: DC
  Administered 2020-02-28: 300 mg via RECTAL
  Filled 2020-02-27 (×2): qty 1

## 2020-02-27 MED ORDER — SENNOSIDES-DOCUSATE SODIUM 8.6-50 MG PO TABS: 1.0000 | ORAL_TABLET | Freq: Every evening | ORAL | Status: DC | PRN

## 2020-02-27 MED ORDER — LORAZEPAM 2 MG/ML IJ SOLN
1.0000 mg | Freq: Four times a day (QID) | INTRAMUSCULAR | Status: DC | PRN
  Administered 2020-02-27: 1 mg via INTRAVENOUS
  Filled 2020-02-27: qty 1

## 2020-02-27 NOTE — Progress Notes (Signed)
Called [facility] ED to obtain report on patient x 1, RN busy and will call me back.

## 2020-02-27 NOTE — ED Notes (Signed)
Pt to MRI

## 2020-02-27 NOTE — ED Notes (Signed)
Called [facility] for transport

## 2020-02-27 NOTE — ED Notes (Signed)
Clarified with Ralene Bathe, MD regarding HTN. Based on pt's presenting complaints, permissive HTN is acceptable. Melissa, RN at [facility] notified of BP change

## 2020-02-27 NOTE — ED Triage Notes (Signed)
Pt presents with c/o  aphasia that started 2 days ago. Pt also noted to have a right side facial droop.

## 2020-02-27 NOTE — ED Notes (Signed)
Attempted to contact Marylyn Ishihara, DO via secure chat but [facility] showed him as unavailable. [facility] bedside and needed decision regarding BP reading to be made expeditiously

## 2020-02-27 NOTE — ED Notes (Signed)
Attempted to call report to 3W at [facility]. RN receiving pt is busy and will call me back

## 2020-02-27 NOTE — H&P (Signed)
History and Physical    DESMAN POLAK VOZ:[id number] DOB: Mar 27, 1955 DOA: 02/27/2020  PCP: Patient, [name]  Patient coming from: Home  Chief Complaint: slurred speech  HPI: YIANNI SKILLING is a 65 y.o. male with medical history significant of HTN. Presenting with slurred speech. He states that his symptoms began suddenly 2 days ago. He felt a drawing of his face and his speech being "off". He didn't have any other symptoms. He didn't initially think much of it. However, as his symptoms worsened into this morning, he became concerned. So, he came to the ED. He denies any other aggravating or alleviating factors.     ED Course: New infarct was seen on MRI. EDP spoke with neurology. They recommended admission to [facility] for stroke w/u. [facility] was called for admission.   Review of Systems:  Denies CP, palpitations, N/V/D/F, dyspnea, syncopal episodes, falls. Review of systems is otherwise negative for all not mentioned in HPI.   PMHx Past Medical History:  Diagnosis Date  . Hypertension     PSHx Past Surgical History:  Procedure Laterality Date  . APPENDECTOMY    . EYE SURGERY    . HAND SURGERY    . KNEE SURGERY     x3, x1 R  . LIPOMA EXCISION Left 10/13/2019   Procedure: SHOULDER EXCISION LIPOMA AND ROTATOR CUFF REPAIR;  Surgeon: Tania Ade, MD;  Location: [place];  Service: Orthopedics;  Laterality: Left;  . TONSILLECTOMY      SocHx  reports that he has never smoked. He has never used smokeless tobacco. He reports current alcohol use. He reports that he does not use drugs.  Allergies  Allergen Reactions  . Bee Venom Anaphylaxis    FamHx Family History  Problem Relation Age of Onset  . Heart attack Mother 34  . COPD Mother   . Emphysema Mother   . Hypertension Sister     Prior to Admission medications   Medication Sig Start Date End Date Taking? Authorizing Provider  amLODipine (NORVASC) 10 MG tablet Take 1 tablet (10 mg total) by mouth daily.  Patient needs OV for future refills 02/02/20  Yes Minus Breeding, MD  ramipril (ALTACE) 10 MG capsule Take 1 capsule (10 mg total) by mouth daily. Patient needs OV for future refills 02/13/20 05/13/20 Yes Minus Breeding, MD  ciprofloxacin (CIPRO) 500 MG tablet Take 1 tablet (500 mg total) by mouth 2 (two) times daily. 02/20/20   Dorie Rank, MD  sildenafil (VIAGRA) 100 MG tablet TAKE 1 TABLET BY MOUTH ONCE DAILY AS NEEDED FOR ERECTILE DYSFUNCTION Patient not taking: No sig reported 06/05/18 08/08/19  Minus Breeding, MD    Physical Exam: Vitals:   02/27/20 1000 02/27/20 1030 02/27/20 1045 02/27/20 1206  BP: (!) 183/106 (!) 176/106 (!) 166/105 (!) 139/95  Pulse: 61 66 66 88  Resp: 19 11 15  (!) 23  Temp:      TempSrc:      SpO2: 100% 98% 100% 98%    General: 65 y.o. male resting in bed in NAD Eyes: PERRL, normal sclera ENMT: Nares patent w/o discharge, orophaynx clear, dentition normal, ears w/o discharge/lesions/ulcers Neck: Supple, trachea midline Cardiovascular: RRR, +S1, S2, no m/g/r, equal pulses throughout Respiratory: CTABL, no w/r/r, normal WOB GI: BS+, NDNT, no masses noted, no organomegaly noted MSK: No e/c/c Skin: No rashes, bruises, ulcerations noted Neuro: A&O x 3, left facial droop w/ slurred, other CN GNB, MSK strg 5/5 BLE/BUE  Psyc: Appropriate interaction and affect, calm/cooperative  Labs on Admission: I have personally reviewed following labs and imaging studies  CBC: Recent Labs  Lab 02/27/20 0931 02/27/20 0946  WBC 7.0  --   NEUTROABS 4.1  --   HGB 12.0* 12.2*  HCT 36.9* 36.0*  MCV 90.2  --   PLT 293  --    Basic Metabolic Panel: Recent Labs  Lab 02/27/20 0931 02/27/20 0946  NA 144 144  K 3.7 3.7  CL 110 110  CO2 26  --   GLUCOSE 100* 94  BUN 15 14  CREATININE 1.18 1.20  CALCIUM 9.5  --    GFR: Estimated Creatinine Clearance: 76.7 mL/min (by C-G formula based on SCr of 1.2 mg/dL). Liver Function Tests: Recent Labs  Lab 02/27/20 0931  AST  19  ALT 34  ALKPHOS 76  BILITOT 0.6  PROT 7.4  ALBUMIN 3.9   No results for input(s): LIPASE, AMYLASE in the last 168 hours. No results for input(s): AMMONIA in the last 168 hours. Coagulation Profile: Recent Labs  Lab 02/27/20 0931  INR 1.0   Cardiac Enzymes: No results for input(s): CKTOTAL, CKMB, CKMBINDEX, TROPONINI in the last 168 hours. BNP (last 3 results) No results for input(s): PROBNP in the last 8760 hours. HbA1C: No results for input(s): HGBA1C in the last 72 hours. CBG: No results for input(s): GLUCAP in the last 168 hours. Lipid Profile: No results for input(s): CHOL, HDL, LDLCALC, TRIG, CHOLHDL, LDLDIRECT in the last 72 hours. Thyroid Function Tests: No results for input(s): TSH, T4TOTAL, FREET4, T3FREE, THYROIDAB in the last 72 hours. Anemia Panel: No results for input(s): VITAMINB12, FOLATE, FERRITIN, TIBC, IRON, RETICCTPCT in the last 72 hours. Urine analysis:    Component Value Date/Time   COLORURINE YELLOW 02/27/2020 [place] 02/27/2020 1042   LABSPEC 1.013 02/27/2020 1042   PHURINE 5.0 02/27/2020 1042   GLUCOSEU NEGATIVE 02/27/2020 1042   HGBUR NEGATIVE 02/27/2020 1042   BILIRUBINUR NEGATIVE 02/27/2020 1042   KETONESUR NEGATIVE 02/27/2020 1042   PROTEINUR NEGATIVE 02/27/2020 1042   UROBILINOGEN 0.2 08/08/2019 1050   NITRITE NEGATIVE 02/27/2020 1042   LEUKOCYTESUR NEGATIVE 02/27/2020 1042    Radiological Exams on Admission: CT Angio Head W or Wo Contrast  Result Date: 02/27/2020 CLINICAL DATA:  Aphasia. Right-sided facial droop. Acute left internal capsule infarct on MRI. EXAM: CT ANGIOGRAPHY HEAD AND NECK TECHNIQUE: Multidetector CT imaging of the head and neck was performed using the standard protocol during bolus administration of intravenous contrast. Multiplanar CT image reconstructions and MIPs were obtained to evaluate the vascular anatomy. Carotid stenosis measurements (when applicable) are obtained utilizing NASCET  criteria, using the distal internal carotid diameter as the denominator. CONTRAST:  116mL OMNIPAQUE IOHEXOL 350 MG/ML SOLN COMPARISON:  Head MRI 02/27/2020 FINDINGS: CT HEAD FINDINGS Brain: There is a small focus of vague hypoattenuation in the posterior limb of the left internal capsule corresponding to the acute infarct on MRI. Chronic lacunar infarcts are again noted in the left basal ganglia and right corona radiata. No intracranial hemorrhage, mass, midline shift, or extra-axial fluid collection, or hydrocephalus is evident. Vascular: No hyperdense vessel. Skull: No fracture or suspicious osseous lesion. Sinuses: Visualized paranasal sinuses and mastoid air cells are clear. Orbits: Unremarkable. Review of the MIP images confirms the above findings CTA NECK FINDINGS Aortic arch: Normal variant aortic arch branching pattern with common origin of the brachiocephalic and left common carotid arteries. Widely patent arch vessel origins. Right carotid system: Patent without evidence of stenosis, dissection, or significant atherosclerosis.  Left carotid system: Patent without evidence of stenosis, dissection, or significant atherosclerosis. Vertebral arteries: Patent without evidence of stenosis, dissection, or significant atherosclerosis. Codominant. Skeleton: Advanced disc degeneration throughout the mid and lower cervical spine. Other neck: Diffusely enlarged thyroid with a questionable 1.5 cm slightly hypoattenuating nodule in the left lobe. Upper chest: Small pulmonary nodules posteriorly in the left upper lobe measuring up to 4 mm, largely new from a 02/20/2018 chest CTA. Review of the MIP images confirms the above findings CTA HEAD FINDINGS Anterior circulation: The internal carotid arteries are widely patent from skull base to carotid termini. ACAs and MCAs are patent without evidence of a proximal branch occlusion or significant proximal stenosis. The right A1 segment is hypoplastic. No aneurysm is identified.  Posterior circulation: The intracranial vertebral arteries are widely patent to the basilar. Patent left PICA, right AICA, and bilateral SCA origins are identified. The basilar artery is widely patent. Posterior communicating arteries are not clearly identified and may be diminutive or absent. Both PCAs are patent without evidence of a significant proximal stenosis. No aneurysm is identified. Venous sinuses: Patent. Anatomic variants: Hypoplastic right A1. Review of the MIP images confirms the above findings IMPRESSION: 1. No large vessel occlusion, significant stenosis, or aneurysm in the head and neck. 2. Small left upper lobe pulmonary nodules measuring up to 4 mm. No follow-up needed if patient is low-risk (and has no known or suspected primary neoplasm). Non-contrast chest CT can be considered in 12 months if patient is high-risk. This recommendation follows the consensus statement: Guidelines for Management of Incidental Pulmonary Nodules Detected on CT Images: From the Fleischner Society 2017; Radiology 2017; 284:228-243. 3. Diffusely enlarged thyroid with a possible 1.5 cm nodule in the left lobe. Recommend thyroid US (ref: J Am Coll Radiol. 2015 Feb;12(2): 143-50). Electronically Signed   By: Logan Bores M.D.   On: 02/27/2020 12:34   CT Angio Neck W and/or Wo Contrast  Result Date: 02/27/2020 CLINICAL DATA:  Aphasia. Right-sided facial droop. Acute left internal capsule infarct on MRI. EXAM: CT ANGIOGRAPHY HEAD AND NECK TECHNIQUE: Multidetector CT imaging of the head and neck was performed using the standard protocol during bolus administration of intravenous contrast. Multiplanar CT image reconstructions and MIPs were obtained to evaluate the vascular anatomy. Carotid stenosis measurements (when applicable) are obtained utilizing NASCET criteria, using the distal internal carotid diameter as the denominator. CONTRAST:  147mL OMNIPAQUE IOHEXOL 350 MG/ML SOLN COMPARISON:  Head MRI 02/27/2020 FINDINGS: CT  HEAD FINDINGS Brain: There is a small focus of vague hypoattenuation in the posterior limb of the left internal capsule corresponding to the acute infarct on MRI. Chronic lacunar infarcts are again noted in the left basal ganglia and right corona radiata. No intracranial hemorrhage, mass, midline shift, or extra-axial fluid collection, or hydrocephalus is evident. Vascular: No hyperdense vessel. Skull: No fracture or suspicious osseous lesion. Sinuses: Visualized paranasal sinuses and mastoid air cells are clear. Orbits: Unremarkable. Review of the MIP images confirms the above findings CTA NECK FINDINGS Aortic arch: Normal variant aortic arch branching pattern with common origin of the brachiocephalic and left common carotid arteries. Widely patent arch vessel origins. Right carotid system: Patent without evidence of stenosis, dissection, or significant atherosclerosis. Left carotid system: Patent without evidence of stenosis, dissection, or significant atherosclerosis. Vertebral arteries: Patent without evidence of stenosis, dissection, or significant atherosclerosis. Codominant. Skeleton: Advanced disc degeneration throughout the mid and lower cervical spine. Other neck: Diffusely enlarged thyroid with a questionable 1.5 cm slightly hypoattenuating nodule in  the left lobe. Upper chest: Small pulmonary nodules posteriorly in the left upper lobe measuring up to 4 mm, largely new from a 02/20/2018 chest CTA. Review of the MIP images confirms the above findings CTA HEAD FINDINGS Anterior circulation: The internal carotid arteries are widely patent from skull base to carotid termini. ACAs and MCAs are patent without evidence of a proximal branch occlusion or significant proximal stenosis. The right A1 segment is hypoplastic. No aneurysm is identified. Posterior circulation: The intracranial vertebral arteries are widely patent to the basilar. Patent left PICA, right AICA, and bilateral SCA origins are identified. The  basilar artery is widely patent. Posterior communicating arteries are not clearly identified and may be diminutive or absent. Both PCAs are patent without evidence of a significant proximal stenosis. No aneurysm is identified. Venous sinuses: Patent. Anatomic variants: Hypoplastic right A1. Review of the MIP images confirms the above findings IMPRESSION: 1. No large vessel occlusion, significant stenosis, or aneurysm in the head and neck. 2. Small left upper lobe pulmonary nodules measuring up to 4 mm. No follow-up needed if patient is low-risk (and has no known or suspected primary neoplasm). Non-contrast chest CT can be considered in 12 months if patient is high-risk. This recommendation follows the consensus statement: Guidelines for Management of Incidental Pulmonary Nodules Detected on CT Images: From the Fleischner Society 2017; Radiology 2017; 284:228-243. 3. Diffusely enlarged thyroid with a possible 1.5 cm nodule in the left lobe. Recommend thyroid US (ref: J Am Coll Radiol. 2015 Feb;12(2): 143-50). Electronically Signed   By: Logan Bores M.D.   On: 02/27/2020 12:34   MR BRAIN WO CONTRAST  Result Date: 02/27/2020 CLINICAL DATA:  Neuro deficit, acute, stroke suspected. Additional history provided: Aphasia and right-sided facial droop. EXAM: MRI HEAD WITHOUT CONTRAST TECHNIQUE: Multiplanar, multiecho pulse sequences of the brain and surrounding structures were obtained without intravenous contrast. COMPARISON:  Prior head CT examinations 02/20/2019 and earlier. FINDINGS: Brain: Intermittently motion degraded exam. Most notably, there is moderate motion degradation of the coronal T2 weighted sequence. Cerebral volume is normal for age. 9 mm focus of restricted diffusion consistent with acute infarction centered within the posterior limb of left internal capsule. This acute infarct may also involve the adjacent left thalamus and lentiform nucleus. There are two small vessel infarcts within the left corona  radiata/basal ganglia, which appear new as compared to the prior head CT of 02/20/2019. Redemonstrated chronic lacunar infarct within the right corona radiata. Mild multifocal T2/FLAIR hyperintensity within the cerebral white matter is nonspecific, but compatible with chronic small vessel ischemic disease. Chronic microhemorrhage within the right cerebellar hemisphere. Additional subtle SWI signal loss within the left basal ganglia which may reflect mineralization or chronic hemosiderin deposition. No evidence of intracranial mass. No extra-axial fluid collection. No midline shift. Vascular: Expected proximal arterial flow voids. Skull and upper cervical spine: No focal marrow lesion. Sinuses/Orbits: Visualized orbits show no acute finding. No significant paranasal sinus disease. IMPRESSION: Intermittently motion degraded exam. 9 mm acute infarct centered within the posterior limb of left internal capsule. This infarct may also involve the adjacent left thalamus and lentiform nucleus. Two chronic small-vessel infarcts within the left corona radiata/basal ganglia, new as compared to the head CT of 02/20/2019. Redemonstrated chronic lacunar infarct within the right corona radiata. Background mild cerebral white matter chronic small vessel ischemic disease. Electronically Signed   By: Kellie Simmering DO   On: 02/27/2020 11:35    EKG: Independently reviewed. NSR  Assessment/Plan CVA     -  admit to inpt, tele @ [facility]     - neurology to see (Dr. Roque Cash aware)     - PT/OT/SLP     - NPO for now; permissive HTN for now     - MRI results as above     - CT H&N results as above     - check echo     - check A1c, FLP     - ASA, statin  HTN     - hold home regimen; all for permissive HTN to 220/110  Recent COVID infection     - 1st positive 02/12/20     - no respiratory symptoms; he has not been hospitalized for COVID; isolation period of 10-days has been completed  Normocytic anemia     - no evidence of  bleed; follow  DVT prophylaxis: SCDs  Code Status: DNI, confirmed with patient.  Family Communication: None at bedside.  Consults called: EDP spoke with neurology   Status is: Inpatient  Remains inpatient appropriate because:Inpatient level of care appropriate due to severity of illness   Dispo: The patient is from: Home              Anticipated d/c is to: TBD              Anticipated d/c date is: 2 days              Patient currently is not medically stable to d/c.   Difficult to place patient No  Time spent coordinating admission: 75 minutes spent in the coordination of care today.   Jonnie Finner DO [facility]  If 7PM-7AM, please contact night-coverage [email or web address]  02/27/2020, 12:47 PM

## 2020-02-27 NOTE — ED Provider Notes (Signed)
[facility] DEPT Provider Note   CSN: [id number] Arrival date & time: 02/27/20  [id number]     History Chief Complaint  Patient presents with  . Facial Droop  . Aphasia    Curtis Saunders is a 65 y.o. male.  He has a history of hypertension and ascending aortic aneurysm.  He woke up 2 days ago with difficulty with his speech.  Also noted some facial droop.  No visual symptoms no numbness no weakness no balance problems.  No prior history of same.  No headache.  Non-smoker and denies any drugs.  He was Covid + January 20 of this year.  The history is provided by the patient.  Cerebrovascular Accident This is a new problem. The current episode started 2 days ago. The problem occurs constantly. The problem has not changed since onset.Pertinent negatives include no chest pain, no abdominal pain, no headaches and no shortness of breath. Nothing aggravates the symptoms. Nothing relieves the symptoms. He has tried nothing for the symptoms. The treatment provided no relief.       Past Medical History:  Diagnosis Date  . Hypertension     Patient Active Problem List   Diagnosis Date Noted  . Ascending aortic aneurysm ([place]) 02/11/2020  . LVH (left ventricular hypertrophy) 03/06/2018  . Essential hypertension 03/06/2018  . HTN (hypertension), malignant 02/20/2018  . Chest pain 02/20/2018    Past Surgical History:  Procedure Laterality Date  . APPENDECTOMY    . EYE SURGERY    . HAND SURGERY    . KNEE SURGERY     x3, x1 R  . LIPOMA EXCISION Left 10/13/2019   Procedure: SHOULDER EXCISION LIPOMA AND ROTATOR CUFF REPAIR;  Surgeon: Tania Ade, MD;  Location: [facility];  Service: Orthopedics;  Laterality: Left;  . TONSILLECTOMY         Family History  Problem Relation Age of Onset  . Heart attack Mother 82  . COPD Mother   . Emphysema Mother   . Hypertension Sister     Social History   Tobacco Use  . Smoking status: Never Smoker   . Smokeless tobacco: Never Used  Vaping Use  . Vaping Use: Never used  Substance Use Topics  . Alcohol use: Yes    Comment: socially  . Drug use: No    Home Medications Prior to Admission medications   Medication Sig Start Date End Date Taking? Authorizing Provider  amLODipine (NORVASC) 10 MG tablet Take 1 tablet (10 mg total) by mouth daily. Patient needs OV for future refills 02/02/20   Minus Breeding, MD  ciprofloxacin (CIPRO) 500 MG tablet Take 1 tablet (500 mg total) by mouth 2 (two) times daily. 02/20/20   Dorie Rank, MD  ramipril (ALTACE) 10 MG capsule Take 1 capsule (10 mg total) by mouth daily. Patient needs OV for future refills 02/13/20 05/13/20  Minus Breeding, MD  sildenafil (VIAGRA) 100 MG tablet TAKE 1 TABLET BY MOUTH ONCE DAILY AS NEEDED FOR ERECTILE DYSFUNCTION Patient not taking: No sig reported 06/05/18 08/08/19  Minus Breeding, MD    Allergies    Bee venom  Review of Systems   Review of Systems  Constitutional: Negative for fever.  HENT: Negative for sore throat.   Eyes: Negative for visual disturbance.  Respiratory: Negative for shortness of breath.   Cardiovascular: Negative for chest pain.  Gastrointestinal: Negative for abdominal pain.  Genitourinary: Negative for dysuria.  Musculoskeletal: Negative for gait problem.  Skin: Negative for rash.  Neurological: Positive for speech difficulty and weakness. Negative for numbness and headaches.    Physical Exam Updated Vital Signs BP (!) 169/108 (BP Location: Left Arm)   Pulse 71   Temp 98.2 F (36.8 C) (Oral)   Resp 16   SpO2 100%   Physical Exam Vitals and nursing note reviewed.  Constitutional:      Appearance: Normal appearance. He is well-developed and well-nourished.  HENT:     Head: Normocephalic and atraumatic.  Eyes:     Conjunctiva/sclera: Conjunctivae normal.  Cardiovascular:     Rate and Rhythm: Normal rate and regular rhythm.     Heart sounds: No murmur heard.   Pulmonary:      Effort: Pulmonary effort is normal. No respiratory distress.     Breath sounds: Normal breath sounds.  Abdominal:     Palpations: Abdomen is soft.     Tenderness: There is no abdominal tenderness.  Musculoskeletal:        General: No edema.     Cervical back: Neck supple.  Skin:    General: Skin is warm and dry.     Capillary Refill: Capillary refill takes less than 2 seconds.  Neurological:     Mental Status: He is alert and oriented to person, place, and time.     Cranial Nerves: Cranial nerve deficit present.     Sensory: No sensory deficit.     Motor: No weakness.     Comments: He has intermittent garbled speech.  Extraocular movements intact.  No field cut.  Normal facial sensation.  Does have facial droop on right.  Upper and lower extremity strength preserved.  No pronator drift.  Normal gait.  Sensation upper and lower extremities intact to light touch.  Psychiatric:        Mood and Affect: Mood and affect normal.     ED Results / Procedures / Treatments   Labs (all labs ordered are listed, but only abnormal results are displayed) Labs Reviewed  CBC - Abnormal; Notable for the following components:      Result Value   RBC 4.09 (*)    Hemoglobin 12.0 (*)    HCT 36.9 (*)    All other components within normal limits  COMPREHENSIVE METABOLIC PANEL - Abnormal; Notable for the following components:   Glucose, Bld 100 (*)    All other components within normal limits  HEMOGLOBIN A1C - Abnormal; Notable for the following components:   Hgb A1c MFr Bld 6.2 (*)    All other components within normal limits  LIPID PANEL - Abnormal; Notable for the following components:   LDL Cholesterol 108 (*)    All other components within normal limits  I-STAT CHEM 8, ED - Abnormal; Notable for the following components:   Hemoglobin 12.2 (*)    HCT 36.0 (*)    All other components within normal limits  ETHANOL  PROTIME-INR  APTT  DIFFERENTIAL  RAPID URINE DRUG SCREEN, HOSP PERFORMED   URINALYSIS, ROUTINE W REFLEX MICROSCOPIC  HIV ANTIBODY (ROUTINE TESTING W REFLEX)    EKG EKG Interpretation  Date/Time:  Friday February 27 2020 09:32:14 EST Ventricular Rate:  60 PR Interval:    QRS Duration: 98 QT Interval:  400 QTC Calculation: 400 R Axis:   12 Text Interpretation: Sinus rhythm ST elevation, consider inferior injury No significant change since prior 1/22 Confirmed by Aletta Edouard [phone]) on 02/27/2020 9:34:06 AM   Radiology CT Angio Head W or Wo Contrast  Result Date: 02/27/2020 CLINICAL DATA:  Aphasia. Right-sided facial droop. Acute left internal capsule infarct on MRI. EXAM: CT ANGIOGRAPHY HEAD AND NECK TECHNIQUE: Multidetector CT imaging of the head and neck was performed using the standard protocol during bolus administration of intravenous contrast. Multiplanar CT image reconstructions and MIPs were obtained to evaluate the vascular anatomy. Carotid stenosis measurements (when applicable) are obtained utilizing NASCET criteria, using the distal internal carotid diameter as the denominator. CONTRAST:  156mL OMNIPAQUE IOHEXOL 350 MG/ML SOLN COMPARISON:  Head MRI 02/27/2020 FINDINGS: CT HEAD FINDINGS Brain: There is a small focus of vague hypoattenuation in the posterior limb of the left internal capsule corresponding to the acute infarct on MRI. Chronic lacunar infarcts are again noted in the left basal ganglia and right corona radiata. No intracranial hemorrhage, mass, midline shift, or extra-axial fluid collection, or hydrocephalus is evident. Vascular: No hyperdense vessel. Skull: No fracture or suspicious osseous lesion. Sinuses: Visualized paranasal sinuses and mastoid air cells are clear. Orbits: Unremarkable. Review of the MIP images confirms the above findings CTA NECK FINDINGS Aortic arch: Normal variant aortic arch branching pattern with common origin of the brachiocephalic and left common carotid arteries. Widely patent arch vessel origins. Right carotid  system: Patent without evidence of stenosis, dissection, or significant atherosclerosis. Left carotid system: Patent without evidence of stenosis, dissection, or significant atherosclerosis. Vertebral arteries: Patent without evidence of stenosis, dissection, or significant atherosclerosis. Codominant. Skeleton: Advanced disc degeneration throughout the mid and lower cervical spine. Other neck: Diffusely enlarged thyroid with a questionable 1.5 cm slightly hypoattenuating nodule in the left lobe. Upper chest: Small pulmonary nodules posteriorly in the left upper lobe measuring up to 4 mm, largely new from a 02/20/2018 chest CTA. Review of the MIP images confirms the above findings CTA HEAD FINDINGS Anterior circulation: The internal carotid arteries are widely patent from skull base to carotid termini. ACAs and MCAs are patent without evidence of a proximal branch occlusion or significant proximal stenosis. The right A1 segment is hypoplastic. No aneurysm is identified. Posterior circulation: The intracranial vertebral arteries are widely patent to the basilar. Patent left PICA, right AICA, and bilateral SCA origins are identified. The basilar artery is widely patent. Posterior communicating arteries are not clearly identified and may be diminutive or absent. Both PCAs are patent without evidence of a significant proximal stenosis. No aneurysm is identified. Venous sinuses: Patent. Anatomic variants: Hypoplastic right A1. Review of the MIP images confirms the above findings IMPRESSION: 1. No large vessel occlusion, significant stenosis, or aneurysm in the head and neck. 2. Small left upper lobe pulmonary nodules measuring up to 4 mm. No follow-up needed if patient is low-risk (and has no known or suspected primary neoplasm). Non-contrast chest CT can be considered in 12 months if patient is high-risk. This recommendation follows the consensus statement: Guidelines for Management of Incidental Pulmonary Nodules  Detected on CT Images: From the Fleischner Society 2017; Radiology 2017; 284:228-243. 3. Diffusely enlarged thyroid with a possible 1.5 cm nodule in the left lobe. Recommend thyroid US (ref: J Am Coll Radiol. 2015 Feb;12(2): 143-50). Electronically Signed   By: Logan Bores M.D.   On: 02/27/2020 12:34   CT Angio Neck W and/or Wo Contrast  Result Date: 02/27/2020 CLINICAL DATA:  Aphasia. Right-sided facial droop. Acute left internal capsule infarct on MRI. EXAM: CT ANGIOGRAPHY HEAD AND NECK TECHNIQUE: Multidetector CT imaging of the head and neck was performed using the standard protocol during bolus administration of intravenous contrast. Multiplanar CT image reconstructions and MIPs were obtained to evaluate the  vascular anatomy. Carotid stenosis measurements (when applicable) are obtained utilizing NASCET criteria, using the distal internal carotid diameter as the denominator. CONTRAST:  152mL OMNIPAQUE IOHEXOL 350 MG/ML SOLN COMPARISON:  Head MRI 02/27/2020 FINDINGS: CT HEAD FINDINGS Brain: There is a small focus of vague hypoattenuation in the posterior limb of the left internal capsule corresponding to the acute infarct on MRI. Chronic lacunar infarcts are again noted in the left basal ganglia and right corona radiata. No intracranial hemorrhage, mass, midline shift, or extra-axial fluid collection, or hydrocephalus is evident. Vascular: No hyperdense vessel. Skull: No fracture or suspicious osseous lesion. Sinuses: Visualized paranasal sinuses and mastoid air cells are clear. Orbits: Unremarkable. Review of the MIP images confirms the above findings CTA NECK FINDINGS Aortic arch: Normal variant aortic arch branching pattern with common origin of the brachiocephalic and left common carotid arteries. Widely patent arch vessel origins. Right carotid system: Patent without evidence of stenosis, dissection, or significant atherosclerosis. Left carotid system: Patent without evidence of stenosis, dissection, or  significant atherosclerosis. Vertebral arteries: Patent without evidence of stenosis, dissection, or significant atherosclerosis. Codominant. Skeleton: Advanced disc degeneration throughout the mid and lower cervical spine. Other neck: Diffusely enlarged thyroid with a questionable 1.5 cm slightly hypoattenuating nodule in the left lobe. Upper chest: Small pulmonary nodules posteriorly in the left upper lobe measuring up to 4 mm, largely new from a 02/20/2018 chest CTA. Review of the MIP images confirms the above findings CTA HEAD FINDINGS Anterior circulation: The internal carotid arteries are widely patent from skull base to carotid termini. ACAs and MCAs are patent without evidence of a proximal branch occlusion or significant proximal stenosis. The right A1 segment is hypoplastic. No aneurysm is identified. Posterior circulation: The intracranial vertebral arteries are widely patent to the basilar. Patent left PICA, right AICA, and bilateral SCA origins are identified. The basilar artery is widely patent. Posterior communicating arteries are not clearly identified and may be diminutive or absent. Both PCAs are patent without evidence of a significant proximal stenosis. No aneurysm is identified. Venous sinuses: Patent. Anatomic variants: Hypoplastic right A1. Review of the MIP images confirms the above findings IMPRESSION: 1. No large vessel occlusion, significant stenosis, or aneurysm in the head and neck. 2. Small left upper lobe pulmonary nodules measuring up to 4 mm. No follow-up needed if patient is low-risk (and has no known or suspected primary neoplasm). Non-contrast chest CT can be considered in 12 months if patient is high-risk. This recommendation follows the consensus statement: Guidelines for Management of Incidental Pulmonary Nodules Detected on CT Images: From the Fleischner Society 2017; Radiology 2017; 284:228-243. 3. Diffusely enlarged thyroid with a possible 1.5 cm nodule in the left lobe.  Recommend thyroid US (ref: J Am Coll Radiol. 2015 Feb;12(2): 143-50). Electronically Signed   By: Logan Bores M.D.   On: 02/27/2020 12:34   MR BRAIN WO CONTRAST  Result Date: 02/27/2020 CLINICAL DATA:  Neuro deficit, acute, stroke suspected. Additional history provided: Aphasia and right-sided facial droop. EXAM: MRI HEAD WITHOUT CONTRAST TECHNIQUE: Multiplanar, multiecho pulse sequences of the brain and surrounding structures were obtained without intravenous contrast. COMPARISON:  Prior head CT examinations 02/20/2019 and earlier. FINDINGS: Brain: Intermittently motion degraded exam. Most notably, there is moderate motion degradation of the coronal T2 weighted sequence. Cerebral volume is normal for age. 9 mm focus of restricted diffusion consistent with acute infarction centered within the posterior limb of left internal capsule. This acute infarct may also involve the adjacent left thalamus and lentiform nucleus. There  are two small vessel infarcts within the left corona radiata/basal ganglia, which appear new as compared to the prior head CT of 02/20/2019. Redemonstrated chronic lacunar infarct within the right corona radiata. Mild multifocal T2/FLAIR hyperintensity within the cerebral white matter is nonspecific, but compatible with chronic small vessel ischemic disease. Chronic microhemorrhage within the right cerebellar hemisphere. Additional subtle SWI signal loss within the left basal ganglia which may reflect mineralization or chronic hemosiderin deposition. No evidence of intracranial mass. No extra-axial fluid collection. No midline shift. Vascular: Expected proximal arterial flow voids. Skull and upper cervical spine: No focal marrow lesion. Sinuses/Orbits: Visualized orbits show no acute finding. No significant paranasal sinus disease. IMPRESSION: Intermittently motion degraded exam. 9 mm acute infarct centered within the posterior limb of left internal capsule. This infarct may also involve the  adjacent left thalamus and lentiform nucleus. Two chronic small-vessel infarcts within the left corona radiata/basal ganglia, new as compared to the head CT of 02/20/2019. Redemonstrated chronic lacunar infarct within the right corona radiata. Background mild cerebral white matter chronic small vessel ischemic disease. Electronically Signed   By: Kellie Simmering DO   On: 02/27/2020 11:35   DG CHEST PORT 1 VIEW  Result Date: 02/27/2020 CLINICAL DATA:  65 year old male with aphasia that started 2 days ago. RIGHT-sided facial droop also present by report. EXAM: PORTABLE CHEST 1 VIEW COMPARISON:  February 12, 2020. FINDINGS: Cardiomediastinal contours are stable.  Hilar structures are normal. Improved aeration at the LEFT base when compared to the prior study. Stable linear opacities in the RIGHT mid chest with linear opacity in the LEFT mid chest that is new compared to the prior exam. No sign of lobar consolidative process or pleural effusion. On limited assessment no acute skeletal process. IMPRESSION: 1. Improved aeration at the LEFT base when compared to prior study. 2. Is linear opacities on the RIGHT stable, potentially scarring. New opacity in the LEFT chest may represent atelectasis or developing infection. Electronically Signed   By: Zetta Bills M.D.   On: 02/27/2020 13:19    Procedures Procedures   Medications Ordered in ED Medications  LORazepam (ATIVAN) injection 1 mg (1 mg Intravenous Given 02/27/20 1037)  acetaminophen (TYLENOL) tablet 650 mg (has no administration in time range)    Or  acetaminophen (TYLENOL) 160 MG/5ML solution 650 mg (has no administration in time range)    Or  acetaminophen (TYLENOL) suppository 650 mg (has no administration in time range)  senna-docusate (Senokot-S) tablet 1 tablet (has no administration in time range)  aspirin suppository 300 mg ( Rectal See Alternative 02/28/20 1036)    Or  aspirin tablet 325 mg (325 mg Oral Given 02/28/20 1036)  atorvastatin  (LIPITOR) tablet 80 mg (80 mg Oral Not Given 02/28/20 0551)  iohexol (OMNIPAQUE) 350 MG/ML injection 100 mL (100 mLs Intravenous Contrast Given 02/27/20 1132)   stroke: mapping our early stages of recovery book ( Does not apply Given 02/27/20 2200)  0.9 %  sodium chloride infusion (0 mLs Intravenous Stopped 02/27/20 1917)    ED Course  I have reviewed the triage vital signs and the nursing notes.  Pertinent labs & imaging results that were available during my care of the patient were reviewed by me and considered in my medical decision making (see chart for details).  Clinical Course as of 02/28/20 1123  Fri Feb 27, 2020  1156 Blood pressure elevated here. Per Dr. Leonel Ramsay no intervention unless greater than 220/120. [MB]  1226 Discussed with [name] who will evaluate  the patient for admission. [MB]  1226 Reviewed results with patient and recommendations from neurology.  He is in agreement with plan for admission. [MB]    Clinical Course User Index [MB] Hayden Rasmussen, MD   MDM Rules/Calculators/A&P                         This patient complains of facial droop and garbled speech; this involves an extensive number of treatment Options and is a complaint that carries with it a high risk of complications and Morbidity. The differential includes stroke, bleed, hypoglycemia, metabolic derangement  I ordered, reviewed and interpreted labs, which included CBC with normal white count, hemoglobin slightly lower than baseline, chemistries normal, alcohol negative, coags normal I ordered medication IV lorazepam for sedation for MRI I ordered imaging studies which included MRI brain, CTA head and neck and I independently    visualized and interpreted imaging which showed acute infarct left internal capsule Previous records obtained and reviewed in epic no recent admissions I consulted Dr. Leonel Ramsay neurology and Dr. Marylyn Ishihara [name] and discussed lab and imaging  findings  Critical Interventions: None  After the interventions stated above, I reevaluated the patient and found patient still to be symptomatic although in no distress.  Reviewed work-up with him and he is comfortable plan for admission to the hospital for continued work-up and management.  Curtis Saunders was evaluated in Emergency Department on 02/27/2020 for the symptoms described in the history of present illness. He was evaluated in the context of the global COVID-19 pandemic, which necessitated consideration that the patient might be at risk for infection with the SARS-CoV-2 virus that causes COVID-19. Institutional protocols and algorithms that pertain to the evaluation of patients at risk for COVID-19 are in a state of rapid change based on information released by regulatory bodies including the CDC and federal and state organizations. These policies and algorithms were followed during the patient's care in the ED.  Final Clinical Impression(s) / ED Diagnoses Final diagnoses:  Acute CVA (cerebrovascular accident) [facility])  Essential hypertension    Rx / DC Orders ED Discharge Orders    None       Hayden Rasmussen, MD 02/28/20 1128

## 2020-02-27 NOTE — Consult Note (Signed)
NEUROLOGY CONSULTATION NOTE   Date of service: February 27, 2020 Patient Name: Curtis Saunders MRN:  [id number] DOB:  [birth year] Reason for consult: "left internal capsule stroke" _ _ _   _ __   _ __ _ _  __ __   _ __   __ _  History of Present Illness  Curtis Saunders is a 65 y.o. male with PMH significant for HTN p/w slurred speech and R facial droop that he woke up with on 02/25/20. Presented to the ED when his symptoms were persistent. 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 He was outside the window for any intervention at the time of presentation.  He denies any prior history of strokes. No arm or leg weakness or numbness. No abnormalities with his vision. Does not take aspirin at home. Endorses family history of stroke in his mother.  LKW: 02/24/20 NIHSS: 2 MRS: 0 Outside window for tPA or thrombectomy.   ROS   Constitutional Denies weight loss, fever and chills.   HEENT Denies changes in vision and hearing.   Respiratory Denies SOB and cough.   CV Denies palpitations and CP   GI Denies abdominal pain, nausea, vomiting and diarrhea.   GU Denies dysuria and urinary frequency.   MSK Denies myalgia and joint pain.   Skin Denies rash and pruritus.   Neurological Denies headache and syncope.   Psychiatric Denies recent changes in mood. Denies anxiety and depression.    Past History   Past Medical History:  Diagnosis Date  . Hypertension    Past Surgical History:  Procedure Laterality Date  . APPENDECTOMY    . EYE SURGERY    . HAND SURGERY    . KNEE SURGERY     x3, x1 R  . LIPOMA EXCISION Left 10/13/2019   Procedure: SHOULDER EXCISION LIPOMA AND ROTATOR CUFF REPAIR;  Surgeon: Tania Ade, MD;  Location: [facility];  Service: Orthopedics;  Laterality: Left;  . TONSILLECTOMY     Family History  Problem Relation Age of Onset  . Heart attack Mother 87  . COPD Mother   . Emphysema Mother   . Hypertension Sister    Social History    Socioeconomic History  . Marital status: Single    Spouse name: Not on file  . Number of children: Not on file  . Years of education: Not on file  . Highest education level: Not on file  Occupational History  . Not on file  Tobacco Use  . Smoking status: Never Smoker  . Smokeless tobacco: Never Used  Vaping Use  . Vaping Use: Never used  Substance and Sexual Activity  . Alcohol use: Never    Comment: socially  . Drug use: No  . Sexual activity: Yes  Other Topics Concern  . 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     Social Determinants of Health   Financial Resource Strain: Not on file  Food Insecurity: Not on file  Transportation Needs: Not on file  Physical Activity: Not on file  Stress: Not on file  Social Connections: Not on file   Allergies  Allergen Reactions  . Bee Venom Anaphylaxis    Medications   Medications Prior to Admission  Medication Sig Dispense Refill Last Dose  . amLODipine (NORVASC) 10 MG tablet Take 1 tablet (10 mg total) by mouth daily. Patient needs OV for future refills 30 tablet 1 02/27/2020  at Unknown time  . ramipril (ALTACE) 10 MG capsule Take 1 capsule (10 mg total) by mouth daily. Patient needs OV for future refills 30 capsule 1 02/27/2020 at Unknown time  . ciprofloxacin (CIPRO) 500 MG tablet Take 1 tablet (500 mg total) by mouth 2 (two) times daily. 20 tablet 0 02/26/2020     Vitals   Vitals:   02/27/20 1635 02/27/20 1645 02/27/20 1754 02/27/20 1819  BP:  (!) 190/118 (!) 194/118 (!) 175/112  Pulse:  94 98   Resp:  19 18   Temp: 98 F (36.7 C)  98.3 F (36.8 C)   TempSrc: Oral  Oral   SpO2:  100% 99%      There is no height or weight on file to calculate BMI.  Physical Exam   General: Laying comfortably in bed; in no acute distress.  HENT: Normal oropharynx and mucosa. Normal external appearance of ears and nose.  Neck: Supple, no pain or tenderness  CV: No JVD. No peripheral edema.   Pulmonary: Symmetric Chest rise. Normal respiratory effort.  Abdomen: Soft to touch, non-tender.  Ext: No cyanosis, edema, or deformity  Skin: No rash. Normal palpation of skin.   Musculoskeletal: Normal digits and nails by inspection. No clubbing.   Neurologic Examination  Mental status/Cognition: Alert, oriented to self, place, month and year, good attention.  Speech/language: Mildly dysarthric speech, Fluent, comprehension intact, object naming intact, repetition intact.  Cranial nerves:   CN II Pupils equal and reactive to light, no VF deficits    CN III,IV,VI EOM intact, no gaze preference or deviation, no nystagmus    CN V normal sensation in V1, V2, and V3 segments bilaterally    CN VII R facial droop   CN VIII normal hearing to speech   CN IX & X normal palatal elevation, no uvular deviation   CN XI 5/5 head turn and 5/5 shoulder shrug bilaterally   CN XII midline tongue protrusion   Motor:  Muscle bulk: normal, tone normal, pronator drift none tremor none Mvmt Root Nerve  Muscle Right Left Comments  SA C5/6 Ax Deltoid 5 5   EF C5/6 Mc Biceps 5 5   EE C6/7/8 Rad Triceps 5 5   WF C6/7 Med FCR 5 5   WE C7/8 PIN ECU 5 5   F Ab C8/T1 U ADM/FDI 5 5   HF L1/2/3 Fem Illopsoas 5 5   KE L2/3/4 Fem Quad 5 5   DF L4/5 D Peron Tib Ant 5 5   PF S1/2 Tibial Grc/Sol 5 5    Reflexes:  Right Left Comments  Pectoralis      Biceps (C5/6) 2 2   Brachioradialis (C5/6) 2 2    Triceps (C6/7) 2 2    Patellar (L3/4) 2+ 2+    Achilles (S1) 2 2    Hoffman      Plantar     Jaw jerk    Sensation:  Light touch Intact throughout   Pin prick    Temperature    Vibration   Proprioception    Coordination/Complex Motor:  - Finger to Nose intact BL - Heel to shin intact BL - Rapid alternating movement are normal - Gait: deferred. Labs   CBC:  Recent Labs  Lab 02/27/20 0931 02/27/20 0946  WBC 7.0  --   NEUTROABS 4.1  --   HGB 12.0* 12.2*  HCT 36.9* 36.0*  MCV 90.2  --   PLT  293  --  Basic Metabolic Panel:  Lab Results  Component Value Date   NA 144 02/27/2020   K 3.7 02/27/2020   CO2 26 02/27/2020   GLUCOSE 94 02/27/2020   BUN 14 02/27/2020   CREATININE 1.20 02/27/2020   CALCIUM 9.5 02/27/2020   GFRNONAA >60 02/27/2020   GFRAA >60 02/22/2019   Lipid Panel:  Lab Results  Component Value Date   LDLCALC 102 (H) 02/12/2020   HgbA1c:  Lab Results  Component Value Date   HGBA1C 5.8 (H) 02/12/2020   Urine Drug Screen:     Component Value Date/Time   LABOPIA NONE DETECTED 02/27/2020 1042   COCAINSCRNUR NONE DETECTED 02/27/2020 1042   LABBENZ NONE DETECTED 02/27/2020 1042   AMPHETMU NONE DETECTED 02/27/2020 1042   THCU NONE DETECTED 02/27/2020 1042   LABBARB NONE DETECTED 02/27/2020 1042    Alcohol Level     Component Value Date/Time   ETH <10 02/27/2020 0931    CT angio Head and Neck with contrast: IMPRESSION: 1. No large vessel occlusion, significant stenosis, or aneurysm in the head and neck. 2. Small left upper lobe pulmonary nodules measuring up to 4 mm. No follow-up needed if patient is low-risk (and has no known or suspected primary neoplasm). Non-contrast chest CT can be considered in 12 months if patient is high-risk. This recommendation follows the consensus statement: Guidelines for Management of Incidental Pulmonary Nodules Detected on CT Images: From the Fleischner Society 2017; Radiology 2017; 284:228-243. 3. Diffusely enlarged thyroid with a possible 1.5 cm nodule in the left lobe. Recommend thyroid US (ref: J Am Coll Radiol. 2015 Feb;12(2): 143-50).  MRI Brain  IMPRESSION: Intermittently motion degraded exam.  9 mm acute infarct centered within the posterior limb of left internal capsule. This infarct may also involve the adjacent left thalamus and lentiform nucleus.  Two chronic small-vessel infarcts within the left corona radiata/basal ganglia, new as compared to the head CT of  02/20/2019.  Redemonstrated chronic lacunar infarct within the right corona radiata.  Background mild cerebral white matter chronic small vessel ischemic Disease.   Impression   DMONTE MAHER is a 65 y.o. male with PMH significant for HTN who presents with slurred speech, found to have a 63mm ischemic infarct in the posterior limb of the left internal capsule. His neurologic examination is notable for R facial droop and slurred speech.  Recommendations  Plan:  Recommend that primary team order following: - Frequent Neuro checks per stroke unit protocol - TTE pending. - LDL pending - HbA1c pending. - Antithrombotic - Aspirin 81mg  daily. - Recommend DVT ppx - SBP goal - gradual normotension. - Recommend Telemetry monitoring for arrythmia - Recommend bedside swallow screen prior to PO intake. - Stroke education booklet - Recommend PT/OT/SLP consult  _______________________________________________________________  Thank you for the opportunity to take part in the care of this patient. If you have any further questions, please contact the neurology consultation attending.  Signed,  Elliott Pager Number [phone] _ _ _   _ __   _ __ _ _  __ __   _ __   __ _

## 2020-02-27 NOTE — ED Notes (Signed)
Called report to Melissa, RN 

## 2020-02-28 ENCOUNTER — Inpatient Hospital Stay (HOSPITAL_COMMUNITY): Payer: Medicaid Other

## 2020-02-28 DIAGNOSIS — I63312 Cerebral infarction due to thrombosis of left middle cerebral artery: Secondary | ICD-10-CM | POA: Diagnosis not present

## 2020-02-28 DIAGNOSIS — I10 Essential (primary) hypertension: Secondary | ICD-10-CM | POA: Diagnosis not present

## 2020-02-28 DIAGNOSIS — I639 Cerebral infarction, unspecified: Secondary | ICD-10-CM | POA: Diagnosis not present

## 2020-02-28 DIAGNOSIS — I6389 Other cerebral infarction: Secondary | ICD-10-CM | POA: Diagnosis not present

## 2020-02-28 LAB — LIPID PANEL
Cholesterol: 162 mg/dL (ref 0–200)
HDL: 41 mg/dL (ref >40)
LDL Cholesterol: 108 mg/dL — ABNORMAL HIGH (ref 0–99)
Total CHOL/HDL Ratio: 4 RATIO
Triglycerides: 65 mg/dL (ref <150)
VLDL: 13 mg/dL (ref 0–40)

## 2020-02-28 LAB — HIV ANTIBODY (ROUTINE TESTING W REFLEX): HIV Screen 4th Generation wRfx: NONREACTIVE

## 2020-02-28 LAB — ECHOCARDIOGRAM LIMITED
Area-P 1/2: 4.49 cm2
S' Lateral: 3.1 cm

## 2020-02-28 LAB — HEMOGLOBIN A1C
Hgb A1c MFr Bld: 6.2 % — ABNORMAL HIGH (ref 4.8–5.6)
Mean Plasma Glucose: 131.24 mg/dL

## 2020-02-28 MED ORDER — ASPIRIN EC 81 MG PO TBEC: 81.0000 mg | DELAYED_RELEASE_TABLET | Freq: Every day | ORAL | Status: DC

## 2020-02-28 MED ORDER — ASPIRIN 81 MG PO TBEC: 81.0000 mg | DELAYED_RELEASE_TABLET | Freq: Every day | ORAL | 11 refills | Status: AC

## 2020-02-28 MED ORDER — ATORVASTATIN CALCIUM 80 MG PO TABS: 80.0000 mg | ORAL_TABLET | Freq: Every day | ORAL | 2 refills | Status: DC

## 2020-02-28 MED ORDER — ATORVASTATIN CALCIUM 40 MG PO TABS
40.0000 mg | ORAL_TABLET | Freq: Every day | ORAL | Status: DC
  Administered 2020-02-28: 40 mg via ORAL
  Filled 2020-02-28: qty 1

## 2020-02-28 MED ORDER — CLOPIDOGREL BISULFATE 75 MG PO TABS
75.0000 mg | ORAL_TABLET | Freq: Every day | ORAL | Status: DC
  Administered 2020-02-28: 75 mg via ORAL
  Filled 2020-02-28: qty 1

## 2020-02-28 MED ORDER — CLOPIDOGREL BISULFATE 75 MG PO TABS: 75.0000 mg | ORAL_TABLET | Freq: Every day | ORAL | 0 refills | Status: AC

## 2020-02-28 NOTE — Evaluation (Signed)
Clinical/Bedside Swallow Evaluation Patient Details  Name: KHARON HIXON MRN: [id number] Date of Birth: 1956/01/07  Today's Date: 02/28/2020 Time: SLP Start Time (ACUTE ONLY): 1420 SLP Stop Time (ACUTE ONLY): 1440 SLP Time Calculation (min) (ACUTE ONLY): 20 min  Past Medical History:  Past Medical History:  Diagnosis Date  . Hypertension    Past Surgical History:  Past Surgical History:  Procedure Laterality Date  . APPENDECTOMY    . EYE SURGERY    . HAND SURGERY    . KNEE SURGERY     x3, x1 R  . LIPOMA EXCISION Left 10/13/2019   Procedure: SHOULDER EXCISION LIPOMA AND ROTATOR CUFF REPAIR;  Surgeon: Tania Ade, MD;  Location: [facility];  Service: Orthopedics;  Laterality: Left;  . TONSILLECTOMY     HPI:  65 y.o. male with medical history significant of HTN. Presenting with slurred speech. He states that his symptoms began suddenly 2 days ago. He felt a drawing of his face and his speech being "off". He didn't have any other symptoms. Pt failed initial Yale, but passed second Yale per nursing report.  BSE generated to f/u swallowing function d/t dysarthric speech; CXR on 02/27/20 Improved aeration at the LEFT base when compared to prior study. 2. Is linear opacities on the RIGHT stable, potentially scarring. New opacity in the LEFT chest may represent atelectasis or developing infection; MRI brain on 02/27/20 indicated 9 mm acute infarct centered within the posterior limb of left internal capsule. This infarct may also involve the adjacent left thalamus and lentiform nucleus.  Assessment / Plan / Recommendation Clinical Impression  Pt seen for clinical swallowing evaluation where various consistencies given including thin, puree and solids without overt s/s of aspiration noted; Slight right lingual/labial deviation noted, but functional for swallowing.  Pt with decreased intelligibility during simple conversation during BSE, but able to follow all directives (including  multi-step) and exhibited good attention during intake.  Recommend continue Regular/thin liquid diet.  ST will f/u for dysarthria tx, but s/o for swallowing tx as pt's swallow appears to be functional for current diet.  Thank you for this consult. SLP Visit Diagnosis: Dysphagia, unspecified (R13.10)    Aspiration Risk  Mild aspiration risk    Diet Recommendation   Regular/thin liquids  Medication Administration: Whole meds with liquid    Other  Recommendations Oral Care Recommendations: Oral care BID   Follow up Recommendations Outpatient SLP;Other (comment) (prn)      Frequency and Duration  (Evaluation only)          Prognosis Prognosis for Safe Diet Advancement: Good      Swallow Study   General Date of Onset: 02/27/20 HPI: 65 y.o. male with medical history significant of HTN. Presenting with slurred speech. He states that his symptoms began suddenly 2 days ago. He felt a drawing of his face and his speech being "off". He didn't have any other symptoms. Type of Study: Bedside Swallow Evaluation Previous Swallow Assessment: Yale failed once and then passed second time Diet Prior to this Study: Regular;Thin liquids Temperature Spikes Noted: No Respiratory Status: Room air History of Recent Intubation: No Behavior/Cognition: Alert;Cooperative;Pleasant mood Oral Cavity Assessment: Within Functional Limits Oral Care Completed by SLP: No Oral Cavity - Dentition: Adequate natural dentition Vision: Functional for self-feeding Self-Feeding Abilities: Able to feed self Patient Positioning: Upright in bed Baseline Vocal Quality: Low vocal intensity Volitional Cough: Strong Volitional Swallow: Able to elicit    Oral/Motor/Sensory Function Overall Oral Motor/Sensory Function: Mild impairment Facial  ROM: Reduced right Facial Symmetry: Abnormal symmetry right Lingual ROM: Reduced left Lingual Symmetry: Abnormal symmetry right   Ice Chips Ice chips: Within functional  limits Presentation: Self Fed   Thin Liquid Thin Liquid: Within functional limits Presentation: Cup;Straw    Nectar Thick Nectar Thick Liquid: Not tested   Honey Thick Honey Thick Liquid: Not tested   Puree Puree: Within functional limits Presentation: Self Fed   Solid     Solid: Within functional limits Presentation: Self Fed      Elvina Sidle, M.S., CCC-SLP 02/28/2020,4:37 PM

## 2020-02-28 NOTE — Progress Notes (Signed)
Pt discharge education and instructions completed with pt. Pt voices understanding and denies any questions. Pt IV and telemetry removed. Pt to pick up electronically sent prescriptions from preferred pharmacy on file. Pt discharge home with wife to transport him home. Pt transported off unit via wheelchair with belongings to the side. Delia Heady RN

## 2020-02-28 NOTE — Evaluation (Signed)
Physical Therapy Evaluation & Discharge Patient Details Name: Curtis Saunders MRN: [id number] DOB: [birth year] Today's Date: 02/28/2020   History of Present Illness  Pt is a 65 y.o. male who presents with slurred speech and R facial droop that began 2 days PTA. MRI revealed L internal capsule acute infarct adn 2 small-vessel infarcts in L corona radiata/basal ganglia. PMH of HTN.  Clinical Impression  Pt presents with condition above. PTA, he was independent with all functional mobility without AD/AE. Pt appears to be at his baseline for functional mobility as he is safe and independent with all bed mobility and transfers and was only provided supervision with gait without an AD/AE for safety today. He did display miniscule weakness in his L hip flexors and ankle dorsiflexors compared to his R and dysdiadochokinesia in his L leg with simultaneous foot tapping today. However, these minor differences do not appear to be affecting him functionally as he scored a 23 on the DGI today, indicating his is not at risk for falls. Provided handout and educated pt and his fiance on [employer]FA[employer]. All education completed and questions answered. PT will sign off at this time.      Follow Up Recommendations No PT follow up    Equipment Recommendations  None recommended by PT    Recommendations for Other Services       Precautions / Restrictions Precautions Precautions: Fall Precaution Comments: low fall risk Restrictions Weight Bearing Restrictions: No      Mobility  Bed Mobility Overal bed mobility: Independent             General bed mobility comments: Pt able to perform all bed mobility safely and in appropriate time frame.    Transfers Overall transfer level: Independent Equipment used: None             General transfer comment: Pt able to come to stand quickly without LOB.  Ambulation/Gait Ambulation/Gait assistance: Supervision Gait Distance (Feet): 250 Feet Assistive device:  None Gait Pattern/deviations: WFL(Within Functional Limits) Gait velocity: WFL Gait velocity interpretation: >4.37 ft/sec, indicative of normal walking speed General Gait Details: Pt ambulates WFL gait pattern, no significant difference between sides. Pt ambulates with bil leg external rotation, likely his baseline  Stairs            Wheelchair Mobility    Modified Rankin (Stroke Patients Only) Modified Rankin (Stroke Patients Only) Pre-Morbid Rankin Score: No symptoms Modified Rankin: No significant disability     Balance Overall balance assessment: No apparent balance deficits (not formally assessed)                               Standardized Balance Assessment Standardized Balance Assessment : Dynamic Gait Index   Dynamic Gait Index Level Surface: Normal Change in Gait Speed: Normal Gait with Horizontal Head Turns: Normal Gait with Vertical Head Turns: Normal Gait and Pivot Turn: Normal Step Over Obstacle: Normal Step Around Obstacles: Normal Steps: Mild Impairment (assumed, not tested as pt avoids stairs in community and no stairs at home) Total Score: 23       Pertinent Vitals/Pain Pain Assessment: No/denies pain    Home Living Family/patient expects to be discharged to:: Private residence Living Arrangements: Spouse/significant other Available Help at Discharge: Family Type of Home: House Home Access: Level entry     Home Layout: One level Home Equipment: None      Prior Function Level of Independence: Independent  Comments: Pt drives and is retired.     Hand Dominance   Dominant Hand: Right    Extremity/Trunk Assessment   Upper Extremity Assessment Upper Extremity Assessment: Defer to OT evaluation    Lower Extremity Assessment Lower Extremity Assessment: LLE deficits/detail LLE Deficits / Details: Slight weakness in L hip flexors and ankle dorsiflexors compared to R, but almost negligible LLE Sensation:  WNL LLE Coordination: decreased gross motor (dysdiadochokinesia noted but no dysmetria noted)    Cervical / Trunk Assessment Cervical / Trunk Assessment: Normal  Communication   Communication: Expressive difficulties (improving dysarthria)  Cognition Arousal/Alertness: Awake/alert Behavior During Therapy: WFL for tasks assessed/performed Overall Cognitive Status: Within Functional Limits for tasks assessed                                 General Comments: A&Ox4.      General Comments      Exercises     Assessment/Plan    PT Assessment Patent does not need any further PT services  PT Problem List         PT Treatment Interventions      PT Goals (Current goals can be found in the Care Plan section)  Acute Rehab PT Goals Patient Stated Goal: to go home PT Goal Formulation: With patient/family Time For Goal Achievement: 02/29/20 Potential to Achieve Goals: Good    Frequency     Barriers to discharge        Co-evaluation               AM-PAC PT "6 Clicks" Mobility  Outcome Measure Help needed turning from your back to your side while in a flat bed without using bedrails?: None Help needed moving from lying on your back to sitting on the side of a flat bed without using bedrails?: None Help needed moving to and from a bed to a chair (including a wheelchair)?: None Help needed standing up from a chair using your arms (e.g., wheelchair or bedside chair)?: None Help needed to walk in hospital room?: None Help needed climbing 3-5 steps with a railing? : A Little 6 Click Score: 23    End of Session Equipment Utilized During Treatment: Gait belt Activity Tolerance: Patient tolerated treatment well Patient left: in bed;with call bell/phone within reach;with family/visitor present   PT Visit Diagnosis: Muscle weakness (generalized) (M62.81);Other symptoms and signs involving the nervous system (R29.898)    Time: 2119-4174 PT Time Calculation  (min) (ACUTE ONLY): 24 min   Charges:   PT Evaluation $PT Eval Low Complexity: 1 Low PT Treatments $Gait Training: 8-22 mins        Moishe Spice, PT, DPT Acute Rehabilitation Services  Pager: [phone] Office: [place] 02/28/2020, 5:17 PM

## 2020-02-28 NOTE — Progress Notes (Signed)
  Echocardiogram 2D Echocardiogram has been performed.  Curtis Saunders 02/28/2020, 12:11 PM

## 2020-02-28 NOTE — Plan of Care (Signed)

## 2020-02-28 NOTE — Discharge Summary (Signed)
Physician Discharge Summary  Curtis Saunders [id number] DOB: [birth year] DOA: 02/27/2020  PCP: Patient, [name]  Admit date: 02/27/2020 Discharge date: 02/29/2020  Admitted From: Home Disposition: Home.  Recommendations for Outpatient Follow-up:  1. Follow up with PCP in 1-2 weeks 2. Follow-up with neurology, office to schedule follow-up. 3. Follow-up with cardiology, will send referral to follow-up.  Home Health: Outpatient sleep therapy Equipment/Devices: None required  Discharge Condition: Stable CODE STATUS: Fair Diet recommendation: Low-salt diet  Discharge summary:  65 year old gentleman with history of hypertension but no other medical issues, recent mildly symptomatic Covid infection presented to the emergency room with slurred speech, right-sided facial droop.  He was found to have posterior limb of left internal capsule acute infarct on his MRI.  Only deficit was slurred speech and right facial droop that i guesss Alert improving.  Clinical findings, slurred speech and right facial droop, improving.  Mostly normalizing. CTA head and Neck, no large vessel occlusion.  No significant stenosis.   MRI of the brain, 9 mm acute infarct. 2D echocardiogram, normal EF. Severe left ventricular concentric hypertrophy. No embolus. Antiplatelet therapy, LDL 108. Not on statin at home.  Hemoglobin A1c 6.2.   Plan:  Patient without any motor or sensory deficit of the extremities.  Right facial droop and slurred speech improving. Seen by PT, no recommendations. Seen by OT, no follow-up needed. Seen by speech, recommended outpatient speech therapy.  We will send referral. Patient was not on antiplatelet before admission, discharged on aspirin 81 mg daily and Plavix 75 mg daily for 3 weeks.  He will continue aspirin after that. Blood pressures fairly stable on amlodipine 10 mg and ramipril 10 mg.  Severe left ventricular concentric hypertrophy: New diagnosis.  Patient with no  evidence of arrhythmias or recurrent syncope. Discussed with Dr. Quentin Ore from cardiology.  Patient is established patient at [facility]. He recommended to schedule follow up as outpatient. Will send referral. 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   By late evening, patient completed his stroke work-up and wanted to go home.  He was discharged with scheduled outpatient follow-up with neurology and cardiology.     Discharge Diagnoses:  Active Problems:   CVA (cerebral vascular accident) [facility])    Discharge Instructions  Discharge Instructions    Ambulatory referral to Cardiology   Complete by: As directed    Ambulatory referral to Neurology   Complete by: As directed    Follow up with stroke clinic NP (Jessica Vanschaick or Cecille Rubin, if both not available, consider Zachery Dauer, or Ahern) at [facility] in about 4 weeks. Thanks.   Ambulatory referral to Speech Therapy   Complete by: As directed    Diet - low sodium heart healthy   Complete by: As directed    Increase activity slowly   Complete by: As directed      Allergies as of 02/28/2020      Reactions   Bee Venom Anaphylaxis      Medication List    STOP taking these medications   ciprofloxacin 500 MG tablet Commonly known as: Cipro     TAKE these medications   amLODipine 10 MG tablet Commonly known as: NORVASC Take 1 tablet (10 mg total) by mouth daily. Patient needs OV for future refills   aspirin 81 MG EC tablet Take 1 tablet (81 mg total) by mouth daily. Swallow whole.   atorvastatin 80 MG tablet Commonly known as: LIPITOR Take 1 tablet (80 mg total) by mouth at  bedtime.   clopidogrel 75 MG tablet Commonly known as: Plavix Take 1 tablet (75 mg total) by mouth daily for 21 days.   ramipril 10 MG capsule Commonly known as: ALTACE Take 1 capsule (10 mg total) by mouth daily. Patient needs OV for future refills       Follow-up Information    Minus Breeding, MD. Schedule an appointment as  soon as possible for a visit in 1 week(s).   Specialty: Cardiology Contact information: [address] [name] [address] [phone]        [facility]. Schedule an appointment as soon as possible for a visit in 4 week(s).   Specialty: Neurology Contact information: [place] [phone]             Allergies  Allergen Reactions  . Bee Venom Anaphylaxis    Consultations:  Neurology   Procedures/Studies: CT Angio Head W or Wo Contrast  Result Date: 02/27/2020 CLINICAL DATA:  Aphasia. Right-sided facial droop. Acute left internal capsule infarct on MRI. EXAM: CT ANGIOGRAPHY HEAD AND NECK TECHNIQUE: Multidetector CT imaging of the head and neck was performed using the standard protocol during bolus administration of intravenous contrast. Multiplanar CT image reconstructions and MIPs were obtained to evaluate the vascular anatomy. Carotid stenosis measurements (when applicable) are obtained utilizing NASCET criteria, using the distal internal carotid diameter as the denominator. CONTRAST:  174mL OMNIPAQUE IOHEXOL 350 MG/ML SOLN COMPARISON:  Head MRI 02/27/2020 FINDINGS: CT HEAD FINDINGS Brain: There is a small focus of vague hypoattenuation in the posterior limb of the left internal capsule corresponding to the acute infarct on MRI. Chronic lacunar infarcts are again noted in the left basal ganglia and right corona radiata. No intracranial hemorrhage, mass, midline shift, or extra-axial fluid collection, or hydrocephalus is evident. Vascular: No hyperdense vessel. Skull: No fracture or suspicious osseous lesion. Sinuses: Visualized paranasal sinuses and mastoid air cells are clear. Orbits: Unremarkable. Review of the MIP images confirms the above findings CTA NECK FINDINGS Aortic arch: Normal variant aortic arch branching pattern with common origin of the brachiocephalic and left common carotid  arteries. Widely patent arch vessel origins. Right carotid system: Patent without evidence of stenosis, dissection, or significant atherosclerosis. Left carotid system: Patent without evidence of stenosis, dissection, or significant atherosclerosis. Vertebral arteries: Patent without evidence of stenosis, dissection, or significant atherosclerosis. Codominant. Skeleton: Advanced disc degeneration throughout the mid and lower cervical spine. Other neck: Diffusely enlarged thyroid with a questionable 1.5 cm slightly hypoattenuating nodule in the left lobe. Upper chest: Small pulmonary nodules posteriorly in the left upper lobe measuring up to 4 mm, largely new from a 02/20/2018 chest CTA. Review of the MIP images confirms the above findings CTA HEAD FINDINGS Anterior circulation: The internal carotid arteries are widely patent from skull base to carotid termini. ACAs and MCAs are patent without evidence of a proximal branch occlusion or significant proximal stenosis. The right A1 segment is hypoplastic. No aneurysm is identified. Posterior circulation: The intracranial vertebral arteries are widely patent to the basilar. Patent left PICA, right AICA, and bilateral SCA origins are identified. The basilar artery is widely patent. Posterior communicating arteries are not clearly identified and may be diminutive or absent. Both PCAs are patent without evidence of a significant proximal stenosis. No aneurysm is identified. Venous sinuses: Patent. Anatomic variants: Hypoplastic right A1. Review of the MIP images confirms the above findings IMPRESSION: 1. No large vessel occlusion, significant stenosis, or aneurysm in  the head and neck. 2. Small left upper lobe pulmonary nodules measuring up to 4 mm. No follow-up needed if patient is low-risk (and has no known or suspected primary neoplasm). Non-contrast chest CT can be considered in 12 months if patient is high-risk. This recommendation follows the consensus statement:  Guidelines for Management of Incidental Pulmonary Nodules Detected on CT Images: From the Fleischner Society 2017; Radiology 2017; 284:228-243. 3. Diffusely enlarged thyroid with a possible 1.5 cm nodule in the left lobe. Recommend thyroid US (ref: J Am Coll Radiol. 2015 Feb;12(2): 143-50). Electronically Signed   By: Logan Bores M.D.   On: 02/27/2020 12:34   CT Angio Neck W and/or Wo Contrast  Result Date: 02/27/2020 CLINICAL DATA:  Aphasia. Right-sided facial droop. Acute left internal capsule infarct on MRI. EXAM: CT ANGIOGRAPHY HEAD AND NECK TECHNIQUE: Multidetector CT imaging of the head and neck was performed using the standard protocol during bolus administration of intravenous contrast. Multiplanar CT image reconstructions and MIPs were obtained to evaluate the vascular anatomy. Carotid stenosis measurements (when applicable) are obtained utilizing NASCET criteria, using the distal internal carotid diameter as the denominator. CONTRAST:  171mL OMNIPAQUE IOHEXOL 350 MG/ML SOLN COMPARISON:  Head MRI 02/27/2020 FINDINGS: CT HEAD FINDINGS Brain: There is a small focus of vague hypoattenuation in the posterior limb of the left internal capsule corresponding to the acute infarct on MRI. Chronic lacunar infarcts are again noted in the left basal ganglia and right corona radiata. No intracranial hemorrhage, mass, midline shift, or extra-axial fluid collection, or hydrocephalus is evident. Vascular: No hyperdense vessel. Skull: No fracture or suspicious osseous lesion. Sinuses: Visualized paranasal sinuses and mastoid air cells are clear. Orbits: Unremarkable. Review of the MIP images confirms the above findings CTA NECK FINDINGS Aortic arch: Normal variant aortic arch branching pattern with common origin of the brachiocephalic and left common carotid arteries. Widely patent arch vessel origins. Right carotid system: Patent without evidence of stenosis, dissection, or significant atherosclerosis. Left carotid  system: Patent without evidence of stenosis, dissection, or significant atherosclerosis. Vertebral arteries: Patent without evidence of stenosis, dissection, or significant atherosclerosis. Codominant. Skeleton: Advanced disc degeneration throughout the mid and lower cervical spine. Other neck: Diffusely enlarged thyroid with a questionable 1.5 cm slightly hypoattenuating nodule in the left lobe. Upper chest: Small pulmonary nodules posteriorly in the left upper lobe measuring up to 4 mm, largely new from a 02/20/2018 chest CTA. Review of the MIP images confirms the above findings CTA HEAD FINDINGS Anterior circulation: The internal carotid arteries are widely patent from skull base to carotid termini. ACAs and MCAs are patent without evidence of a proximal branch occlusion or significant proximal stenosis. The right A1 segment is hypoplastic. No aneurysm is identified. Posterior circulation: The intracranial vertebral arteries are widely patent to the basilar. Patent left PICA, right AICA, and bilateral SCA origins are identified. The basilar artery is widely patent. Posterior communicating arteries are not clearly identified and may be diminutive or absent. Both PCAs are patent without evidence of a significant proximal stenosis. No aneurysm is identified. Venous sinuses: Patent. Anatomic variants: Hypoplastic right A1. Review of the MIP images confirms the above findings IMPRESSION: 1. No large vessel occlusion, significant stenosis, or aneurysm in the head and neck. 2. Small left upper lobe pulmonary nodules measuring up to 4 mm. No follow-up needed if patient is low-risk (and has no known or suspected primary neoplasm). Non-contrast chest CT can be considered in 12 months if patient is high-risk. This recommendation follows the consensus  statement: Guidelines for Management of Incidental Pulmonary Nodules Detected on CT Images: From the Fleischner Society 2017; Radiology 2017; 284:228-243. 3. Diffusely enlarged  thyroid with a possible 1.5 cm nodule in the left lobe. Recommend thyroid US (ref: J Am Coll Radiol. 2015 Feb;12(2): 143-50). Electronically Signed   By: Logan Bores M.D.   On: 02/27/2020 12:34   MR BRAIN WO CONTRAST  Result Date: 02/27/2020 CLINICAL DATA:  Neuro deficit, acute, stroke suspected. Additional history provided: Aphasia and right-sided facial droop. EXAM: MRI HEAD WITHOUT CONTRAST TECHNIQUE: Multiplanar, multiecho pulse sequences of the brain and surrounding structures were obtained without intravenous contrast. COMPARISON:  Prior head CT examinations 02/20/2019 and earlier. FINDINGS: Brain: Intermittently motion degraded exam. Most notably, there is moderate motion degradation of the coronal T2 weighted sequence. Cerebral volume is normal for age. 9 mm focus of restricted diffusion consistent with acute infarction centered within the posterior limb of left internal capsule. This acute infarct may also involve the adjacent left thalamus and lentiform nucleus. There are two small vessel infarcts within the left corona radiata/basal ganglia, which appear new as compared to the prior head CT of 02/20/2019. Redemonstrated chronic lacunar infarct within the right corona radiata. Mild multifocal T2/FLAIR hyperintensity within the cerebral white matter is nonspecific, but compatible with chronic small vessel ischemic disease. Chronic microhemorrhage within the right cerebellar hemisphere. Additional subtle SWI signal loss within the left basal ganglia which may reflect mineralization or chronic hemosiderin deposition. No evidence of intracranial mass. No extra-axial fluid collection. No midline shift. Vascular: Expected proximal arterial flow voids. Skull and upper cervical spine: No focal marrow lesion. Sinuses/Orbits: Visualized orbits show no acute finding. No significant paranasal sinus disease. IMPRESSION: Intermittently motion degraded exam. 9 mm acute infarct centered within the posterior limb of  left internal capsule. This infarct may also involve the adjacent left thalamus and lentiform nucleus. Two chronic small-vessel infarcts within the left corona radiata/basal ganglia, new as compared to the head CT of 02/20/2019. Redemonstrated chronic lacunar infarct within the right corona radiata. Background mild cerebral white matter chronic small vessel ischemic disease. Electronically Signed   By: Kellie Simmering DO   On: 02/27/2020 11:35   DG CHEST PORT 1 VIEW  Result Date: 02/27/2020 CLINICAL DATA:  65 year old male with aphasia that started 2 days ago. RIGHT-sided facial droop also present by report. EXAM: PORTABLE CHEST 1 VIEW COMPARISON:  February 12, 2020. FINDINGS: Cardiomediastinal contours are stable.  Hilar structures are normal. Improved aeration at the LEFT base when compared to the prior study. Stable linear opacities in the RIGHT mid chest with linear opacity in the LEFT mid chest that is new compared to the prior exam. No sign of lobar consolidative process or pleural effusion. On limited assessment no acute skeletal process. IMPRESSION: 1. Improved aeration at the LEFT base when compared to prior study. 2. Is linear opacities on the RIGHT stable, potentially scarring. New opacity in the LEFT chest may represent atelectasis or developing infection. Electronically Signed   By: Zetta Bills M.D.   On: 02/27/2020 13:19   DG Chest Portable 1 View  Result Date: 02/12/2020 CLINICAL DATA:  Syncope EXAM: PORTABLE CHEST 1 VIEW COMPARISON:  Chest radiograph and chest CT February 20, 2018 FINDINGS: There is a subtle area of opacity in the right mid lung. The lungs elsewhere are clear. Heart is upper normal in size with pulmonary vascularity normal. No adenopathy. No bone lesions. IMPRESSION: Subtle increased opacity right mid lung which may represent developing pneumonia. This finding  may warrant check of COVID-19 status. Lungs otherwise clear. Heart upper normal in size. No adenopathy. Electronically  Signed   By: Lowella Grip III M.D.   On: 02/12/2020 12:58   US SCROTUM W/DOPPLER  Result Date: 02/20/2020 CLINICAL DATA:  Right testicular pain/swelling EXAM: SCROTAL ULTRASOUND DOPPLER ULTRASOUND OF THE TESTICLES TECHNIQUE: Complete ultrasound examination of the testicles, epididymis, and other scrotal structures was performed. Color and spectral Doppler ultrasound were also utilized to evaluate blood flow to the testicles. COMPARISON:  10/30/2017 FINDINGS: Right testicle Measurements: 3.9 x 2.4 x 2.4 cm. No mass or microlithiasis visualized. Left testicle Measurements: 3.6 x 1.7 x 2.1 cm. Heterogeneous parenchymal appearance, unchanged. No mass or microlithiasis visualized. Right epididymis:  Normal in size and appearance. Left epididymis:  Normal in size and appearance. Hydrocele: Moderate right hydrocele, similar versus mildly decreased from the prior. Varicocele:  None visualized. Pulsed Doppler interrogation of both testes demonstrates normal low resistance arterial and venous waveforms bilaterally. IMPRESSION: Moderate right hydrocele, similar versus mildly decreased from the prior. Heterogeneous parenchymal appearance of the left testis, possibly related to prior infection/trauma, chronic. No evidence of testicular torsion. Electronically Signed   By: Julian Hy M.D.   On: 02/20/2020 09:39   ECHOCARDIOGRAM LIMITED  Result Date: 02/28/2020    ECHOCARDIOGRAM LIMITED REPORT   Patient Name:   MAHAMED LUKOSE Date of Exam: 02/28/2020 Medical Rec #:  [id number]        Height:       72.0 in Accession #:    [id number]       Weight:       211.6 lb Date of Birth:  Jun 16, 1955        BSA:          2.182 m Patient Age:    65 years         BP:           144/95 mmHg Patient Gender: M                HR:           70 bpm. Exam Location:  Inpatient Procedure: Limited Echo, Cardiac Doppler and Color Doppler Indications:    Stroke  History:        Patient has prior history of Echocardiogram examinations, most                  recent 02/20/2018. Signs/Symptoms:Chest Pain; Risk                 Factors:Hypertension. LVH. COVID-19.  Sonographer:    [name] Lefort RDCS (AE) Referring Phys: [id number] Jonnie Finner  Sonographer Comments: COVID-19. IMPRESSIONS  1. 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  2. Left ventricular ejection fraction, by estimation, is 65 to 70%. The left ventricle has normal function. There is severe concentric left ventricular hypertrophy. Left ventricular diastolic parameters are consistent with Grade I diastolic dysfunction (impaired relaxation).  3. Left atrial size was mildly dilated.  4. Mild mitral valve regurgitation.  5. Mild aortic valve sclerosis is present, with no evidence of aortic valve stenosis.  6. There is mild dilatation of the ascending aorta, measuring 41 mm. FINDINGS  Left Ventricle: Left ventricular ejection fraction, by estimation, is 65 to 70%. The left ventricle has normal function. There is severe concentric left ventricular hypertrophy. Left ventricular diastolic parameters are consistent with Grade I diastolic  dysfunction (impaired relaxation). Normal left ventricular filling pressure. Left  Atrium: Left atrial size was mildly dilated. Pericardium: Trivial pericardial effusion is present. Mitral Valve: Mild mitral valve regurgitation. Tricuspid Valve: Tricuspid valve regurgitation is mild. Aortic Valve: Mild aortic valve sclerosis is present, with no evidence of aortic valve stenosis. Aorta: There is mild dilatation of the ascending aorta, measuring 41 mm. LEFT VENTRICLE PLAX 2D LVIDd:         4.70 cm  Diastology LVIDs:         3.10 cm  LV e' medial:    5.87 cm/s LV PW:         1.80 cm  LV E/e' medial:  6.2 LV IVS:        2.20 cm  LV e' lateral:   6.42 cm/s LVOT diam:     2.20 cm  LV E/e' lateral: 5.7 LVOT Area:     3.80 cm  IVC IVC diam: 1.70 cm LEFT ATRIUM         Index LA diam:    4.00  cm 1.83 cm/m   AORTA Ao Root diam: 3.90 cm Ao Asc diam:  4.10 cm MITRAL VALVE MV Area (PHT): 4.49 cm    SHUNTS MV Decel Time: 169 msec    Systemic Diam: 2.20 cm MV E velocity: 36.40 cm/s MV A velocity: 53.60 cm/s MV E/A ratio:  0.68 Ena Dawley MD Electronically signed by Ena Dawley MD Signature Date/Time: 02/28/2020/1:35:45 PM    Final    (Echo, Carotid, EGD, Colonoscopy, ERCP)    Subjective: Patient seen and examined.  He was examined in the evening for discharge readiness.  Patient was able to have a clear conversation.  His voice was more clear.  Wife at the bedside.  Wants to go home. Discussed about echocardiogram findings and follow-up plans.   Discharge Exam: Vitals:   02/28/20 1238 02/28/20 1529  BP: (!) 135/92 (!) 159/103  Pulse: 72 72  Resp: 16 18  Temp: 99 F (37.2 C) 98.2 F (36.8 C)  SpO2: 96% 99%   Vitals:   02/28/20 0944 02/28/20 0946 02/28/20 1238 02/28/20 1529  BP: (!) 142/100 (!) 144/95 (!) 135/92 (!) 159/103  Pulse:  64 72 72  Resp:  16 16 18   Temp: 98.6 F (37 C) 98.3 F (36.8 C) 99 F (37.2 C) 98.2 F (36.8 C)  TempSrc: Oral Oral Oral Oral  SpO2:  98% 96% 99%    General: Pt is alert, awake, not in acute distress Cardiovascular: RRR, S1/S2 +, no rubs, no gallops Respiratory: CTA bilaterally, no wheezing, no rhonchi Abdominal: Soft, NT, ND, bowel sounds + Extremities: no edema, no cyanosis Patient has mild right facial droop.  He had slight slurring of speech.  Upper and lower extremity motor and sensory far normal.    The results of significant diagnostics from this hospitalization (including imaging, microbiology, ancillary and laboratory) are listed below for reference.     Microbiology: Recent Results (from the past 240 hour(s))  Urine Culture     Status: Abnormal   Collection Time: 02/20/20 10:27 AM   Specimen: Urine, Clean Catch  Result Value Ref Range Status   Specimen Description   Final    URINE, CLEAN CATCH Performed at  [facility], [place] [address], [place], [place] [address]    Special Requests   Final    NONE Performed at [facility], [place] [address], [place], [place] [address]    Culture MULTIPLE SPECIES PRESENT, SUGGEST RECOLLECTION (A)  Final   Report Status 02/21/2020 FINAL  Final     Labs: BNP (last 3 results) No results for input(s): BNP in the last 8760 hours. Basic Metabolic Panel: Recent Labs  Lab 02/27/20 0931 02/27/20 0946  NA 144 144  K 3.7 3.7  CL 110 110  CO2 26  --   GLUCOSE 100* 94  BUN 15 14  CREATININE 1.18 1.20  CALCIUM 9.5  --    Liver Function Tests: Recent Labs  Lab 02/27/20 0931  AST 19  ALT 34  ALKPHOS 76  BILITOT 0.6  PROT 7.4  ALBUMIN 3.9   No results for input(s): LIPASE, AMYLASE in the last 168 hours. No results for input(s): AMMONIA in the last 168 hours. CBC: Recent Labs  Lab 02/27/20 0931 02/27/20 0946  WBC 7.0  --   NEUTROABS 4.1  --   HGB 12.0* 12.2*  HCT 36.9* 36.0*  MCV 90.2  --   PLT 293  --    Cardiac Enzymes: No results for input(s): CKTOTAL, CKMB, CKMBINDEX, TROPONINI in the last 168 hours. BNP: Invalid input(s): POCBNP CBG: No results for input(s): GLUCAP in the last 168 hours. D-Dimer No results for input(s): DDIMER in the last 72 hours. Hgb A1c Recent Labs    02/28/20 0248  HGBA1C 6.2*   Lipid Profile Recent Labs    02/28/20 0248  CHOL 162  HDL 41  LDLCALC 108*  TRIG 65  CHOLHDL 4.0   Thyroid function studies No results for input(s): TSH, T4TOTAL, T3FREE, THYROIDAB in the last 72 hours.  Invalid input(s): FREET3 Anemia work up No results for input(s): VITAMINB12, FOLATE, FERRITIN, TIBC, IRON, RETICCTPCT in the last 72 hours. Urinalysis    Component Value Date/Time   COLORURINE YELLOW 02/27/2020 1042   APPEARANCEUR CLEAR 02/27/2020 1042   LABSPEC 1.013 02/27/2020 1042   PHURINE 5.0 02/27/2020 1042   GLUCOSEU NEGATIVE 02/27/2020 1042   HGBUR NEGATIVE 02/27/2020  1042   BILIRUBINUR NEGATIVE 02/27/2020 1042   KETONESUR NEGATIVE 02/27/2020 1042   PROTEINUR NEGATIVE 02/27/2020 1042   UROBILINOGEN 0.2 08/08/2019 1050   NITRITE NEGATIVE 02/27/2020 1042   LEUKOCYTESUR NEGATIVE 02/27/2020 1042   Sepsis Labs Invalid input(s): PROCALCITONIN,  WBC,  LACTICIDVEN Microbiology Recent Results (from the past 240 hour(s))  Urine Culture     Status: Abnormal   Collection Time: 02/20/20 10:27 AM   Specimen: Urine, Clean Catch  Result Value Ref Range Status   Specimen Description   Final    URINE, CLEAN CATCH Performed at [facility], [place] [address] [place], [place] [address]    Special Requests   Final    NONE Performed at [facility], [place] [address], [place],  [address]    Culture MULTIPLE SPECIES PRESENT, SUGGEST RECOLLECTION (A)  Final   Report Status 02/21/2020 FINAL  Final     Time coordinating discharge: 35 minutes  SIGNED:   Barb Merino, MD  [facility] 02/29/2020, 1:57 PM

## 2020-02-28 NOTE — Progress Notes (Signed)
STROKE TEAM PROGRESS NOTE   INTERVAL HISTORY Patient is evaluated at the bedside.  No family present in the room.  Vitals stable.  Patient is afebrile. Tolerating regular diet.  Patient wants to go home soon.  On neuro examination today, patient is alert, oriented x4, patient has mild right sided facial droop, and mild deviation of tongue to right side.  Speech is slurred. No weakness in Rt UE, mild proximal muscle weakness in left UE noted with restriction due to rotator cuff injury (Sep'21).  Mild dysmetria but no ataxia noted in BLE's.   MRI revealed  infarct within the posterior limb of left internal capsule, involving the adjacent left thalamus and lentiform nucleus. CTA shows no large vessel occlusion or stenosis. ECHO results pending. A1c 6.2, U tox negative. LDL 108. Patient is on aspirin 325 mg. NPO now. Patient did not pass bedside swallow evaluation and c/o cough. Formal speech/swallow eval pending.  PT Eval pending. No more neurological interventions needed. Pt can be discharged after he passes the swallow eval. Recommend continuing Aspirin 81 mg and Statin 40 mg at discharge. Neurology Stroke service will sign off.   Vitals:   02/28/20 0332 02/28/20 0532 02/28/20 0944 02/28/20 0946  BP: 130/85 (!) 130/93 (!) 142/100 (!) 144/95  Pulse: 69   64  Resp: 16 18  16   Temp: 98 F (36.7 C) 98 F (36.7 C) 98.6 F (37 C) 98.3 F (36.8 C)  TempSrc: Oral Oral Oral Oral  SpO2: 100% 98%  98%   CBC:  Recent Labs  Lab 02/27/20 0931 02/27/20 0946  WBC 7.0  --   NEUTROABS 4.1  --   HGB 12.0* 12.2*  HCT 36.9* 36.0*  MCV 90.2  --   PLT 293  --    Basic Metabolic Panel:  Recent Labs  Lab 02/27/20 0931 02/27/20 0946  NA 144 144  K 3.7 3.7  CL 110 110  CO2 26  --   GLUCOSE 100* 94  BUN 15 14  CREATININE 1.18 1.20  CALCIUM 9.5  --    Lipid Panel:  Recent Labs  Lab 02/28/20 0248  CHOL 162  TRIG 65  HDL 41  CHOLHDL 4.0  VLDL 13  LDLCALC 108*   HgbA1c:  Recent Labs  Lab  02/28/20 0248  HGBA1C 6.2*   Urine Drug Screen:  Recent Labs  Lab 02/27/20 1042  LABOPIA NONE DETECTED  COCAINSCRNUR NONE DETECTED  LABBENZ NONE DETECTED  AMPHETMU NONE DETECTED  THCU NONE DETECTED  LABBARB NONE DETECTED    Alcohol Level  Recent Labs  Lab 02/27/20 0931  ETH <10    IMAGING past 24 hours DG CHEST PORT 1 VIEW  Result Date: 02/27/2020 CLINICAL DATA:  65 year old male with aphasia that started 2 days ago. RIGHT-sided facial droop also present by report. EXAM: PORTABLE CHEST 1 VIEW COMPARISON:  February 12, 2020. FINDINGS: Cardiomediastinal contours are stable.  Hilar structures are normal. Improved aeration at the LEFT base when compared to the prior study. Stable linear opacities in the RIGHT mid chest with linear opacity in the LEFT mid chest that is new compared to the prior exam. No sign of lobar consolidative process or pleural effusion. On limited assessment no acute skeletal process. IMPRESSION: 1. Improved aeration at the LEFT base when compared to prior study. 2. Is linear opacities on the RIGHT stable, potentially scarring. New opacity in the LEFT chest may represent atelectasis or developing infection. Electronically Signed   By: Jewel Baize.D.  On: 02/27/2020 13:19    PHYSICAL EXAM GENERAL: Awake, Pleasant AA male sitting in bed comfortably in NAD. He is alert and oriented. HEENT: - Normocephalic and atraumatic, mild deviation of tongue to right side. LUNGS - Symmetrical chest rise, No labored breathing noted CV - no JVD, No Peripheral Edema. ABDOMEN - Soft,  nondistended  Ext: warm, well perfused, no Peripheral edema.     NEURO Exam:   Mental Status: AA&Ox4, Oriented to self, age, place, time, situation. Knows date, month and year. Good Attention and Concentration Language: speech is slurred.  Pt is able to name simple objects, repetition intact,  fluency, and comprehension intact. Cranial Nerves:   CN II Pupils equal and reactive to light, no VF  deficits    CN III,IV,VI EOM intact, no gaze preference or deviation, no nystagmus   CN V normal sensation in V1, V2, and V3 segments bilaterally    CN VII patient has mild right sided facial droop..    CN VIII normal hearing to speech    CN IX & X normal palatal elevation, no uvular deviation    CN XI 5/5 head turn and 5/5 shoulder shrug bilaterally    CN XII mild deviation of tongue to right side.      Motor: Drift in Lt Upper Extremity with restricted movement at Lt shoulder due to Rotator cuff injury (Sep 2021) , No Drift in LE's. Strength 5/5 in Rt UE, 5/5 in Lt UE, Strength in Rt LE  5/5, Lt LE 5/5 Tone: is normal and bulk is normal Sensation- Intact to light touch bilaterally. Coordination: FTN intact bilaterally, Mild dysmetria but no ataxia noted in BLE's. Gait- deferred.  ASSESSMENT/PLAN Curtis Saunders is a 65 y.o. male with history of HTN presenting with slurred speech, right facial droop. MRI revealed  infarct within the posterior limb of left internal capsule, involving the adjacent left thalamus and lentiform nucleus. CTA shows no large vessel occlusion or stenosis. ECHO results pending. Patient is on aspirin 325 mg. Patient did not pass bedside swallow evaluation and c/o cough. Formal speech/swallow eval pending.  PT Eval pending. No more neurological interventions needed. Pt can be discharged after he passes the swallow eval. Recommend continuing Aspirin 81 mg and Statin 40 mg at discharge. Neurology stroke service will sign off.   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 This infarct may also involve the adjacent left  thalamus and lentiform nucleus.Two chronic small-vessel infarcts within the left corona radiata/basal ganglia, new as compared to the head CT of 02/20/2019. Redemonstrated chronic lacunar infarct within the right corona radiata.Background mild  cerebral white matter chronic small vessel ischemic disease.  CTA head & neck No large vessel occlusion, significant stenosis, or aneurysm in the head and neck. Small left upper lobe pulmonary nodules measuring up to 4 mm. No follow-up needed if patient is low-risk, Diffusely enlarged thyroid with a possible 1.5 cm nodule in the left lobe.  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  EF 65 to 70%  LDL 108  HgbA1c 6.2  VTE prophylaxis -none  None prior to admission, now on aspirin 325 mg daily. Can change to 81 mg and Plavix 75 after he passes swallow eval.  Therapy recommendations:  Pending  Disposition: Pending  Cardiomyopathy  EF 65 to 70%.  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 Gradually normalize in 2-3 days . Long-term BP goal normotensive  Hyperlipidemia  Home meds:  None,   LDL 108, goal < 70  on Lipitor 40 mg.   Continue Lipitor 40 mg at discharge  Other Stroke Risk Factors  Family hx stroke in Mother  COVID 02/12/20  Hospital day # 1  ATTENDING NOTE: I reviewed above note and agree with the assessment and plan. Pt was seen and examined.   65 year old male with history of hypertension admitted for slurred speech and right facial droop.  MRI showed left posterior limb of finger capsule infarct, and old left caudate lacunar infarct.  CT head and neck unremarkable.  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  A1c 6.2, LDL 108.  UDS negative.  Creatinine 1.20, sodium 144.  On exam, patient awake alert, orientated x3.  Had mild dysarthria with right facial droop.  Otherwise neurologically intact.  Etiology for patient stroke likely due to small vessel disease given location and risk factors.  Recommend aspirin 81 and  Plavix 75 DAPT for 3 weeks and then aspirin alone.  Continue Lipitor 40 on discharge.  Agree with cardiology consult inpatient or outpatient for severe concentric cardiomyopathy.  Neurology will sign off. Please call with questions. Pt will follow up with stroke clinic NP at [facility] in about 4 weeks. Thanks for the consult.   Rosalin Hawking, MD PhD Stroke Neurology 02/28/2020 4:29 PM     To contact Stroke Continuity provider, please refer to [email or web address] After hours, contact General Neurology

## 2020-02-28 NOTE — Evaluation (Signed)
Occupational Therapy Evaluation Patient Details Name: Curtis Saunders MRN: [id number] DOB: 03/13/1955 Today's Date: 02/28/2020    History of Present Illness Pt is a 65 y.o. male who presents with slurred speech and R facial droop that began 2 days PTA. MRI revealed L internal capsule acute infarct adn 2 small-vessel infarcts in L corona radiata/basal ganglia. PMH of HTN.   Clinical Impression   Pt typically independent at baseline in ADL and functional transfers. Pt is at baseline able to perform transfers, in room mobility to meet bathroom needs, standing grooming at sink and access to LB for ADL at supervision level (only due to hospital setting) Fiance present throughout session. OT educated on BE FAST, and Pt/fiance verbalized understanding OT to sign off at this time.     Follow Up Recommendations  No OT follow up    Equipment Recommendations  None recommended by OT    Recommendations for Other Services       Precautions / Restrictions Precautions Precautions: Fall Precaution Comments: low fall risk Restrictions Weight Bearing Restrictions: No      Mobility Bed Mobility Overal bed mobility: Independent             General bed mobility comments: Pt able to perform all bed mobility safely and in appropriate time frame.    Transfers Overall transfer level: Independent Equipment used: None             General transfer comment: Pt able to come to stand quickly without LOB.    Balance Overall balance assessment: No apparent balance deficits (not formally assessed)                               Standardized Balance Assessment Standardized Balance Assessment : Dynamic Gait Index   Dynamic Gait Index Level Surface: Normal Change in Gait Speed: Normal Gait with Horizontal Head Turns: Normal Gait with Vertical Head Turns: Normal Gait and Pivot Turn: Normal Step Over Obstacle: Normal Step Around Obstacles: Normal Steps: Mild Impairment  (assumed, not tested as pt avoids stairs in community and no stairs at home) Total Score: 23     ADL either performed or assessed with clinical judgement   ADL Overall ADL's : At baseline                                       General ADL Comments: Able to perform transfers, toilet needs, standing grooming at sink, access BLE for ADL at supervision level (due to hospital setting)     Vision Patient Visual Report: No change from baseline Vision Assessment?: No apparent visual deficits     Perception     Praxis      Pertinent Vitals/Pain Pain Assessment: No/denies pain     Hand Dominance Right   Extremity/Trunk Assessment Upper Extremity Assessment Upper Extremity Assessment: Overall WFL for tasks assessed   Lower Extremity Assessment Lower Extremity Assessment: Overall WFL for tasks assessed LLE Deficits / Details: Slight weakness in L hip flexors and ankle dorsiflexors compared to R, but almost negligible LLE Sensation: WNL LLE Coordination: decreased gross motor (dysdiadochokinesia noted but no dysmetria noted)   Cervical / Trunk Assessment Cervical / Trunk Assessment: Normal   Communication Communication Communication: Expressive difficulties (improving dysarthria)   Cognition Arousal/Alertness: Awake/alert Behavior During Therapy: WFL for tasks assessed/performed Overall Cognitive Status: Within Functional Limits for tasks  assessed                                 General Comments: A&Ox4.   General Comments       Exercises     Shoulder Instructions      Home Living Family/patient expects to be discharged to:: Private residence Living Arrangements: Spouse/significant other Available Help at Discharge: Family Type of Home: House Home Access: Level entry     Home Layout: One level     Bathroom Shower/Tub: Teacher, early years/pre: [place]: None          Prior Functioning/Environment  Level of Independence: Independent        Comments: Pt drives and is retired.        OT Problem List: Impaired balance (sitting and/or standing)      OT Treatment/Interventions:      OT Goals(Current goals can be found in the care plan section) Acute Rehab OT Goals Patient Stated Goal: to go home OT Goal Formulation: With patient Time For Goal Achievement: 03/13/20 Potential to Achieve Goals: Good  OT Frequency:     Barriers to D/C:            Co-evaluation              AM-PAC OT "6 Clicks" Daily Activity     Outcome Measure Help from another person eating meals?: None Help from another person taking care of personal grooming?: None Help from another person toileting, which includes using toliet, bedpan, or urinal?: None Help from another person bathing (including washing, rinsing, drying)?: None Help from another person to put on and taking off regular upper body clothing?: None Help from another person to put on and taking off regular lower body clothing?: None 6 Click Score: 24   End of Session Nurse Communication: Mobility status  Activity Tolerance: Patient tolerated treatment well Patient left: in chair;with call bell/phone within reach  OT Visit Diagnosis: Other symptoms and signs involving the nervous system (I34.742)                Time: 5956-3875 OT Time Calculation (min): 14 min Charges:  OT General Charges $OT Visit: 1 Visit OT Evaluation $OT Eval Low Complexity: [place] OTR/L Acute Rehabilitation Services Pager: [phone] Office: [place] 02/28/2020, 5:57 PM

## 2020-03-09 ENCOUNTER — Telehealth: Payer: Self-pay | Admitting: Cardiology

## 2020-03-09 NOTE — Telephone Encounter (Signed)
*  STAT* If patient is at the pharmacy, call can be transferred to refill team.   1. Which medications need to be refilled? (please list name of each medication and dose if known)   amLODipine (NORVASC) 10 MG tablet    ramipril (ALTACE) 10 MG capsule   2. Which pharmacy/location (including street and city if local pharmacy) is medication to be sent to?[facility] - [place] ([place]), [place] - [address]  3. Do they need a 30 day or 90 day supply? 90 days  

## 2020-03-11 ENCOUNTER — Other Ambulatory Visit: Payer: Self-pay

## 2020-03-11 MED ORDER — AMLODIPINE BESYLATE 10 MG PO TABS: 10.0000 mg | ORAL_TABLET | Freq: Every day | ORAL | 3 refills | Status: DC

## 2020-03-11 MED ORDER — RAMIPRIL 10 MG PO CAPS: 10.0000 mg | ORAL_CAPSULE | Freq: Every day | ORAL | 3 refills | Status: DC

## 2020-03-14 NOTE — Progress Notes (Signed)
Cardiology Clinic Note   Patient Name: Curtis Saunders Date of Encounter: 03/15/2020  Primary Care Provider:  Patient, [name] Primary Cardiologist:  Minus Breeding, MD  Patient Profile    Curtis Saunders 65 year old male presents the clinic today for follow-up evaluation of his malignant hypertension, CVA, and ascending aortic aneurysm.    Past Medical History    Past Medical History:  Diagnosis Date  . Hypertension    Past Surgical History:  Procedure Laterality Date  . APPENDECTOMY    . EYE SURGERY    . HAND SURGERY    . KNEE SURGERY     x3, x1 R  . LIPOMA EXCISION Left 10/13/2019   Procedure: SHOULDER EXCISION LIPOMA AND ROTATOR CUFF REPAIR;  Surgeon: Tania Ade, MD;  Location: [place];  Service: Orthopedics;  Laterality: Left;  . TONSILLECTOMY      Allergies  Allergies  Allergen Reactions  . Bee Venom Anaphylaxis    History of Present Illness    The emergency department late January 2021 for chest pain.  He was seen by Dr. Percival Spanish 02/12/2020.  There was no objective evidence for ischemia.  His echocardiogram showed moderate LVH on echocardiogram.  His cardiac enzymes were negative.  CT of his chest demonstrated no acute abnormalities.  He underwent CT for his headaches at that time which demonstrated no acute abnormalities.  After being seen he was sent for coronary CTA and had no coronary calcium and normal coronary arteries.  He was noted to have ascending aortic aneurysm.  He was noted to have mildly elevated blood pressure.  He reported he was not exercising routinely and was not eating a good diet.  As he was leaving the office he had a near syncopal episode and became diaphoretic.  He did not lose consciousness but was minimally responsive.  His blood pressure was 130/70 with a heart rate in the 80s.  An EKG showed no changes.  He had no focal neurological deficits but remained sluggish with his response to questions.  He was oriented  to person place and time.  Blood sugar was unremarkable.  EMS was contacted and he was transported to the emergency room.  He was admitted to the hospital on 02/27/2020 and discharged on 02/29/2020. He presented with a history of hypertension but no other medical history. He also reported recent mild symptomatic Covid infection. In the emergency room he was noted to have slurred speech, right-sided facial droop. He was found to have posterior limb of the left internal capsule acute infarct on his MRI. His only deficit was noted to be slurred speech and facial droop. His CTA of his head and neck showed no large vessel occlusion. MRI of the brain showed 9 mm acute infarct. Echocardiogram showed normal EF severe left ventricular concentric hypertrophy with no embolus. He was not on antiplatelet therapy prior to admission. He was discharged on 81 mg aspirin, Plavix 75 mg daily blood pressure was fairly stable on amlodipine and ramipril. He was seen and evaluated by physical therapy and Occupational Therapy he did not make further recommendations for therapy. He was discharged in stable condition. He was asked to follow-up with cardiology and neurology.  He presents the clinic today for follow-up evaluation states he feels well today.  His wife presents with him and reports that every time he is in the doctor's office he has elevated blood pressure.  He does not routinely check his blood pressure at home.  He has  no deficits from his recent CVA.  I will stop his ramipril and start him on valsartan 80 mg daily, give him the salty 6 diet sheet, have him increase his physical activity, give a blood pressure cuff, and have him follow-up in 1 month.  We will have him return for BMP in 1 week.  Today he denies chest pain, shortness of breath, lower extremity edema, fatigue, palpitations, melena, hematuria, hemoptysis, diaphoresis, weakness, presyncope, syncope, orthopnea, and PND.   Home Medications    Prior to  Admission medications   Medication Sig Start Date End Date Taking? Authorizing Provider  amLODipine (NORVASC) 10 MG tablet Take 1 tablet (10 mg total) by mouth daily. 03/11/20   Minus Breeding, MD  aspirin EC 81 MG EC tablet Take 1 tablet (81 mg total) by mouth daily. Swallow whole. 02/28/20   Barb Merino, MD  atorvastatin (LIPITOR) 80 MG tablet Take 1 tablet (80 mg total) by mouth at bedtime. 02/28/20 05/28/20  Barb Merino, MD  clopidogrel (PLAVIX) 75 MG tablet Take 1 tablet (75 mg total) by mouth daily for 21 days. 02/28/20 03/20/20  Barb Merino, MD  ramipril (ALTACE) 10 MG capsule Take 1 capsule (10 mg total) by mouth daily. 03/11/20 06/09/20  Minus Breeding, MD  sildenafil (VIAGRA) 100 MG tablet TAKE 1 TABLET BY MOUTH ONCE DAILY AS NEEDED FOR ERECTILE DYSFUNCTION Patient not taking: No sig reported 06/05/18 08/08/19  Minus Breeding, MD    Family History    Family History  Problem Relation Age of Onset  . Heart attack Mother 65  . COPD Mother   . Emphysema Mother   . Hypertension Sister    He indicated that his mother is deceased. He indicated that his father is deceased. He indicated that his sister is alive.  Social History    Social History   Socioeconomic History  . Marital status: Single    Spouse name: Not on file  . Number of children: Not on file  . Years of education: Not on file  . Highest education level: Not on file  Occupational History  . Not on file  Tobacco Use  . Smoking status: Never Smoker  . Smokeless tobacco: Never Used  Vaping Use  . Vaping Use: Never used  Substance and Sexual Activity  . Alcohol use: Never    Comment: socially  . Drug use: No  . Sexual activity: Yes  Other Topics Concern  . 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     Social Determinants of Health   Financial Resource Strain: Not on file  Food Insecurity: Not on file  Transportation Needs: Not on file  Physical Activity: Not on file   Stress: Not on file  Social Connections: Not on file  Intimate Partner Violence: Not on file     Review of Systems    General:  No chills, fever, night sweats or weight changes.  Cardiovascular:  No chest pain, dyspnea on exertion, edema, orthopnea, palpitations, paroxysmal nocturnal dyspnea. Dermatological: No rash, lesions/masses Respiratory: No cough, dyspnea Urologic: No hematuria, dysuria Abdominal:   No nausea, vomiting, diarrhea, bright red blood per rectum, melena, or hematemesis Neurologic:  No visual changes, wkns, changes in mental status. All other systems reviewed and are otherwise negative except as noted above.  Physical Exam    VS:  BP 138/86   Pulse 64   Ht 6\' 1"  (1.854 m)   Wt 213 lb (96.6 kg)  SpO2 98%   BMI 28.10 kg/m  , BMI Body mass index is 28.1 kg/m. GEN: Well nourished, well developed, in no acute distress. HEENT: normal. Neck: Supple, no JVD, carotid bruits, or masses. Cardiac: RRR, no murmurs, rubs, or gallops. No clubbing, cyanosis, edema.  Radials/DP/PT 2+ and equal bilaterally.  Respiratory:  Respirations regular and unlabored, clear to auscultation bilaterally. GI: Soft, nontender, nondistended, BS + x 4. MS: no deformity or atrophy. Skin: warm and dry, no rash. Neuro:  Strength and sensation are intact. Psych: Normal affect.  Accessory Clinical Findings    Recent Labs: 02/27/2020: ALT 34; BUN 14; Creatinine, Ser 1.20; Hemoglobin 12.2; Platelets 293; Potassium 3.7; Sodium 144   Recent Lipid Panel    Component Value Date/Time   CHOL 162 02/28/2020 0248   TRIG 65 02/28/2020 0248   HDL 41 02/28/2020 0248   CHOLHDL 4.0 02/28/2020 0248   VLDL 13 02/28/2020 0248   LDLCALC 108 (H) 02/28/2020 0248    ECG personally reviewed by me today-none today.  Coronary CTA 05/31/2018 FINDINGS: Cardiovascular: No significant pericardial effusion/thickening. Ectatic ascending thoracic aorta, 4.0 cm diameter, stable using similar measurement  technique. Normal caliber pulmonary arteries. No central pulmonary emboli.  Assessment & Plan   1.  Essential hypertension-BP today 138/86.  Well-controlled at home. Continue amlodipine, Start valsartan 80 mg daily  ramipril stop Heart healthy low-sodium diet-salty 6 given Increase physical activity as tolerated-goal 150 minutes of moderate physical activity per week Order BMP in 1 week  Left ventricular hypertrophy-noted on echocardiogram. Continue good blood pressure control with amlodipine and ramipril Heart healthy low-sodium diet Repeat echocardiogram 1/23  Aortic aneurysm-noted on CT and measured 4.0 cm.  Repeat CT scan scheduled for 5/22.  CVA-no apparent deficits.  02/27/2019 presented to the emergency department with slurred speech and right-sided facial droop. 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 Follows with PCP  Disposition: Follow-up with Dr. Percival Spanish or me in 1 months.  Jossie Ng. Marissa Weaver NP-C    03/15/2020, 3:32 PM [address] Office [phone] Fax [phone]  Notice: This dictation was prepared with Dragon dictation along with smaller phrase technology. Any transcriptional errors that result from this process are unintentional and may not be corrected upon review.  I spent 15 minutes examining this patient, reviewing medications, and using patient centered shared decision making involving her cardiac care.  Prior to her visit I spent greater than 20 minutes reviewing her past medical history,  medications, and prior cardiac tests.

## 2020-03-15 ENCOUNTER — Ambulatory Visit (INDEPENDENT_AMBULATORY_CARE_PROVIDER_SITE_OTHER): Payer: Medicaid Other | Admitting: General Practice

## 2020-03-15 ENCOUNTER — Other Ambulatory Visit: Payer: Self-pay

## 2020-03-15 ENCOUNTER — Encounter: Payer: Self-pay | Admitting: General Practice

## 2020-03-15 VITALS — BP 138/86 | HR 64 | Ht 73.0 in | Wt 213.0 lb

## 2020-03-15 DIAGNOSIS — Z79899 Other long term (current) drug therapy: Secondary | ICD-10-CM

## 2020-03-15 DIAGNOSIS — I63432 Cerebral infarction due to embolism of left posterior cerebral artery: Secondary | ICD-10-CM

## 2020-03-15 DIAGNOSIS — I517 Cardiomegaly: Secondary | ICD-10-CM

## 2020-03-15 DIAGNOSIS — I10 Essential (primary) hypertension: Secondary | ICD-10-CM

## 2020-03-15 DIAGNOSIS — N182 Chronic kidney disease, stage 2 (mild): Secondary | ICD-10-CM | POA: Diagnosis not present

## 2020-03-15 DIAGNOSIS — I712 Thoracic aortic aneurysm, without rupture: Secondary | ICD-10-CM | POA: Diagnosis not present

## 2020-03-15 DIAGNOSIS — I7121 Aneurysm of the ascending aorta, without rupture: Secondary | ICD-10-CM

## 2020-03-15 MED ORDER — VALSARTAN 80 MG PO TABS: 80.0000 mg | ORAL_TABLET | Freq: Every day | ORAL | 6 refills | Status: DC

## 2020-03-15 NOTE — Patient Instructions (Addendum)
Medication Instructions:  STOP RAMIPRIL  START VALSARTAN 80MG  DAILY *If you need a refill on your cardiac medications before your next appointment, please call your pharmacy*  Lab Work: BMET IN I WEEK-THIS IS NOT FASTING If you have labs (blood work) drawn today and your tests are completely normal, you will receive your results only by:  [place] (if you have MyChart) OR A paper copy in the mail.  If you have any lab test that is abnormal or we need to change your treatment, we will call you to review the results. You may go to any [facility] that is convenient for you however, we do have a lab in our office that is able to assist you. You DO NOT need an appointment for our lab. The lab is open 8:00am and closes at 4:00pm. Lunch 12:45 - 1:45pm.  Testing/Procedures: SCHEDULE CT  Special Instructions TAKE AND LOG YOUR BLOOD PRESSURE AT LEAST 1 HOUR AFTER TAKING MEDICATION  PLEASE READ AND FOLLOW SALTY 6-ATTACHED-1,800 mg daily  PLEASE INCREASE PHYSICAL ACTIVITY AS TOLERATED-GOAL IS 150 MIN/WEEKLY  Follow-Up: Your next appointment:  1 month(s) In Person with Minus Breeding, MD OR IF UNAVAILABLE [place], FNP-C  At [facility], you and your health needs are our priority.  As part of our continuing mission to provide you with exceptional heart care, we have created designated Provider Care Teams.  These Care Teams include your primary Cardiologist (physician) and Advanced Practice Providers (APPs -  Physician Assistants and Nurse Practitioners) who all work together to provide you with the care you need, when you need it.  We recommend signing up for the patient portal called "MyChart".  Sign up information is provided on this After Visit Summary.  MyChart is used to connect with patients for Virtual Visits (Telemedicine).  Patients are able to view lab/test results, encounter notes, upcoming appointments, etc.  Non-urgent messages can be sent to your provider as well.   To learn  more about what you can do with MyChart, go to [email or web address].

## 2020-03-19 ENCOUNTER — Other Ambulatory Visit: Payer: Self-pay

## 2020-03-19 ENCOUNTER — Emergency Department (HOSPITAL_COMMUNITY)
Admission: EM | Admit: 2020-03-19 | Discharge: 2020-03-19 | Disposition: A | Discharge status: Home / Self Care | Payer: Medicaid Other | Attending: Emergency Medicine | Admitting: Emergency Medicine

## 2020-03-19 ENCOUNTER — Encounter (HOSPITAL_COMMUNITY): Payer: Self-pay

## 2020-03-19 DIAGNOSIS — Z8673 Personal history of transient ischemic attack (TIA), and cerebral infarction without residual deficits: Secondary | ICD-10-CM | POA: Insufficient documentation

## 2020-03-19 DIAGNOSIS — Z79899 Other long term (current) drug therapy: Secondary | ICD-10-CM | POA: Diagnosis not present

## 2020-03-19 DIAGNOSIS — I10 Essential (primary) hypertension: Secondary | ICD-10-CM | POA: Diagnosis not present

## 2020-03-19 DIAGNOSIS — R55 Syncope and collapse: Secondary | ICD-10-CM | POA: Diagnosis not present

## 2020-03-19 DIAGNOSIS — Z7902 Long term (current) use of antithrombotics/antiplatelets: Secondary | ICD-10-CM | POA: Insufficient documentation

## 2020-03-19 DIAGNOSIS — Z7982 Long term (current) use of aspirin: Secondary | ICD-10-CM | POA: Diagnosis not present

## 2020-03-19 HISTORY — DX: Cerebral infarction, unspecified: I63.9

## 2020-03-19 LAB — BASIC METABOLIC PANEL
Anion gap: 10 (ref 5–15)
BUN: 19 mg/dL (ref 8–23)
CO2: 24 mmol/L (ref 22–32)
Calcium: 9.8 mg/dL (ref 8.9–10.3)
Chloride: 108 mmol/L (ref 98–111)
Creatinine, Ser: 1.45 mg/dL — ABNORMAL HIGH (ref 0.61–1.24)
GFR, Estimated: 54 mL/min — ABNORMAL LOW (ref >60)
Glucose, Bld: 123 mg/dL — ABNORMAL HIGH (ref 70–99)
Potassium: 3.8 mmol/L (ref 3.5–5.1)
Sodium: 142 mmol/L (ref 135–145)

## 2020-03-19 LAB — CBC
HCT: 41.6 % (ref 39.0–52.0)
Hemoglobin: 13.6 g/dL (ref 13.0–17.0)
MCH: 29.2 pg (ref 26.0–34.0)
MCHC: 32.7 g/dL (ref 30.0–36.0)
MCV: 89.5 fL (ref 80.0–100.0)
Platelets: 228 10*3/uL (ref 150–400)
RBC: 4.65 MIL/uL (ref 4.22–5.81)
RDW: 14.3 % (ref 11.5–15.5)
WBC: 9.9 10*3/uL (ref 4.0–10.5)
nRBC: 0 % (ref 0.0–0.2)

## 2020-03-19 LAB — CBG MONITORING, ED: Glucose-Capillary: 112 mg/dL — ABNORMAL HIGH (ref 70–99)

## 2020-03-19 MED ORDER — SODIUM CHLORIDE 0.9 % IV BOLUS: 500.0000 mL | Freq: Once | INTRAVENOUS | Status: DC

## 2020-03-19 NOTE — Discharge Instructions (Addendum)
Call your primary care doctor or specialist as discussed in the next 2-3 days.   Return immediately back to the ER if:  Your symptoms worsen within the next 12-24 hours. You develop new symptoms such as new fevers, persistent vomiting, new pain, shortness of breath, or new weakness or numbness, or if you have any other concerns.  

## 2020-03-19 NOTE — ED Provider Notes (Signed)
[place] DEPT Provider Note   CSN: [id number] Arrival date & time: 03/19/20  1338     History Chief Complaint  Patient presents with  . Near Syncope    Curtis Saunders is a 65 y.o. male.  Patient presents with lightheadedness and dizziness.  He states he was sitting down outside working on his car most of the morning.  He stood up and felt lightheaded and dizzy with the room spinning.  Symptoms lasted about 5 minutes and have since resolved.  Patient states he never lost consciousness and never fell.  Denies any headache or chest pain.  No fever no cough no vomiting or diarrhea.  Currently he states he feels much better.        Past Medical History:  Diagnosis Date  . Hypertension   . Stroke [facility])     Patient Active Problem List   Diagnosis Date Noted  . CVA (cerebral vascular accident) ([place]) 02/27/2020  . Ascending aortic aneurysm ([place]) 02/11/2020  . LVH (left ventricular hypertrophy) 03/06/2018  . Essential hypertension 03/06/2018  . HTN (hypertension), malignant 02/20/2018  . Chest pain 02/20/2018    Past Surgical History:  Procedure Laterality Date  . APPENDECTOMY    . EYE SURGERY    . HAND SURGERY    . KNEE SURGERY     x3, x1 R  . LIPOMA EXCISION Left 10/13/2019   Procedure: SHOULDER EXCISION LIPOMA AND ROTATOR CUFF REPAIR;  Surgeon: Tania Ade, MD;  Location: [place];  Service: Orthopedics;  Laterality: Left;  . TONSILLECTOMY         Family History  Problem Relation Age of Onset  . Heart attack Mother 16  . COPD Mother   . Emphysema Mother   . Hypertension Sister     Social History   Tobacco Use  . Smoking status: Never Smoker  . Smokeless tobacco: Never Used  Vaping Use  . Vaping Use: Never used  Substance Use Topics  . Alcohol use: Never  . Drug use: No    Home Medications Prior to Admission medications   Medication Sig Start Date End Date Taking? Authorizing Provider   amLODipine (NORVASC) 10 MG tablet Take 1 tablet (10 mg total) by mouth daily. 03/11/20   Minus Breeding, MD  aspirin EC 81 MG EC tablet Take 1 tablet (81 mg total) by mouth daily. Swallow whole. 02/28/20   Barb Merino, MD  atorvastatin (LIPITOR) 80 MG tablet Take 1 tablet (80 mg total) by mouth at bedtime. 02/28/20 05/28/20  Barb Merino, MD  clopidogrel (PLAVIX) 75 MG tablet Take 1 tablet (75 mg total) by mouth daily for 21 days. 02/28/20 03/20/20  Barb Merino, MD  valsartan (DIOVAN) 80 MG tablet Take 1 tablet (80 mg total) by mouth daily. 03/15/20   Deberah Pelton, NP  sildenafil (VIAGRA) 100 MG tablet TAKE 1 TABLET BY MOUTH ONCE DAILY AS NEEDED FOR ERECTILE DYSFUNCTION Patient not taking: No sig reported 06/05/18 08/08/19  Minus Breeding, MD    Allergies    Bee venom  Review of Systems   Review of Systems  Constitutional: Negative for fever.  HENT: Negative for ear pain and sore throat.   Eyes: Negative for pain.  Respiratory: Negative for cough.   Cardiovascular: Negative for chest pain.  Gastrointestinal: Negative for abdominal pain.  Genitourinary: Negative for flank pain.  Musculoskeletal: Negative for back pain.  Skin: Negative for color change and rash.  Neurological: Negative for syncope.  All other systems  reviewed and are negative.   Physical Exam Updated Vital Signs BP (!) 135/102   Pulse 66   Temp (!) 97.4 F (36.3 C) (Oral)   Resp 15   Ht 6\' 1"  (1.854 m)   Wt 96.6 kg   SpO2 96%   BMI 28.10 kg/m   Physical Exam Constitutional:      General: He is not in acute distress.    Appearance: He is well-developed.  HENT:     Head: Normocephalic.     Nose: Nose normal.  Eyes:     Extraocular Movements: Extraocular movements intact.  Cardiovascular:     Rate and Rhythm: Normal rate.  Pulmonary:     Effort: Pulmonary effort is normal.  Skin:    Coloration: Skin is not jaundiced.  Neurological:     Mental Status: He is alert. Mental status is at baseline.      ED Results / Procedures / Treatments   Labs (all labs ordered are listed, but only abnormal results are displayed) Labs Reviewed  BASIC METABOLIC PANEL - Abnormal; Notable for the following components:      Result Value   Glucose, Bld 123 (*)    Creatinine, Ser 1.45 (*)    GFR, Estimated 54 (*)    All other components within normal limits  CBG MONITORING, ED - Abnormal; Notable for the following components:   Glucose-Capillary 112 (*)    All other components within normal limits  CBC  URINALYSIS, ROUTINE W REFLEX MICROSCOPIC    EKG EKG Interpretation  Date/Time:  Friday March 19 2020 13:45:59 EST Ventricular Rate:  79 PR Interval:    QRS Duration: 104 QT Interval:  363 QTC Calculation: 417 R Axis:   75 Text Interpretation: Sinus rhythm Probable left atrial enlargement ST elevation suggests acute pericarditis 12 Lead; [place] Confirmed by Thamas Jaegers ([id number]) on 03/19/2020 3:03:36 PM   Radiology No results found.  Procedures Procedures   Medications Ordered in ED Medications  sodium chloride 0.9 % bolus 500 mL (500 mLs Intravenous Bolus 03/19/20 1418)    ED Course  I have reviewed the triage vital signs and the nursing notes.  Pertinent labs & imaging results that were available during my care of the patient were reviewed by me and considered in my medical decision making (see chart for details).    MDM Rules/Calculators/A&P                          Labs sent unremarkable.  Patient given some IV fluid hydration.  Continue to be symptomatic.  He has a normal neuro exam normal gait.  Without any symptoms.  Will advise rest and fluid hydration at home.  Advised me to return for worsening symptoms.  Otherwise advised rest at home and follow-up with his primary care doctor in 3 to 4 days.   Final Clinical Impression(s) / ED Diagnoses Final diagnoses:  Near syncope    Rx / DC Orders ED Discharge Orders    None       Luna Fuse, MD 03/19/20  [phone]

## 2020-03-19 NOTE — ED Triage Notes (Addendum)
Patient stated that he was outside working on his car approx 1 1/2 hours ago and felt likehe "was going to pass out." Patienat reports aq history of strokes x 2.  Patient also reports that he an increase in BP meds 4 days ago.

## 2020-03-25 ENCOUNTER — Ambulatory Visit (HOSPITAL_COMMUNITY): Payer: Medicaid Other

## 2020-04-01 ENCOUNTER — Ambulatory Visit (INDEPENDENT_AMBULATORY_CARE_PROVIDER_SITE_OTHER): Payer: Medicaid Other | Admitting: Adult Health

## 2020-04-01 ENCOUNTER — Ambulatory Visit (HOSPITAL_COMMUNITY): Payer: Medicaid Other

## 2020-04-01 ENCOUNTER — Encounter: Payer: Self-pay | Admitting: Adult Health

## 2020-04-01 VITALS — BP 156/95 | HR 70 | Ht 72.0 in | Wt 214.0 lb

## 2020-04-01 DIAGNOSIS — E785 Hyperlipidemia, unspecified: Secondary | ICD-10-CM | POA: Diagnosis not present

## 2020-04-01 DIAGNOSIS — I639 Cerebral infarction, unspecified: Secondary | ICD-10-CM | POA: Diagnosis not present

## 2020-04-01 NOTE — Patient Instructions (Signed)
Continue aspirin 81 mg daily for secondary stroke prevention  We will recheck your cholesterol levels and based on results, I will send in a refill for atorvastatin (lipitor) but dosage may be different from hospitalization which is recommended lifelong for secondary stroke prevention  Continue to follow up with PCP regarding cholesterol and blood pressure management  Maintain strict control of hypertension with blood pressure goal below 130/90 and cholesterol with LDL cholesterol (bad cholesterol) goal below 70 mg/dL.       Followup in the future with me in 4 months or call earlier if needed       Thank you for coming to see [place] at [facility]. I hope we have been able to provide you high quality care today.  You may receive a patient satisfaction survey over the next few weeks. We would appreciate your feedback and comments so that we may continue to improve ourselves and the health of our patients.

## 2020-04-01 NOTE — Progress Notes (Signed)
[facility] [address] [place]. [place] [address] [phone]       HOSPITAL FOLLOW UP NOTE  [name] Date of Birth:  1955/06/24 Medical Record Number:  [id number]   Reason for Referral:  hospital stroke follow up    SUBJECTIVE:   CHIEF COMPLAINT:  Chief Complaint  Patient presents with  . Follow-up    RM 14 alone PT is well, no complaints     HPI:   [name] is a 65 y.o. male with history of HTN  presented on 02/27/2020 with slurred speech and right facial droop.  Personally reviewed hospitalization pertinent progress notes, lab work and imaging with summary provided.  Evaluated by Dr. 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  Recommended cardiology consult for further evaluation.  Recommended DAPT for 3 weeks and aspirin alone.  LDL 108 -initiate atorvastatin 40 mg daily.  A1c 6.2.  Other stroke risk factors include prior stroke on imaging, family history of stroke and Covid 19 infection 02/12/2020.  Residual deficits mild dysarthria and right facial droop.  Evaluated by therapies and recommended outpatient SLP and discharged home in stable condition.  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 This infarct may also involve the adjacent left  thalamus and lentiform nucleus.Two chronic small-vessel infarcts within the left corona radiata/basal ganglia, new as compared to the head CT of 02/20/2019. Redemonstrated chronic lacunar infarct within the right corona radiata.Background mild cerebral white matter chronic small vessel ischemic disease.  CTA head & neck No large vessel occlusion, significant stenosis, or aneurysm in the head and neck. Small left upper lobe pulmonary nodules measuring up to  4 mm. No follow-up needed if patient is low-risk, Diffusely enlarged thyroid with a possible 1.5 cm nodule in the left lobe.  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  EF 65 to 70%  LDL 108  HgbA1c 6.2  VTE prophylaxis -none  None prior to admission, now on aspirin 325 mg daily. Can change to aspirin 81 mg and Plavix 75  for 3 weeks and aspirin alone  Therapy recommendations:  OP SLP  Disposition:  Home  Today, 04/01/2020, Curtis Saunders is being seen for hospital follow-up unaccompanied  Reports complete recovery since discharge He has returned back to all prior activities without difficulty Denies new stroke/TIA symptoms  Completed 3 weeks DAPT and remains on aspirin alone -denies side effects Not currently on atorvastatin -thought he only needed to take for 30 days but denies side effects while taking consistently Blood pressure today 156/95 -recently switched from rampiril to valsartan but apparently blood pressure became elevated with SBP 190-200s therefore he self discontinued valsartan and restarted taking Rampiril -blood pressures have been more consistently 150s/80s  No concerns at this time     ROS:   14 system review of systems performed and negative with exception of no complaints  PMH:  Past Medical History:  Diagnosis Date  . Hypertension   . Stroke [facility])     PSH:  Past Surgical History:  Procedure Laterality Date  . APPENDECTOMY    . EYE SURGERY    . HAND SURGERY    . KNEE SURGERY     x3, x1 R  . LIPOMA EXCISION Left 10/13/2019  Procedure: SHOULDER EXCISION LIPOMA AND ROTATOR CUFF REPAIR;  Surgeon: Tania Ade, MD;  Location: [place];  Service: Orthopedics;  Laterality: Left;  . TONSILLECTOMY      Social History:  Social History   Socioeconomic History  . Marital status: Single    Spouse name: Not on file  . Number of children: Not on file  . Years of education: Not on file  . Highest  education level: Not on file  Occupational History  . Not on file  Tobacco Use  . Smoking status: Never Smoker  . Smokeless tobacco: Never Used  Vaping Use  . Vaping Use: Never used  Substance and Sexual Activity  . Alcohol use: Never  . Drug use: No  . Sexual activity: Yes  Other Topics Concern  . 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     Social Determinants of Health   Financial Resource Strain: Not on file  Food Insecurity: Not on file  Transportation Needs: Not on file  Physical Activity: Not on file  Stress: Not on file  Social Connections: Not on file  Intimate Partner Violence: Not on file    Family History:  Family History  Problem Relation Age of Onset  . Heart attack Mother 33  . COPD Mother   . Emphysema Mother   . Hypertension Sister     Medications:   Current Outpatient Medications on File Prior to Visit  Medication Sig Dispense Refill  . amLODipine (NORVASC) 10 MG tablet Take 1 tablet (10 mg total) by mouth daily. 90 tablet 3  . aspirin EC 81 MG EC tablet Take 1 tablet (81 mg total) by mouth daily. Swallow whole. 30 tablet 11  . ramipril (ALTACE) 10 MG capsule Take 10 mg by mouth daily.    . [DISCONTINUED] sildenafil (VIAGRA) 100 MG tablet TAKE 1 TABLET BY MOUTH ONCE DAILY AS NEEDED FOR ERECTILE DYSFUNCTION (Patient not taking: No sig reported) 10 tablet 0   No current facility-administered medications on file prior to visit.    Allergies:   Allergies  Allergen Reactions  . Bee Venom Anaphylaxis      OBJECTIVE:  Physical Exam  Vitals:   04/01/20 0959  BP: (!) 156/95  Pulse: 70  Weight: 214 lb (97.1 kg)  Height: 6' (1.829 m)   Body mass index is 29.02 kg/m. No exam data present  General: well developed, well nourished,  pleasant middle-aged African-American male, seated, in no evident distress Head: head normocephalic and atraumatic.   Neck: supple with no carotid or supraclavicular  bruits Cardiovascular: regular rate and rhythm, no murmurs Musculoskeletal: no deformity Skin:  no rash/petichiae Vascular:  Normal pulses all extremities   Neurologic Exam Mental Status: Awake and fully alert.   Mildly slurred speech possibly with poor denture as at baseline per patient.  Oriented to place and time. Recent and remote memory intact. Attention span, concentration and fund of knowledge appropriate. Mood and affect appropriate.  Cranial Nerves: Fundoscopic exam reveals sharp disc margins. Pupils equal, briskly reactive to light. Extraocular movements full without nystagmus. Visual fields full to confrontation. Hearing intact. Facial sensation intact. Face, tongue, palate moves normally and symmetrically.  Motor: Normal bulk and tone. Normal strength in all tested extremity muscles Sensory.: intact to touch , pinprick , position and vibratory sensation.  Coordination: Rapid alternating movements normal in all extremities. Finger-to-nose and heel-to-shin performed accurately bilaterally. Gait and Station: Arises from chair without difficulty. Stance  is normal. Gait demonstrates normal stride length and balance without use of assistive device. Tandem walk and heel toe with moderate difficulty.  Reflexes: 1+ and symmetric. Toes downgoing.     NIHSS  0-1 (d/t ?Dysarthria but possibly in setting of poor denture) Modified Rankin  1      ASSESSMENT: Curtis Saunders is a 65 y.o. 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 Vascular risk factors include prior stroke on imaging, HTN, HLD, prediabetes and cardiomyopathy concerning for amyloidosis.      PLAN:  1. L PLIC stroke :  a. Recovered well without residual deficits.   b. Continue aspirin 81 mg daily for secondary stroke prevention.  Obtain cholesterol levels today with plans on restarting statin based  on levels c. Discussed secondary stroke prevention measures and importance of close PCP follow up for aggressive stroke risk factor management  2. HTN: BP goal <130/90.  Elevated on amlodipine and ramipril per PCP. Reports valsartan elevated BP -advised to further discuss with PCP 3. HLD: LDL goal <70. Recent LDL 108 - will recheck lipid panel and restart statin based on level (reports drastic dietary changes and he does not feel as though high-dose intensity statin indicated).  4. Cardiomyopathy: Routine follow-up with cardiology    Follow up in 4 months or call earlier if needed   CC:  [place] provider: Dr. Leonie Man PCP: Patient, [name]    I spent 45 minutes of face-to-face and non-face-to-face time with patient.  This included previsit chart review including recent hospitalization pertinent progress notes, lab work and imaging, lab review, study review, order entry, electronic health record documentation, patient education regarding recent stroke and etiology, importance of managing stroke risk factors and answered all other questions to patient satisfaction   Frann Rider, [facility]  [facility] [address] [place], [place] [address]  Phone [phone] Fax [phone] Note: This document was prepared with digital dictation and possible smart phrase technology. Any transcriptional errors that result from this process are unintentional.

## 2020-04-02 LAB — LIPID PANEL
Chol/HDL Ratio: 3 ratio (ref 0.0–5.0)
Cholesterol, Total: 173 mg/dL (ref 100–199)
HDL: 58 mg/dL (ref >39)
LDL Chol Calc (NIH): 100 mg/dL — ABNORMAL HIGH (ref 0–99)
Triglycerides: 81 mg/dL (ref 0–149)
VLDL Cholesterol Cal: 15 mg/dL (ref 5–40)

## 2020-04-05 ENCOUNTER — Other Ambulatory Visit: Payer: Self-pay | Admitting: Adult Health

## 2020-04-05 MED ORDER — ATORVASTATIN CALCIUM 40 MG PO TABS: 40.0000 mg | ORAL_TABLET | Freq: Every day | ORAL | 3 refills | Status: DC

## 2020-04-11 NOTE — Progress Notes (Signed)
I agree with the above plan 

## 2020-04-12 DIAGNOSIS — N183 Chronic kidney disease, stage 3 unspecified: Secondary | ICD-10-CM | POA: Insufficient documentation

## 2020-04-12 DIAGNOSIS — N182 Chronic kidney disease, stage 2 (mild): Secondary | ICD-10-CM | POA: Insufficient documentation

## 2020-04-12 NOTE — Progress Notes (Signed)
Cardiology Office Note   Date:  04/13/2020   ID:  Helix, Lafontaine 1955/07/30, MRN [id number]  PCP:  Patient, [name]  Cardiologist:   Minus Breeding, MD Referring:  ED provider  Chief Complaint  Patient presents with  . LVH      History of Present Illness: Curtis Saunders is a 65 y.o. male who was referred by ED for evaluation of chest pain.  He was in the ED in late Jan 2021 for chest pain.  I reviewed these records for this visit.    There was no evidence objective evidence of ischemia.   The patient had moderate LVH on echo.  He was noted there to have negative cardiac enzymes.  CT of the chest demonstrated no acute abnormalities.  Apparently because he had headaches he also had a CT of his head which demonstrated no acute abnormalities.  He was treated with morphine, ketorolac and nitroglycerin.    After seeing me I sent him for a coronary CTA and he had no coronary calcium and NL coronaries.  He does have an ascending aortic aneurysm.    At the last visit he had a near syncopal episode while checking out and he had to be transported to the hospital.   He tested COVID positive and had likely a vagal episode.    He was hospitalized a few weeks later with acute CVA.  There was an MRI with posterior limb internal capsule acute infarct.  Echo demonstrated severe LVH.  He was treated with ASA and Plavix.     Since he was last seen in the hospital he actually says he felt great.  Of note he was taking valsartan but he said his blood pressure systolics were in the 409W and he switched to Altace which he been taking before his blood pressure is much better controlled.  He denies any shortness of breath, PND or orthopnea.  Has had no new palpitations, presyncope or syncope.  He denies any chest pressure, neck or arm discomfort.   Past Medical History:  Diagnosis Date  . Hypertension   . Stroke [facility])     Past Surgical History:  Procedure Laterality Date  . APPENDECTOMY    .  EYE SURGERY    . HAND SURGERY    . KNEE SURGERY     x3, x1 R  . LIPOMA EXCISION Left 10/13/2019   Procedure: SHOULDER EXCISION LIPOMA AND ROTATOR CUFF REPAIR;  Surgeon: Tania Ade, MD;  Location: [place];  Service: Orthopedics;  Laterality: Left;  . TONSILLECTOMY       Current Outpatient Medications  Medication Sig Dispense Refill  . amLODipine (NORVASC) 10 MG tablet Take 1 tablet (10 mg total) by mouth daily. 90 tablet 3  . aspirin EC 81 MG EC tablet Take 1 tablet (81 mg total) by mouth daily. Swallow whole. 30 tablet 11  . atorvastatin (LIPITOR) 40 MG tablet Take 1 tablet (40 mg total) by mouth daily. 90 tablet 3  . ramipril (ALTACE) 10 MG capsule Take 10 mg by mouth daily.     No current facility-administered medications for this visit.    Allergies:   Bee venom   ROS:  Please see the history of present illness.   Otherwise, review of systems are positive for none.   All other systems are reviewed and negative.    PHYSICAL EXAM: VS:  BP 140/86   Pulse 76   Ht 6' (1.829 m)  Wt 210 lb 6.4 oz (95.4 kg)   SpO2 97%   BMI 28.54 kg/m  , BMI Body mass index is 28.54 kg/m.  GENERAL:  Well appearing NECK:  No jugular venous distention, waveform within normal limits, carotid upstroke brisk and symmetric, no bruits, no thyromegaly LUNGS:  Clear to auscultation bilaterally CHEST:  Unremarkable HEART:  PMI not displaced or sustained,S1 and S2 within normal limits, no S3, no S4, no clicks, no rubs, no murmurs ABD:  Flat, positive bowel sounds normal in frequency in pitch, no bruits, no rebound, no guarding, no midline pulsatile mass, no hepatomegaly, no splenomegaly EXT:  2 plus pulses throughout, no edema, no cyanosis no clubbing   EKG:  EKG is not ordered today.   Recent Labs: 02/27/2020: ALT 34 03/19/2020: BUN 19; Creatinine, Ser 1.45; Hemoglobin 13.6; Platelets 228; Potassium 3.8; Sodium 142    Lipid Panel    Component Value Date/Time   CHOL 173  04/01/2020 1034   TRIG 81 04/01/2020 1034   HDL 58 04/01/2020 1034   CHOLHDL 3.0 04/01/2020 1034   CHOLHDL 4.0 02/28/2020 0248   VLDL 13 02/28/2020 0248   LDLCALC 100 (H) 04/01/2020 1034      Wt Readings from Last 3 Encounters:  04/13/20 210 lb 6.4 oz (95.4 kg)  04/01/20 214 lb (97.1 kg)  03/19/20 213 lb (96.6 kg)      Other studies Reviewed: Additional studies/ records that were reviewed today include: Hospital records Review of the above records demonstrates:  Please see elsewhere in the note.     ASSESSMENT AND PLAN:   HTN: His blood pressure is controlled and continue current meds. ation.   LVH:    I will order an MRI for amyloid.  Could be this or his HTN but need to exclude amyloid.   AORTIC ANEURYSM:  This was 4.0 cm on CT.  He is needs follow up with the MRI.   CKD II:   I will check a BMET today.    Current medicines are reviewed at length with the patient today.  The patient does not have concerns regarding medicines.  The following changes have been made:  None  Labs/ tests ordered today include:  None  Orders Placed This Encounter  Procedures  . MR CARDIAC MORPHOLOGY WO CONTRAST  . Basic metabolic panel  . CBC     Disposition:   FU with me or APP in 3 months.    Signed, Minus Breeding, MD  04/13/2020 4:30 PM    [place]

## 2020-04-13 ENCOUNTER — Encounter: Payer: Self-pay | Admitting: Cardiology

## 2020-04-13 ENCOUNTER — Ambulatory Visit: Payer: Medicaid Other | Admitting: Cardiology

## 2020-04-13 ENCOUNTER — Other Ambulatory Visit: Payer: Self-pay

## 2020-04-13 ENCOUNTER — Ambulatory Visit (INDEPENDENT_AMBULATORY_CARE_PROVIDER_SITE_OTHER): Payer: Medicaid Other | Admitting: Cardiology

## 2020-04-13 VITALS — BP 140/86 | HR 76 | Ht 72.0 in | Wt 210.4 lb

## 2020-04-13 DIAGNOSIS — I10 Essential (primary) hypertension: Secondary | ICD-10-CM

## 2020-04-13 DIAGNOSIS — I712 Thoracic aortic aneurysm, without rupture, unspecified: Secondary | ICD-10-CM

## 2020-04-13 DIAGNOSIS — N182 Chronic kidney disease, stage 2 (mild): Secondary | ICD-10-CM | POA: Diagnosis not present

## 2020-04-13 DIAGNOSIS — I517 Cardiomegaly: Secondary | ICD-10-CM | POA: Diagnosis not present

## 2020-04-13 DIAGNOSIS — E859 Amyloidosis, unspecified: Secondary | ICD-10-CM

## 2020-04-13 DIAGNOSIS — R072 Precordial pain: Secondary | ICD-10-CM

## 2020-04-13 NOTE — Patient Instructions (Signed)
Medication Instructions:  The current medical regimen is effective;  continue present plan and medications as directed. Please refer to the Current Medication list given to you today.  *If you need a refill on your cardiac medications before your next appointment, please call your pharmacy*  Lab Work: BMET AND CBC TODAY If you have labs (blood work) drawn today and your tests are completely normal, you will receive your results only by:  [facility] (if you have MyChart) OR A paper copy in the mail.  If you have any lab test that is abnormal or we need to change your treatment, we will call you to review the results. You may go to any [facility] that is convenient for you however, we do have a lab in our office that is able to assist you. You DO NOT need an appointment for our lab. The lab is open 8:00am and closes at 4:00pm. Lunch 12:45 - 1:45pm.  Testing/Procedures: Your physician has requested that you have an [place]: Your next appointment:  3 month(s) In Person with You will see one of the following Advanced Practice Providers on your designated Care Team:  Rosaria Ferries, PA-C  Jory Sims, DNP, ANP  At [facility], you and your health needs are our priority.  As part of our continuing mission to provide you with exceptional heart care, we have created designated Provider Care Teams.  These Care Teams include your primary Cardiologist (physician) and Advanced Practice Providers (APPs -  Physician Assistants and Nurse Practitioners) who all work together to provide you with the care you need, when you need it.  We recommend signing up for the patient portal called "MyChart".  Sign up information is provided on this After Visit Summary.  MyChart is used to connect with patients for Virtual Visits (Telemedicine).  Patients are able to view lab/test results, encounter notes, upcoming appointments, etc.  Non-urgent messages can be sent to your provider as well.   To  learn more about what you can do with MyChart, go to [email or web address].

## 2020-04-14 LAB — BASIC METABOLIC PANEL
BUN/Creatinine Ratio: 15 (ref 10–24)
BUN: 17 mg/dL (ref 8–27)
CO2: 21 mmol/L (ref 20–29)
Calcium: 9.9 mg/dL (ref 8.6–10.2)
Chloride: 104 mmol/L (ref 96–106)
Creatinine, Ser: 1.17 mg/dL (ref 0.76–1.27)
Glucose: 89 mg/dL (ref 65–99)
Potassium: 4.2 mmol/L (ref 3.5–5.2)
Sodium: 142 mmol/L (ref 134–144)
eGFR: 70 mL/min/{1.73_m2} (ref >59)

## 2020-04-14 LAB — CBC
Hematocrit: 39.1 % (ref 37.5–51.0)
Hemoglobin: 13 g/dL (ref 13.0–17.7)
MCH: 30.2 pg (ref 26.6–33.0)
MCHC: 33.2 g/dL (ref 31.5–35.7)
MCV: 91 fL (ref 79–97)
Platelets: 248 10*3/uL (ref 150–450)
RBC: 4.31 x10E6/uL (ref 4.14–5.80)
RDW: 14 % (ref 11.6–15.4)
WBC: 7.5 10*3/uL (ref 3.4–10.8)

## 2020-04-15 ENCOUNTER — Encounter: Payer: Self-pay | Admitting: Cardiology

## 2020-04-15 ENCOUNTER — Telehealth: Payer: Self-pay | Admitting: Cardiology

## 2020-04-15 NOTE — Telephone Encounter (Signed)
Spoke with patient regarding the Thursday 05/27/20 at 12:00pm---arrival time is 11:30 am --1st floor admissions office for check in.  Will mail information to patient and he voiced his understanding.

## 2020-05-12 ENCOUNTER — Telehealth: Payer: Self-pay | Admitting: Cardiology

## 2020-05-12 NOTE — Telephone Encounter (Signed)
   *  STAT* If patient is at the pharmacy, call can be transferred to refill team.   1. Which medications need to be refilled? (please list name of each medication and dose if known)   amLODipine (NORVASC) 10 MG tablet    ramipril (ALTACE) 10 MG capsule   2. Which pharmacy/location (including street and city if local pharmacy) is medication to be sent to?[facility] ([place]), [place] - [address]  3. Do they need a 30 day or 90 day supply? 90 days

## 2020-05-13 MED ORDER — AMLODIPINE BESYLATE 10 MG PO TABS: 10.0000 mg | ORAL_TABLET | Freq: Every day | ORAL | 3 refills | Status: DC

## 2020-05-13 MED ORDER — RAMIPRIL 10 MG PO CAPS: 10.0000 mg | ORAL_CAPSULE | Freq: Every day | ORAL | 3 refills | Status: DC

## 2020-05-13 NOTE — Addendum Note (Signed)
Addended by: Wonda Horner on: 05/13/2020 08:44 AM   Modules accepted: Orders

## 2020-05-13 NOTE — Telephone Encounter (Signed)
Patient is following up regarding refill request. He states he only has a few tablets remaining and he would like to ensure that the medication is sent in before he runs out.

## 2020-05-25 ENCOUNTER — Other Ambulatory Visit (HOSPITAL_COMMUNITY): Payer: Self-pay | Admitting: Cardiology

## 2020-05-25 ENCOUNTER — Other Ambulatory Visit: Payer: Self-pay | Admitting: Cardiology

## 2020-05-25 DIAGNOSIS — I712 Thoracic aortic aneurysm, without rupture: Secondary | ICD-10-CM

## 2020-05-25 DIAGNOSIS — I7121 Aneurysm of the ascending aorta, without rupture: Secondary | ICD-10-CM

## 2020-05-26 ENCOUNTER — Telehealth: Payer: Self-pay | Admitting: Cardiology

## 2020-05-26 ENCOUNTER — Telehealth (HOSPITAL_COMMUNITY): Payer: Self-pay | Admitting: Emergency Medicine

## 2020-05-26 NOTE — Telephone Encounter (Signed)
05/26/20 @ 2:37 pm left message telling patient we are cancelling this appointment until the insurance authorization is finalized---requested patient call with questions or concerns

## 2020-05-27 ENCOUNTER — Ambulatory Visit (HOSPITAL_COMMUNITY): Admission: RE | Admit: 2020-05-27 | Payer: Medicaid Other | Source: Ambulatory Visit

## 2020-05-27 NOTE — Progress Notes (Addendum)
Cardiology Office Note   Date:  10/18/2020   ID:  Curtis Saunders, DOB 04-Oct-1955, MRN [id number]  PCP:  Pcp, No  Cardiologist:   Minus Breeding, MD Referring:  ED provider  Chief Complaint  Patient presents with   Chest Pain      History of Present Illness: Curtis Saunders is a 65 y.o. male who was referred by ED for evaluation of chest pain.  He was in the ED in late Jan 2021 for chest pain.    There was no evidence objective evidence of ischemia.   The patient had moderate LVH on echo.  He was noted there to have negative cardiac enzymes.  CT of the chest demonstrated no acute abnormalities.  Apparently because he had headaches he also had a CT of his head which demonstrated no acute abnormalities.  He was treated with morphine, ketorolac and nitroglycerin.    After seeing me I sent him for a coronary CTA and he had no coronary calcium and NL coronaries.  He does have an ascending aortic aneurysm.   In Feb 2022 he was hospitalized with acute CVA.  There was an MRI with posterior limb internal capsule acute infarct.  Echo demonstrated severe LVH.  He was treated with ASA and Plavix.     Since he was last seen he has done well.  His blood pressure is better controlled.  He takes it at home it is in the 140s typically but he does not remember the diastolics.  He walks every day for exercise. The patient denies any new symptoms such as chest discomfort, neck or arm discomfort. There has been no new shortness of breath, PN x-ray D or orthopnea. There have been no reported palpitations, presyncope or syncope.    Past Medical History:  Diagnosis Date   Hypertension    Stroke [facility])     Past Surgical History:  Procedure Laterality Date   APPENDECTOMY     EYE SURGERY     HAND SURGERY     KNEE SURGERY     x3, x1 R   LIPOMA EXCISION Left 10/13/2019   Procedure: SHOULDER EXCISION LIPOMA AND ROTATOR CUFF REPAIR;  Surgeon: Tania Ade, MD;  Location: [facility];   Service: Orthopedics;  Laterality: Left;   TONSILLECTOMY       Current Outpatient Medications  Medication Sig Dispense Refill   aspirin EC 81 MG EC tablet Take 1 tablet (81 mg total) by mouth daily. Swallow whole. 30 tablet 11   amLODipine (NORVASC) 10 MG tablet Take 1 tablet (10 mg total) by mouth daily. 90 tablet 3   chlorthalidone (HYGROTON) 25 MG tablet Take 1 tablet (25 mg total) by mouth daily. 90 tablet 3   ramipril (ALTACE) 10 MG capsule Take 1 capsule (10 mg total) by mouth daily. 90 capsule 3   No current facility-administered medications for this visit.    Allergies:   Bee venom   ROS:  Please see the history of present illness.   Otherwise, review of systems are positive for none.   All other systems are reviewed and negative.    PHYSICAL EXAM: VS:  BP (!) 148/98   Pulse 74   Ht 6' (1.829 m)   Wt 209 lb 12.8 oz (95.2 kg)   SpO2 99%   BMI 28.45 kg/m  , BMI Body mass index is 28.45 kg/m.  GENERAL:  Well appearing NECK:  No jugular venous distention, waveform within normal limits, carotid upstroke  brisk and symmetric, no bruits, no thyromegaly LUNGS:  Clear to auscultation bilaterally CHEST:  Unremarkable HEART:  PMI not displaced or sustained,S1 and S2 within normal limits, no S3, no S4, no clicks, no rubs, no murmurs ABD:  Flat, positive bowel sounds normal in frequency in pitch, no bruits, no rebound, no guarding, no midline pulsatile mass, no hepatomegaly, no splenomegaly EXT:  2 plus pulses throughout, no edema, no cyanosis no clubbing   EKG:  EKG is not ordered today.   Recent Labs: 02/27/2020: ALT 34 04/13/2020: Hemoglobin 13.0; Platelets 248 08/03/2020: TSH 1.330 10/18/2020: BUN 22; Creatinine, Ser 1.52; Potassium 4.3; Sodium 143    Lipid Panel    Component Value Date/Time   CHOL 173 04/01/2020 1034   TRIG 81 04/01/2020 1034   HDL 58 04/01/2020 1034   CHOLHDL 3.0 04/01/2020 1034   CHOLHDL 4.0 02/28/2020 0248   VLDL 13 02/28/2020 0248   LDLCALC 100  (H) 04/01/2020 1034      Wt Readings from Last 3 Encounters:  10/18/20 217 lb 6.4 oz (98.6 kg)  09/06/20 215 lb 12.8 oz (97.9 kg)  08/03/20 214 lb (97.1 kg)      Other studies Reviewed: Additional studies/ records that were reviewed today include: Xarelto  Okay and that is really because my kids show me stuff all the time in a month  I can check this Patient only stop all blood thinners with works really really Review of the above records demonstrates:  Please see elsewhere in the note.     ASSESSMENT AND PLAN:   HTN:   His blood pressure is quite at target.  He is been intolerant to multiple medications and I looked through these.  He has never been on clonidine.  I think he do well with a Catapres patch and I am going to prescribe a #1 in addition to the meds as listed.    LVH:    He has an MRI pending.  We are going to exclude amyloid but I suspect this is related to his hypertension.  AORTIC ANEURYSM:  This was 4.0 cm on CT.  He is needs follow up with the MRI.  We reviewed this today.  CKD II:   1.17.  No change in therapy.  Current medicines are reviewed at length with the patient today.  The patient does not have concerns regarding medicines.  The following changes have been made:  None  Labs/ tests ordered today include:  None  No orders of the defined types were placed in this encounter.    Disposition:   FU with me or APP in 3 months.    Signed, Minus Breeding, MD  10/18/2020 6:07 PM    [place]

## 2020-05-28 ENCOUNTER — Encounter: Payer: Self-pay | Admitting: Cardiology

## 2020-05-28 ENCOUNTER — Ambulatory Visit (INDEPENDENT_AMBULATORY_CARE_PROVIDER_SITE_OTHER): Payer: Medicaid Other | Admitting: Cardiology

## 2020-05-28 ENCOUNTER — Other Ambulatory Visit: Payer: Self-pay

## 2020-05-28 VITALS — BP 148/98 | HR 74 | Ht 72.0 in | Wt 209.8 lb

## 2020-05-28 DIAGNOSIS — I712 Thoracic aortic aneurysm, without rupture: Secondary | ICD-10-CM

## 2020-05-28 DIAGNOSIS — N182 Chronic kidney disease, stage 2 (mild): Secondary | ICD-10-CM | POA: Diagnosis not present

## 2020-05-28 DIAGNOSIS — I10 Essential (primary) hypertension: Secondary | ICD-10-CM | POA: Diagnosis not present

## 2020-05-28 DIAGNOSIS — I7121 Aneurysm of the ascending aorta, without rupture: Secondary | ICD-10-CM

## 2020-05-28 MED ORDER — CLONIDINE 0.1 MG/24HR TD PTWK: 0.1000 mg | MEDICATED_PATCH | TRANSDERMAL | 12 refills | Status: DC

## 2020-05-28 NOTE — Patient Instructions (Signed)
Medication Instructions:  Start Catapres 0.1 mg patch apply every 7 days Continue all other medications *If you need a refill on your cardiac medications before your next appointment, please call your pharmacy*   Lab Work: None ordered    Testing/Procedures: None ordered   Follow-Up: At [facility], you and your health needs are our priority.  As part of our continuing mission to provide you with exceptional heart care, we have created designated Provider Care Teams.  These Care Teams include your primary Cardiologist (physician) and Advanced Practice Providers (APPs -  Physician Assistants and Nurse Practitioners) who all work together to provide you with the care you need, when you need it.  We recommend signing up for the patient portal called "MyChart".  Sign up information is provided on this After Visit Summary.  MyChart is used to connect with patients for Virtual Visits (Telemedicine).  Patients are able to view lab/test results, encounter notes, upcoming appointments, etc.  Non-urgent messages can be sent to your provider as well.   To learn more about what you can do with MyChart, go to [email or web address].    Your next appointment: 6 months  Call in July to schedule Nov appointment    The format for your next appointment: Office   Provider: [facility]

## 2020-05-31 ENCOUNTER — Telehealth: Payer: Self-pay | Admitting: Cardiology

## 2020-05-31 NOTE — Telephone Encounter (Signed)
Pt c/o medication issue:  1. Name of Medication:  cloNIDine (CATAPRES-TTS-1) 0.1 mg/24hr patch [[id number]]   2. How are you currently taking this medication (dosage and times per day)? Patch   3. Are you having a reaction (difficulty breathing--STAT)? na  4. What is your medication issue? Pt called in and stated he went to pick up this patch and it is over $100.00.  He stated he can not afford this med.  He stated that medicaid did not pay for this med.  He would like to know what he needs to to do ?    Best number -  [phone]

## 2020-05-31 NOTE — Telephone Encounter (Signed)
Patient was seen today 05/31/20. New prescription was written for Catapres patch - per patient  The pharmacy informed him Medicaid does not cover medication RN tried to Contact pharmacy to see If prior is needed , no answer at pharm  Will forward to Dr Gregary Signs

## 2020-05-31 NOTE — Telephone Encounter (Signed)
Spoke with [facility] and Rx is $3.00 and ready for pick up Advised patient, verbalized understanding

## 2020-06-10 ENCOUNTER — Ambulatory Visit: Payer: Medicaid Other | Admitting: Cardiology

## 2020-06-23 ENCOUNTER — Telehealth: Payer: Self-pay | Admitting: Cardiology

## 2020-06-23 NOTE — Telephone Encounter (Signed)
Left message for patient to call and discuss preferred scheduling weekdays and times for the Cardiac MRI/MRA chest ordered by Dr. Warren Lacy

## 2020-06-24 ENCOUNTER — Encounter: Payer: Self-pay | Admitting: Cardiology

## 2020-06-24 NOTE — Telephone Encounter (Signed)
Spoke with patient regarding the Friday 07/30/20 8:00 am Cardiac MRI/MRA appointment at [facility]---arrival time is 7:30 am 1st floor admissions office for check in.  Will mail information to patient and he voiced his understanding.

## 2020-07-23 ENCOUNTER — Telehealth: Payer: Self-pay | Admitting: Nurse Practitioner

## 2020-07-23 NOTE — Telephone Encounter (Signed)
   Pt called b/c he checked his BP tonight and noted that is was elevated @ 160/90.  He does not routinely check his BP.  He can't remember the last time he checked it, but it's been a few wks, and it was in the 190's at that time.  I reviewed his med list with him.  Prior to me calling back, he just took an additional 10mg  of ramipril (he is on 10 daily normally).  I advised that-that was reasonable for tonight.  He may ultimately require titration of his clonidine patch or a fourth agent (also on amlodipine 10), but I'd prefer that he be evaluated prior to making an adjustment.  I will reach out to our office to arrange for BP f/u.  Caller verbalized understanding and was grateful for the call back.    [name], NP 07/23/2020, 7:48 PM

## 2020-07-29 ENCOUNTER — Other Ambulatory Visit: Payer: Self-pay | Admitting: *Deleted

## 2020-07-29 ENCOUNTER — Telehealth (HOSPITAL_COMMUNITY): Payer: Self-pay | Admitting: Emergency Medicine

## 2020-07-29 DIAGNOSIS — I517 Cardiomegaly: Secondary | ICD-10-CM

## 2020-07-29 NOTE — Telephone Encounter (Signed)
Attempted to call patient regarding upcoming cardiac MR appointment. Left message on voicemail with name and callback number Avory Mimbs RN Navigator Cardiac Imaging [facility] [phone] Office [phone] Cell  

## 2020-07-30 ENCOUNTER — Ambulatory Visit (HOSPITAL_COMMUNITY): Payer: Medicaid Other

## 2020-07-30 ENCOUNTER — Other Ambulatory Visit: Payer: Self-pay

## 2020-07-30 ENCOUNTER — Ambulatory Visit (HOSPITAL_COMMUNITY)
Admission: RE | Admit: 2020-07-30 | Discharge: 2020-07-30 | Disposition: A | Discharge status: Home / Self Care | Payer: Medicaid Other | Source: Ambulatory Visit | Attending: Cardiology | Admitting: Cardiology

## 2020-07-30 DIAGNOSIS — I517 Cardiomegaly: Secondary | ICD-10-CM

## 2020-07-30 DIAGNOSIS — I7121 Aneurysm of the ascending aorta, without rupture: Secondary | ICD-10-CM

## 2020-07-30 DIAGNOSIS — I712 Thoracic aortic aneurysm, without rupture: Secondary | ICD-10-CM | POA: Diagnosis present

## 2020-07-30 MED ORDER — GADOBUTROL 1 MMOL/ML IV SOLN
10.0000 mL | Freq: Once | INTRAVENOUS | Status: AC | PRN
  Administered 2020-07-30: 10 mL via INTRAVENOUS

## 2020-08-02 ENCOUNTER — Ambulatory Visit (INDEPENDENT_AMBULATORY_CARE_PROVIDER_SITE_OTHER): Payer: Medicaid Other | Admitting: Pharmacist

## 2020-08-02 ENCOUNTER — Encounter: Payer: Self-pay | Admitting: *Deleted

## 2020-08-02 ENCOUNTER — Other Ambulatory Visit: Payer: Self-pay

## 2020-08-02 VITALS — BP 162/98 | HR 69 | Resp 16 | Ht 71.5 in | Wt 213.8 lb

## 2020-08-02 DIAGNOSIS — I10 Essential (primary) hypertension: Secondary | ICD-10-CM | POA: Diagnosis not present

## 2020-08-02 MED ORDER — AMLODIPINE BESYLATE 10 MG PO TABS: 10.0000 mg | ORAL_TABLET | Freq: Every day | ORAL | 3 refills | Status: DC

## 2020-08-02 MED ORDER — RAMIPRIL 10 MG PO CAPS: 10.0000 mg | ORAL_CAPSULE | Freq: Every day | ORAL | 3 refills | Status: DC

## 2020-08-02 MED ORDER — CLONIDINE 0.1 MG/24HR TD PTWK: 0.1000 mg | MEDICATED_PATCH | TRANSDERMAL | 3 refills | Status: DC

## 2020-08-02 NOTE — Patient Instructions (Addendum)
Return for a follow up appointment in 4-6 week  Check your blood pressure at home daily (if able) and keep record of the readings.  Take your BP meds as follows: *RESUME amlodipine 10mg  daily * *RESUME ramipril 10mg  daily* *CLONIDINE patch 0.1mg  every Monday*   Bring all of your meds, your BP cuff and your record of home blood pressures to your next appointment.  Exercise as you're able, try to walk approximately 30 minutes per day.  Keep salt intake to a minimum, especially watch canned and prepared boxed foods.  Eat more fresh fruits and vegetables and fewer canned items.  Avoid eating in fast food restaurants.    HOW TO TAKE YOUR BLOOD PRESSURE: Rest 5 minutes before taking your blood pressure.  Don't smoke or drink caffeinated beverages for at least 30 minutes before. Take your blood pressure before (not after) you eat. Sit comfortably with your back supported and both feet on the floor (don't cross your legs). Elevate your arm to heart level on a table or a desk. Use the proper sized cuff. It should fit smoothly and snugly around your bare upper arm. There should be enough room to slip a fingertip under the cuff. The bottom edge of the cuff should be 1 inch above the crease of the elbow. Ideally, take 3 measurements at one sitting and record the average.

## 2020-08-02 NOTE — Progress Notes (Signed)
Patient ID: Curtis Saunders                 DOB: 31-Jul-1955                      MRN: [id number]     HPI: Curtis Saunders is a 65 y.o. male patient of Dr Percival Spanish referred Curtis Hodgkins NP to HTN clinic. PMH includes stroke, hypertension, ascending aortic aneurism, and chest pain. Patient was seen by pharmacist on 03/14/2018 and lisinopril was changed to ramipril during appointment, but patient never showed up for follow up.  Noted clonidine patches were  prescribed by Dr Percival Spanish on 05/31/2020.  Most recent ECHO performed 02/2020 showed severe left ventricular hypertrophy with preserved EF at 65-70%. Patient called answering service on 07/23/2020 at 7:48pm to report elevated BP at 160/90.  He presets today for further assessment and medication titration. Patient reports fairly well controlled BP until 1-2 weeks ago.  Current HTN meds:  Clonidine 0.1mg  every 7 days (changes on Thursdays) Amlodipine 10mg  daily Ramipril 10mg  daily (taking 10mg  BID - self adjustment)  BP goal: 130/80  Family History: The patient's family history includes COPD in his mother; Emphysema in his mother; Heart attack (age of onset: 2) in his mother; hypertension in his sister.   Social History:  The patient  reports that he has never smoked. He has never used smokeless tobacco. He reports that he does not drink alcohol or use drugs  Exercise: activities of daily living  Home BP readings: non provided  Wt Readings from Last 3 Encounters:  08/03/20 214 lb (97.1 kg)  08/02/20 213 lb 12.8 oz (97 kg)  05/28/20 209 lb 12.8 oz (95.2 kg)   BP Readings from Last 3 Encounters:  08/03/20 (!) 144/90  08/02/20 (!) 162/98  05/28/20 (!) 148/98   Pulse Readings from Last 3 Encounters:  08/03/20 70  08/02/20 69  05/28/20 74    Past Medical History:  Diagnosis Date   Hypertension    Stroke [facility])     Current Outpatient Medications on File Prior to Visit  Medication Sig Dispense Refill   aspirin EC 81 MG EC tablet  Take 1 tablet (81 mg total) by mouth daily. Swallow whole. 30 tablet 11   No current facility-administered medications on file prior to visit.    Allergies  Allergen Reactions   Bee Venom Anaphylaxis    Blood pressure (!) 162/98, pulse 69, resp. rate 16, height 5' 11.5" (1.816 m), weight 213 lb 12.8 oz (97 kg), SpO2 98 %.  HTN (hypertension), malignant Blood pressure remains above goal. On further inspection if was noted patient has only the adhesive part of clonidine patch on and NOT the actual medicated patch.  Education on how to use clonidine patch correctly was covered during OV. Refill sent to pharmacy and patient was encouraged to stop by the office with actual medication, if needed,  to assist on proper placement.   Will resume amlodipine 10mg  daily, ramipril 10mg  and clonidine patch 0.1mg . Plan to follow up in 4-6 weeks for further medication titration if needed.   Ananda Caya Rodriguez-Guzman PharmD, BCPS, [place] [address] [place] [address] 08/04/2020 4:32 PM

## 2020-08-02 NOTE — Telephone Encounter (Signed)
Reaching out to patient to offer assistance regarding upcoming cardiac imaging study; pt verbalizes understanding of appt date/time, parking situation and where to check in, and verified current allergies; name and call back number provided for further questions should they arise Clanton Emanuelson RN Navigator Cardiac Imaging [facility] [phone] office [phone] cell 

## 2020-08-03 ENCOUNTER — Encounter: Payer: Self-pay | Admitting: Adult Health

## 2020-08-03 ENCOUNTER — Ambulatory Visit (INDEPENDENT_AMBULATORY_CARE_PROVIDER_SITE_OTHER): Payer: Medicaid Other | Admitting: Adult Health

## 2020-08-03 VITALS — BP 144/90 | HR 70 | Ht 71.0 in | Wt 214.0 lb

## 2020-08-03 DIAGNOSIS — I10 Essential (primary) hypertension: Secondary | ICD-10-CM

## 2020-08-03 DIAGNOSIS — I639 Cerebral infarction, unspecified: Secondary | ICD-10-CM

## 2020-08-03 DIAGNOSIS — E785 Hyperlipidemia, unspecified: Secondary | ICD-10-CM | POA: Diagnosis not present

## 2020-08-03 DIAGNOSIS — G4719 Other hypersomnia: Secondary | ICD-10-CM

## 2020-08-03 MED ORDER — ROSUVASTATIN CALCIUM 10 MG PO TABS: 10.0000 mg | ORAL_TABLET | Freq: Every day | ORAL | 3 refills | Status: DC

## 2020-08-03 NOTE — Patient Instructions (Addendum)
Continue aspirin 81 mg daily  and start Crestor 10mg  daily  for secondary stroke prevention  Establish care with PCP regarding cholesterol and blood pressure management  Maintain strict control of hypertension with blood pressure goal below 130/90 and cholesterol with LDL cholesterol (bad cholesterol) goal below 70 mg/dL.   We will check lab work today to look for reversible causes of fatigue      Followup in the future with me in 4 months or call earlier if needed      Thank you for coming to see [place] at [facility]. I hope we have been able to provide you high quality care today.  You may receive a patient satisfaction survey over the next few weeks. We would appreciate your feedback and comments so that we may continue to improve ourselves and the health of our patients.

## 2020-08-03 NOTE — Progress Notes (Signed)
[facility] [address] [place]. [place] [address] [phone]       STROKE FOLLOW UP NOTE  [name] Date of Birth:  03/07/1955 Medical Record Number:  [id number]   Reason for Referral: stroke follow up    SUBJECTIVE:   CHIEF COMPLAINT:  Chief Complaint  Patient presents with   Follow-up    Rm 2 alone- Here for 4 month f/u. Reports since his stroke fatigue has been a problem for him. Wanted to discuss treatment options if available.       HPI:   Today, 08/03/2020, [name] returns for 63-month stroke follow-up.  Doing well since prior visit without new or reoccurring stroke/TIA symptoms.  He does report fatigue which has been present since his stroke which persist throughout the day and can worsen with activity.  He does admit to being sedentary with little to no physical activity during the day.  Denies any difficulty sleeping, snoring or witnessed apnea.  Denies any depression type symptoms or concerns.  Compliant on aspirin without associated side effects.  Lipid panel at prior visit showed LDL 100 therefore advised to restart atorvastatin 40 mg daily -he reports taking for only short duration then self discontinued due to constipation.  Blood pressure today 144/90.  No further concerns at this time.    History provided for reference purposes only Initial visit 04/01/2020 JM: [name] is being seen for hospital follow-up unaccompanied  Reports complete recovery since discharge He has returned back to all prior activities without difficulty Denies new stroke/TIA symptoms  Completed 3 weeks DAPT and remains on aspirin alone -denies side effects Not currently on atorvastatin -thought he only needed to take for 30 days but denies side effects while taking consistently Blood pressure today 156/95 -recently switched from rampiril to valsartan but apparently blood pressure became elevated with SBP 190-200s therefore he self discontinued  valsartan and restarted taking Rampiril -blood pressures have been more consistently 150s/80s  No concerns at this time  Stroke admission 02/27/2020 Mr. Saunders Curtis is a 65 y.o. male with history of HTN  presented on 02/27/2020 with slurred speech and right facial droop.  Personally reviewed hospitalization pertinent progress notes, lab work and imaging with summary provided.  Evaluated by Dr. 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  Recommended cardiology consult for further evaluation.  Recommended DAPT for 3 weeks and aspirin alone.  LDL 108 -initiate atorvastatin 40 mg daily.  A1c 6.2.  Other stroke risk factors include prior stroke on imaging, family history of stroke and Covid 19 infection 02/12/2020.  Residual deficits mild dysarthria and right facial droop.  Evaluated by therapies and recommended outpatient SLP and discharged home in stable condition.  Stroke - Infarct in the PLIC likely due to small vessel disease  MRI - 9 mm acute infarct centered within the posterior limb of left internal capsule. This infarct may also involve the adjacent left  thalamus and lentiform nucleus.Two chronic small-vessel infarcts within the left corona radiata/basal ganglia, new as compared to the head CT of 02/20/2019. Redemonstrated chronic lacunar infarct within the right corona radiata.Background mild cerebral white matter chronic small vessel ischemic disease. CTA head & neck No large vessel occlusion, significant stenosis, or aneurysm in the head and neck. Small left upper lobe pulmonary nodules measuring up to 4 mm. No follow-up needed if patient is low-risk, Diffusely enlarged  thyroid with a possible 1.5 cm nodule in the left lobe. 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  EF 65 to 70% LDL 108 HgbA1c 6.2 VTE prophylaxis -none None prior  to admission, now on aspirin 325 mg daily. Can change to aspirin 81 mg and Plavix 75  for 3 weeks and aspirin alone Therapy recommendations:  OP SLP Disposition:  Home      ROS:   14 system review of systems performed and negative with exception of fatigue  PMH:  Past Medical History:  Diagnosis Date   Hypertension    Stroke ([place])     PSH:  Past Surgical History:  Procedure Laterality Date   APPENDECTOMY     EYE SURGERY     HAND SURGERY     KNEE SURGERY     x3, x1 R   LIPOMA EXCISION Left 10/13/2019   Procedure: SHOULDER EXCISION LIPOMA AND ROTATOR CUFF REPAIR;  Surgeon: Tania Ade, MD;  Location: [place];  Service: Orthopedics;  Laterality: Left;   TONSILLECTOMY      Social History:  Social History   Socioeconomic History   Marital status: Single    Spouse name: Not on file   Number of children: Not on file   Years of education: Not on file   Highest education level: Not on file  Occupational History   Not on file  Tobacco Use   Smoking status: Never   Smokeless tobacco: Never  Vaping Use   Vaping Use: Never used  Substance and Sexual Activity   Alcohol use: Never   Drug use: No   Sexual activity: Yes  Other Topics Concern   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     Social Determinants of Health   Financial Resource Strain: Not on file  Food Insecurity: Not on file  Transportation Needs: Not on file  Physical Activity: Not on file  Stress: Not on file  Social Connections: Not on file  Intimate Partner Violence: Not on file    Family History:  Family History  Problem Relation Age of Onset   Heart attack Mother 70   COPD Mother    Emphysema Mother    Hypertension Sister     Medications:   Current Outpatient Medications on File Prior to Visit  Medication Sig Dispense Refill   amLODipine (NORVASC) 10 MG tablet Take 1 tablet (10 mg total) by mouth daily. 90 tablet 3   aspirin EC  81 MG EC tablet Take 1 tablet (81 mg total) by mouth daily. Swallow whole. 30 tablet 11   ramipril (ALTACE) 10 MG capsule Take 1 capsule (10 mg total) by mouth daily. 90 capsule 3   cloNIDine (CATAPRES-TTS-1) 0.1 mg/24hr patch Place 1 patch (0.1 mg total) onto the skin once a week. (Patient not taking: Reported on 08/03/2020) 12 patch 3   No current facility-administered medications on file prior to visit.    Allergies:   Allergies  Allergen Reactions   Bee Venom Anaphylaxis      OBJECTIVE:  Physical Exam  Vitals:   08/03/20 1242  BP: (!) 144/90  Pulse: 70  Weight: 214 lb (97.1 kg)  Height: 5\' 11"  (1.803 m)    Body mass index is 29.85 kg/m. No results found.  General: well developed, well nourished,  pleasant middle-aged African-American male, seated, in no evident distress Head: head normocephalic and atraumatic.   Neck: supple with no carotid or supraclavicular  bruits Cardiovascular: regular rate and rhythm, no murmurs Musculoskeletal: no deformity Skin:  no rash/petichiae Vascular:  Normal pulses all extremities   Neurologic Exam Mental Status: Awake and fully alert.  Fluent speech and language.  Oriented to place and time. Recent and remote memory intact. Attention span, concentration and fund of knowledge appropriate. Mood and affect appropriate.  Cranial Nerves: Pupils equal, briskly reactive to light. Extraocular movements full without nystagmus. Visual fields full to confrontation. Hearing intact. Facial sensation intact. Face, tongue, palate moves normally and symmetrically.  Motor: Normal bulk and tone. Normal strength in all tested extremity muscles Sensory.: intact to touch , pinprick , position and vibratory sensation.  Coordination: Rapid alternating movements normal in all extremities. Finger-to-nose and heel-to-shin performed accurately bilaterally. Gait and Station: Arises from chair without difficulty. Stance is normal. Gait demonstrates normal stride  length and balance without use of assistive device. Tandem walk and heel toe with mild difficulty.  Reflexes: 1+ and symmetric. Toes downgoing.        ASSESSMENT: NORTH ESTERLINE is a 65 y.o. 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 Vascular risk factors include prior stroke on imaging, HTN, HLD, prediabetes and cardiomyopathy concerning for amyloidosis.      PLAN:  L PLIC stroke :  Recovered well without residual focal deficits although does c/o fatigue since his stroke - will check B12 and TSH to assess for reversible causes -recent CBC and CMP satisfactory.  Discussed possibility of underlying sleep apnea and recommended further evaluation but declines interest at this time.  Encouraged slowly increasing daily activity and ensuring routine physical exercise as some fatigue may be in setting of sedentary lifestyle. Continue aspirin 81 mg daily and start Crestor 10mg  daily for secondary stroke prevention.  Discussed secondary stroke prevention measures and importance of establishing care with PCP follow up for aggressive stroke risk factor management  HTN: BP goal <130/90.  On amlodipine, clonidine and ramipril per cardiology HLD: LDL goal <70.  Prior LDL 100 -reported difficulty tolerating atorvastatin therefore recommend initiating Crestor 10 mg daily Cardiomyopathy: Routine follow-up with cardiology    Follow up in 4 months or call earlier if needed   CC:  [place] provider: Dr. Leonie Man PCP: Patient, [name] (Inactive)    I spent 36 minutes of face-to-face and non-face-to-face time with patient.  This included previsit chart review, lab review, study review, order entry, electronic health record documentation, patient education regarding prior stroke and etiology,  secondary stroke prevention measures and importance of managing stroke risk factors, daytime  fatigue and possible etiologies and further evaluation and answered all other questions to patient satisfaction   Frann Rider, AGNP-BC  [facility] [address] [place], [place] [address]  Phone [phone] Fax [phone] Note: This document was prepared with digital dictation and possible smart phrase technology. Any transcriptional errors that result from this process are unintentional.

## 2020-08-03 NOTE — Progress Notes (Signed)
I agree with the above plan 

## 2020-08-04 ENCOUNTER — Telehealth: Payer: Self-pay

## 2020-08-04 ENCOUNTER — Encounter: Payer: Self-pay | Admitting: Pharmacist

## 2020-08-04 LAB — TSH: TSH: 1.33 u[IU]/mL (ref 0.450–4.500)

## 2020-08-04 LAB — VITAMIN B12: Vitamin B-12: 659 pg/mL (ref 232–1245)

## 2020-08-04 NOTE — Telephone Encounter (Signed)
Lmomed the pt to call us back and let us know if they were able to get clonidine patches and are using them correctly.will await callback from pt

## 2020-08-04 NOTE — Telephone Encounter (Signed)
-----   Message from Harrington Challenger, [place] sent at 08/04/2020  4:19 PM EDT ----- Regarding: clonidine patch Please check with patient if was able to get Rx refill for clonidine patches and is using correctly.   Thank you

## 2020-08-04 NOTE — Assessment & Plan Note (Signed)
Blood pressure remains above goal. On further inspection if was noted patient has only the adhesive part of clonidine patch on and NOT the actual medicated patch.  Education on how to use clonidine patch correctly was covered during OV. Refill sent to pharmacy and patient was encouraged to stop by the office with actual medication, if needed,  to assist on proper placement.   Will resume amlodipine 10mg  daily, ramipril 10mg  and clonidine patch 0.1mg . Plan to follow up in 4-6 weeks for further medication titration if needed.

## 2020-08-05 NOTE — Telephone Encounter (Signed)
Pt returned call to stated that they will pick the clonidine patches up today and we also went over correct placement and the pt voiced understanding

## 2020-08-12 DIAGNOSIS — M25512 Pain in left shoulder: Secondary | ICD-10-CM | POA: Insufficient documentation

## 2020-09-04 ENCOUNTER — Other Ambulatory Visit: Payer: Self-pay

## 2020-09-04 ENCOUNTER — Emergency Department (HOSPITAL_COMMUNITY): Payer: Medicaid Other

## 2020-09-04 ENCOUNTER — Encounter (HOSPITAL_COMMUNITY): Payer: Self-pay | Admitting: Emergency Medicine

## 2020-09-04 ENCOUNTER — Emergency Department (HOSPITAL_COMMUNITY)
Admission: EM | Admit: 2020-09-04 | Discharge: 2020-09-04 | Disposition: A | Discharge status: Home / Self Care | Payer: Medicaid Other | Attending: Emergency Medicine | Admitting: Emergency Medicine

## 2020-09-04 DIAGNOSIS — Z7982 Long term (current) use of aspirin: Secondary | ICD-10-CM | POA: Insufficient documentation

## 2020-09-04 DIAGNOSIS — N432 Other hydrocele: Secondary | ICD-10-CM | POA: Diagnosis not present

## 2020-09-04 DIAGNOSIS — I129 Hypertensive chronic kidney disease with stage 1 through stage 4 chronic kidney disease, or unspecified chronic kidney disease: Secondary | ICD-10-CM | POA: Insufficient documentation

## 2020-09-04 DIAGNOSIS — Z79899 Other long term (current) drug therapy: Secondary | ICD-10-CM | POA: Diagnosis not present

## 2020-09-04 DIAGNOSIS — N182 Chronic kidney disease, stage 2 (mild): Secondary | ICD-10-CM | POA: Insufficient documentation

## 2020-09-04 DIAGNOSIS — N492 Inflammatory disorders of scrotum: Secondary | ICD-10-CM | POA: Diagnosis present

## 2020-09-04 LAB — URINALYSIS, ROUTINE W REFLEX MICROSCOPIC
Bilirubin Urine: NEGATIVE
Glucose, UA: NEGATIVE mg/dL
Hgb urine dipstick: NEGATIVE
Ketones, ur: NEGATIVE mg/dL
Leukocytes,Ua: NEGATIVE
Nitrite: NEGATIVE
Protein, ur: NEGATIVE mg/dL
Specific Gravity, Urine: 1.015 (ref 1.005–1.030)
pH: 6 (ref 5.0–8.0)

## 2020-09-04 NOTE — ED Provider Notes (Signed)
[facility] DEPT Provider Note   CSN: [id number] Arrival date & time: 09/04/20  0700     History Chief Complaint  Patient presents with   Testicle Pain    ADRIAL TY is a 65 y.o. male.   Testicle Pain Pertinent negatives include no chest pain, no abdominal pain and no shortness of breath.  Male GU Problem Presenting symptoms: swelling   Presenting symptoms: no dysuria   Context: spontaneously   Relieved by:  Nothing Worsened by:  Nothing Ineffective treatments:  None tried Associated symptoms: scrotal swelling   Associated symptoms: no abdominal pain, no diarrhea, no fever, no flank pain, no genital itching, no genital lesions, no genital rash, no groin pain, no hematuria, no nausea, no penile redness, no penile swelling, no urinary frequency, no urinary hesitation, no urinary incontinence, no urinary retention and no vomiting   Risk factors: no STI exposure   Risk factors comment:  Sexually active with women only     Past Medical History:  Diagnosis Date   Hypertension    Stroke [facility])     Patient Active Problem List   Diagnosis Date Noted   CKD (chronic kidney disease) stage 2, GFR 60-89 ml/min 04/12/2020   CVA (cerebral vascular accident) ([place]) 02/27/2020   Ascending aortic aneurysm ([place]) 02/11/2020   LVH (left ventricular hypertrophy) 03/06/2018   Essential hypertension 03/06/2018   HTN (hypertension), malignant 02/20/2018   Chest pain 02/20/2018    Past Surgical History:  Procedure Laterality Date   APPENDECTOMY     EYE SURGERY     HAND SURGERY     KNEE SURGERY     x3, x1 R   LIPOMA EXCISION Left 10/13/2019   Procedure: SHOULDER EXCISION LIPOMA AND ROTATOR CUFF REPAIR;  Surgeon: Tania Ade, MD;  Location: [place];  Service: Orthopedics;  Laterality: Left;   TONSILLECTOMY         Family History  Problem Relation Age of Onset   Heart attack Mother 39   COPD Mother    Emphysema Mother     Hypertension Sister     Social History   Tobacco Use   Smoking status: Never   Smokeless tobacco: Never  Vaping Use   Vaping Use: Never used  Substance Use Topics   Alcohol use: Never   Drug use: No    Home Medications Prior to Admission medications   Medication Sig Start Date End Date Taking? Authorizing Provider  amLODipine (NORVASC) 10 MG tablet Take 1 tablet (10 mg total) by mouth daily. 08/02/20   Minus Breeding, MD  aspirin EC 81 MG EC tablet Take 1 tablet (81 mg total) by mouth daily. Swallow whole. 02/28/20   Barb Merino, MD  cloNIDine (CATAPRES-TTS-1) 0.1 mg/24hr patch Place 1 patch (0.1 mg total) onto the skin once a week. Patient not taking: Reported on 08/03/2020 08/02/20   Minus Breeding, MD  ramipril (ALTACE) 10 MG capsule Take 1 capsule (10 mg total) by mouth daily. 08/02/20   Minus Breeding, MD  rosuvastatin (CRESTOR) 10 MG tablet Take 1 tablet (10 mg total) by mouth daily. 08/03/20   Frann Rider, NP    Allergies    Bee venom  Review of Systems   Review of Systems  Constitutional:  Negative for chills and fever.  HENT:  Negative for ear pain and sore throat.   Eyes:  Negative for pain and visual disturbance.  Respiratory:  Negative for cough and shortness of breath.   Cardiovascular:  Negative  for chest pain and palpitations.  Gastrointestinal:  Negative for abdominal pain, diarrhea, nausea and vomiting.  Genitourinary:  Positive for scrotal swelling and testicular pain. Negative for bladder incontinence, dysuria, flank pain, frequency, hematuria, hesitancy and penile swelling.  Musculoskeletal:  Negative for arthralgias and back pain.  Skin:  Negative for color change and rash.  Neurological:  Negative for seizures and syncope.  All other systems reviewed and are negative.  Physical Exam Updated Vital Signs BP (!) 159/96 (BP Location: Right Arm)   Pulse (!) 58   Temp 97.8 F (36.6 C) (Oral)   Resp 18   SpO2 100%   Physical Exam Vitals and  nursing note reviewed.  Constitutional:      Appearance: Normal appearance.  HENT:     Head: Normocephalic and atraumatic.  Eyes:     Conjunctiva/sclera: Conjunctivae normal.  Pulmonary:     Effort: Pulmonary effort is normal. No respiratory distress.  Genitourinary:    Comments: Minimal, diffuse scrotal swelling.  Mild tenderness to palpation at the superior aspect of the right testicle.  No penile lesions or deformity.  No inguinal lymphadenopathy. Musculoskeletal:        General: No deformity. Normal range of motion.     Cervical back: Normal range of motion.  Skin:    General: Skin is warm and dry.  Neurological:     General: No focal deficit present.     Mental Status: He is alert and oriented to person, place, and time. Mental status is at baseline.  Psychiatric:        Mood and Affect: Mood normal.    ED Results / Procedures / Treatments   Labs (all labs ordered are listed, but only abnormal results are displayed) Labs Reviewed  URINALYSIS, ROUTINE W REFLEX MICROSCOPIC  GC/CHLAMYDIA PROBE AMP ([place]) NOT AT [facility]    EKG None  Radiology US Scrotum  Result Date: 09/04/2020 CLINICAL DATA:  Swelling EXAM: ULTRASOUND OF SCROTUM TECHNIQUE: Complete ultrasound examination of the testicles, epididymis, and other scrotal structures was performed. COMPARISON:  02/20/2020 FINDINGS: Right testicle Measurements: 3.8 x 2.1 x 2.6 cm. No mass or microlithiasis visualized. Left testicle Measurements: 3.3 x 1.6 x 2.5 cm. Heterogenous echotexture. No mass or microlithiasis visualized. Right epididymis: 4 mm cyst in the epididymal head. Otherwise normal in size and appearance. Left epididymis:  Normal in size and appearance. Hydrocele:  Moderate, right. Varicocele:  None visualized. IMPRESSION: 1. No evidence of torsion, mass, or other acute finding. 2. Stable right hydrocele 3. Stable heterogenous appearance of the mildly atrophic left testicle suggesting previous infection or trauma.  Electronically Signed   By: Lucrezia Europe M.D.   On: 09/04/2020 09:43    Procedures Procedures   Medications Ordered in ED Medications - No data to display  ED Course  I have reviewed the triage vital signs and the nursing notes.  Pertinent labs & imaging results that were available during my care of the patient were reviewed by me and considered in my medical decision making (see chart for details).    MDM Rules/Calculators/A&P                           KYELL TUROWSKI presents with scrotal swelling.  He had a little bit of tenderness on his right testicle.  Scrotal ultrasound was obtained and failed to reveal acute abnormalities.  He does have a stable right hydrocele with and sequela I of previous infection or trauma  to the left testicle.  However, he does not have evidence of torsion, mass, epididymitis.  Urinalysis is within normal limits, and gonorrhea and Chlamydia tests are pending.  I recommended symptomatic management and close follow-up with urology.  Symptoms are also not consistent with nephrotic syndrome, anasarca, or other systemic source of swelling. Final Clinical Impression(s) / ED Diagnoses Final diagnoses:  Other hydrocele    Rx / DC Orders ED Discharge Orders     None        Arnaldo Natal, MD 09/04/20 1007

## 2020-09-04 NOTE — ED Triage Notes (Addendum)
Patient reports testicle swelling and pain which started yesterday. He reports this has happened twice in the past and he was treated w/ antibiotics. Denies fever, chills or body aches.

## 2020-09-06 ENCOUNTER — Other Ambulatory Visit: Payer: Self-pay

## 2020-09-06 ENCOUNTER — Ambulatory Visit (INDEPENDENT_AMBULATORY_CARE_PROVIDER_SITE_OTHER): Payer: Medicaid Other | Admitting: Pharmacist Clinician (PhC)/ Clinical Pharmacy Specialist

## 2020-09-06 VITALS — BP 146/88 | HR 55 | Resp 16 | Ht 71.0 in | Wt 215.8 lb

## 2020-09-06 DIAGNOSIS — I10 Essential (primary) hypertension: Secondary | ICD-10-CM

## 2020-09-06 MED ORDER — CHLORTHALIDONE 25 MG PO TABS: 25.0000 mg | ORAL_TABLET | Freq: Every day | ORAL | 3 refills | Status: DC

## 2020-09-06 NOTE — Assessment & Plan Note (Signed)
Patient with essential hypertension, with both systolic and diastolic elevations.  Will have him add chlorthalidone 12.5 mg once daily and continue with other medications.  He should continue to monitor home readings and have metabolic panel drawn in 2 weeks.  We will see him back in the office in 6 weeks for follow up.

## 2020-09-06 NOTE — Patient Instructions (Addendum)
Return for a a follow up appointment September 26 at 9:30 am  Go to the lab in 2 weeks to check kidney function.  (Last week of August)  Check your blood pressure at home daily (if able) and keep record of the readings.  Take your BP meds as follows:  Add chlorthalidone 12.5 mg (1/2 tablet) daily in the mornings  Continue with all other medications.   Bring all of your meds, your BP cuff and your record of home blood pressures to your next appointment.  Exercise as you're able, try to walk approximately 30 minutes per day.  Keep salt intake to a minimum, especially watch canned and prepared boxed foods.  Eat more fresh fruits and vegetables and fewer canned items.  Avoid eating in fast food restaurants.    HOW TO TAKE YOUR BLOOD PRESSURE: Rest 5 minutes before taking your blood pressure.  Don't smoke or drink caffeinated beverages for at least 30 minutes before. Take your blood pressure before (not after) you eat. Sit comfortably with your back supported and both feet on the floor (don't cross your legs). Elevate your arm to heart level on a table or a desk. Use the proper sized cuff. It should fit smoothly and snugly around your bare upper arm. There should be enough room to slip a fingertip under the cuff. The bottom edge of the cuff should be 1 inch above the crease of the elbow. Ideally, take 3 measurements at one sitting and record the average.

## 2020-09-06 NOTE — Progress Notes (Signed)
Patient ID: Curtis Saunders                 DOB: [birth year]                      MRN: [id number]     HPI: Curtis Saunders is a 65 y.o. male patient of Dr Percival Spanish referred Curtis Hodgkins NP to HTN clinic. PMH includes stroke, hypertension, ascending aortic aneurism, and chest pain. Patient was seen by pharmacist on 03/14/2018 and lisinopril was changed to ramipril during appointment, but patient never showed up for follow up.  Noted clonidine patches were  prescribed by Dr Percival Spanish on 05/31/2020.  Most recent ECHO performed 02/2020 showed severe left ventricular hypertrophy with preserved EF at 65-70%. Patient called answering service on 07/23/2020 at 7:48pm to report elevated BP at 160/90.  He presets today for further assessment and medication titration. Patient reports fairly well controlled BP until 1-2 weeks ago.  At last visit it was determined that he was only wearing the protective cover on the clonidine patch.  He was educated on proper patch placement and returns today for follow up.   He notes that he has been doing the patches correctly since that last visit.  Unfortunately his home BP readings have not significantly improved.  Today he reports no dry mouth or eyes, and no lower extremity edema.    Current HTN meds:  Clonidine 0.'1mg'$  every 7 days (changes on Thursdays) Amlodipine '10mg'$  daily - am Ramipril '10mg'$  daily - am  BP goal: 130/80  Family History: The patient's family history includes COPD in his mother; Emphysema in his mother; Heart attack (age of onset: 62) in his mother; hypertension in his sister.   Social History:  The patient  reports that he has never smoked. He has never used smokeless tobacco. He reports that he does not drink alcohol or use drugs; no caffeine sodas  Diet: mostly home cooked meals, occasionally adds salt with cooking, never at table; fresh vegetables; no pork but other proteins  Exercise: activities of daily living  Home BP readings: none provided; he  notes that they continue to be in the 140-160/90's at home.    Labs:  3/22:  Na 142, K 4.2, Glu 89, BUN 17, SCr 1.17 GFR 70  Wt Readings from Last 3 Encounters:  09/06/20 215 lb 12.8 oz (97.9 kg)  08/03/20 214 lb (97.1 kg)  08/02/20 213 lb 12.8 oz (97 kg)   BP Readings from Last 3 Encounters:  09/06/20 (!) 146/88  09/04/20 (!) 158/102  08/03/20 (!) 144/90   Pulse Readings from Last 3 Encounters:  09/06/20 (!) 55  09/04/20 60  08/03/20 70    Past Medical History:  Diagnosis Date   Hypertension    Stroke [facility])     Current Outpatient Medications on File Prior to Visit  Medication Sig Dispense Refill   amLODipine (NORVASC) 10 MG tablet Take 1 tablet (10 mg total) by mouth daily. 90 tablet 3   aspirin EC 81 MG EC tablet Take 1 tablet (81 mg total) by mouth daily. Swallow whole. 30 tablet 11   cloNIDine (CATAPRES-TTS-1) 0.1 mg/24hr patch Place 1 patch (0.1 mg total) onto the skin once a week. 12 patch 3   ramipril (ALTACE) 10 MG capsule Take 1 capsule (10 mg total) by mouth daily. 90 capsule 3   rosuvastatin (CRESTOR) 10 MG tablet Take 1 tablet (10 mg total) by mouth daily. 90 tablet 3   No  current facility-administered medications on file prior to visit.    Allergies  Allergen Reactions   Bee Venom Anaphylaxis    Blood pressure (!) 146/88, pulse (!) 55, resp. rate 16, height '5\' 11"'$  (1.803 m), weight 215 lb 12.8 oz (97.9 kg), SpO2 99 %.  Essential hypertension Patient with essential hypertension, with both systolic and diastolic elevations.  Will have him add chlorthalidone 12.5 mg once daily and continue with other medications.  He should continue to monitor home readings and have metabolic panel drawn in 2 weeks.  We will see him back in the office in 6 weeks for follow up.    Tommy Medal PharmD CPP [facility] [address] [place] [address] 09/06/2020 9:47 AM

## 2020-10-18 ENCOUNTER — Other Ambulatory Visit: Payer: Self-pay

## 2020-10-18 ENCOUNTER — Encounter: Payer: Self-pay | Admitting: Cardiology

## 2020-10-18 ENCOUNTER — Ambulatory Visit (INDEPENDENT_AMBULATORY_CARE_PROVIDER_SITE_OTHER): Payer: Medicaid Other | Admitting: Pharmacist Clinician (PhC)/ Clinical Pharmacy Specialist

## 2020-10-18 DIAGNOSIS — I10 Essential (primary) hypertension: Secondary | ICD-10-CM

## 2020-10-18 LAB — BASIC METABOLIC PANEL
BUN/Creatinine Ratio: 14 (ref 10–24)
BUN: 22 mg/dL (ref 8–27)
CO2: 24 mmol/L (ref 20–29)
Calcium: 9.7 mg/dL (ref 8.6–10.2)
Chloride: 103 mmol/L (ref 96–106)
Creatinine, Ser: 1.52 mg/dL — ABNORMAL HIGH (ref 0.76–1.27)
Glucose: 108 mg/dL — ABNORMAL HIGH (ref 70–99)
Potassium: 4.3 mmol/L (ref 3.5–5.2)
Sodium: 143 mmol/L (ref 134–144)
eGFR: 51 mL/min/{1.73_m2} — ABNORMAL LOW (ref >59)

## 2020-10-18 NOTE — Progress Notes (Signed)
Patient ID: Curtis Saunders                 DOB: 1955-10-27                      MRN: [id number]     HPI: Curtis Saunders is a 65 y.o. male patient of Dr Percival Spanish referred [name] NP to HTN clinic. PMH includes stroke, hypertension, ascending aortic aneurism, and chest pain. Patient was seen by pharmacist on 03/14/2018 and lisinopril was changed to ramipril during appointment, but patient never showed up for follow up.  Noted clonidine patches were  prescribed by Dr Percival Spanish on 05/31/2020.  Most recent ECHO performed 02/2020 showed severe left ventricular hypertrophy with preserved EF at 65-70%. Patient called answering service on 07/23/2020 at 7:48pm to report elevated BP at 160/90.  He was seen soon after that and it was determined that he was only wearing the protective cover on the clonidine patch.  At his most recent visit pressure was still elevated and chlorthalidone 12.5 mg daily was added.    He returns today for follow up.  States he has been doing well and home readings are mostly 132'G systolic, although he has no readings with him today.   Notes that he quit using the clonidine patches a few weeks ago and his pressure has remained WNL.    Current HTN meds:  Chlorthalidone 12.5 mg daily - am Amlodipine 10mg  daily - am Ramipril 10mg  daily - am  BP goal: 130/80  Family History: The patient's family history includes COPD in his mother; Emphysema in his mother; Heart attack (age of onset: 4) in his mother; hypertension in his sister.   Social History:  The patient  reports that he has never smoked. He has never used smokeless tobacco. He reports that he does not drink alcohol or use drugs; no caffeine sodas  Diet: mostly home cooked meals, occasionally adds salt with cooking, never at table; fresh vegetables; no pork but other proteins  Exercise: activities of daily living  Home BP readings: none provided; he notes that they continue to be in the 401'U systolic, cannot recall any  diastolic readings.     Labs:  3/22:  Na 142, K 4.2, Glu 89, BUN 17, SCr 1.17 GFR 70  Wt Readings from Last 3 Encounters:  10/18/20 217 lb 6.4 oz (98.6 kg)  09/06/20 215 lb 12.8 oz (97.9 kg)  08/03/20 214 lb (97.1 kg)   BP Readings from Last 3 Encounters:  10/18/20 118/84  09/06/20 (!) 146/88  09/04/20 (!) 158/102   Pulse Readings from Last 3 Encounters:  10/18/20 74  09/06/20 (!) 55  09/04/20 60    Past Medical History:  Diagnosis Date   Hypertension    Stroke [facility])     Current Outpatient Medications on File Prior to Visit  Medication Sig Dispense Refill   amLODipine (NORVASC) 10 MG tablet Take 1 tablet (10 mg total) by mouth daily. 90 tablet 3   aspirin EC 81 MG EC tablet Take 1 tablet (81 mg total) by mouth daily. Swallow whole. 30 tablet 11   chlorthalidone (HYGROTON) 25 MG tablet Take 1 tablet (25 mg total) by mouth daily. 90 tablet 3   ramipril (ALTACE) 10 MG capsule Take 1 capsule (10 mg total) by mouth daily. 90 capsule 3   No current facility-administered medications on file prior to visit.    Allergies  Allergen Reactions   Bee Venom Anaphylaxis  Blood pressure 118/84, pulse 74, resp. rate 16, height 5\' 11"  (1.803 m), weight 217 lb 6.4 oz (98.6 kg), SpO2 96 %.  HTN (hypertension), malignant Patient with hypertension, controlled in the office today.  Will have him continue with his current medications.  Will have him get BMET while in office today and he is due to see PA for follow up in November/December.   We can see him again after that should his pressure increase.    Tommy Medal PharmD CPP [facility] [address] [place] [address] 10/18/2020 1:08 PM

## 2020-10-18 NOTE — Patient Instructions (Signed)
Return for a a follow up appointment with PA in 2-3 months  Check your blood pressure at home daily and keep record of the readings.  Take your BP meds as follows:  Continue with your current medications  Please reach out to me if you notice your home BP readings > 140/90.  Erasmo Downer at [phone]  Bring all of your meds, your BP cuff and your record of home blood pressures to your next appointment.  Exercise as you're able, try to walk approximately 30 minutes per day.  Keep salt intake to a minimum, especially watch canned and prepared boxed foods.  Eat more fresh fruits and vegetables and fewer canned items.  Avoid eating in fast food restaurants.    HOW TO TAKE YOUR BLOOD PRESSURE: Rest 5 minutes before taking your blood pressure.  Don't smoke or drink caffeinated beverages for at least 30 minutes before. Take your blood pressure before (not after) you eat. Sit comfortably with your back supported and both feet on the floor (don't cross your legs). Elevate your arm to heart level on a table or a desk. Use the proper sized cuff. It should fit smoothly and snugly around your bare upper arm. There should be enough room to slip a fingertip under the cuff. The bottom edge of the cuff should be 1 inch above the crease of the elbow. Ideally, take 3 measurements at one sitting and record the average.

## 2020-10-18 NOTE — Assessment & Plan Note (Signed)
Patient with hypertension, controlled in the office today.  Will have him continue with his current medications.  Will have him get BMET while in office today and he is due to see PA for follow up in November/December.   We can see him again after that should his pressure increase.

## 2020-10-21 ENCOUNTER — Telehealth: Payer: Self-pay | Admitting: *Deleted

## 2020-10-21 DIAGNOSIS — N182 Chronic kidney disease, stage 2 (mild): Secondary | ICD-10-CM

## 2020-10-21 NOTE — Telephone Encounter (Signed)
Spoke with pt, aware of results. Lab orders mailed to the pt

## 2020-10-21 NOTE — Telephone Encounter (Signed)
-----   Message from Minus Breeding, MD sent at 10/19/2020  9:38 PM EDT ----- Creat is mildly elevated.  No change in therapy.  Repeat a BMET in one month.  Call Mr. Dunavan with the results

## 2020-10-29 LAB — BASIC METABOLIC PANEL
BUN/Creatinine Ratio: 17 (ref 10–24)
BUN: 26 mg/dL (ref 8–27)
CO2: 24 mmol/L (ref 20–29)
Calcium: 10 mg/dL (ref 8.6–10.2)
Chloride: 100 mmol/L (ref 96–106)
Creatinine, Ser: 1.57 mg/dL — ABNORMAL HIGH (ref 0.76–1.27)
Glucose: 144 mg/dL — ABNORMAL HIGH (ref 70–99)
Potassium: 4.1 mmol/L (ref 3.5–5.2)
Sodium: 140 mmol/L (ref 134–144)
eGFR: 50 mL/min/{1.73_m2} — ABNORMAL LOW (ref >59)

## 2020-11-09 ENCOUNTER — Ambulatory Visit (HOSPITAL_BASED_OUTPATIENT_CLINIC_OR_DEPARTMENT_OTHER): Admit: 2020-11-09 | Payer: No Typology Code available for payment source | Admitting: Specialist

## 2020-11-09 ENCOUNTER — Encounter (HOSPITAL_BASED_OUTPATIENT_CLINIC_OR_DEPARTMENT_OTHER): Payer: Self-pay

## 2020-11-09 SURGERY — ARTHROSCOPY, SHOULDER, WITH ROTATOR CUFF REPAIR
Anesthesia: General | Laterality: Left

## 2020-11-23 ENCOUNTER — Telehealth: Payer: Self-pay | Admitting: Cardiology

## 2020-11-23 NOTE — Telephone Encounter (Signed)
Unknown what could be causing kidney pain.  He has medications that are metabolized by kidneys, however they are not new starts.  Recommend establishing with PCP or going to urgent care for evaluation.

## 2020-11-23 NOTE — Telephone Encounter (Signed)
Curtis Saunders is calling stating his kidney's are hurting. he believes it is due to one of the medications Dr. Percival Spanish has him on, but he is unsure of which one due to him being on three.

## 2020-11-23 NOTE — Telephone Encounter (Signed)
Returned the call to the patient. He stated that he feels like he needs to change his medication. He stated that his "kidneys hurt and it's making him lazy." He checks his blood pressure at home and stated that it stays around 118/84 typically.  He denies any other symptoms except for fatigue and pain in the kidneys. He was not able to give more detail on the " hurting kidneys." Patient does not have a PCP.

## 2020-11-24 NOTE — Telephone Encounter (Signed)
The patient has been made aware and will call his PCP for further assessment for his kidney pain. He stated that he is established with a PCP now

## 2020-12-14 ENCOUNTER — Other Ambulatory Visit: Payer: Self-pay | Admitting: Adult Health

## 2020-12-14 ENCOUNTER — Encounter: Payer: Self-pay | Admitting: Adult Health

## 2020-12-14 ENCOUNTER — Ambulatory Visit: Payer: Medicaid Other | Admitting: Adult Health

## 2020-12-14 ENCOUNTER — Other Ambulatory Visit: Payer: Self-pay

## 2020-12-14 VITALS — BP 124/74 | HR 84 | Ht 71.0 in | Wt 227.2 lb

## 2020-12-14 DIAGNOSIS — G4719 Other hypersomnia: Secondary | ICD-10-CM | POA: Diagnosis not present

## 2020-12-14 DIAGNOSIS — E785 Hyperlipidemia, unspecified: Secondary | ICD-10-CM | POA: Diagnosis not present

## 2020-12-14 DIAGNOSIS — I639 Cerebral infarction, unspecified: Secondary | ICD-10-CM | POA: Diagnosis not present

## 2020-12-14 DIAGNOSIS — E041 Nontoxic single thyroid nodule: Secondary | ICD-10-CM

## 2020-12-14 DIAGNOSIS — I10 Essential (primary) hypertension: Secondary | ICD-10-CM

## 2020-12-14 NOTE — Progress Notes (Signed)
[facility] [address] [place] [address] [phone]       STROKE FOLLOW UP NOTE  [name] Date of Birth:  March 26, 1955 Medical Record Number:  [id number]   Reason for Referral: stroke follow up    SUBJECTIVE:   CHIEF COMPLAINT:  Chief Complaint  Patient presents with   Follow-up    Room 2. Pt alone. Pt states his only complaint is that he feels tired all the time.       HPI:   Update 12/14/2020 JM: Returns for 58-month stroke follow-up.  Overall stable -denies new or reoccurring stroke/TIA symptoms Continues to experience excessive daytime fatigue which has been present since his stroke. He reports sleeping well through the night - will occasionally wake up in the middle of the night but shortly go back to sleep. He will typically get 8-12 hrs of sleep per night. Does endorse limited to no physical activity or exercise.  Denies depression/anxiety.  Compliant on aspirin and Crestor 10 10 mg daily-denies side effects Blood pressure today 124/74 -routinely followed by cardiology  No new concerns at this time     History provided for reference purposes only Update 08/03/2020 JM: Curtis Saunders returns for 93-month stroke follow-up.  Doing well since prior visit without new or reoccurring stroke/TIA symptoms.  He does report fatigue which has been present since his stroke which persist throughout the day and can worsen with activity.  He does admit to being sedentary with little to no physical activity during the day.  Denies any difficulty sleeping, snoring or witnessed apnea.  Denies any depression type symptoms or concerns.  Compliant on aspirin without associated side effects.  Lipid panel at prior visit showed LDL 100 therefore advised to restart atorvastatin 40 mg daily -he reports taking for only short duration then self discontinued due to constipation.  Blood pressure today 144/90.  No further concerns at this time.  Initial visit  04/01/2020 JM: Curtis Saunders is being seen for hospital follow-up unaccompanied  Reports complete recovery since discharge He has returned back to all prior activities without difficulty Denies new stroke/TIA symptoms  Completed 3 weeks DAPT and remains on aspirin alone -denies side effects Not currently on atorvastatin -thought he only needed to take for 30 days but denies side effects while taking consistently Blood pressure today 156/95 -recently switched from rampiril to valsartan but apparently blood pressure became elevated with SBP 190-200s therefore he self discontinued valsartan and restarted taking Rampiril -blood pressures have been more consistently 150s/80s  No concerns at this time  Stroke admission 02/27/2020 Curtis Saunders is a 65 y.o. male with history of HTN  presented on 02/27/2020 with slurred speech and right facial droop.  Personally reviewed hospitalization pertinent progress notes, lab work and imaging with summary provided.  Evaluated by Dr. 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  Recommended cardiology consult for further evaluation.  Recommended DAPT for 3 weeks and aspirin alone.  LDL 108 -initiate atorvastatin 40 mg daily.  A1c 6.2.  Other stroke risk factors include prior stroke on imaging, family history of stroke and Covid 19 infection 02/12/2020.  Residual deficits mild dysarthria and right facial droop.  Evaluated by therapies and recommended outpatient SLP and discharged home in stable condition.  Stroke - Infarct in the PLIC likely due to small vessel disease  MRI - 9 mm  acute infarct centered within the posterior limb of left internal capsule. This infarct may also involve the adjacent left  thalamus and lentiform nucleus.Two chronic small-vessel infarcts within the left corona radiata/basal ganglia, new as compared to the  head CT of 02/20/2019. Redemonstrated chronic lacunar infarct within the right corona radiata.Background mild cerebral white matter chronic small vessel ischemic disease. CTA head & neck No large vessel occlusion, significant stenosis, or aneurysm in the head and neck. Small left upper lobe pulmonary nodules measuring up to 4 mm. No follow-up needed if patient is low-risk, Diffusely enlarged thyroid with a possible 1.5 cm nodule in the left lobe. 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  EF 65 to 70% LDL 108 HgbA1c 6.2 VTE prophylaxis -none None prior to admission, now on aspirin 325 mg daily. Can change to aspirin 81 mg and Plavix 75  for 3 weeks and aspirin alone Therapy recommendations:  OP SLP Disposition:  Home      ROS:   14 system review of systems performed and negative with exception of fatigue  PMH:  Past Medical History:  Diagnosis Date   Hypertension    Stroke ([place])     PSH:  Past Surgical History:  Procedure Laterality Date   APPENDECTOMY     EYE SURGERY     HAND SURGERY     KNEE SURGERY     x3, x1 R   LIPOMA EXCISION Left 10/13/2019   Procedure: SHOULDER EXCISION LIPOMA AND ROTATOR CUFF REPAIR;  Surgeon: Tania Ade, MD;  Location: [facility];  Service: Orthopedics;  Laterality: Left;   TONSILLECTOMY      Social History:  Social History   Socioeconomic History   Marital status: Single    Spouse name: Not on file   Number of children: Not on file   Years of education: Not on file   Highest education level: Not on file  Occupational History   Not on file  Tobacco Use   Smoking status: Never   Smokeless tobacco: Never  Vaping Use   Vaping Use: Never used  Substance and Sexual Activity   Alcohol use: Never   Drug use: No   Sexual activity: Yes  Other Topics Concern   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     Social Determinants of Health   Financial  Resource Strain: Not on file  Food Insecurity: Not on file  Transportation Needs: Not on file  Physical Activity: Not on file  Stress: Not on file  Social Connections: Not on file  Intimate Partner Violence: Not on file    Family History:  Family History  Problem Relation Age of Onset   Heart attack Mother 23   COPD Mother    Emphysema Mother    Hypertension Sister     Medications:   Current Outpatient Medications on File Prior to Visit  Medication Sig Dispense Refill   amLODipine (NORVASC) 10 MG tablet Take 1 tablet (10 mg total) by mouth daily. 90 tablet 3   aspirin EC 81 MG EC tablet Take 1 tablet (81 mg total) by mouth daily. Swallow whole. 30 tablet 11   ramipril (ALTACE) 10 MG capsule Take 1 capsule (10 mg total) by mouth daily. 90 capsule 3   chlorthalidone (HYGROTON) 25 MG tablet Take 1 tablet (25 mg total) by mouth daily. 90 tablet 3   No current facility-administered medications on file prior to visit.  Allergies:   Allergies  Allergen Reactions   Bee Venom Anaphylaxis      OBJECTIVE:  Physical Exam  Vitals:   12/14/20 1233  BP: 124/74  Pulse: 84  Weight: 227 lb 3.2 oz (103.1 kg)  Height: 5\' 11"  (1.803 m)     Body mass index is 31.69 kg/m. No results found.  General: well developed, well nourished,  pleasant middle-aged African-American male, seated, in no evident distress Head: head normocephalic and atraumatic.   Neck: supple with no carotid or supraclavicular bruits Cardiovascular: regular rate and rhythm, no murmurs Musculoskeletal: no deformity Skin:  no rash/petichiae Vascular:  Normal pulses all extremities   Neurologic Exam Mental Status: Awake and fully alert.  Fluent speech and language.  Oriented to place and time. Recent and remote memory intact. Attention span, concentration and fund of knowledge appropriate. Mood and affect appropriate.  Cranial Nerves: Pupils equal, briskly reactive to light. Extraocular movements full without  nystagmus. Visual fields full to confrontation. Hearing intact. Facial sensation intact. Face, tongue, palate moves normally and symmetrically.  Motor: Normal bulk and tone. Normal strength in all tested extremity muscles Sensory.: intact to touch , pinprick , position and vibratory sensation.  Coordination: Rapid alternating movements normal in all extremities. Finger-to-nose and heel-to-shin performed accurately bilaterally. Gait and Station: Arises from chair without difficulty. Stance is normal. Gait demonstrates normal stride length and balance without use of assistive device. Tandem walk and heel toe with mild difficulty.  Reflexes: 1+ and symmetric. Toes downgoing.        ASSESSMENT: Curtis Saunders is a 65 y.o. 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 Vascular risk factors include prior stroke on imaging, HTN, HLD, prediabetes and cardiomyopathy.     PLAN:  L PLIC stroke :  Recovered well without residual focal deficits although does c/o fatigue since his stroke - will obtain lab work to rule out reversible causes.  Referral also placed to [facility] for further evaluation of possible underlying sleep apnea.  Encouraged slowly increasing daily activity and ensuring routine physical exercise as some fatigue may be in setting of sedentary lifestyle. Continue aspirin 81 mg daily and Crestor 10mg  daily for secondary stroke prevention.  Discussed secondary stroke prevention measures and importance of establishing care with PCP (again discussed) follow up for aggressive stroke risk factor management  HTN: BP goal <130/90.  Stable on current regimen per cardiology HLD: LDL goal <70.  Prior LDL 100 -continue Crestor 10 mg daily.  Repeat lipid panel today Cardiomyopathy: Routine follow-up with cardiology Thyroid nodule: CTA 02/2020 diffusely enlarged thyroid  with a possible 1.5 cm nodule in the left lobe.  Obtain US thyroid for f/u testing.     Follow up in 6 months or call earlier if needed     I spent 31 minutes of face-to-face and non-face-to-face time with patient.  This included previsit chart review, lab review, study review, order entry, electronic health record documentation, patient education regarding prior stroke and etiology,  secondary stroke prevention measures and importance of managing stroke risk factors, continued daytime fatigue and possible etiologies and further evaluation and answered all other questions to patient satisfaction  Frann Rider, AGNP-BC  [facility] [address] [place], [place] [address]  Phone [phone] Fax [phone] Note: This document was prepared with digital dictation and possible smart phrase technology. Any transcriptional errors that result from  this process are unintentional.

## 2020-12-14 NOTE — Patient Instructions (Addendum)
No changes today  You will be called to set up thyroid ultrasound and initial evaluation for possible underlying sleep apnea  We will call you once your lab work results  Please ensure you establish care with PCP     Followup in the future with me in 6 months or call earlier if needed       Thank you for coming to see [place] at [facility]. I hope we have been able to provide you high quality care today.  You may receive a patient satisfaction survey over the next few weeks. We would appreciate your feedback and comments so that we may continue to improve ourselves and the health of our patients.

## 2020-12-15 ENCOUNTER — Other Ambulatory Visit: Payer: Self-pay | Admitting: Adult Health

## 2020-12-15 LAB — LIPID PANEL
Chol/HDL Ratio: 3.5 ratio (ref 0.0–5.0)
Cholesterol, Total: 197 mg/dL (ref 100–199)
HDL: 56 mg/dL (ref >39)
LDL Chol Calc (NIH): 113 mg/dL — ABNORMAL HIGH (ref 0–99)
Triglycerides: 163 mg/dL — ABNORMAL HIGH (ref 0–149)
VLDL Cholesterol Cal: 28 mg/dL (ref 5–40)

## 2020-12-15 LAB — COMPREHENSIVE METABOLIC PANEL
ALT: 35 IU/L (ref 0–44)
AST: 28 IU/L (ref 0–40)
Albumin/Globulin Ratio: 2 (ref 1.2–2.2)
Albumin: 4.7 g/dL (ref 3.8–4.8)
Alkaline Phosphatase: 94 IU/L (ref 44–121)
BUN/Creatinine Ratio: 17 (ref 10–24)
BUN: 25 mg/dL (ref 8–27)
Bilirubin Total: 0.3 mg/dL (ref 0.0–1.2)
CO2: 23 mmol/L (ref 20–29)
Calcium: 9.8 mg/dL (ref 8.6–10.2)
Chloride: 99 mmol/L (ref 96–106)
Creatinine, Ser: 1.48 mg/dL — ABNORMAL HIGH (ref 0.76–1.27)
Globulin, Total: 2.3 g/dL (ref 1.5–4.5)
Glucose: 126 mg/dL — ABNORMAL HIGH (ref 70–99)
Potassium: 3.5 mmol/L (ref 3.5–5.2)
Sodium: 139 mmol/L (ref 134–144)
Total Protein: 7 g/dL (ref 6.0–8.5)
eGFR: 53 mL/min/{1.73_m2} — ABNORMAL LOW (ref >59)

## 2020-12-15 LAB — CBC
Hematocrit: 39.7 % (ref 37.5–51.0)
Hemoglobin: 13.4 g/dL (ref 13.0–17.7)
MCH: 29.4 pg (ref 26.6–33.0)
MCHC: 33.8 g/dL (ref 31.5–35.7)
MCV: 87 fL (ref 79–97)
Platelets: 247 10*3/uL (ref 150–450)
RBC: 4.56 x10E6/uL (ref 4.14–5.80)
RDW: 13.4 % (ref 11.6–15.4)
WBC: 7.6 10*3/uL (ref 3.4–10.8)

## 2020-12-15 LAB — SEDIMENTATION RATE: Sed Rate: 14 mm/hr (ref 0–30)

## 2020-12-15 LAB — C-REACTIVE PROTEIN: CRP: 2 mg/L (ref 0–10)

## 2020-12-15 LAB — FERRITIN: Ferritin: 354 ng/mL (ref 30–400)

## 2020-12-15 LAB — TSH: TSH: 1.45 u[IU]/mL (ref 0.450–4.500)

## 2020-12-15 MED ORDER — ROSUVASTATIN CALCIUM 20 MG PO TABS: 20.0000 mg | ORAL_TABLET | Freq: Every day | ORAL | 3 refills | Status: DC

## 2020-12-20 ENCOUNTER — Telehealth: Payer: Self-pay

## 2020-12-20 NOTE — Telephone Encounter (Signed)
Contacted pt regarding labs, no answer

## 2020-12-20 NOTE — Telephone Encounter (Signed)
-----   Message from Frann Rider, NP sent at 12/15/2020  5:14 PM EST ----- Please advise patient that recent lab work showed elevated LDL or bad cholesterol at 113 with goal less than 70. I do question compliance - she reported he was taking but was not on med list - will place order for Crestor again but at 20mg  dose. Thank you

## 2020-12-20 NOTE — Telephone Encounter (Signed)
Contacted pt again, LVM per DPR informing him his recent lab work showed elevated LDL or bad cholesterol at 113 with goal less than 70. [name] will place order for Crestor again but at 20mg  dose. Advised to call back with questions

## 2020-12-23 NOTE — Progress Notes (Signed)
Cardiology Office Note   Date:  12/24/2020   ID:  ABDEL EFFINGER, DOB 1955-11-18, MRN [id number]  PCP:  Pcp, No  Cardiologist:   Minus Breeding, MD Referring:  ED provider  Chief Complaint  Patient presents with   LVH       History of Present Illness: Curtis Saunders is a 65 y.o. male who was referred by ED for evaluation of chest pain.  He was in the ED in late Jan 2021 for chest pain.    There was no evidence objective evidence of ischemia.   The patient had moderate LVH on echo.  He was noted there to have negative cardiac enzymes.  CT of the chest demonstrated no acute abnormalities.  Apparently because he had headaches he also had a CT of his head which demonstrated no acute abnormalities.  He was treated with morphine, ketorolac and nitroglycerin.    After seeing me I sent him for a coronary CTA and he had no coronary calcium and NL coronaries.  He does have an ascending aortic aneurysm.   In Feb 2022 he was hospitalized with acute CVA.  There was an MRI with posterior limb internal capsule acute infarct.  Echo demonstrated severe LVH.  He was treated with ASA and Plavix.     Since he was last seen he has done well.  He works part-time in [employer].  He has no cardiovascular complaints. The patient denies any new symptoms such as chest discomfort, neck or arm discomfort. There has been no new shortness of breath, PND or orthopnea. There have been no reported palpitations, presyncope or syncope.   He said he got a clonidine patch but his blood pressure actually went up and he has not been wearing it.   Past Medical History:  Diagnosis Date   Hypertension    Stroke [facility])     Past Surgical History:  Procedure Laterality Date   APPENDECTOMY     EYE SURGERY     HAND SURGERY     KNEE SURGERY     x3, x1 R   LIPOMA EXCISION Left 10/13/2019   Procedure: SHOULDER EXCISION LIPOMA AND ROTATOR CUFF REPAIR;  Surgeon: Tania Ade, MD;  Location: [place];   Service: Orthopedics;  Laterality: Left;   TONSILLECTOMY       Current Outpatient Medications  Medication Sig Dispense Refill   amLODipine (NORVASC) 10 MG tablet Take 1 tablet (10 mg total) by mouth daily. 90 tablet 3   aspirin EC 81 MG EC tablet Take 1 tablet (81 mg total) by mouth daily. Swallow whole. 30 tablet 11   ramipril (ALTACE) 10 MG capsule Take 1 capsule (10 mg total) by mouth daily. 90 capsule 3   rosuvastatin (CRESTOR) 20 MG tablet Take 1 tablet (20 mg total) by mouth daily. 90 tablet 3   chlorthalidone (HYGROTON) 25 MG tablet Take 1 tablet (25 mg total) by mouth daily. 90 tablet 3   No current facility-administered medications for this visit.    Allergies:   Bee venom   ROS:  Please see the history of present illness.   Otherwise, review of systems are positive for none.   All other systems are reviewed and negative.    PHYSICAL EXAM: VS:  BP 132/68   Pulse 63   Ht 5\' 11"  (1.803 m)   Wt 230 lb (104.3 kg)   SpO2 98%   BMI 32.08 kg/m  , BMI Body mass index is 32.08 kg/m.  GENERAL:  Well appearing NECK:  No jugular venous distention, waveform within normal limits, carotid upstroke brisk and symmetric, no bruits, no thyromegaly LUNGS:  Clear to auscultation bilaterally CHEST:  Unremarkable HEART:  PMI not displaced or sustained,S1 and S2 within normal limits, no S3, no S4, no clicks, no rubs, no murmurs ABD:  Flat, positive bowel sounds normal in frequency in pitch, no bruits, no rebound, no guarding, no midline pulsatile mass, no hepatomegaly, no splenomegaly EXT:  2 plus pulses throughout, no edema, no cyanosis no clubbing    EKG:  EKG is  ordered today. Sinus rhythm, rate 61, axis within normal limits, intervals within normal limits, mild thoracic criteria for left ventricular perjury, no acute ST-T wave changes or repolarization changes.  Recent Labs: 12/14/2020: ALT 35; BUN 25; Creatinine, Ser 1.48; Hemoglobin 13.4; Platelets 247; Potassium 3.5; Sodium 139;  TSH 1.450    Lipid Panel    Component Value Date/Time   CHOL 197 12/14/2020 1329   TRIG 163 (H) 12/14/2020 1329   HDL 56 12/14/2020 1329   CHOLHDL 3.5 12/14/2020 1329   CHOLHDL 4.0 02/28/2020 0248   VLDL 13 02/28/2020 0248   LDLCALC 113 (H) 12/14/2020 1329      Wt Readings from Last 3 Encounters:  12/24/20 230 lb (104.3 kg)  12/14/20 227 lb 3.2 oz (103.1 kg)  10/18/20 217 lb 6.4 oz (98.6 kg)      Other studies Reviewed: Additional studies/ records that were reviewed today include: MRI, echo  Review of the above records demonstrates:  Please see elsewhere in the note.     ASSESSMENT AND PLAN:   HTN:   His blood pressure is controlled.  He will continue the meds as listed.   He can remain on the meds and he did not start a clonidine patch which was suggested but his blood pressure has been well controlled.  LVH:    He has had an MRI and there was no suggestion of amyloid.  This is probably been because of hypertension.  I will follow this up with an echo after I see him next year.  AORTIC ANEURYSM:  This was 4.0 cm on CT.  I will follow this up with an echo after I see him next.   CKD II:   Creatinine was 1.48.  He can have this followed by his primary provider.  This is up slightly.  Current medicines are reviewed at length with the patient today.  The patient does not have concerns regarding medicines.  The following changes have been made: None  Labs/ tests ordered today include:   None  Orders Placed This Encounter  Procedures   EKG 12-Lead      Disposition:   FU with me or APP in 12   months.    Signed, Minus Breeding, MD  12/24/2020 9:03 AM    [place]

## 2020-12-24 ENCOUNTER — Other Ambulatory Visit: Payer: Self-pay

## 2020-12-24 ENCOUNTER — Ambulatory Visit (INDEPENDENT_AMBULATORY_CARE_PROVIDER_SITE_OTHER): Payer: Medicaid Other | Admitting: Cardiology

## 2020-12-24 ENCOUNTER — Encounter: Payer: Self-pay | Admitting: Cardiology

## 2020-12-24 VITALS — BP 132/68 | HR 63 | Ht 71.0 in | Wt 230.0 lb

## 2020-12-24 DIAGNOSIS — I7121 Aneurysm of the ascending aorta, without rupture: Secondary | ICD-10-CM

## 2020-12-24 DIAGNOSIS — I517 Cardiomegaly: Secondary | ICD-10-CM

## 2020-12-24 DIAGNOSIS — N182 Chronic kidney disease, stage 2 (mild): Secondary | ICD-10-CM

## 2020-12-24 NOTE — Patient Instructions (Signed)
Medication Instructions:  Your Physician recommend you continue on your current medication as directed.    *If you need a refill on your cardiac medications before your next appointment, please call your pharmacy*     Follow-Up: At [facility], you and your health needs are our priority.  As part of our continuing mission to provide you with exceptional heart care, we have created designated Provider Care Teams.  These Care Teams include your primary Cardiologist (physician) and Advanced Practice Providers (APPs -  Physician Assistants and Nurse Practitioners) who all work together to provide you with the care you need, when you need it.  We recommend signing up for the patient portal called "MyChart".  Sign up information is provided on this After Visit Summary.  MyChart is used to connect with patients for Virtual Visits (Telemedicine).  Patients are able to view lab/test results, encounter notes, upcoming appointments, etc.  Non-urgent messages can be sent to your provider as well.   To learn more about what you can do with MyChart, go to [email or web address].    Your next appointment:   1 year(s)  The format for your next appointment:   In Person  Provider:   Minus Breeding, MD

## 2021-01-23 DIAGNOSIS — I639 Cerebral infarction, unspecified: Secondary | ICD-10-CM

## 2021-01-23 HISTORY — DX: Cerebral infarction, unspecified: I63.9

## 2021-03-03 ENCOUNTER — Ambulatory Visit: Payer: Medicaid Other | Admitting: Neurology

## 2021-03-03 ENCOUNTER — Encounter: Payer: Self-pay | Admitting: Neurology

## 2021-03-03 VITALS — BP 108/72 | HR 64 | Ht 72.0 in | Wt 228.0 lb

## 2021-03-03 DIAGNOSIS — R634 Abnormal weight loss: Secondary | ICD-10-CM | POA: Diagnosis not present

## 2021-03-03 DIAGNOSIS — Z8673 Personal history of transient ischemic attack (TIA), and cerebral infarction without residual deficits: Secondary | ICD-10-CM | POA: Diagnosis not present

## 2021-03-03 DIAGNOSIS — E669 Obesity, unspecified: Secondary | ICD-10-CM | POA: Diagnosis not present

## 2021-03-03 NOTE — Patient Instructions (Signed)
°  It was nice to meet you today!   I understand, that you want to hold off on sleep testing.    Based on your symptoms and your exam I believe you may be at some risk for obstructive sleep apnea (aka OSA), and I think we should proceed with a sleep study to determine whether you do or do not have OSA and how severe it is. Even, if you have mild OSA, I may want you to consider treatment with CPAP, as treatment of even borderline or mild sleep apnea can result and improvement of symptoms such as sleep disruption, daytime sleepiness, nighttime bathroom breaks, restless leg symptoms, improvement of headache syndromes, even improved mood disorder.   As explained, an attended sleep study (meaning you get to stay overnight in the sleep lab), lets [place] monitor sleep-related behaviors such as sleep talking and leg movements in sleep, in addition to monitoring for sleep apnea.  A home sleep test is a screening tool for sleep apnea diagnosis only, but unfortunately, does not help with any other sleep-related diagnoses.  Please remember, the long-term risks and ramifications of untreated moderate to severe obstructive sleep apnea may include (but are not limited to): increased risk for cardiovascular disease, including congestive heart failure, stroke, difficult to control hypertension, treatment resistant obesity, arrhythmias, especially irregular heartbeat commonly known as A. Fib. (atrial fibrillation); even type 2 diabetes has been linked to untreated OSA.  Other correlations that untreated obstructive sleep apnea include macular edema which is swelling of the retina in the eyes, droopy eyelid syndrome, and elevated hemoglobin and hematocrit levels (often referred to as polycythemia).  Sleep apnea can cause disruption of sleep and sleep deprivation in most cases, which, in turn, can cause recurrent headaches, problems with memory, mood, concentration, focus, and vigilance. Most people with untreated sleep apnea  report excessive daytime sleepiness, which can affect their ability to drive. Please do not drive if you feel sleepy. Patients with sleep apnea can also develop difficulty initiating and maintaining sleep (aka insomnia).   Having sleep apnea may increase your risk for other sleep disorders, including involuntary behaviors sleep such as sleep terrors, sleep talking, sleepwalking.    Having sleep apnea can also increase your risk for restless leg syndrome and leg movements at night.   Please note that untreated obstructive sleep apnea may carry additional perioperative morbidity. Patients with significant obstructive sleep apnea (typically, in the moderate to severe degree) should receive, if possible, perioperative PAP (positive airway pressure) therapy and the surgeons and particularly the anesthesiologists should be informed of the diagnosis and the severity of the sleep disordered breathing.   Please follow up with your primary care, and other providers as scheduled.  Call us if you would like to proceed with sleep testing.

## 2021-03-03 NOTE — Progress Notes (Signed)
Subjective:    Patient ID: Curtis Saunders is a 66 y.o. male.  HPI    Star Age, MD, PhD [facility] [address], [address]O. [place], [place] [address]  Dear Curtis Saunders and Curtis Saunders,   I saw your patient, Curtis Saunders, upon your kind request, in my sleep clinic today for initial consultation of his sleep disorder, in particular, concern for underlying obstructive sleep apnea.  The patient is unaccompanied today.  As you know, Curtis Saunders is a 66 year old right-handed gentleman with an underlying medical history of stroke, hypertension, hyperlipidemia, chronic kidney disease, left ventricular hypertrophy, aortic aneurysm, and obesity, who reports no significant sleep related complaints.  He denies snoring and denies any daytime somnolence.  He reports that he had some daytime tiredness but it improved.  He denies waking up with a sense of gasping for air or witnessed apneas.  He feels well rested, Epworth sleepiness score is 0 out of 24, fatigue severity score is 9 out of 63.  He has lost weight, he is working on weight loss.  He lives with his girlfriend who does not report any snoring.  He has no children.  They have 1 dog in the household.  Bedtime is generally between 830 and 10 PM and rise time around 7 AM.  He has nocturia once per average night and denies any recurrent morning headaches or family history of sleep apnea.  He does not drink any coffee, he drinks caffeine occasionally in the form of tea or soda.  He drinks alcohol rarely, none in 2 years.  He is a non-smoker.  He is retired from [employer] work. I reviewed your office note from 12/14/2020.   His Past Medical History Is Significant For: Past Medical History:  Diagnosis Date   Hypertension    Stroke [facility])     His Past Surgical History Is Significant For: Past Surgical History:  Procedure Laterality Date   APPENDECTOMY     EYE SURGERY     HAND SURGERY     KNEE SURGERY     x3, x1 R    LIPOMA EXCISION Left 10/13/2019   Procedure: SHOULDER EXCISION LIPOMA AND ROTATOR CUFF REPAIR;  Surgeon: Tania Ade, MD;  Location: [place];  Service: Orthopedics;  Laterality: Left;   TONSILLECTOMY      His Family History Is Significant For: Family History  Problem Relation Age of Onset   Heart attack Mother 36   COPD Mother    Emphysema Mother    Hypertension Sister    Sleep apnea Neg Hx     His Social History Is Significant For: Social History   Socioeconomic History   Marital status: Single    Spouse name: Not on file   Number of children: Not on file   Years of education: Not on file   Highest education level: Not on file  Occupational History   Not on file  Tobacco Use   Smoking status: Never   Smokeless tobacco: Never  Vaping Use   Vaping Use: Never used  Substance and Sexual Activity   Alcohol use: Never   Drug use: No   Sexual activity: Yes  Other Topics Concern   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     Social Determinants of Health   Financial Resource Strain: Not on file  Food Insecurity: Not on file  Transportation Needs: Not on file  Physical Activity: Not  on file  Stress: Not on file  Social Connections: Not on file    His Allergies Are:  Allergies  Allergen Reactions   Bee Venom Anaphylaxis  :   His Current Medications Are:  Outpatient Encounter Medications as of 03/03/2021  Medication Sig   amLODipine (NORVASC) 10 MG tablet Take 1 tablet (10 mg total) by mouth daily.   aspirin EC 81 MG EC tablet Take 1 tablet (81 mg total) by mouth daily. Swallow whole.   ramipril (ALTACE) 10 MG capsule Take 1 capsule (10 mg total) by mouth daily.   rosuvastatin (CRESTOR) 20 MG tablet Take 1 tablet (20 mg total) by mouth daily.   chlorthalidone (HYGROTON) 25 MG tablet Take 1 tablet (25 mg total) by mouth daily.   No facility-administered encounter medications on file as of 03/03/2021.   : Review of Systems:  Out of a complete 14 point review of systems, all are reviewed and negative with the exception of these symptoms as listed below:  Review of Systems  Neurological:        Pt is here for sleep consult . Pt states he has hypertension. Pt denies snoring, headaches in am, fatigue, sleep study, and CPAP machine   ESS FSS   Objective:  Neurological Exam  Physical Exam Physical Examination:   Vitals:   03/03/21 1504  BP: 108/72  Pulse: 64    General Examination: The patient is a very pleasant 66 y.o. male in no acute distress. He appears well-developed and well-nourished and well groomed.   HEENT: Normocephalic, atraumatic, pupils are equal, round and reactive to light, extraocular tracking is good without limitation to gaze excursion or nystagmus noted. Hearing is grossly intact. Face is symmetric with normal facial animation. Speech is clear with no dysarthria noted. There is no hypophonia. There is no lip, neck/head, jaw or voice tremor. Neck is supple with full range of passive and active motion. There are no carotid bruits on auscultation. Oropharynx exam reveals: mild mouth dryness, adequate dental hygiene with several teeth missing.  He has a mild to moderate overbite.  Tonsils absent, Mallampati class I, neck circumference of 16-1/4 inches.  Tongue protrudes centrally and palate elevates symmetrically.   Chest: Clear to auscultation without wheezing, rhonchi or crackles noted.  Heart: S1+S2+0, regular and normal without murmurs, rubs or gallops noted.   Abdomen: Soft, non-tender and non-distended.  Extremities: There is no pitting edema in the distal lower extremities bilaterally.   Skin: Warm and dry without trophic changes noted.   Musculoskeletal: exam reveals no obvious joint deformities.   Neurologically:  Mental status: The patient is awake, alert and oriented in all 4 spheres. His immediate and remote memory, attention, language skills and fund  of knowledge are appropriate. There is no evidence of aphasia, agnosia, apraxia or anomia. Speech is clear with normal prosody and enunciation. Thought process is linear. Mood is normal and affect is normal.  Cranial nerves II - XII are as described above under HEENT exam.  Motor exam: Normal bulk, strength and tone is noted. There is no tremor. Fine motor skills and coordination: grossly intact.  Cerebellar testing: No dysmetria or intention tremor. There is no truncal or gait ataxia.  Sensory exam: intact to light touch in the upper and lower extremities.  Gait, station and balance: He stands easily. No veering to one side is noted. No leaning to one side is noted. Posture is age-appropriate and stance is narrow based. Gait shows normal stride length and normal pace.  No problems turning are noted.   Assessment and Plan:  In summary, Greer Koeppen Bonenberger is a very pleasant 66 y.o.-year old male with an underlying medical history of stroke, hypertension, hyperlipidemia, chronic kidney disease, left ventricular hypertrophy, aortic aneurysm, and obesity, who may be at risk for underlying obstructive sleep apnea but declined sleep testing at this time.  I talked to the patient about the diagnosis of OSA, its prognosis and treatment options.  I explained sleep testing to him and outlined the differences between a laboratory attended sleep study versus home sleep test.  He declines sleep testing and feels he does not need to be tested at this moment.  He is encouraged to call us back if he changes his mind.  I talked to him about the risks and ramifications of untreated moderate to severe OSA, especially with respect to developing cardiovascular disease down the Road, including congestive heart failure, difficult to treat hypertension, cardiac arrhythmias, or stroke. Even type 2 diabetes has, in part, been linked to untreated OSA. Symptoms of untreated OSA include daytime sleepiness, memory problems, mood  irritability and mood disorder such as depression and anxiety, lack of energy, as well as recurrent headaches, especially morning headaches.  He is encouraged to continue to maintain a healthy lifestyle and continue to work on weight loss.  He is advised to follow-up with you as scheduled and with his other providers.   I answered all his questions today and the patient was in agreement. Thank you very much for allowing me to participate in the care of this nice patient. If I can be of any further assistance to you please do not hesitate to talk to me.  Sincerely,   Star Age, MD, PhD

## 2021-05-23 NOTE — Progress Notes (Signed)
?  ?Cardiology Office Note ? ? ?Date:  05/24/2021  ? ?ID:  Curtis Saunders, DOB 1955-05-18, MRN [id number] ? ?PCP:  Pcp, No  ?Cardiologist:   Minus Breeding, MD ?Referring:  ED provider ? ?Chief Complaint  ?Patient presents with  ? Hypertension  ? ? ? ?  ?History of Present Illness: ?Curtis Saunders is a 66 y.o. male who was referred by ED for evaluation of chest pain.  He was in the ED in late Jan 2021 for chest pain.    There was no evidence objective evidence of ischemia.   The patient had moderate LVH on echo.  He was noted there to have negative cardiac enzymes.  CT of the chest demonstrated no acute abnormalities.  Apparently because he had headaches he also had a CT of his head which demonstrated no acute abnormalities.  He was treated with morphine, ketorolac and nitroglycerin.    After seeing me I sent him for a coronary CTA and he had no coronary calcium and NL coronaries.  He does have an ascending aortic aneurysm.   In Feb 2022 he was hospitalized with acute CVA.  There was an MRI with posterior limb internal capsule acute infarct.  Echo demonstrated severe LVH.  He was treated with ASA and Plavix.    ? ?Since he was last seen he has done well.  The patient denies any new symptoms such as chest discomfort, neck or arm discomfort. There has been no new shortness of breath, PND or orthopnea. There have been no reported palpitations, presyncope or syncope.    ? ? ?Past Medical History:  ?Diagnosis Date  ? Hypertension   ? Stroke [facility])   ? ? ?Past Surgical History:  ?Procedure Laterality Date  ? APPENDECTOMY    ? EYE SURGERY    ? HAND SURGERY    ? KNEE SURGERY    ? x3, x1 R  ? LIPOMA EXCISION Left 10/13/2019  ? Procedure: SHOULDER EXCISION LIPOMA AND ROTATOR CUFF REPAIR;  Surgeon: Tania Ade, MD;  Location: [place];  Service: Orthopedics;  Laterality: Left;  ? TONSILLECTOMY    ? ? ? ?Current Outpatient Medications  ?Medication Sig Dispense Refill  ? amLODipine (NORVASC) 10 MG tablet  Take 1 tablet (10 mg total) by mouth daily. 90 tablet 3  ? aspirin EC 81 MG EC tablet Take 1 tablet (81 mg total) by mouth daily. Swallow whole. 30 tablet 11  ? ramipril (ALTACE) 10 MG capsule Take 1 capsule (10 mg total) by mouth daily. 90 capsule 3  ? rosuvastatin (CRESTOR) 20 MG tablet Take 1 tablet (20 mg total) by mouth daily. 90 tablet 3  ? chlorthalidone (HYGROTON) 25 MG tablet Take 1 tablet (25 mg total) by mouth daily. 90 tablet 3  ? ?No current facility-administered medications for this visit.  ? ? ?Allergies:   Bee venom  ? ?ROS:  Please see the history of present illness.   Otherwise, review of systems are positive for none.   All other systems are reviewed and negative.  ? ? ?PHYSICAL EXAM: ?VS:  BP 124/80   Pulse 62   Ht '5\' 11"'$  (1.803 m)   Wt 227 lb 3.2 oz (103.1 kg)   SpO2 95%   BMI 31.69 kg/m?  , BMI Body mass index is 31.69 kg/m?.  ?GENERAL:  Well appearing ?NECK:  No jugular venous distention, waveform within normal limits, carotid upstroke brisk and symmetric, no bruits, no thyromegaly ?LUNGS:  Clear to auscultation bilaterally ?  CHEST:  Unremarkable ?HEART:  PMI not displaced or sustained,S1 and S2 within normal limits, no S3, no S4, no clicks, no rubs, no murmurs ?ABD:  Flat, positive bowel sounds normal in frequency in pitch, no bruits, no rebound, no guarding, no midline pulsatile mass, no hepatomegaly, no splenomegaly ?EXT:  2 plus pulses throughout, no edema, no cyanosis no clubbing ? ? ?EKG:  EKG is  ordered today. ?Sinus rhythm, rate 62, axis within normal limits, intervals within normal limits, mild thoracic criteria for left ventricular perjury, no acute ST-T wave changes or repolarization changes. ? ?Recent Labs: ?12/14/2020: ALT 35; BUN 25; Creatinine, Ser 1.48; Hemoglobin 13.4; Platelets 247; Potassium 3.5; Sodium 139; TSH 1.450  ? ? ?Lipid Panel ?   ?Component Value Date/Time  ? CHOL 197 12/14/2020 1329  ? TRIG 163 (H) 12/14/2020 1329  ? HDL 56 12/14/2020 1329  ? CHOLHDL 3.5  12/14/2020 1329  ? CHOLHDL 4.0 02/28/2020 0248  ? VLDL 13 02/28/2020 0248  ? LDLCALC 113 (H) 12/14/2020 1329  ? ?  ? ?Wt Readings from Last 3 Encounters:  ?05/24/21 227 lb 3.2 oz (103.1 kg)  ?03/03/21 228 lb (103.4 kg)  ?12/24/20 230 lb (104.3 kg)  ?  ? ? ?Other studies Reviewed: ?Additional studies/ records that were reviewed today include: Labs ? ?Review of the above records demonstrates:  Please see elsewhere in the note.   ? ? ?ASSESSMENT AND PLAN: ? ? ?HTN:      The blood pressure is at target. No change in medications is indicated. We will continue with therapeutic lifestyle changes (TLC). ? ?LVH:    He has had an MRI and there was no suggestion of amyloid.   No change in therapy.  ? ?AORTIC ANEURYSM:  This was 4.0 cm on CT.   This was not prominent on MRI.  I will follow this clinically.  ? ?CKD II:   Creatinine was 1.48 and I will  check a BMET today. ? ?Current medicines are reviewed at length with the patient today.  The patient does not have concerns regarding medicines. ? ?The following changes have been made: None ? ?Labs/ tests ordered today include:   None ? ?Orders Placed This Encounter  ?Procedures  ? Basic metabolic panel  ? EKG 12-Lead  ? ? ? ? ?Disposition:   FU with me or 12 months.  ? ? ?Signed, ?Minus Breeding, MD  ?05/24/2021 2:11 PM    ?[place] ?

## 2021-05-24 ENCOUNTER — Encounter: Payer: Self-pay | Admitting: Cardiology

## 2021-05-24 ENCOUNTER — Ambulatory Visit (INDEPENDENT_AMBULATORY_CARE_PROVIDER_SITE_OTHER): Payer: Medicaid Other | Admitting: Cardiology

## 2021-05-24 VITALS — BP 124/80 | HR 62 | Ht 71.0 in | Wt 227.2 lb

## 2021-05-24 DIAGNOSIS — I10 Essential (primary) hypertension: Secondary | ICD-10-CM

## 2021-05-24 DIAGNOSIS — I517 Cardiomegaly: Secondary | ICD-10-CM

## 2021-05-24 DIAGNOSIS — I7121 Aneurysm of the ascending aorta, without rupture: Secondary | ICD-10-CM

## 2021-05-24 DIAGNOSIS — N182 Chronic kidney disease, stage 2 (mild): Secondary | ICD-10-CM | POA: Diagnosis not present

## 2021-05-24 NOTE — Patient Instructions (Addendum)
Medication Instructions:  ?Your physician recommends that you continue on your current medications as directed. Please refer to the Current Medication list given to you today. ? ?*If you need a refill on your cardiac medications before your next appointment, please call your pharmacy* ? ? ?Lab Work: ?Your physician recommends that you have labs drawn today: BMET ? ?If you have labs (blood work) drawn today and your tests are completely normal, you will receive your results only by: ?MyChart Message (if you have MyChart) OR ?A paper copy in the mail ?If you have any lab test that is abnormal or we need to change your treatment, we will call you to review the results. ? ? ?Follow-Up: ?At [facility], you and your health needs are our priority.  As part of our continuing mission to provide you with exceptional heart care, we have created designated Provider Care Teams.  These Care Teams include your primary Cardiologist (physician) and Advanced Practice Providers (APPs -  Physician Assistants and Nurse Practitioners) who all work together to provide you with the care you need, when you need it. ? ?We recommend signing up for the patient portal called "MyChart".  Sign up information is provided on this After Visit Summary.  MyChart is used to connect with patients for Virtual Visits (Telemedicine).  Patients are able to view lab/test results, encounter notes, upcoming appointments, etc.  Non-urgent messages can be sent to your provider as well.   ?To learn more about what you can do with MyChart, go to [email or web address].   ? ?Your next appointment:   ?12 month(s) ? ?The format for your next appointment:   ?In Person ? ?Provider:   ?Minus Breeding, MD  ?

## 2021-06-08 LAB — BASIC METABOLIC PANEL
BUN/Creatinine Ratio: 14 (ref 10–24)
BUN: 24 mg/dL (ref 8–27)
CO2: 26 mmol/L (ref 20–29)
Calcium: 10.1 mg/dL (ref 8.6–10.2)
Chloride: 100 mmol/L (ref 96–106)
Creatinine, Ser: 1.67 mg/dL — ABNORMAL HIGH (ref 0.76–1.27)
Glucose: 151 mg/dL — ABNORMAL HIGH (ref 70–99)
Potassium: 3.7 mmol/L (ref 3.5–5.2)
Sodium: 140 mmol/L (ref 134–144)
eGFR: 46 mL/min/{1.73_m2} — ABNORMAL LOW (ref >59)

## 2021-06-10 ENCOUNTER — Encounter: Payer: Self-pay | Admitting: *Deleted

## 2021-06-10 ENCOUNTER — Other Ambulatory Visit: Payer: Self-pay | Admitting: *Deleted

## 2021-06-10 DIAGNOSIS — N182 Chronic kidney disease, stage 2 (mild): Secondary | ICD-10-CM

## 2021-06-15 ENCOUNTER — Ambulatory Visit: Payer: Medicaid Other | Admitting: Adult Health

## 2021-06-17 ENCOUNTER — Telehealth: Payer: Self-pay | Admitting: Cardiology

## 2021-06-17 NOTE — Telephone Encounter (Signed)
Pt calling in after receiving his lab result asking him to contact us. Aware that MD wanted f/u BMET. Pt states he will stop by the office Wednesday, 5/31.

## 2021-06-17 NOTE — Telephone Encounter (Signed)
Patient would like to speak to nurse to go over his lab results.

## 2021-06-21 ENCOUNTER — Encounter: Payer: Self-pay | Admitting: Adult Health

## 2021-06-21 ENCOUNTER — Ambulatory Visit: Payer: Medicaid Other | Admitting: Adult Health

## 2021-06-21 NOTE — Progress Notes (Deleted)
[facility] [address] [place]. [place] [address] [phone]       STROKE FOLLOW UP NOTE  [name] Date of Birth:  [birth year] Medical Record Number:  [id number]   Reason for Referral: stroke follow up    SUBJECTIVE:   CHIEF COMPLAINT:  No chief complaint on file.     HPI:   Update 06/21/2021 JM: Patient returns for 49-monthstroke follow-up.  Has been stable without new or reoccurring stroke/TIA symptoms.  Compliant on aspirin and Crestor, denies side effects.  Increase Crestor dosage from 10 mg to 20 mg after prior visit for LDL at 113.  Blood pressure today ***.  Evaluated by Dr. ARexene Alberts2/2023 for possible underlying sleep apnea contributing to continued fatigue, he declined further testing/evaluation at that time. Routinely follows with cardiology.      History provided for reference purposes only Update 12/14/2020 JM: Returns for 439-monthtroke follow-up.  Overall stable -denies new or reoccurring stroke/TIA symptoms Continues to experience excessive daytime fatigue which has been present since his stroke. He reports sleeping well through the night - will occasionally wake up in the middle of the night but shortly go back to sleep. He will typically get 8-12 hrs of sleep per night. Does endorse limited to no physical activity or exercise.  Denies depression/anxiety.  Compliant on aspirin and Crestor 10 10 mg daily-denies side effects Blood pressure today 124/74 -routinely followed by cardiology  No new concerns at this time   Update 08/03/2020 JM: [name] for 4-8-monthroke follow-up.  Doing well since prior visit without new or reoccurring stroke/TIA symptoms.  He does report fatigue which has been present since his stroke which persist throughout the day and can worsen with activity.  He does admit to being sedentary with little to no physical activity during the day.  Denies any difficulty sleeping, snoring or witnessed  apnea.  Denies any depression type symptoms or concerns.  Compliant on aspirin without associated side effects.  Lipid panel at prior visit showed LDL 100 therefore advised to restart atorvastatin 40 mg daily -he reports taking for only short duration then self discontinued due to constipation.  Blood pressure today 144/90.  No further concerns at this time.  Initial visit 04/01/2020 JM: [name] being seen for hospital follow-up unaccompanied  Reports complete recovery since discharge He has returned back to all prior activities without difficulty Denies new stroke/TIA symptoms  Completed 3 weeks DAPT and remains on aspirin alone -denies side effects Not currently on atorvastatin -thought he only needed to take for 30 days but denies side effects while taking consistently Blood pressure today 156/95 -recently switched from rampiril to valsartan but apparently blood pressure became elevated with SBP 190-200s therefore he self discontinued valsartan and restarted taking Rampiril -blood pressures have been more consistently 150s/80s  No concerns at this time  Stroke admission 02/27/2020 [name] a 64 7o. male with history of HTN  presented on 02/27/2020 with slurred speech and right facial droop.  Personally reviewed hospitalization pertinent progress notes, lab work and imaging with summary provided.  Evaluated by Dr. 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  Recommended cardiology consult for further evaluation.  Recommended DAPT for 3 weeks and aspirin alone.  LDL 108 -initiate atorvastatin 40 mg daily.  A1c 6.2.  Other stroke risk factors include prior  stroke on imaging, family history of stroke and Covid 19 infection 02/12/2020.  Residual deficits mild dysarthria and right facial droop.  Evaluated by therapies and recommended outpatient SLP and  discharged home in stable condition.  Stroke - Infarct in the PLIC likely due to small vessel disease  MRI - 9 mm acute infarct centered within the posterior limb of left internal capsule. This infarct may also involve the adjacent left  thalamus and lentiform nucleus.Two chronic small-vessel infarcts within the left corona radiata/basal ganglia, new as compared to the head CT of 02/20/2019. Redemonstrated chronic lacunar infarct within the right corona radiata.Background mild cerebral white matter chronic small vessel ischemic disease. CTA head & neck No large vessel occlusion, significant stenosis, or aneurysm in the head and neck. Small left upper lobe pulmonary nodules measuring up to 4 mm. No follow-up needed if patient is low-risk, Diffusely enlarged thyroid with a possible 1.5 cm nodule in the left lobe. 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  EF 65 to 70% LDL 108 HgbA1c 6.2 VTE prophylaxis -none None prior to admission, now on aspirin 325 mg daily. Can change to aspirin 81 mg and Plavix 75  for 3 weeks and aspirin alone Therapy recommendations:  OP SLP Disposition:  Home      ROS:   14 system review of systems performed and negative with exception of fatigue  PMH:  Past Medical History:  Diagnosis Date   Hypertension    Stroke ([place])     PSH:  Past Surgical History:  Procedure Laterality Date   APPENDECTOMY     EYE SURGERY     HAND SURGERY     KNEE SURGERY     x3, x1 R   LIPOMA EXCISION Left 10/13/2019   Procedure: SHOULDER EXCISION LIPOMA AND ROTATOR CUFF REPAIR;  Surgeon: Tania Ade, MD;  Location: [facility];  Service: Orthopedics;  Laterality: Left;   TONSILLECTOMY      Social History:  Social History   Socioeconomic History   Marital status: Single    Spouse name: Not on file   Number of children: Not on file   Years of education: Not on file   Highest education level: Not on file  Occupational History    Not on file  Tobacco Use   Smoking status: Never   Smokeless tobacco: Never  Vaping Use   Vaping Use: Never used  Substance and Sexual Activity   Alcohol use: Never   Drug use: No   Sexual activity: Yes  Other Topics Concern   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     Social Determinants of Health   Financial Resource Strain: Not on file  Food Insecurity: Not on file  Transportation Needs: Not on file  Physical Activity: Not on file  Stress: Not on file  Social Connections: Not on file  Intimate Partner Violence: Not on file    Family History:  Family History  Problem Relation Age of Onset   Heart attack Mother 73   COPD Mother    Emphysema Mother    Hypertension Sister    Sleep apnea Neg Hx     Medications:   Current Outpatient Medications on File Prior to Visit  Medication Sig Dispense Refill   amLODipine (NORVASC) 10 MG tablet Take 1 tablet (10 mg total) by mouth daily. 90 tablet 3   aspirin EC 81 MG EC tablet Take 1 tablet (81 mg total)  by mouth daily. Swallow whole. 30 tablet 11   chlorthalidone (HYGROTON) 25 MG tablet Take 1 tablet (25 mg total) by mouth daily. 90 tablet 3   ramipril (ALTACE) 10 MG capsule Take 1 capsule (10 mg total) by mouth daily. 90 capsule 3   rosuvastatin (CRESTOR) 20 MG tablet Take 1 tablet (20 mg total) by mouth daily. 90 tablet 3   No current facility-administered medications on file prior to visit.    Allergies:   Allergies  Allergen Reactions   Bee Venom Anaphylaxis      OBJECTIVE:  Physical Exam  There were no vitals filed for this visit.    There is no height or weight on file to calculate BMI. No results found.  General: well developed, well nourished,  pleasant middle-aged African-American male, seated, in no evident distress Head: head normocephalic and atraumatic.   Neck: supple with no carotid or supraclavicular bruits Cardiovascular: regular rate and rhythm, no  murmurs Musculoskeletal: no deformity Skin:  no rash/petichiae Vascular:  Normal pulses all extremities   Neurologic Exam Mental Status: Awake and fully alert.  Fluent speech and language.  Oriented to place and time. Recent and remote memory intact. Attention span, concentration and fund of knowledge appropriate. Mood and affect appropriate.  Cranial Nerves: Pupils equal, briskly reactive to light. Extraocular movements full without nystagmus. Visual fields full to confrontation. Hearing intact. Facial sensation intact. Face, tongue, palate moves normally and symmetrically.  Motor: Normal bulk and tone. Normal strength in all tested extremity muscles Sensory.: intact to touch , pinprick , position and vibratory sensation.  Coordination: Rapid alternating movements normal in all extremities. Finger-to-nose and heel-to-shin performed accurately bilaterally. Gait and Station: Arises from chair without difficulty. Stance is normal. Gait demonstrates normal stride length and balance without use of assistive device. Tandem walk and heel toe with mild difficulty.  Reflexes: 1+ and symmetric. Toes downgoing.        ASSESSMENT: Curtis Saunders is a 66 y.o. 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 Vascular risk factors include prior stroke on imaging, HTN, HLD, prediabetes and cardiomyopathy.     PLAN:  L PLIC stroke :  Recovered well without residual focal deficits although does c/o fatigue since his stroke - will obtain lab work to rule out reversible causes.  Referral also placed to [place] sleep clinic for further evaluation of possible underlying sleep apnea.  Encouraged slowly increasing daily activity and ensuring routine physical exercise as some fatigue may be in setting of sedentary lifestyle. Continue aspirin 81 mg daily and Crestor '10mg'$  daily for secondary stroke  prevention.  Discussed secondary stroke prevention measures and importance of establishing care with PCP (again discussed) follow up for aggressive stroke risk factor management including BP goal<130/90, and HLD with LDL goal<70  Cardiomyopathy: Routine follow-up with cardiology Thyroid nodule: CTA 02/2020 diffusely enlarged thyroid with a possible 1.5 cm nodule in the left lobe.  Obtain US thyroid for f/u testing.     Follow up in 6 months or call earlier if needed     I spent 31 minutes of face-to-face and non-face-to-face time with patient.  This included previsit chart review, lab review, study review, order entry, electronic health record documentation, patient education regarding prior stroke and etiology,  secondary stroke prevention measures and importance of managing stroke risk factors, continued daytime fatigue and possible etiologies and further evaluation and answered all  other questions to patient satisfaction  Frann Rider, [facility]  [facility] [address] [place], [place] [address]  Phone [phone] Fax [phone] Note: This document was prepared with digital dictation and possible smart phrase technology. Any transcriptional errors that result from this process are unintentional.

## 2021-06-22 LAB — BASIC METABOLIC PANEL
BUN/Creatinine Ratio: 18 (ref 10–24)
BUN: 31 mg/dL — ABNORMAL HIGH (ref 8–27)
CO2: 26 mmol/L (ref 20–29)
Calcium: 9.9 mg/dL (ref 8.6–10.2)
Chloride: 102 mmol/L (ref 96–106)
Creatinine, Ser: 1.74 mg/dL — ABNORMAL HIGH (ref 0.76–1.27)
Glucose: 111 mg/dL — ABNORMAL HIGH (ref 70–99)
Potassium: 3.9 mmol/L (ref 3.5–5.2)
Sodium: 141 mmol/L (ref 134–144)
eGFR: 44 mL/min/{1.73_m2} — ABNORMAL LOW (ref >59)

## 2021-06-23 ENCOUNTER — Encounter: Payer: Self-pay | Admitting: Adult Health

## 2021-06-23 ENCOUNTER — Encounter: Payer: Self-pay | Admitting: *Deleted

## 2021-06-23 ENCOUNTER — Ambulatory Visit: Payer: Medicaid Other | Admitting: Adult Health

## 2021-06-23 VITALS — BP 129/87 | HR 55 | Ht 72.0 in | Wt 227.0 lb

## 2021-06-23 DIAGNOSIS — Z8673 Personal history of transient ischemic attack (TIA), and cerebral infarction without residual deficits: Secondary | ICD-10-CM

## 2021-06-23 DIAGNOSIS — E041 Nontoxic single thyroid nodule: Secondary | ICD-10-CM

## 2021-06-23 DIAGNOSIS — R7303 Prediabetes: Secondary | ICD-10-CM | POA: Diagnosis not present

## 2021-06-23 DIAGNOSIS — E785 Hyperlipidemia, unspecified: Secondary | ICD-10-CM

## 2021-06-23 NOTE — Progress Notes (Signed)
[facility] [address] [place]. [place] [address] [phone]       STROKE FOLLOW UP NOTE  Curtis Saunders Date of Birth:  1956/01/01 Medical Record Number:  [id number]   Reason for Referral: stroke follow up    SUBJECTIVE:   CHIEF COMPLAINT:  Chief Complaint  Patient presents with   Follow-up    Rm 3 alone- reports he has been doing well. No complaints       HPI:   Update 06/23/2021 JM: Patient returns for 38-monthstroke follow-up.  Has been stable without new or reoccurring stroke/TIA symptoms.  Compliant on aspirin and Crestor, denies side effects.  Increase Crestor dosage from 10 mg to 20 mg after prior visit for LDL at 113.  Blood pressure today 129/87.  Evaluated by Dr. ARexene Saunders/2023 for possible underlying sleep apnea contributing to continued fatigue, he declined further testing/evaluation at that time.  He denies any continued issues with fatigue or sleeping related concerns.  Routinely follows with cardiology.  He has not yet established care with PCP.  No new concerns at this time.    History provided for reference purposes only Update 12/14/2020 JM: Returns for 455-monthtroke follow-up.  Overall stable -denies new or reoccurring stroke/TIA symptoms Continues to experience excessive daytime fatigue which has been present since his stroke. He reports sleeping well through the night - will occasionally wake up in the middle of the night but shortly go back to sleep. He will typically get 8-12 hrs of sleep per night. Does endorse limited to no physical activity or exercise.  Denies depression/anxiety.  Compliant on aspirin and Crestor 10 10 mg daily-denies side effects Blood pressure today 124/74 -routinely followed by cardiology  No new concerns at this time   Update 08/03/2020 JM: Curtis Saunders for 4-19-monthroke follow-up.  Doing well since prior visit without new or reoccurring stroke/TIA symptoms.  He does report fatigue which has  been present since his stroke which persist throughout the day and can worsen with activity.  He does admit to being sedentary with little to no physical activity during the day.  Denies any difficulty sleeping, snoring or witnessed apnea.  Denies any depression type symptoms or concerns.  Compliant on aspirin without associated side effects.  Lipid panel at prior visit showed LDL 100 therefore advised to restart atorvastatin 40 mg daily -he reports taking for only short duration then self discontinued due to constipation.  Blood pressure today 144/90.  No further concerns at this time.  Initial visit 04/01/2020 JM: Curtis Saunders being seen for hospital follow-up unaccompanied  Reports complete recovery since discharge He has returned back to all prior activities without difficulty Denies new stroke/TIA symptoms  Completed 3 weeks DAPT and remains on aspirin alone -denies side effects Not currently on atorvastatin -thought he only needed to take for 30 days but denies side effects while taking consistently Blood pressure today 156/95 -recently switched from rampiril to valsartan but apparently blood pressure became elevated with SBP 190-200s therefore he self discontinued valsartan and restarted taking Rampiril -blood pressures have been more consistently 150s/80s  No concerns at this time  Stroke admission 02/27/2020 Curtis Saunders a 64 4o. male with history of HTN  presented on 02/27/2020 with slurred speech and right facial droop.  Personally reviewed hospitalization pertinent progress notes, lab work and imaging with summary provided.  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  Recommended cardiology consult for further evaluation.  Recommended DAPT for 3 weeks and aspirin alone.  LDL 108 -initiate atorvastatin 40 mg daily.  A1c 6.2.   Other stroke risk factors include prior stroke on imaging, family history of stroke and Covid 19 infection 02/12/2020.  Residual deficits mild dysarthria and right facial droop.  Evaluated by therapies and recommended outpatient SLP and discharged home in stable condition.  Stroke - Infarct in the PLIC likely due to small vessel disease  MRI - 9 mm acute infarct centered within the posterior limb of left internal capsule. This infarct may also involve the adjacent left  thalamus and lentiform nucleus.Two chronic small-vessel infarcts within the left corona radiata/basal ganglia, new as compared to the head CT of 02/20/2019. Redemonstrated chronic lacunar infarct within the right corona radiata.Background mild cerebral white matter chronic small vessel ischemic disease. CTA head & neck No large vessel occlusion, significant stenosis, or aneurysm in the head and neck. Small left upper lobe pulmonary nodules measuring up to 4 mm. No follow-up needed if patient is low-risk, Diffusely enlarged thyroid with a possible 1.5 cm nodule in the left lobe. 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  EF 65 to 70% LDL 108 HgbA1c 6.2 VTE prophylaxis -none None prior to admission, now on aspirin 325 mg daily. Can change to aspirin 81 mg and Plavix 75  for 3 weeks and aspirin alone Therapy recommendations:  OP SLP Disposition:  Home      ROS:   14 system review of systems performed and negative with exception of those listed in HPI  PMH:  Past Medical History:  Diagnosis Date   Hypertension    Stroke ([place])     PSH:  Past Surgical History:  Procedure Laterality Date   APPENDECTOMY     EYE SURGERY     HAND SURGERY     KNEE SURGERY     x3, x1 R   LIPOMA EXCISION Left 10/13/2019   Procedure: SHOULDER EXCISION LIPOMA AND ROTATOR CUFF REPAIR;  Surgeon: Tania Ade, MD;  Location: [facility];  Service: Orthopedics;  Laterality: Left;   TONSILLECTOMY       Social History:  Social History   Socioeconomic History   Marital status: Single    Spouse name: Not on file   Number of children: Not on file   Years of education: Not on file   Highest education level: Not on file  Occupational History   Not on file  Tobacco Use   Smoking status: Never   Smokeless tobacco: Never  Vaping Use   Vaping Use: Never used  Substance and Sexual Activity   Alcohol use: Never   Drug use: No   Sexual activity: Yes  Other Topics Concern   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     Social Determinants of Health   Financial Resource Strain: Not on file  Food Insecurity: Not on file  Transportation Needs: Not on file  Physical Activity: Not on file  Stress: Not on file  Social Connections: Not on file  Intimate Partner Violence: Not on file    Family History:  Family History  Problem Relation Age of Onset   Heart attack Mother 18   COPD Mother    Emphysema Mother    Hypertension Sister    Sleep apnea Neg Hx  Medications:   Current Outpatient Medications on File Prior to Visit  Medication Sig Dispense Refill   amLODipine (NORVASC) 10 MG tablet Take 1 tablet (10 mg total) by mouth daily. 90 tablet 3   aspirin EC 81 MG EC tablet Take 1 tablet (81 mg total) by mouth daily. Swallow whole. 30 tablet 11   ramipril (ALTACE) 10 MG capsule Take 1 capsule (10 mg total) by mouth daily. 90 capsule 3   rosuvastatin (CRESTOR) 20 MG tablet Take 1 tablet (20 mg total) by mouth daily. 90 tablet 3   chlorthalidone (HYGROTON) 25 MG tablet Take 1 tablet (25 mg total) by mouth daily. 90 tablet 3   No current facility-administered medications on file prior to visit.    Allergies:   Allergies  Allergen Reactions   Bee Venom Anaphylaxis      OBJECTIVE:  Physical Exam  Vitals:   06/23/21 1039  BP: 129/87  Pulse: (!) 55  Weight: 227 lb (103 kg)  Height: 6' (1.829 m)   Body mass index is 30.79  kg/m. No results found.   General: well developed, well nourished,  pleasant middle-aged African-American male, seated, in no evident distress Head: head normocephalic and atraumatic.   Neck: supple with no carotid or supraclavicular bruits Cardiovascular: regular rate and rhythm, no murmurs Musculoskeletal: no deformity Skin:  no rash/petichiae Vascular:  Normal pulses all extremities   Neurologic Exam Mental Status: Awake and fully alert.  Fluent speech and language.  Oriented to place and time. Recent and remote memory intact. Attention span, concentration and fund of knowledge appropriate. Mood and affect appropriate.  Cranial Nerves: Pupils equal, briskly reactive to light. Extraocular movements full without nystagmus. Visual fields full to confrontation. Hearing intact. Facial sensation intact. Face, tongue, palate moves normally and symmetrically.  Motor: Normal bulk and tone. Normal strength in all tested extremity muscles Sensory.: intact to touch , pinprick , position and vibratory sensation.  Coordination: Rapid alternating movements normal in all extremities. Finger-to-nose and heel-to-shin performed accurately bilaterally. Gait and Station: Arises from chair without difficulty. Stance is normal. Gait demonstrates normal stride length and balance without use of assistive device. Tandem walk and heel toe with mild difficulty.  Reflexes: 1+ and symmetric. Toes downgoing.        ASSESSMENT: Curtis Saunders is a 66 y.o. 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 Vascular risk factors include prior stroke on imaging, HTN, HLD, prediabetes and cardiomyopathy.     PLAN:  L PLIC stroke :  Recovered well without residual focal deficits  Continue aspirin 81 mg daily and Crestor '20mg'$  daily for secondary stroke prevention.  Discussed secondary stroke  prevention measures and importance of establishing care with PCP (again discussed) follow up for aggressive stroke risk factor management including BP goal<130/90, and HLD with LDL goal<70 and pre-DM with A1c goal<7.0 Stroke labs: LDL 113 (11/2020), A1c 6.2 (02/2020)  Repeat lipid panel and A1c today  Thyroid nodule: CTA 02/2020 diffusely enlarged thyroid with a possible 1.5 cm nodule in the left lobe.  Previously placed ultrasound ordered not yet completed, will replace order to ensure this is completed.     Overall stable from stroke standpoint without further recommendations.  Advised importance of establishing care with PCP for ongoing stroke risk factor management.  He was advised if he is unable to obtain PCP over the next year, we can see him back  if needed     I spent 26 minutes of face-to-face and non-face-to-face time with patient.  This included previsit chart review, lab review, study review, order entry, electronic health record documentation, patient education regarding prior stroke and etiology,  secondary stroke prevention measures and importance of managing stroke risk factors, and answered all other questions to patient satisfaction  Frann Rider, [facility]  [facility] [address] [place], [place] [address]  Phone [phone] Fax [phone] Note: This document was prepared with digital dictation and possible smart phrase technology. Any transcriptional errors that result from this process are unintentional.

## 2021-06-23 NOTE — Patient Instructions (Signed)
Continue aspirin 81 mg daily  and Crestor  for secondary stroke prevention  We will check cholesterol levels today and adjust crestor dose if needed  Would recommend you establish care with a PCP for routine follow up for management of cholesterol and blood pressure management  Maintain strict control of hypertension with blood pressure goal below 130/90, diabetes with hemoglobin A1c goal below 7.0 % and cholesterol with LDL cholesterol (bad cholesterol) goal below 70 mg/dL.   Signs of a Stroke? Follow the BEFAST method:  Balance Watch for a sudden loss of balance, trouble with coordination or vertigo Eyes Is there a sudden loss of vision in one or both eyes? Or double vision?  Face: Ask the person to smile. Does one side of the face droop or is it numb?  Arms: Ask the person to raise both arms. Does one arm drift downward? Is there weakness or numbness of a leg? Speech: Ask the person to repeat a simple phrase. Does the speech sound slurred/strange? Is the person confused ? Time: If you observe any of these signs, call 911.         Thank you for coming to see [place] at [facility]. I hope we have been able to provide you high quality care today.  You may receive a patient satisfaction survey over the next few weeks. We would appreciate your feedback and comments so that we may continue to improve ourselves and the health of our patients.

## 2021-06-24 LAB — LIPID PANEL
Chol/HDL Ratio: 2.7 ratio (ref 0.0–5.0)
Cholesterol, Total: 121 mg/dL (ref 100–199)
HDL: 45 mg/dL (ref >39)
LDL Chol Calc (NIH): 59 mg/dL (ref 0–99)
Triglycerides: 89 mg/dL (ref 0–149)
VLDL Cholesterol Cal: 17 mg/dL (ref 5–40)

## 2021-06-24 LAB — HEMOGLOBIN A1C
Est. average glucose Bld gHb Est-mCnc: 163 mg/dL
Hgb A1c MFr Bld: 7.3 % — ABNORMAL HIGH (ref 4.8–5.6)

## 2021-06-27 ENCOUNTER — Other Ambulatory Visit: Payer: Self-pay | Admitting: Adult Health

## 2021-06-27 ENCOUNTER — Telehealth: Payer: Self-pay

## 2021-06-27 DIAGNOSIS — E119 Type 2 diabetes mellitus without complications: Secondary | ICD-10-CM

## 2021-06-27 DIAGNOSIS — I10 Essential (primary) hypertension: Secondary | ICD-10-CM

## 2021-06-27 DIAGNOSIS — Z8673 Personal history of transient ischemic attack (TIA), and cerebral infarction without residual deficits: Secondary | ICD-10-CM

## 2021-06-27 DIAGNOSIS — E785 Hyperlipidemia, unspecified: Secondary | ICD-10-CM

## 2021-06-27 NOTE — Telephone Encounter (Signed)
Contacted pt, LVM rq CB 

## 2021-06-27 NOTE — Telephone Encounter (Signed)
-----   Message from Frann Rider, NP sent at 06/27/2021 10:59 AM EDT ----- Please advise patient that recent lab work shows increased A1c currently at 7.3 which is now within a diabetic range.  I will place an order to establish care with a primary care provider for further management.  Please have him call our office if he does not receive a phone call in the next week.  His cholesterol levels look good with LDL or bad cholesterol 59 with goal less than 70.  He will continue Crestor 20 mg daily.  Thank you.

## 2021-06-27 NOTE — Telephone Encounter (Signed)
Patient called back and reviewed NP's note in detail. Answered his questions to his statedcsatisfaction. Patient verbalized understanding, appreciation.

## 2021-06-29 ENCOUNTER — Telehealth: Payer: Self-pay | Admitting: Cardiology

## 2021-06-29 NOTE — Telephone Encounter (Signed)
Called pt. He states "I found out who it was." No further questions expressed at this time.

## 2021-06-29 NOTE — Telephone Encounter (Signed)
Patient states our office advised that he would be referred to a specialist due to elevated sugar and he is requesting an update because he has not heard from anyone.

## 2021-07-01 ENCOUNTER — Emergency Department (HOSPITAL_COMMUNITY)
Admission: EM | Admit: 2021-07-01 | Discharge: 2021-07-01 | Disposition: A | Discharge status: Home / Self Care | Payer: Medicaid Other | Attending: Emergency Medicine | Admitting: Emergency Medicine

## 2021-07-01 ENCOUNTER — Encounter (HOSPITAL_COMMUNITY): Payer: Self-pay

## 2021-07-01 DIAGNOSIS — Y92007 Garden or yard of unspecified non-institutional (private) residence as the place of occurrence of the external cause: Secondary | ICD-10-CM | POA: Diagnosis not present

## 2021-07-01 DIAGNOSIS — X58XXXA Exposure to other specified factors, initial encounter: Secondary | ICD-10-CM | POA: Diagnosis not present

## 2021-07-01 DIAGNOSIS — Z79899 Other long term (current) drug therapy: Secondary | ICD-10-CM | POA: Insufficient documentation

## 2021-07-01 DIAGNOSIS — I10 Essential (primary) hypertension: Secondary | ICD-10-CM | POA: Insufficient documentation

## 2021-07-01 DIAGNOSIS — Z7982 Long term (current) use of aspirin: Secondary | ICD-10-CM | POA: Diagnosis not present

## 2021-07-01 DIAGNOSIS — Y9389 Activity, other specified: Secondary | ICD-10-CM | POA: Diagnosis not present

## 2021-07-01 DIAGNOSIS — S39012A Strain of muscle, fascia and tendon of lower back, initial encounter: Secondary | ICD-10-CM | POA: Diagnosis not present

## 2021-07-01 DIAGNOSIS — M545 Low back pain, unspecified: Secondary | ICD-10-CM

## 2021-07-01 DIAGNOSIS — S3992XA Unspecified injury of lower back, initial encounter: Secondary | ICD-10-CM | POA: Diagnosis present

## 2021-07-01 MED ORDER — METHOCARBAMOL 500 MG PO TABS: 500.0000 mg | ORAL_TABLET | Freq: Two times a day (BID) | ORAL | 0 refills | Status: DC

## 2021-07-01 MED ORDER — HYDROCODONE-ACETAMINOPHEN 5-325 MG PO TABS: 2.0000 | ORAL_TABLET | Freq: Four times a day (QID) | ORAL | 0 refills | Status: DC | PRN

## 2021-07-01 MED ORDER — HYDROCODONE-ACETAMINOPHEN 5-325 MG PO TABS: 2.0000 | ORAL_TABLET | ORAL | 0 refills | Status: DC | PRN

## 2021-07-01 NOTE — ED Provider Notes (Signed)
[facility] DEPT Provider Note   CSN: [id number] Arrival date & time: 07/01/21  1530     History  Chief Complaint  Patient presents with   Back Pain    Curtis Saunders is a 66 y.o. male who presents to the emergency department for lower back pain. Pt states he has had pain in his lower back after working in his yard 2 days ago.  He was digging a trench using a hoe and felt a sudden pain in his left lower back.  Since then his pain has been getting significantly worse, especially with any kind of movement or getting off the couch/bed.  He has been trying Motrin, IcyHot, and a massage gun without relief.  No fevers, numbness, weakness.  No urinary retention or urine or bowel incontinence.   Back Pain Associated symptoms: no fever, no numbness and no weakness        Home Medications Prior to Admission medications   Medication Sig Start Date End Date Taking? Authorizing Provider  HYDROcodone-acetaminophen (NORCO/VICODIN) 5-325 MG tablet Take 2 tablets by mouth every 4 (four) hours as needed. 07/01/21  Yes Shaleena Crusoe T, PA-C  methocarbamol (ROBAXIN) 500 MG tablet Take 1 tablet (500 mg total) by mouth 2 (two) times daily. 07/01/21  Yes Reyhan Moronta T, PA-C  amLODipine (NORVASC) 10 MG tablet Take 1 tablet (10 mg total) by mouth daily. 08/02/20   Minus Breeding, MD  aspirin EC 81 MG EC tablet Take 1 tablet (81 mg total) by mouth daily. Swallow whole. 02/28/20   Barb Merino, MD  chlorthalidone (HYGROTON) 25 MG tablet Take 1 tablet (25 mg total) by mouth daily. 09/06/20 12/05/20  Minus Breeding, MD  ramipril (ALTACE) 10 MG capsule Take 1 capsule (10 mg total) by mouth daily. 08/02/20   Minus Breeding, MD  rosuvastatin (CRESTOR) 20 MG tablet Take 1 tablet (20 mg total) by mouth daily. 12/15/20   Frann Rider, NP      Allergies    Bee venom    Review of Systems   Review of Systems  Constitutional:  Negative for fever.  Genitourinary:  Negative for  difficulty urinating.  Musculoskeletal:  Positive for back pain.  Neurological:  Negative for weakness and numbness.  All other systems reviewed and are negative.  Physical Exam Updated Vital Signs BP (!) 152/86 (BP Location: Left Arm)   Pulse 82   Temp (!) 97.4 F (36.3 C) (Oral)   Resp 18   SpO2 99%  Physical Exam Vitals and nursing note reviewed.  Constitutional:      Appearance: Normal appearance.  HENT:     Head: Normocephalic and atraumatic.  Eyes:     Conjunctiva/sclera: Conjunctivae normal.  Pulmonary:     Effort: Pulmonary effort is normal. No respiratory distress.  Musculoskeletal:     Comments: Full passive ROM of all regions of spine.  Generalized muscular tenderness to palpation in left lumbar region.  No midline spinal tenderness, step-offs or crepitus.  Strength 5/5 in all extremities.  Sensation intact in all extremities.  Skin:    General: Skin is warm and dry.  Neurological:     Mental Status: He is alert.  Psychiatric:        Mood and Affect: Mood normal.        Behavior: Behavior normal.    ED Results / Procedures / Treatments   Labs (all labs ordered are listed, but only abnormal results are displayed) Labs Reviewed - No data to display  EKG None  Radiology No results found.  Procedures Procedures    Medications Ordered in ED Medications - No data to display  ED Course/ Medical Decision Making/ A&P                           Medical Decision Making This patient is a 66 y.o. male who presents to the ED for concern of lower back pain.   Differential diagnoses prior to evaluation: Fracture (acute/chronic), muscle strain, cauda equina, spinal stenosis, DDD, ligamentous injury, disk herniation, metastatic cancer, vertebral osteomyelitis, meningitis. This is not an exhaustive differential. No numbness, tingling, weakness, saddle anesthesia, bladder/bowel retention or incontinence to suggest cauda equina or myelopathy.   Past Medical History /  Co-morbidities / Social History: HTN  Physical Exam: Physical exam performed. The pertinent findings include: Generalized muscular tenderness to palpation in left lumbar region.  No midline spinal tenderness, step-offs or crepitus.  Strength 5/5 in all extremities.  Sensation intact in all extremities.   Disposition: After consideration of the diagnostic results and the patients response to treatment, I feel that patient is not requiring admission. Low suspicion for acute fractures due to mechanism of injury and no midline spinal tenderness. Will discharge with short course of pain medication, muscle relaxer, and recommend continuation of other symptomatic treatments. Discussed reasons to return to the emergency department, and the patient is agreeable to the plan.   Final Clinical Impression(s) / ED Diagnoses Final diagnoses:  Acute left-sided low back pain without sciatica  Strain of lumbar region, initial encounter    Rx / DC Orders ED Discharge Orders          Ordered    methocarbamol (ROBAXIN) 500 MG tablet  2 times daily        07/01/21 1611    HYDROcodone-acetaminophen (NORCO/VICODIN) 5-325 MG tablet  Every 4 hours PRN        07/01/21 1611           Portions of this report may have been transcribed using voice recognition software. Every effort was made to ensure accuracy; however, inadvertent computerized transcription errors may be present.    Estill Cotta 07/01/21 1629    Sherwood Gambler, MD 07/04/21 [phone]

## 2021-07-01 NOTE — Discharge Instructions (Addendum)
You were seen emergency department today for back pain.  I think you likely strained/pulled muscle in your lower back.  This can be incredibly painful.  I am giving you a short course of pain medication as well as a muscle relaxer.  You can take Motrin as needed on top of this.  From the drugstore you can also try using lidocaine patches or heating pad.

## 2021-07-01 NOTE — ED Triage Notes (Signed)
Pt arrived via POV, c/o lower back pain since working in yard Friday afternoon.

## 2021-07-10 ENCOUNTER — Encounter (HOSPITAL_COMMUNITY): Payer: Self-pay

## 2021-07-10 ENCOUNTER — Emergency Department (HOSPITAL_COMMUNITY)
Admission: EM | Admit: 2021-07-10 | Discharge: 2021-07-10 | Disposition: A | Discharge status: Home / Self Care | Payer: Medicaid Other | Attending: Emergency Medicine | Admitting: Emergency Medicine

## 2021-07-10 ENCOUNTER — Other Ambulatory Visit: Payer: Self-pay

## 2021-07-10 ENCOUNTER — Emergency Department (HOSPITAL_COMMUNITY): Payer: Medicaid Other

## 2021-07-10 DIAGNOSIS — X509XXA Other and unspecified overexertion or strenuous movements or postures, initial encounter: Secondary | ICD-10-CM | POA: Diagnosis not present

## 2021-07-10 DIAGNOSIS — Y92007 Garden or yard of unspecified non-institutional (private) residence as the place of occurrence of the external cause: Secondary | ICD-10-CM | POA: Insufficient documentation

## 2021-07-10 DIAGNOSIS — S3992XA Unspecified injury of lower back, initial encounter: Secondary | ICD-10-CM | POA: Diagnosis present

## 2021-07-10 DIAGNOSIS — Z7982 Long term (current) use of aspirin: Secondary | ICD-10-CM | POA: Diagnosis not present

## 2021-07-10 DIAGNOSIS — N182 Chronic kidney disease, stage 2 (mild): Secondary | ICD-10-CM | POA: Diagnosis not present

## 2021-07-10 DIAGNOSIS — S39012A Strain of muscle, fascia and tendon of lower back, initial encounter: Secondary | ICD-10-CM | POA: Insufficient documentation

## 2021-07-10 DIAGNOSIS — I10 Essential (primary) hypertension: Secondary | ICD-10-CM

## 2021-07-10 DIAGNOSIS — Z79899 Other long term (current) drug therapy: Secondary | ICD-10-CM | POA: Diagnosis not present

## 2021-07-10 DIAGNOSIS — S39012D Strain of muscle, fascia and tendon of lower back, subsequent encounter: Secondary | ICD-10-CM

## 2021-07-10 DIAGNOSIS — I129 Hypertensive chronic kidney disease with stage 1 through stage 4 chronic kidney disease, or unspecified chronic kidney disease: Secondary | ICD-10-CM | POA: Diagnosis not present

## 2021-07-10 LAB — I-STAT CHEM 8, ED
BUN: 23 mg/dL (ref 8–23)
Calcium, Ion: 1.32 mmol/L (ref 1.15–1.40)
Chloride: 102 mmol/L (ref 98–111)
Creatinine, Ser: 1.5 mg/dL — ABNORMAL HIGH (ref 0.61–1.24)
Glucose, Bld: 101 mg/dL — ABNORMAL HIGH (ref 70–99)
HCT: 40 % (ref 39.0–52.0)
Hemoglobin: 13.6 g/dL (ref 13.0–17.0)
Potassium: 3.1 mmol/L — ABNORMAL LOW (ref 3.5–5.1)
Sodium: 143 mmol/L (ref 135–145)
TCO2: 30 mmol/L (ref 22–32)

## 2021-07-10 MED ORDER — METHOCARBAMOL 500 MG PO TABS: 500.0000 mg | ORAL_TABLET | Freq: Every evening | ORAL | 0 refills | Status: DC

## 2021-07-10 MED ORDER — NAPROXEN 500 MG PO TABS: 500.0000 mg | ORAL_TABLET | Freq: Two times a day (BID) | ORAL | 0 refills | Status: DC

## 2021-07-10 MED ORDER — LIDOCAINE 5 % EX PTCH
1.0000 | MEDICATED_PATCH | Freq: Once | CUTANEOUS | Status: DC
  Administered 2021-07-10: 1 via TRANSDERMAL
  Filled 2021-07-10: qty 1

## 2021-07-10 MED ORDER — DEXAMETHASONE SODIUM PHOSPHATE 10 MG/ML IJ SOLN
10.0000 mg | Freq: Once | INTRAMUSCULAR | Status: AC
Start: 2021-07-10 — End: 2021-07-10
  Administered 2021-07-10: 10 mg via INTRAMUSCULAR
  Filled 2021-07-10: qty 1

## 2021-07-10 NOTE — Discharge Instructions (Addendum)
You have been provided the contact information for local orthopedic office.  You may try contacting them or your regular orthopedic for follow-up and reevaluation within the next 2 to 3 days.  Continue to utilize the muscle relaxants at night.  You can also been provided an anti-inflammatory medication by the name of naproxen.  You may start taking this tomorrow.  Take 1 tablet every 12 hours with plenty of food and water.  Do not take ibuprofen or Motrin at the same time, as this is in the same class of medications.  Though you may take Tylenol at the same time as the naproxen.  Return to the ED for new or worsening symptoms as discussed.

## 2021-07-10 NOTE — ED Triage Notes (Signed)
Patient c/o bilateral lower back pain and pain radiates into the left leg. Patient was seen on 07/01/21 and states the meds given to him at that time are not working.

## 2021-07-10 NOTE — ED Notes (Signed)
Patient transported to X-ray 

## 2021-07-10 NOTE — ED Provider Notes (Signed)
[facility] DEPT Provider Note   CSN: [id number] Arrival date & time: 07/10/21  1031     History {Add pertinent medical, surgical, social history, OB history to HPI:1} Chief Complaint  Patient presents with   Back Pain    Curtis Saunders is a 66 y.o. male returning to the ED for persistent low back pain.  Onset 06/29/2021 after taking a trench using at home, felt a sudden onset of pain of his left lower back.  Seen in the ED 07/01/2021, was provided Robaxin and Norco which provided no relief.  Denies saddle anesthesia, urinary or bowel incontinence, fevers, IV drug use, malignancy, hemoptysis, or lower extremity weakness.  Pain worsened with movements palpation.  Hx of HTN, AAA, LVH, CVA, CKD stage II.  No Hx of spine surgery, known malignancy, or tobacco use.  The history is provided by the patient and medical records.  Back Pain      Home Medications Prior to Admission medications   Medication Sig Start Date End Date Taking? Authorizing Provider  amLODipine (NORVASC) 10 MG tablet Take 1 tablet (10 mg total) by mouth daily. 08/02/20   Minus Breeding, MD  aspirin EC 81 MG EC tablet Take 1 tablet (81 mg total) by mouth daily. Swallow whole. 02/28/20   Barb Merino, MD  chlorthalidone (HYGROTON) 25 MG tablet Take 1 tablet (25 mg total) by mouth daily. 09/06/20 12/05/20  Minus Breeding, MD  HYDROcodone-acetaminophen (NORCO/VICODIN) 5-325 MG tablet Take 2 tablets by mouth every 6 (six) hours as needed. 07/01/21   Roemhildt, Lorin T, PA-C  methocarbamol (ROBAXIN) 500 MG tablet Take 1 tablet (500 mg total) by mouth 2 (two) times daily. 07/01/21   Roemhildt, Lorin T, PA-C  ramipril (ALTACE) 10 MG capsule Take 1 capsule (10 mg total) by mouth daily. 08/02/20   Minus Breeding, MD  rosuvastatin (CRESTOR) 20 MG tablet Take 1 tablet (20 mg total) by mouth daily. 12/15/20   Frann Rider, NP      Allergies    Bee venom    Review of Systems   Review of Systems   Musculoskeletal:  Positive for back pain.    Physical Exam Updated Vital Signs BP 129/87   Pulse 68   Temp (!) 97.5 F (36.4 C) (Oral)   Resp 16   Ht 6' (1.829 m)   Wt 103 kg   SpO2 100%   BMI 30.79 kg/m  Physical Exam  ED Results / Procedures / Treatments   Labs (all labs ordered are listed, but only abnormal results are displayed) Labs Reviewed - No data to display  EKG None  Radiology No results found.  Procedures Procedures  {Document cardiac monitor, telemetry assessment procedure when appropriate:1}  Medications Ordered in ED Medications - No data to display  ED Course/ Medical Decision Making/ A&P                           Medical Decision Making Amount and/or Complexity of Data Reviewed Radiology: ordered.   66 y.o. male presents to the ED for concern of Back Pain   This involves an extensive number of treatment options, and is a complaint that carries with it a high risk of complications and morbidity.  The emergent differential diagnosis prior to evaluation includes, but is not limited to: Sprain, fracture, muscle spasm, discitis, cauda equina, spinal abscess, dissection  This is not an exhaustive differential.   Past Medical History / Co-morbidities / Social History:  Hx of HTN, AAA, LVH, CVA, CKD stage II  Additional History:  Internal and external records from outside source obtained and reviewed including ED visits, neurology  Physical Exam: Physical exam performed. The pertinent findings include: ***  Lab Tests: None  Imaging Studies: I ordered imaging studies including XR lumbar spine .  I independently visualized and interpreted said imaging.  Pertinent results include:  I agree with the radiologist interpretation.  ED Course: Pt well-appearing on exam.  Presenting with persistent lower back pain.  Injured it 11 days ago while doing yard work, felt sudden pain.  Went to the ED 2 days later, tried Vicodin and a muscle relaxant, with  no relief.  No prior Hx of back surgery.  Without lower extremity weakness, unequal pulses, saddle anesthesia, or urinary/bowel incontinence.  Low suspicion for cauda equina or aortic dissection.  Without hemoptysis, fever, localized swelling or warmth, IV drug use, malignancy, low suspicion for epidural abscess or discitis.  With consistent pain, plan to proceed with imaging.  1245: XR still pending.  Patient in NAD in good condition at time of discharge.  Sprain, fracture, muscle spasm  Disposition: After consideration of the diagnostic results and the patient's encounter today, I feel that the emergency department workup does not suggest an emergent condition requiring admission or immediate intervention beyond what has been performed at this time.  The patient is safe for discharge and has been instructed to return immediately for worsening symptoms, change in symptoms or any other concerns.  I have reviewed the patients home medicines and have made adjustments as needed.  Discussed course of treatment thoroughly with the patient, whom demonstrated understanding.  Patient in agreement and has no further questions.    I discussed this case with my attending physician Dr. Eulis Foster, who agreed with the proposed treatment course and cosigned this note including patient's presenting symptoms, physical exam, and planned diagnostics and interventions.  Attending physician stated agreement with plan or made changes to plan which were implemented.     This chart was dictated using voice recognition software.  Despite best efforts to proofread, errors can occur which can change the documentation meaning.   {Document critical care time when appropriate:1} {Document review of labs and clinical decision tools ie heart score, Chads2Vasc2 etc:1}  {Document your independent review of radiology images, and any outside records:1} {Document your discussion with family members, caretakers, and with  consultants:1} {Document social determinants of health affecting pt's care:1} {Document your decision making why or why not admission, treatments were needed:1} Final Clinical Impression(s) / ED Diagnoses Final diagnoses:  None    Rx / DC Orders ED Discharge Orders     None

## 2021-07-14 DIAGNOSIS — M545 Low back pain, unspecified: Secondary | ICD-10-CM | POA: Insufficient documentation

## 2021-08-29 NOTE — Progress Notes (Signed)
Subjective:    Curtis Saunders - 66 y.o. male MRN [id number]  Date of birth: [birth year]  HPI  Curtis Saunders is to establish care. He is accompanied by his significant other, Curtis Saunders.   Current issues and/or concerns: - Established with Cardiology and Neurology.  - History of diabetes. Diagnosed at  66 years-old. Reports never took medication for diabetes. States "I outgrew diabetes." Endorses fatigue. Not monitoring what he eats but trying. Not exercising outside of normal routine. He does not smoke.    ROS per HPI     Health Maintenance:  Health Maintenance Due  Topic Date Due   COVID-19 Vaccine (1) Never done   Hepatitis C Screening  Never done   COLONOSCOPY (Pts 45-52yr Insurance coverage will need to be confirmed)  Never done   INFLUENZA VACCINE  08/23/2021     Past Medical History: Patient Active Problem List   Diagnosis Date Noted   Low back pain 07/14/2021   Pain in joint of left shoulder 08/12/2020   CKD (chronic kidney disease) stage 2, GFR 60-89 ml/min 04/12/2020   CVA (cerebral vascular [place]) [place] 02/27/2020   [place]ending aortic aneurysm ([facility] 02/11/2020   Double pterygium, 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 He has never been exposed to tobacco smoke. He has never used smokeless tobacco. He reports that he does not currently use alcohol. He reports that he does not use drugs.   Family History  family history includes COPD in his mother; Emphysema in his mother; Heart attack (age of onset: 766 in his mother; Hypertension in his sister.   Medications: reviewed and updated   Objective:   Physical Exam BP 127/83 (BP Location: Left Arm, Patient Position: Sitting, Cuff Size: Large)   Pulse 60   Temp 98.3 F (36.8 C)   Resp 16   Ht 6' 0.24" (1.835 m)   Wt 228  lb (103.4 kg)   SpO2 96%   BMI 30.71 kg/m   Physical Exam HENT:     Head: Normocephalic and atraumatic.  Eyes:     Extraocular Movements: Extraocular movements intact.     Conjunctiva/sclera: Conjunctivae normal.     Pupils: Pupils are equal, round, and reactive to light.  Cardiovascular:     Rate and Rhythm: Normal rate and regular rhythm.     Pulses: Normal pulses.     Heart sounds: Normal heart sounds.  Pulmonary:     Effort: Pulmonary effort is normal.     Breath sounds: Normal breath sounds.  Musculoskeletal:     Cervical back: Normal range of motion and neck supple.  Neurological:     General: No focal deficit present.     Mental Status: He is alert and oriented to person, place, and time.  Psychiatric:        Mood and Affect: Mood normal.        Behavior: Behavior normal.   Results for orders placed or performed in visit on 09/07/21  POCT glycosylated hemoglobin (Hb A1C)  Result Value Ref Range   Hemoglobin A1C 8.0 (A) 4.0 - 5.6 %   HbA1c POC (<> result, manual entry)     HbA1c, POC (prediabetic range)     HbA1c, POC (controlled diabetic range)         Assessment & Plan:  1. Encounter to  establish care - Patient presents today to establish care.  - Return for annual physical examination, labs, and health maintenance. Arrive fasting meaning having no food for at least 8 hours prior to appointment. You may have only water or black coffee. Please take scheduled medications as normal.  2. Type 2 diabetes mellitus with stage 3b chronic kidney disease, without long-term current use of insulin (HCC) - Hemoglobin A1c not at goal at 8.0%, goal < 7%. - Begin Empagliflozin as prescribed. Counseled on medication adherence and adverse effects. - Discussed the importance of healthy eating habits, low-carbohydrate diet, low-sugar diet, regular aerobic exercise (at least 150 minutes a week as tolerated) and medication compliance to achieve or maintain control of diabetes. -  Follow-up with primary provider in 4 weeks or sooner if needed.  - POCT glycosylated hemoglobin (Hb A1C) - empagliflozin (JARDIANCE) 25 MG TABS tablet; Take 1 tablet (25 mg total) by mouth daily before breakfast.  Dispense: 30 tablet; Refill: 0  3. Stage 3b chronic kidney disease ([place]) - Referral to Nephrology for further evaluation and management. - Ambulatory referral to Nephrology  4. LVH (left ventricular hypertrophy) 5. Aneurysm of ascending aorta without rupture ([place]) 6. Essential hypertension - Keep all scheduled appointments with Cardiology.   7. History of arterial ischemic stroke 8. Hyperlipidemia, unspecified hyperlipidemia type - Keep all scheduled appointments with Neurology.     Patient was given clear instructions to go to Emergency Department or return to medical center if symptoms don't improve, worsen, or new problems develop.The patient verbalized understanding.  I discussed the assessment and treatment plan with the patient. The patient was provided an opportunity to ask questions and all were answered. The patient agreed with the plan and demonstrated an understanding of the instructions.   The patient was advised to call back or seek an in-person evaluation if the symptoms worsen or if the condition fails to improve as anticipated.    Durene Fruits, NP 09/07/2021, 3:44 PM Primary Care at [facility]

## 2021-09-07 ENCOUNTER — Ambulatory Visit (INDEPENDENT_AMBULATORY_CARE_PROVIDER_SITE_OTHER): Payer: Medicaid Other | Admitting: Family

## 2021-09-07 ENCOUNTER — Telehealth: Payer: Self-pay | Admitting: Family

## 2021-09-07 ENCOUNTER — Encounter: Payer: Self-pay | Admitting: Family

## 2021-09-07 VITALS — BP 127/83 | HR 60 | Temp 98.3°F | Resp 16 | Ht 72.24 in | Wt 228.0 lb

## 2021-09-07 DIAGNOSIS — E785 Hyperlipidemia, unspecified: Secondary | ICD-10-CM

## 2021-09-07 DIAGNOSIS — I7121 Aneurysm of the ascending aorta, without rupture: Secondary | ICD-10-CM | POA: Diagnosis not present

## 2021-09-07 DIAGNOSIS — E1122 Type 2 diabetes mellitus with diabetic chronic kidney disease: Secondary | ICD-10-CM

## 2021-09-07 DIAGNOSIS — I517 Cardiomegaly: Secondary | ICD-10-CM

## 2021-09-07 DIAGNOSIS — I10 Essential (primary) hypertension: Secondary | ICD-10-CM

## 2021-09-07 DIAGNOSIS — N1832 Chronic kidney disease, stage 3b: Secondary | ICD-10-CM

## 2021-09-07 DIAGNOSIS — Z8673 Personal history of transient ischemic attack (TIA), and cerebral infarction without residual deficits: Secondary | ICD-10-CM

## 2021-09-07 DIAGNOSIS — Z7689 Persons encountering health services in other specified circumstances: Secondary | ICD-10-CM

## 2021-09-07 LAB — POCT GLYCOSYLATED HEMOGLOBIN (HGB A1C): Hemoglobin A1C: 8 % — AB (ref 4.0–5.6)

## 2021-09-07 MED ORDER — EMPAGLIFLOZIN 25 MG PO TABS: 25.0000 mg | ORAL_TABLET | Freq: Every day | ORAL | 0 refills | Status: DC

## 2021-09-07 NOTE — Patient Instructions (Addendum)
Thank you for choosing Primary Care at [facility] for your medical home!    Curtis Saunders was seen by Camillia Herter, NP today.   Curtis Saunders's primary care provider is Camillia Herter, NP.   For the best care possible,  you should try to see Curtis Fruits, NP whenever you come to office.   We look forward to seeing you again soon!  If you have any questions about your visit today,  please call us at [phone]  Or feel free to reach your provider via [place].    Empagliflozin Tablets What is this medication? EMPAGLIFLOZIN (EM pa gli FLOE zin) treats type 2 diabetes. It works by helping your kidneys remove sugar (glucose) from your blood through the urine, which decreases your blood sugar. It can also be used to lower the risk of heart attack, stroke, and hospitalization for heart failure in people with type 2 diabetes. Changes to diet and exercise are often combined with this medication. This medicine may be used for other purposes; ask your health care provider or pharmacist if you have questions. COMMON BRAND NAME(S): Jardiance What should I tell my care team before I take this medication? They need to know if you have any of these conditions: Dehydration Diabetic ketoacidosis Diet low in salt Eating less due to illness, surgery, dieting, or any other reason Frequently drink alcohol Having surgery High cholesterol High levels of potassium in the blood History of pancreatitis or pancreas problems History of yeast infection of the penis or vagina Infections in the bladder, kidneys, or urinary tract Kidney disease Liver disease Low blood pressure On hemodialysis Problems urinating Type 1 diabetes Uncircumcised male An unusual or allergic reaction to empagliflozin, other medications, foods, dyes, or preservatives Pregnant or trying to get pregnant Breast-feeding How should I use this medication? Take this medication by mouth with water. Take it as directed on  the prescription label at the same time every day. You may take it with or without food. Keep taking it unless your care team tells you to stop. A special MedGuide will be given to you by the pharmacist with each prescription and refill. Be sure to read this information carefully each time. Talk to your care team about the use of this medication in children. Special care may be needed. Overdosage: If you think you have taken too much of this medicine contact a poison control center or emergency room at once. NOTE: This medicine is only for you. Do not share this medicine with others. What if I miss a dose? If you miss a dose, take it as soon as you can. If it is almost time for your next dose, take only that dose. Do not take double or extra doses. What may interact with this medication? Alcohol Diuretics Insulin Lithium This list may not describe all possible interactions. Give your health care provider a list of all the medicines, herbs, non-prescription drugs, or dietary supplements you use. Also tell them if you smoke, drink alcohol, or use illegal drugs. Some items may interact with your medicine. What should I watch for while using this medication? Visit your care team for regular checks on your progress. Tell your care team if your symptoms do not start to get better or if they get worse. This medication can cause a serious condition in which there is too much acid in the blood. If you develop nausea, vomiting, stomach pain, unusual tiredness, or breathing problems, stop taking this medication and call  your care team right away. If possible, use a ketone dipstick to check for ketones in your urine. Check with your care team if you have severe diarrhea, nausea, and vomiting, or if you sweat a lot. The loss of too much body fluid may make it dangerous for you to take this medication. A test called the HbA1C (A1C) will be monitored. This is a simple blood test. It measures your blood sugar  control over the last 2 to 3 months. You will receive this test every 3 to 6 months. Learn how to check your blood sugar. Learn the symptoms of low and high blood sugar and how to manage them. Always carry a quick-source of sugar with you in case you have symptoms of low blood sugar. Examples include hard sugar candy or glucose tablets. Make sure others know that you can choke if you eat or drink when you develop serious symptoms of low blood sugar, such as seizures or unconsciousness. Get medical help at once. Tell your care team if you have high blood sugar. You might need to change the dose of your medication. If you are sick or exercising more than usual, you may need to change the dose of your medication. What side effects may I notice from receiving this medication? Side effects that you should report to your care team as soon as possible: Allergic reactions--skin rash, itching, hives, swelling of the face, lips, tongue, or throat Dehydration--increased thirst, dry mouth, feeling faint or lightheaded, headache, dark yellow or brown urine Diabetic ketoacidosis (DKA)--increased thirst or amount of urine, dry mouth, fatigue, fruity odor to breath, trouble breathing, stomach pain, nausea, vomiting Genital yeast infection--redness, swelling, pain, or itchiness, odor, thick or lumpy discharge New pain or tenderness, change in skin color, sores or ulcers, infection of the leg or foot Infection or redness, swelling, tenderness, or pain in the genitals, or area from the genitals to the back of the rectum Urinary tract infection (UTI)--burning when passing urine, passing frequent small amounts of urine, bloody or cloudy urine, pain in the lower back or sides This list may not describe all possible side effects. Call your doctor for medical advice about side effects. You may report side effects to FDA at 1-800-FDA-1088. Where should I keep my medication? Keep out of the reach of children and pets. Store  at room temperature between 20 and 25 degrees C (68 and 77 degrees F). Get rid of any unused medication after the expiration date. To get rid of medications that are no longer needed or have expired: Take the medication to a medication take-back program. Check with your pharmacy or law enforcement to find a location. If you cannot return the medication, check the label or package insert to see if the medication should be thrown out in the garbage or flushed down the toilet. If you are not sure, ask your care team. If it is safe to put it in the trash, take the medication out of the container. Mix the medication with cat litter, dirt, coffee grounds, or other unwanted substance. Seal the mixture in a bag or container. Put it in the trash. NOTE: This sheet is a summary. It may not cover all possible information. If you have questions about this medicine, talk to your doctor, pharmacist, or health care provider.  2023 Elsevier/Gold Standard (2020-04-15 00:00:00)

## 2021-09-07 NOTE — Progress Notes (Signed)
.  Pt presents to establish care,  -fatigue -request to have A1c checked =8.0

## 2021-09-07 NOTE — Telephone Encounter (Signed)
Copied from [place] [id number]. Topic: General - Other >> Sep 07, 2021  4:58 PM Everette C wrote: Reason for CRM: The patient has been told by their pharmacy that their empagliflozin (JARDIANCE) 25 MG TABS tablet [[id number]]  prescription will not be covered by their insurance it is roughly $600  The patient would like to be prescribed an alternative if possible   Please contact the patient further to discuss the medication

## 2021-09-08 NOTE — Telephone Encounter (Signed)
Will attempt PA through Cover my Meds to see if medication can be covered under pt insurance plan [id number]. pending approval..please allow 3 business days for response from authorization

## 2021-09-08 NOTE — Telephone Encounter (Signed)
Pt called in to follow up on previous message in regards to medication cost. Advised pt that a message has been sent to provider for further assistance.    Please advise further.     CB: [phone]

## 2021-09-12 ENCOUNTER — Other Ambulatory Visit: Payer: Self-pay | Admitting: Cardiology

## 2021-09-12 NOTE — Telephone Encounter (Signed)
Spoke to pt advise that prior authorization for Curtis Saunders has been approved and info faxed to pharmacy

## 2021-09-27 ENCOUNTER — Ambulatory Visit: Payer: Self-pay

## 2021-09-27 ENCOUNTER — Ambulatory Visit (INDEPENDENT_AMBULATORY_CARE_PROVIDER_SITE_OTHER): Payer: Medicaid Other | Admitting: Family

## 2021-09-27 ENCOUNTER — Encounter: Payer: Self-pay | Admitting: Family

## 2021-09-27 VITALS — BP 93/66 | HR 99 | Temp 98.3°F | Resp 16 | Ht 71.0 in | Wt 221.0 lb

## 2021-09-27 DIAGNOSIS — E1122 Type 2 diabetes mellitus with diabetic chronic kidney disease: Secondary | ICD-10-CM

## 2021-09-27 DIAGNOSIS — R350 Frequency of micturition: Secondary | ICD-10-CM

## 2021-09-27 DIAGNOSIS — Z1211 Encounter for screening for malignant neoplasm of colon: Secondary | ICD-10-CM

## 2021-09-27 DIAGNOSIS — R432 Parageusia: Secondary | ICD-10-CM

## 2021-09-27 DIAGNOSIS — K5900 Constipation, unspecified: Secondary | ICD-10-CM

## 2021-09-27 DIAGNOSIS — R319 Hematuria, unspecified: Secondary | ICD-10-CM | POA: Diagnosis not present

## 2021-09-27 DIAGNOSIS — N1832 Chronic kidney disease, stage 3b: Secondary | ICD-10-CM

## 2021-09-27 DIAGNOSIS — R399 Unspecified symptoms and signs involving the genitourinary system: Secondary | ICD-10-CM | POA: Diagnosis not present

## 2021-09-27 DIAGNOSIS — R109 Unspecified abdominal pain: Secondary | ICD-10-CM

## 2021-09-27 MED ORDER — DOCUSATE SODIUM 100 MG PO CAPS: 100.0000 mg | ORAL_CAPSULE | Freq: Two times a day (BID) | ORAL | 2 refills | Status: AC

## 2021-09-27 NOTE — Progress Notes (Signed)
Urinary frequency and unable to have full BM.  Flank pain

## 2021-09-27 NOTE — Telephone Encounter (Signed)
  Chief Complaint: Blood in urine, kidney pain. Possible fever. Symptoms: ibid Frequency: Since starting new medication 09/07/2021 Pertinent Negatives: Patient denies  Disposition: '[]'$ ED /'[]'$ Urgent Care (no appt availability in office) / '[x]'$ Appointment(In office/virtual)/ '[]'$  [facility]/ '[]'$ Home Care/ '[]'$ Refused Recommended Disposition /'[]'$ [facility]/ '[]'$  Follow-up with PCP Additional Notes: PT states that these s/s started when he started new medication 09/07/2021. Pt states he has trouble holding urine, and has increased frequency. PT states that urine is dark and his kidneys hurt. Pt also thinks he may have a fever.   Reason for Disposition  Urinating more frequently than usual (i.e., frequency)  Answer Assessment - Initial Assessment Questions 1. SYMPTOM: "What's the main symptom you're concerned about?" (e.g., frequency, incontinence)     Blood in urine, Can't hold urine, Stool looks like rabbit pellets 2. ONSET: "When did the  s/s  start?"     When he started new medication. 3. PAIN: "Is there any pain?" If Yes, ask: "How bad is it?" (Scale: 1-10; mild, moderate, severe)     Kidneys hurt, 8/10 4. CAUSE: "What do you think is causing the symptoms?"     New medication 5. OTHER SYMPTOMS: "Do you have any other symptoms?" (e.g., blood in urine, fever, flank pain, pain with urination)     Blood in urine , Urinary frequency 6. PREGNANCY: "Is there any chance you are pregnant?" "When was your last menstrual period?"     na  Protocols used: Urinary Symptoms-A-AH

## 2021-09-27 NOTE — Progress Notes (Addendum)
Patient ID: Curtis Saunders, male    DOB: 04/01/1955  MRN: [id number]  CC: Urinary Issues  Subjective: Curtis Saunders is a 66 y.o. male who presents for urinary issues.   His concerns today include:  09/27/2021 per triage RN note: Chief Complaint: Blood in urine, kidney pain. Possible fever. Symptoms: ibid Frequency: Since starting new medication 09/07/2021 Pertinent Negatives: Patient denies  Disposition: '[]'$ ED /'[]'$ Urgent Care (no appt availability in office) / '[x]'$ Appointment(In office/virtual)/ '[]'$  [facility]/ '[]'$ Home Care/ '[]'$ Refused Recommended Disposition /'[]'$ [facility]/ '[]'$  Follow-up with PCP Additional Notes: PT states that these s/s started when he started new medication 09/07/2021. Pt states he has trouble holding urine, and has increased frequency. PT states that urine is dark and his kidneys hurt. Pt also thinks he may have a fever.   Reason for Disposition  Urinating more frequently than usual (i.e., frequency)  Answer Assessment - Initial Assessment Questions 1. SYMPTOM: "What's the main symptom you're concerned about?" (e.g., frequency, incontinence)     Blood in urine, Can't hold urine, Stool looks like rabbit pellets 2. ONSET: "When did the  s/s  start?"     When he started new medication. 3. PAIN: "Is there any pain?" If Yes, ask: "How bad is it?" (Scale: 1-10; mild, moderate, severe)     Kidneys hurt, 8/10 4. CAUSE: "What do you think is causing the symptoms?"     New medication 5. OTHER SYMPTOMS: "Do you have any other symptoms?" (e.g., blood in urine, fever, flank pain, pain with urination)     Blood in urine , Urinary frequency 6. PREGNANCY: "Is there any chance you are pregnant?" "When was your last menstrual period?"     na  Protocols used: Urinary Symptoms-A-AH  Today's visit 09/27/2021: - Reports blood in urine and urinary frequency next day after beginning Jardiance prescribed 09/07/2021 . Reports last dose of Jardiance was 2 days ago,  09/25/2021, and no blood in urine since then.  - Reports constipation and stool appearance of rabbit pellets. Reports has tried remedies and nothing helps.  - Loss of taste began 2 days ago without additional symptoms.   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 Swallow whole. 30 tablet 11   chlorthalidone (HYGROTON) 25 MG tablet Take 1 tablet by mouth once daily 90 tablet 3   ramipril (ALTACE) 10 MG capsule Take 1 capsule by mouth once daily 90 capsule 3   rosuvastatin (CRESTOR) 20 MG tablet Take 1 tablet (20 mg total) by mouth daily. 90 tablet 3   No current facility-administered medications on file prior to visit.    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     Social Determinants of Health   Financial Resource Strain: Not on file  Food Insecurity: Not on file  Transportation Needs: Not on file  Physical Activity: Not on file  Stress: Not on file  Social Connections: Not on file  Intimate Partner Violence: Not on file    Family History  Problem Relation Age of Onset   Heart attack Mother 85   COPD Mother    Emphysema Mother    Hypertension Sister    Sleep apnea Neg Hx     Past Surgical History:  Procedure Laterality Date   APPENDECTOMY     EYE SURGERY     HAND SURGERY     KNEE SURGERY     x3, x1 R   LIPOMA EXCISION Left 10/13/2019   Procedure: SHOULDER EXCISION LIPOMA AND ROTATOR CUFF REPAIR;  Surgeon: Tania Ade, MD;  Location: [facility];  Service: Orthopedics;  Laterality: Left;   TONSILLECTOMY      ROS: Review of Systems Negative except as stated above  PHYSICAL EXAM: BP 93/66 (BP Location: Right Arm, Patient Position: Sitting, Cuff Size: Large)   Pulse 99   Temp 98.3 F (36.8 C)   Resp 16   Ht '5\' 11"'$  (1.803 m)   Wt 221 lb (100.2 kg)   SpO2 92%   BMI 30.82 kg/m   Physical Exam HENT:     Head: Normocephalic and atraumatic.  Eyes:     Extraocular Movements: Extraocular movements intact.     Conjunctiva/sclera: Conjunctivae normal.     Pupils: Pupils are equal, round, and reactive to light.  Cardiovascular:     Rate and Rhythm: Normal rate and regular rhythm.     Pulses: Normal pulses.     Heart sounds: Normal heart sounds.  Pulmonary:     Effort: Pulmonary effort is normal.     Breath sounds: Normal breath sounds.  Musculoskeletal:     Cervical back: Normal range of motion and neck supple.  Neurological:     General: No focal deficit present.     Mental Status: He is alert and oriented to person, place, and time.  Psychiatric:        Mood and Affect: Mood normal.        Behavior: Behavior normal.    ASSESSMENT AND PLAN: 1. Lower urinary  tract symptoms (LUTS) 2. Hematuria, unspecified type 3. Urinary frequency - Today patient reports hematuria resolved 09/25/2021 since self-discontinuing Empagliflozin. - Urinalysis and urine culture screening pending.  - Update BMP.  - Referral to Urology for further evaluation and management.  - Ambulatory referral to Urology - Urinalysis, Routine w reflex microscopic - Urine Culture - Basic Metabolic Panel  4. Type 2 diabetes mellitus with stage 3b chronic kidney disease, without long-term current use of insulin ([place]) - Patient reported during primary care appointment on 09/07/2021 he was  diagnosed with diabetes at 66 years-old and never took medication for management. Patient stated "I outgrew diabetes." - Empagliflozin discontinued related to patient reported side effect of hematuria (see #1). Discussed with patient hematuria is not an expected side effect of Empagliflozin.  - Referral to Endocrinology for further evaluation and management.  - Ambulatory referral to Endocrinology  5. Constipation, unspecified constipation type 6. Flank pain - Docusate sodium as prescribed. Counseled on medication adherence and adverse effects.  - Referral to Gastroenterology for further evaluation and management.  - Ambulatory referral to Gastroenterology - docusate sodium (COLACE) 100 MG capsule; Take 1 capsule (100 mg total) by mouth 2 (two) times daily.  Dispense: 60 capsule; Refill: 2  7. Colon cancer screening - Referral to Gastroenterology for further evaluation and management.  - Ambulatory referral to Gastroenterology  8. Loss of taste - Screening respiratory panel.  - COVID-19, Flu A+B and RSV   Patient was given the opportunity to ask questions.  Patient verbalized understanding of the plan and was able to repeat key elements of the plan. Patient was given clear instructions to go to Emergency Department or return to medical center if symptoms don't improve, worsen, or new problems  develop.The patient verbalized understanding.   Orders Placed This Encounter  Procedures   Urine Culture   COVID-19, Flu A+B and RSV   Urinalysis, Routine w reflex microscopic   Basic Metabolic Panel   Ambulatory referral to Urology   Ambulatory referral to Endocrinology   Ambulatory referral to Gastroenterology     Requested Prescriptions   Signed Prescriptions Disp Refills   docusate sodium (COLACE) 100 MG capsule 60 capsule 2    Sig: Take 1 capsule (100 mg total) by mouth 2 (two) times daily.    Follow-up with primary provider as scheduled.   Camillia Herter, NP

## 2021-09-28 LAB — BASIC METABOLIC PANEL
BUN/Creatinine Ratio: 12 (ref 10–24)
BUN: 33 mg/dL — ABNORMAL HIGH (ref 8–27)
CO2: 20 mmol/L (ref 20–29)
Calcium: 9.5 mg/dL (ref 8.6–10.2)
Chloride: 95 mmol/L — ABNORMAL LOW (ref 96–106)
Creatinine, Ser: 2.75 mg/dL — ABNORMAL HIGH (ref 0.76–1.27)
Glucose: 122 mg/dL — ABNORMAL HIGH (ref 70–99)
Potassium: 3.6 mmol/L (ref 3.5–5.2)
Sodium: 138 mmol/L (ref 134–144)
eGFR: 25 mL/min/{1.73_m2} — ABNORMAL LOW (ref >59)

## 2021-09-28 NOTE — Telephone Encounter (Signed)
Patient had OV on yesterday with PCP.

## 2021-09-29 ENCOUNTER — Telehealth: Payer: Self-pay | Admitting: Family

## 2021-09-29 LAB — COVID-19, FLU A+B AND RSV
Influenza A, NAA: NOT DETECTED
Influenza B, NAA: NOT DETECTED
RSV, NAA: NOT DETECTED
SARS-CoV-2, NAA: NOT DETECTED

## 2021-09-29 NOTE — Telephone Encounter (Signed)
Copied from [place] [id number]. Topic: Referral - Question >> Sep 29, 2021  2:56 PM Erskine Squibb wrote: Reason for CRM: Kieth Brightly with [facility] called stating they can't see the patient because they don't take medicaid. Please assist patient further.

## 2021-10-04 NOTE — Telephone Encounter (Signed)
Thank you :)

## 2021-10-05 NOTE — Progress Notes (Signed)
Patient ID: Curtis Saunders, male    DOB: [birth year]  MRN: [id number]  CC: Knee Pain   Subjective: Curtis Saunders is a 66 y.o. male who presents for knee pain.  His concerns today include:  Right knee pain x 2 weeks. Began after falling in a hole while walking down the sidewalk. Using heating pad for pain relief and helps. States he does not have any cartilage or ligaments in his right knee because was removed several years ago by an orthopedist after he fell in a hole around that time as well. Able to walk as normal and denies red flag symptoms. No further issues or concerns.   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 Swallow whole. 30 tablet 11   chlorthalidone (HYGROTON) 25 MG tablet Take 1 tablet by mouth once daily 90 tablet 3   docusate sodium (COLACE) 100 MG capsule Take 1 capsule (100 mg total) by mouth 2 (two) times daily. 60 capsule 2   ramipril (ALTACE) 10 MG capsule Take 1 capsule by mouth once daily 90 capsule 3   rosuvastatin (CRESTOR) 20 MG tablet Take 1 tablet (20 mg total) by mouth daily. 90 tablet 3   No current facility-administered medications on file prior to visit.    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     Social Determinants of Health   Financial Resource Strain: Not on file  Food Insecurity: Not on file  Transportation Needs: Not on file  Physical Activity: Not on file  Stress: Not on file  Social Connections: Not on file  Intimate Partner Violence: Not on file    Family History  Problem Relation Age of Onset   Heart attack Mother 46   COPD Mother    Emphysema Mother    Hypertension Sister    Sleep apnea Neg Hx     Past Surgical History:  Procedure Laterality Date   APPENDECTOMY     EYE SURGERY     HAND SURGERY     KNEE SURGERY  x3, x1 R   LIPOMA EXCISION Left 10/13/2019   Procedure: SHOULDER EXCISION LIPOMA AND ROTATOR CUFF REPAIR;  Surgeon: Tania Ade, MD;  Location: [facility];  Service: Orthopedics;  Laterality: Left;   TONSILLECTOMY      ROS: Review of Systems Negative except as stated above  PHYSICAL EXAM: BP 120/84 (BP Location: Left Arm, Patient Position: Sitting, Cuff Size: Large)   Pulse 94   Temp 97.8 F (36.6 C)   Resp 16   Wt 223 lb (101.2 kg)   SpO2 97%   BMI 31.10 kg/m   Physical Exam HENT:     Head: Normocephalic and atraumatic.  Eyes:     Extraocular Movements: Extraocular movements intact.     Conjunctiva/sclera: Conjunctivae normal.     Pupils: Pupils are equal, round, and reactive to light.  Cardiovascular:     Rate and Rhythm: Normal rate and regular rhythm.     Pulses: Normal pulses.     Heart  sounds: Normal heart sounds.  Pulmonary:     Effort: Pulmonary effort is normal.     Breath sounds: Normal breath sounds.  Musculoskeletal:     Cervical back: Normal range of motion and neck supple.     Right knee: Tenderness present.     Left knee: Normal.     Right lower leg: Normal.     Left lower leg: Normal.     Right ankle: Normal.     Left ankle: Normal.     Right foot: Normal.     Left foot: Normal.  Neurological:     General: No focal deficit present.     Mental Status: He is alert and oriented to person, place, and time.  Psychiatric:        Mood and Affect: Mood normal.        Behavior: Behavior normal.     ASSESSMENT AND PLAN: 1. Acute pain of right knee - Ketorolac and triamcinolone acetonide injections administered in office.  - Referral to Orthopedic Surgery for further evaluation and management.  - ketorolac (TORADOL) injection 60 mg - triamcinolone acetonide (KENALOG-40) injection 40 mg - Ambulatory referral to Orthopedic Surgery    Patient was given the opportunity to ask questions.  Patient verbalized understanding of the plan and was able to repeat key elements of the plan. Patient was given clear instructions to go to Emergency Department or return to medical center if symptoms don't improve, worsen, or new problems develop.The patient verbalized understanding.   Orders Placed This Encounter  Procedures   Ambulatory referral to Orthopedic Surgery    Follow-up with primary provider as scheduled.   Camillia Herter, NP

## 2021-10-19 ENCOUNTER — Ambulatory Visit (INDEPENDENT_AMBULATORY_CARE_PROVIDER_SITE_OTHER): Payer: Medicaid Other | Admitting: Family

## 2021-10-19 VITALS — BP 120/84 | HR 94 | Temp 97.8°F | Resp 16 | Wt 223.0 lb

## 2021-10-19 DIAGNOSIS — M25561 Pain in right knee: Secondary | ICD-10-CM | POA: Diagnosis not present

## 2021-10-19 MED ORDER — KETOROLAC TROMETHAMINE 60 MG/2ML IM SOLN
60.0000 mg | Freq: Once | INTRAMUSCULAR | Status: AC
  Administered 2021-10-19: 60 mg via INTRAMUSCULAR

## 2021-10-19 MED ORDER — TRIAMCINOLONE ACETONIDE 40 MG/ML IJ SUSP
40.0000 mg | Freq: Once | INTRAMUSCULAR | Status: AC
  Administered 2021-10-19: 40 mg via INTRAMUSCULAR

## 2021-10-19 NOTE — Patient Instructions (Signed)
Acute Knee Pain, Adult Acute knee pain is sudden and may be caused by damage, swelling, or irritation of the muscles and tissues that support the knee. Pain may result from: A fall. An injury to the knee from twisting motions. A hit to the knee. Infection. Acute knee pain may go away on its own with time and rest. If it does not, your health care provider may order tests to find the cause of the pain. These may include: Imaging tests, such as an X-ray, MRI, CT scan, or ultrasound. Joint aspiration. In this test, fluid is removed from the knee and evaluated. Arthroscopy. In this test, a lighted tube is inserted into the knee and an image is projected onto a TV screen. Biopsy. In this test, a sample of tissue is removed from the body and studied under a microscope. Follow these instructions at home: If you have a knee sleeve or brace:  Wear the knee sleeve or brace as told by your health care provider. Remove it only as told by your health care provider. Loosen it if your toes tingle, become numb, or turn cold and blue. Keep it clean. If the knee sleeve or brace is not waterproof: Do not let it get wet. Cover it with a watertight covering when you take a bath or shower. Activity Rest your knee. Do not do things that cause pain or make pain worse. Avoid high-impact activities or exercises, such as running, jumping rope, or doing jumping jacks. Work with a physical therapist to make a safe exercise program, as recommended by your health care provider. Do exercises as told by your physical therapist. Managing pain, stiffness, and swelling  If directed, put ice on the affected knee. To do this: If you have a removable knee sleeve or brace, remove it as told by your health care provider. Put ice in a plastic bag. Place a towel between your skin and the bag. Leave the ice on for 20 minutes, 2-3 times a day. Remove the ice if your skin turns bright red. This is very important. If you cannot  feel pain, heat, or cold, you have a greater risk of damage to the area. If directed, use an elastic bandage to put pressure (compression) on your injured knee. This may control swelling, give support, and help with discomfort. Raise (elevate) your knee above the level of your heart while you are sitting or lying down. Sleep with a pillow under your knee. General instructions Take over-the-counter and prescription medicines only as told by your health care provider. Do not use any products that contain nicotine or tobacco, such as cigarettes, e-cigarettes, and chewing tobacco. If you need help quitting, ask your health care provider. If you are overweight, work with your health care provider and a dietitian to set a weight-loss goal that is healthy and reasonable for you. Extra weight can put pressure on your knee. Pay attention to any changes in your symptoms. Keep all follow-up visits. This is important. Contact a health care provider if: Your knee pain continues, changes, or gets worse. You have a fever along with knee pain. Your knee feels warm to the touch or is red. Your knee buckles or locks up. Get help right away if: Your knee swells, and the swelling becomes worse. You cannot move your knee. You have severe pain in your knee that cannot be managed with pain medicine. Summary Acute knee pain can be caused by a fall, an injury, an infection, or damage, swelling, or irritation   of the tissues that support your knee. Your health care provider may perform tests to find out the cause of the pain. Pay attention to any changes in your symptoms. Relieve your pain with rest, medicines, light activity, and the use of ice. Get help right away if your knee swells, you cannot move your knee, or you have severe pain that cannot be managed with medicine. This information is not intended to replace advice given to you by your health care provider. Make sure you discuss any questions you have with  your health care provider. Document Revised: 06/25/2019 Document Reviewed: 06/25/2019 Elsevier Patient Education  2023 [employer]  

## 2021-10-19 NOTE — Progress Notes (Signed)
Pt presents for right knee pain  -started about 2 weeks ago -heating pad for pain relief

## 2021-10-25 NOTE — Progress Notes (Deleted)
[facility] [address] [place]. [place] [address] [phone]       STROKE FOLLOW UP NOTE  [name] Date of Birth:  [birth year] Medical Record Number:  [id number]   Reason for Referral: stroke follow up    SUBJECTIVE:   CHIEF COMPLAINT:  No chief complaint on file.     HPI:   Update 10/26/2021 JM: Patient returns per request for ***. Previously seen 4 months ago and advised to f/u as needed as stable from stroke standpoint.         History provided for reference purposes only Update 06/23/2021 JM: Patient returns for 9-monthstroke follow-up.  Has been stable without new or reoccurring stroke/TIA symptoms.  Compliant on aspirin and Crestor, denies side effects.  Increase Crestor dosage from 10 mg to 20 mg after prior visit for LDL at 113.  Blood pressure today 129/87.  Evaluated by Dr. ARexene Alberts2/2023 for possible underlying sleep apnea contributing to continued fatigue, he declined further testing/evaluation at that time.  He denies any continued issues with fatigue or sleeping related concerns.  Routinely follows with cardiology.  He has not yet established care with PCP.  No new concerns at this time.  Update 12/14/2020 JM: Returns for 465-monthtroke follow-up.  Overall stable -denies new or reoccurring stroke/TIA symptoms Continues to experience excessive daytime fatigue which has been present since his stroke. He reports sleeping well through the night - will occasionally wake up in the middle of the night but shortly go back to sleep. He will typically get 8-12 hrs of sleep per night. Does endorse limited to no physical activity or exercise.  Denies depression/anxiety.  Compliant on aspirin and Crestor 10 10 mg daily-denies side effects Blood pressure today 124/74 -routinely followed by cardiology  No new concerns at this time   Update 08/03/2020 JM: [name] for 4-24-monthroke follow-up.  Doing well since prior visit without  new or reoccurring stroke/TIA symptoms.  He does report fatigue which has been present since his stroke which persist throughout the day and can worsen with activity.  He does admit to being sedentary with little to no physical activity during the day.  Denies any difficulty sleeping, snoring or witnessed apnea.  Denies any depression type symptoms or concerns.  Compliant on aspirin without associated side effects.  Lipid panel at prior visit showed LDL 100 therefore advised to restart atorvastatin 40 mg daily -he reports taking for only short duration then self discontinued due to constipation.  Blood pressure today 144/90.  No further concerns at this time.  Initial visit 04/01/2020 JM: [name] being seen for hospital follow-up unaccompanied  Reports complete recovery since discharge He has returned back to all prior activities without difficulty Denies new stroke/TIA symptoms  Completed 3 weeks DAPT and remains on aspirin alone -denies side effects Not currently on atorvastatin -thought he only needed to take for 30 days but denies side effects while taking consistently Blood pressure today 156/95 -recently switched from rampiril to valsartan but apparently blood pressure became elevated with SBP 190-200s therefore he self discontinued valsartan and restarted taking Rampiril -blood pressures have been more consistently 150s/80s  No concerns at this time  Stroke admission 02/27/2020 [name] a 66 58o. male with history of HTN  presented on 02/27/2020 with slurred speech and right facial droop.  Personally reviewed hospitalization pertinent progress notes, lab work and imaging with summary provided.  Evaluated by Dr. 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  Recommended cardiology consult for further evaluation.  Recommended DAPT for 3 weeks and  aspirin alone.  LDL 108 -initiate atorvastatin 40 mg daily.  A1c 6.2.  Other stroke risk factors include prior stroke on imaging, family history of stroke and Covid 19 infection 02/12/2020.  Residual deficits mild dysarthria and right facial droop.  Evaluated by therapies and recommended outpatient SLP and discharged home in stable condition.  Stroke - Infarct in the PLIC likely due to small vessel disease  MRI - 9 mm acute infarct centered within the posterior limb of left internal capsule. This infarct may also involve the adjacent left  thalamus and lentiform nucleus.Two chronic small-vessel infarcts within the left corona radiata/basal ganglia, new as compared to the head CT of 02/20/2019. Redemonstrated chronic lacunar infarct within the right corona radiata.Background mild cerebral white matter chronic small vessel ischemic disease. CTA head & neck No large vessel occlusion, significant stenosis, or aneurysm in the head and neck. Small left upper lobe pulmonary nodules measuring up to 4 mm. No follow-up needed if patient is low-risk, Diffusely enlarged thyroid with a possible 1.5 cm nodule in the left lobe. 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  EF 65 to 70% LDL 108 HgbA1c 6.2 VTE prophylaxis -none None prior to admission, now on aspirin 325 mg daily. Can change to aspirin 81 mg and Plavix 75  for 3 weeks and aspirin alone Therapy recommendations:  OP SLP Disposition:  Home      ROS:   14 system review of systems performed and negative with exception of those listed in HPI  PMH:  Past Medical History:  Diagnosis Date   Hypertension    Stroke ([place])     PSH:  Past Surgical History:  Procedure Laterality Date   APPENDECTOMY     EYE SURGERY     HAND SURGERY     KNEE SURGERY     x3, x1 R   LIPOMA EXCISION Left 10/13/2019   Procedure: SHOULDER EXCISION LIPOMA AND ROTATOR CUFF REPAIR;  Surgeon: Tania Ade, MD;  Location: [place];  Service: Orthopedics;  Laterality: Left;   TONSILLECTOMY      Social History:  Social History   Socioeconomic History   Marital status: Single    Spouse name: Not on file   Number of children: Not on file   Years of education: Not on file   Highest education level: Not on file  Occupational History   Not on file  Tobacco Use   Smoking status: Never    Passive exposure: Never   Smokeless tobacco: Never  Vaping Use   Vaping Use: Never used  Substance and Sexual Activity   Alcohol use: Not Currently   Drug use: No   Sexual activity: Yes  Other Topics Concern   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     Social Determinants of Health   Financial Resource Strain: Not on file  Food Insecurity: Not on file  Transportation Needs: Not on file  Physical Activity: Not on file  Stress: Not on file  Social Connections: Not on file  Intimate Partner Violence: Not on file    Family History:  Family History  Problem Relation Age of Onset   Heart attack Mother 47   COPD Mother  Emphysema Mother    Hypertension Sister    Sleep apnea Neg Hx     Medications:   Current Outpatient Medications on File Prior to Visit  Medication Sig Dispense Refill   amLODipine (NORVASC) 10 MG tablet Take 1 tablet by mouth once daily 90 tablet 3   aspirin EC 81 MG EC tablet Take 1 tablet (81 mg total) by mouth daily. Swallow whole. 30 tablet 11   chlorthalidone (HYGROTON) 25 MG tablet Take 1 tablet by mouth once daily 90 tablet 3   docusate sodium (COLACE) 100 MG capsule Take 1 capsule (100 mg total) by mouth 2 (two) times daily. 60 capsule 2   ramipril (ALTACE) 10 MG capsule Take 1 capsule by mouth once daily 90 capsule 3   rosuvastatin (CRESTOR) 20 MG tablet Take 1 tablet (20 mg total) by mouth daily. 90 tablet 3   No current facility-administered medications on file prior to visit.    Allergies:   Allergies  Allergen Reactions   Bee Venom  Anaphylaxis      OBJECTIVE:  Physical Exam  There were no vitals filed for this visit.  There is no height or weight on file to calculate BMI. No results found.   General: well developed, well nourished,  pleasant middle-aged African-American male, seated, in no evident distress Head: head normocephalic and atraumatic.   Neck: supple with no carotid or supraclavicular bruits Cardiovascular: regular rate and rhythm, no murmurs Musculoskeletal: no deformity Skin:  no rash/petichiae Vascular:  Normal pulses all extremities   Neurologic Exam Mental Status: Awake and fully alert.  Fluent speech and language.  Oriented to place and time. Recent and remote memory intact. Attention span, concentration and fund of knowledge appropriate. Mood and affect appropriate.  Cranial Nerves: Pupils equal, briskly reactive to light. Extraocular movements full without nystagmus. Visual fields full to confrontation. Hearing intact. Facial sensation intact. Face, tongue, palate moves normally and symmetrically.  Motor: Normal bulk and tone. Normal strength in all tested extremity muscles Sensory.: intact to touch , pinprick , position and vibratory sensation.  Coordination: Rapid alternating movements normal in all extremities. Finger-to-nose and heel-to-shin performed accurately bilaterally. Gait and Station: Arises from chair without difficulty. Stance is normal. Gait demonstrates normal stride length and balance without use of assistive device. Tandem walk and heel toe with mild difficulty.  Reflexes: 1+ and symmetric. Toes downgoing.        ASSESSMENT: HAROON SHATTO is a 65 y.o. 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 Vascular risk factors include prior stroke on imaging, HTN, HLD, prediabetes and cardiomyopathy.     PLAN:  L PLIC stroke :  Recovered well  without residual focal deficits  Continue aspirin 81 mg daily and Crestor '20mg'$  daily for secondary stroke prevention.  Discussed secondary stroke prevention measures and importance of establishing care with PCP (again discussed) follow up for aggressive stroke risk factor management including BP goal<130/90, and HLD with LDL goal<70 and pre-DM with A1c goal<7.0 Stroke labs: LDL 113 (11/2020), A1c 6.2 (02/2020)  Repeat lipid panel and A1c today  Thyroid nodule: CTA 02/2020 diffusely enlarged thyroid with a possible 1.5 cm nodule in the left lobe.  Previously placed ultrasound ordered not yet completed, will replace order to ensure this is completed.     Overall stable from stroke standpoint without further recommendations.  Advised importance of establishing care with PCP for ongoing  stroke risk factor management.  He was advised if he is unable to obtain PCP over the next year, we can see him back if needed     I spent 26 minutes of face-to-face and non-face-to-face time with patient.  This included previsit chart review, lab review, study review, order entry, electronic health record documentation, patient education regarding prior stroke and etiology,  secondary stroke prevention measures and importance of managing stroke risk factors, and answered all other questions to patient satisfaction  Frann Rider, [facility]  [facility] [address] [place], [place] [address]  Phone [phone] Fax [phone] Note: This document was prepared with digital dictation and possible smart phrase technology. Any transcriptional errors that result from this process are unintentional.

## 2021-10-26 ENCOUNTER — Ambulatory Visit: Payer: Medicaid Other | Admitting: Adult Health

## 2021-11-08 DIAGNOSIS — M25561 Pain in right knee: Secondary | ICD-10-CM | POA: Insufficient documentation

## 2021-11-08 DIAGNOSIS — M1711 Unilateral primary osteoarthritis, right knee: Secondary | ICD-10-CM | POA: Insufficient documentation

## 2021-11-09 ENCOUNTER — Telehealth: Payer: Self-pay

## 2021-11-09 NOTE — Telephone Encounter (Signed)
Att to contact pt to verify if influenza vaccine had been completed no ans lvm

## 2021-11-10 ENCOUNTER — Encounter: Payer: Self-pay | Admitting: Gastroenterology

## 2021-11-29 ENCOUNTER — Other Ambulatory Visit: Payer: Self-pay | Admitting: Nephrology

## 2021-11-29 DIAGNOSIS — I129 Hypertensive chronic kidney disease with stage 1 through stage 4 chronic kidney disease, or unspecified chronic kidney disease: Secondary | ICD-10-CM

## 2021-11-29 DIAGNOSIS — N1832 Chronic kidney disease, stage 3b: Secondary | ICD-10-CM

## 2021-11-30 ENCOUNTER — Ambulatory Visit
Admission: RE | Admit: 2021-11-30 | Discharge: 2021-11-30 | Disposition: A | Discharge status: Home / Self Care | Payer: Medicaid Other | Source: Ambulatory Visit | Attending: Nephrology | Admitting: Nephrology

## 2021-11-30 DIAGNOSIS — I129 Hypertensive chronic kidney disease with stage 1 through stage 4 chronic kidney disease, or unspecified chronic kidney disease: Secondary | ICD-10-CM

## 2021-11-30 DIAGNOSIS — N1832 Chronic kidney disease, stage 3b: Secondary | ICD-10-CM

## 2021-12-13 ENCOUNTER — Ambulatory Visit
Admission: RE | Admit: 2021-12-13 | Discharge: 2021-12-13 | Discharge status: Absent without leave | Payer: Medicaid Other | Source: Ambulatory Visit | Attending: Family | Admitting: Family

## 2021-12-13 IMAGING — US US SCROTUM W/ DOPPLER COMPLETE
1 series · 14 of 25 positions shown · non-contrast
Comparison: 10/30/2017

CLINICAL DATA: Right testicular pain/swelling

EXAM:
SCROTAL ULTRASOUND
DOPPLER ULTRASOUND OF THE TESTICLES
TECHNIQUE: Complete ultrasound examination of the testicles, epididymis, and
other scrotal structures was performed. Color and spectral Doppler
ultrasound were also utilized to evaluate blood flow to the
testicles.

[Series 1: us scrotum w/ doppler complete · 14 of 55 slices shown]
[im 1/55]
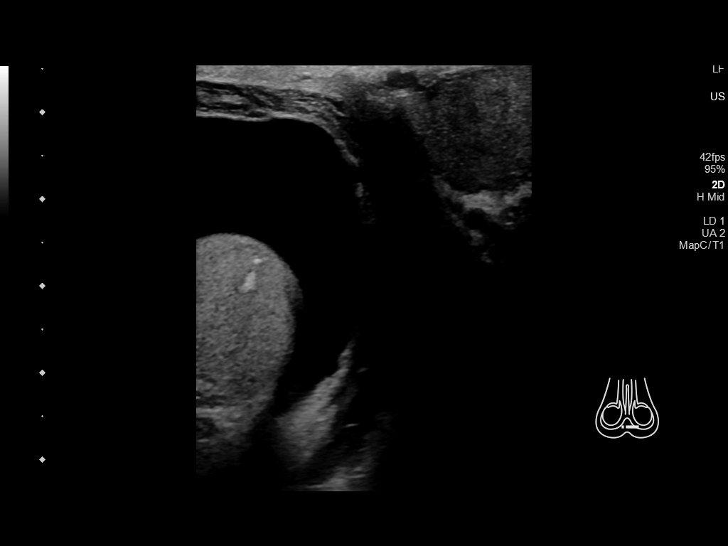
[im 5/55]
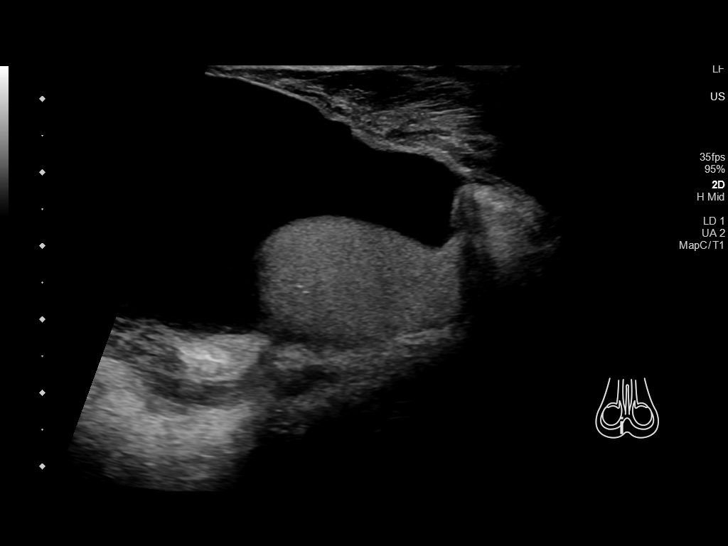
[im 10/55]
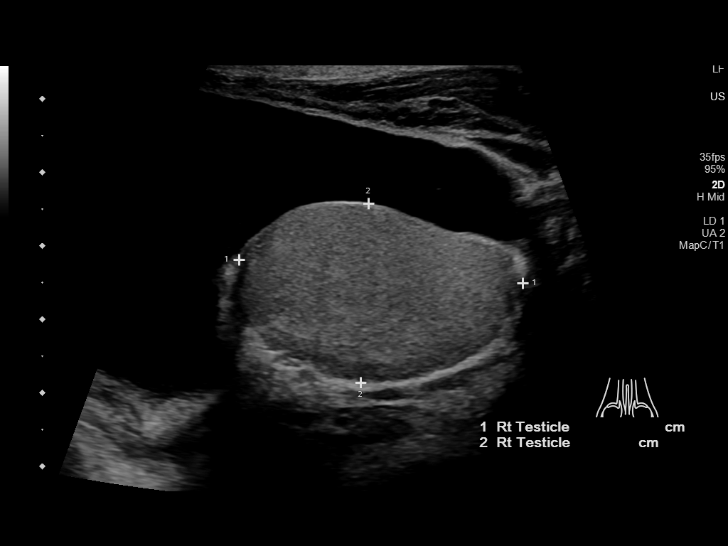
[im 14/55]
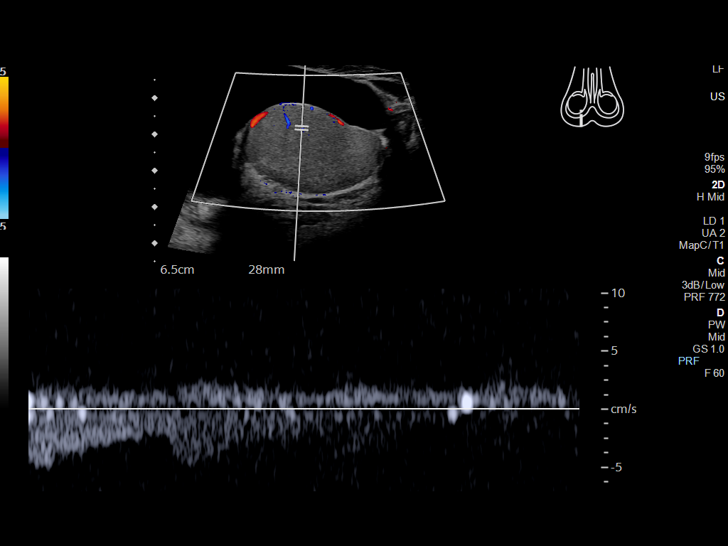
[im 19/55]
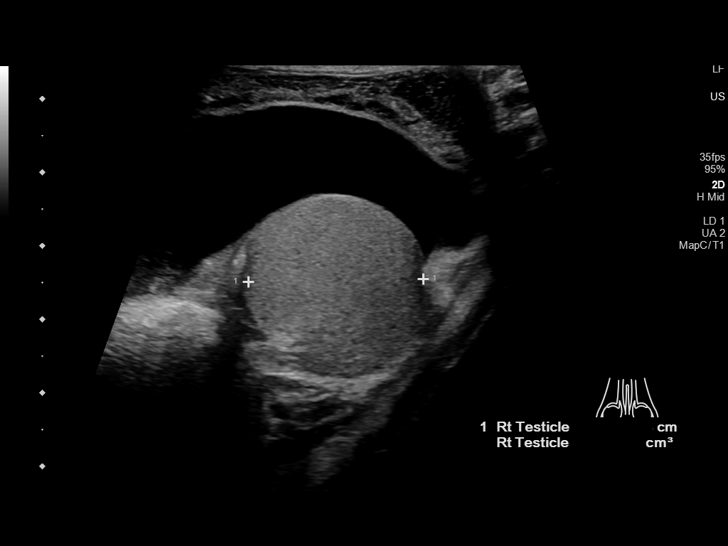
[im 21/55]
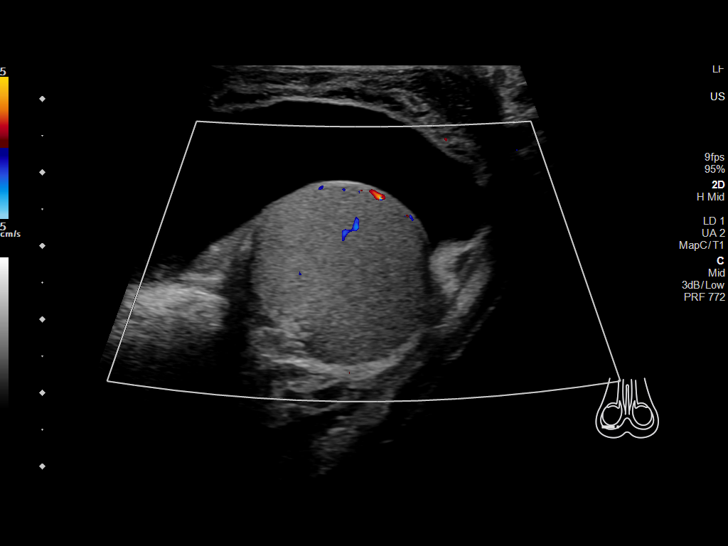
[im 25/55]
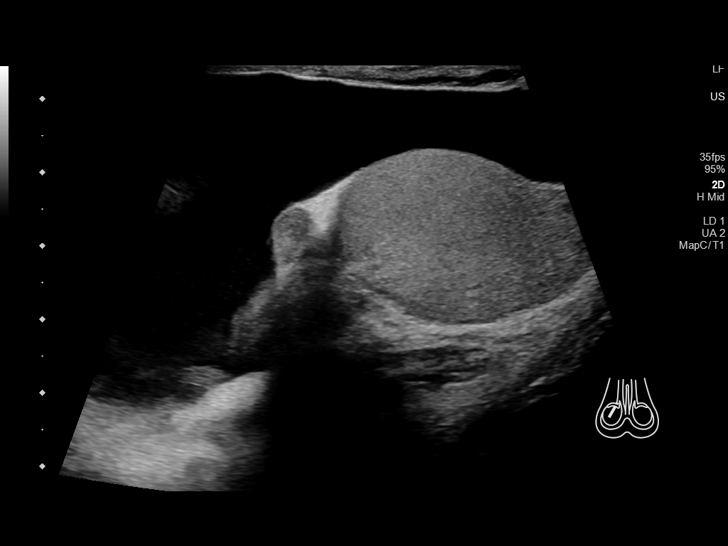
[im 30/55]
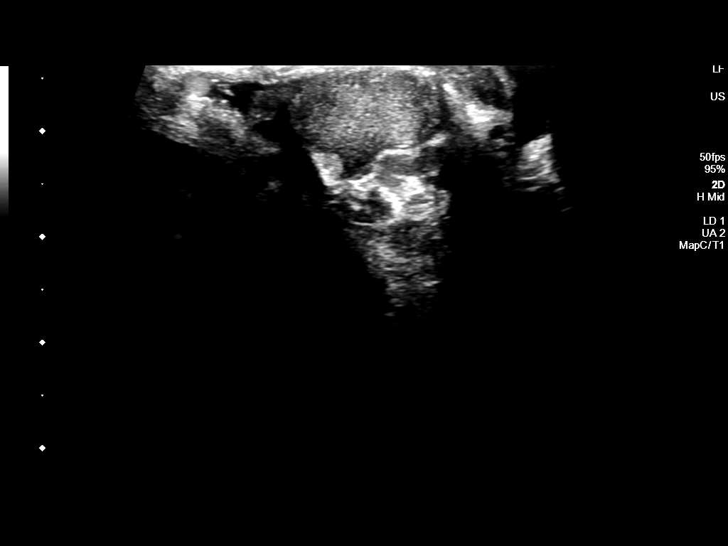
[im 34/55]
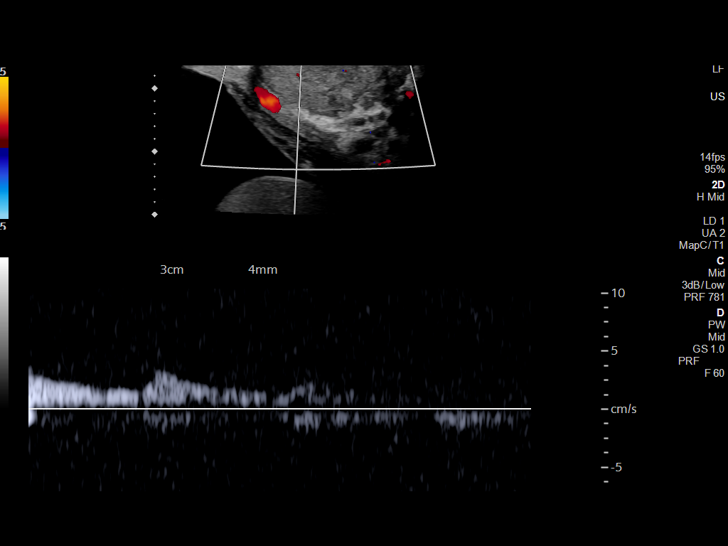
[im 37/55]
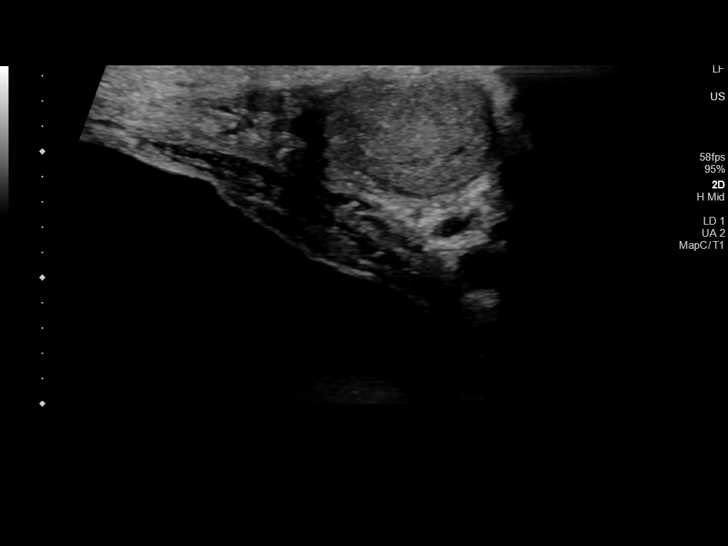
[im 41/55]
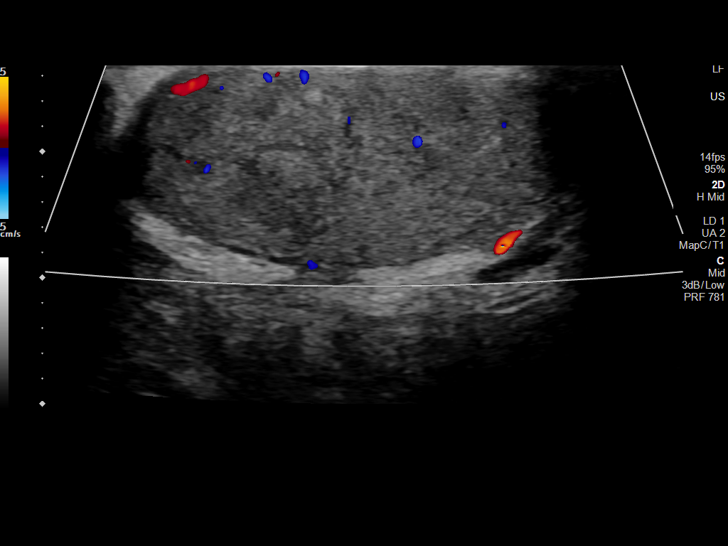
[im 46/55]
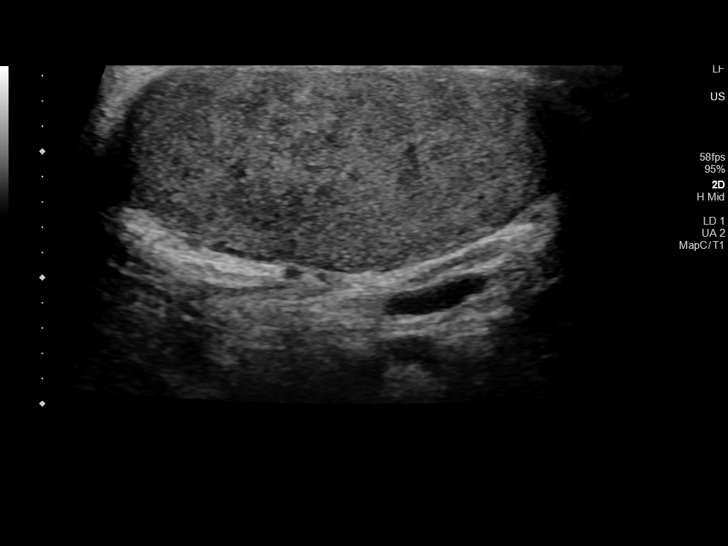
[im 50/55]
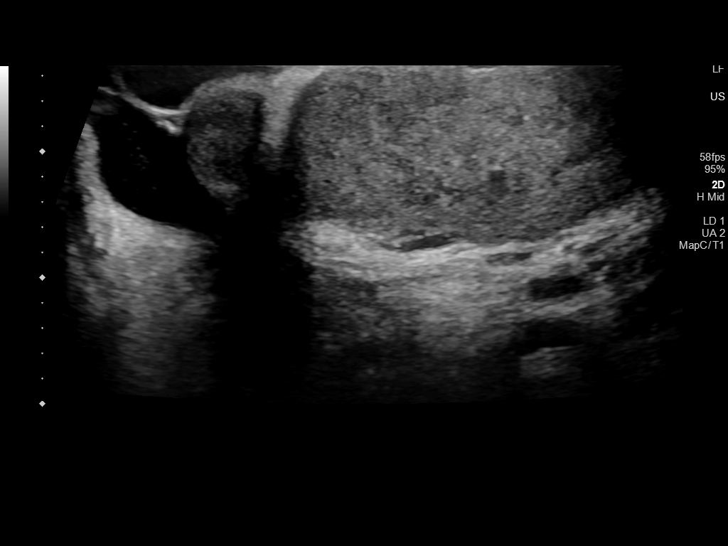
[im 55/55]
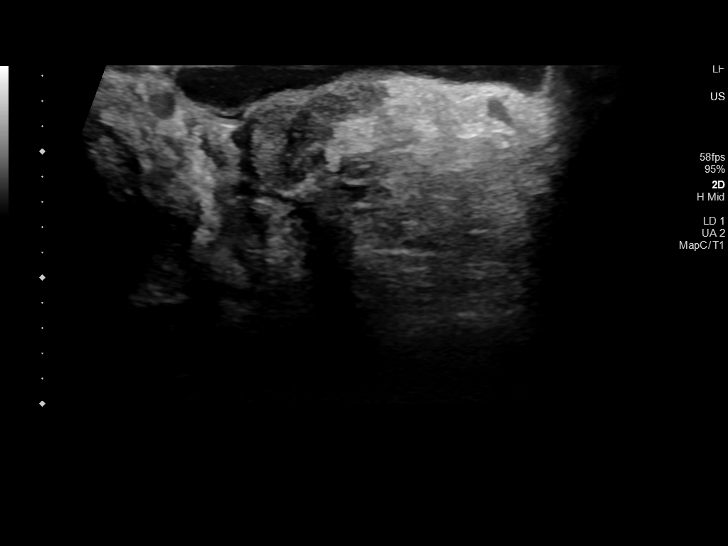

[14 of 25 positions shown; findings below may reference images not displayed]

FINDINGS: Right testicle

Measurements: 3.9 x 2.4 x 2.4 cm. No mass or microlithiasis
visualized.

Left testicle

Measurements: 3.6 x 1.7 x 2.1 cm. Heterogeneous parenchymal
appearance, unchanged. No mass or microlithiasis visualized.

Right epididymis:  Normal in size and appearance.

Left epididymis:  Normal in size and appearance.

Hydrocele: Moderate right hydrocele, similar versus mildly decreased
from the prior.

Varicocele:  None visualized.

Pulsed Doppler interrogation of both testes demonstrates normal low
resistance arterial and venous waveforms bilaterally.
IMPRESSION: Moderate right hydrocele, similar versus mildly decreased from the
prior.

Heterogeneous parenchymal appearance of the left testis, possibly
related to prior infection/trauma, chronic.

No evidence of testicular torsion.

## 2021-12-19 NOTE — Progress Notes (Deleted)
Patient ID: Curtis Saunders, male    DOB: Jun 02, 1955  MRN: [id number]  CC: No chief complaint on file.   Subjective: Curtis Saunders is a 66 y.o. male who presents for  His concerns today include:    swollen knee and kidney results   Cards - LVH, aneurysm, HTN  Neuro - stroke, HLD  Endo  - DM  [facility] - CKD Estab w/ Cards (LVH, aneurysm, HTN) Estab w/ Neph (CKD) Estab w/ Neuro (arterial ischemic stroke, HLD) Estab w/ Endo (DM) Estab w/ Uro (hematuria, urinary frequency, LUTS) Estab w/ Gastro (constipation, flank pain)   10/19/2021 with me  Acute pain of right knee - Ketorolac and triamcinolone acetonide injections administered in office.  - Referral to Orthopedic Surgery for further evaluation and management.  - ketorolac (TORADOL) injection 60 mg - triamcinolone acetonide (KENALOG-40) injection 40 mg - Ambulatory referral to Orthopedic Surgery  12/08/2021  Curtis Never, MD Provider [place] [facility]   I had a long discussion with the patient. He did have a fall onto this right knee with significant increase in pain since that time. He did not respond as well as I would have liked to the corticosteroid injection and conservative management. I am concerned is he developing a proximal medial tibia stress fracture. This may simply be a flare of osteoarthrosis, but is acting a little unusual. Discussed modified weightbearing with a cane. I discussed use of some anti-inflammatories. He would prefer to hold off on medication at this point in time. Follow-up after the MRI. All questions were encouraged and answered.  11/08/2021 11/08/2021 I had a long discussion with the patient. He did have a fall directly on the anterior knee which is resulted in some pain and inflammation. He does have some underlying osteoarthrosis as well. Fortunately, he is not having any mechanical symptoms. However, conservative management is not improving  his situation. We did perform a corticosteroid injection today which he tolerated without difficulty. I am going to provide him with home exercises. We discussed physical therapy, but he would prefer to try these on his own. He will continue the oral anti-inflammatories for now. He is a diabetic, but overall his blood sugars are well controlled. He will continue to monitor those and let me know if it becomes an issue. Follow-up in 3 to 4 weeks for recheck. Hopefully, he will be doing markedly better. If not, we may consider advanced imaging. All questions were encouraged and answered.    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 Swallow whole. 30 tablet 11   chlorthalidone (HYGROTON) 25 MG tablet Take 1 tablet by mouth once daily 90 tablet 3  docusate sodium (COLACE) 100 MG capsule Take 1 capsule (100 mg total) by mouth 2 (two) times daily. 60 capsule 2   ramipril (ALTACE) 10 MG capsule Take 1 capsule by mouth once daily 90 capsule 3   rosuvastatin (CRESTOR) 20 MG tablet Take 1 tablet (20 mg total) by mouth daily. 90 tablet 3   No current facility-administered medications on file prior to visit.    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     Social Determinants of Health   Financial Resource Strain: Not on file  Food Insecurity: Not on file  Transportation Needs: Not on file  Physical Activity: Not on file  Stress: Not on file  Social Connections: Not on file  Intimate Partner Violence: Not on file    Family History  Problem Relation Age of Onset   Heart attack Mother 14   COPD Mother    Emphysema Mother    Hypertension Sister    Sleep apnea Neg Hx     Past Surgical History:  Procedure Laterality Date   APPENDECTOMY     EYE SURGERY     HAND SURGERY     KNEE SURGERY     x3, x1 R   LIPOMA EXCISION Left 10/13/2019   Procedure: SHOULDER EXCISION LIPOMA AND ROTATOR CUFF REPAIR;  Surgeon: Tania Ade, MD;  Location: [facility];  Service: Orthopedics;  Laterality: Left;   TONSILLECTOMY      ROS: Review of Systems Negative except as stated above  PHYSICAL EXAM: There were no vitals taken for this visit.  Physical Exam  {male adult master:310786} {male adult master:310785}     Latest Ref Rng & Units 09/27/2021    2:28 PM 07/10/2021    1:48 PM 06/22/2021   10:11 AM  CMP  Glucose 70 - 99 mg/dL 122  101  111   BUN 8 - 27 mg/dL 33  23  31   Creatinine 0.76 - 1.27 mg/dL 2.75  1.50  1.74   Sodium 134 - 144 mmol/L 138  143  141   Potassium 3.5 - 5.2 mmol/L 3.6  3.1  3.9    Chloride 96 - 106 mmol/L 95  102  102   CO2 20 - 29 mmol/L 20   26   Calcium 8.6 - 10.2 mg/dL 9.5   9.9    Lipid Panel     Component Value Date/Time   CHOL 121 06/23/2021 1204   TRIG 89 06/23/2021 1204   HDL 45 06/23/2021 1204   CHOLHDL 2.7 06/23/2021 1204   CHOLHDL 4.0 02/28/2020 0248   VLDL 13 02/28/2020 0248   LDLCALC 59 06/23/2021 1204    CBC    Component Value Date/Time   WBC 7.6 12/14/2020 1329  WBC 9.9 03/19/2020 1417   RBC 4.56 12/14/2020 1329   RBC 4.65 03/19/2020 1417   HGB 13.6 07/10/2021 1348   HGB 13.4 12/14/2020 1329   HCT 40.0 07/10/2021 1348   HCT 39.7 12/14/2020 1329   PLT 247 12/14/2020 1329   MCV 87 12/14/2020 1329   MCH 29.4 12/14/2020 1329   MCH 29.2 03/19/2020 1417   MCHC 33.8 12/14/2020 1329   MCHC 32.7 03/19/2020 1417   RDW 13.4 12/14/2020 1329   LYMPHSABS 2.3 02/27/2020 0931   MONOABS 0.4 02/27/2020 0931   EOSABS 0.2 02/27/2020 0931   BASOSABS 0.0 02/27/2020 0931    ASSESSMENT AND PLAN:  There are no diagnoses linked to this encounter.   Patient was given the opportunity to ask questions.  Patient verbalized understanding of the plan and was able to repeat key elements of the plan. Patient was given clear instructions to go to Emergency Department or return to medical center if symptoms don't improve, worsen, or new problems develop.The patient verbalized understanding.   No orders of the defined types were placed in this encounter.    Requested Prescriptions    No prescriptions requested or ordered in this encounter    No follow-ups on file.  Camillia Herter, NP

## 2021-12-19 NOTE — Progress Notes (Deleted)
Patient ID: STEWARD SAMES, male    DOB: 06-10-1955  MRN: [id number]  CC: No chief complaint on file.   Subjective: Curtis Saunders is a 66 y.o. male who presents for  His concerns today include:   Patient estab with Ortho and recently seen for right knee. (In [place])  Patient estab with [facility]  Cards - LVH, aneurysm, HTN  Neuro - stroke, HLD  Endo  - DM  [facility] - CKD Estab w/ Cards (LVH, aneurysm, HTN) Estab w/ Neph (CKD) Estab w/ Neuro (arterial ischemic stroke, HLD) Estab w/ Endo (DM) Estab w/ Uro (hematuria, urinary frequency, LUTS) Estab w/ Gastro (constipation, flank pain)  Patient Active Problem List   Diagnosis Date Noted   Low back pain 07/14/2021   Pain in joint of left shoulder 08/12/2020   CKD (chronic kidney disease) stage 2, GFR 60-89 ml/min 04/12/2020   CVA (cerebral vascular accident) ([place]) 02/27/2020   Ascending aortic aneurysm ([place]) 02/11/2020   Double pterygium, 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   Essential hypertension 03/06/2018   LVH (left ventricular hypertrophy) 02/23/2018   HTN (hypertension), malignant 02/20/2018   Chest pain 02/20/2018     Current Outpatient Medications on File Prior to Visit  Medication Sig Dispense Refill   amLODipine (NORVASC) 10 MG tablet Take 1 tablet by mouth once daily 90 tablet 3   aspirin EC 81 MG EC tablet Take 1 tablet (81 mg total) by mouth daily. Swallow whole. 30 tablet 11   chlorthalidone (HYGROTON) 25 MG tablet Take 1 tablet by mouth once daily 90 tablet 3   docusate sodium (COLACE) 100 MG capsule Take 1 capsule (100 mg total) by mouth 2 (two) times daily. 60 capsule 2   ramipril (ALTACE) 10 MG capsule Take 1 capsule by mouth once daily 90 capsule 3   rosuvastatin (CRESTOR) 20 MG tablet Take 1 tablet (20 mg total) by mouth daily. 90 tablet 3   No current facility-administered medications on file prior to visit.    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     Social Determinants of Health   Financial Resource Strain: Not on file  Food Insecurity: Not on file  Transportation Needs: Not on file  Physical Activity: Not on file  Stress: Not on file  Social Connections: Not on file  Intimate Partner Violence: Not on file    Family History  Problem Relation Age of Onset   Heart attack Mother 1   COPD Mother    Emphysema Mother    Hypertension Sister    Sleep apnea Neg Hx     Past Surgical History:  Procedure Laterality Date   APPENDECTOMY     EYE SURGERY     HAND SURGERY  KNEE SURGERY     x3, x1 R   LIPOMA EXCISION Left 10/13/2019   Procedure: SHOULDER EXCISION LIPOMA AND ROTATOR CUFF REPAIR;  Surgeon: Tania Ade, MD;  Location: [place];  Service: Orthopedics;  Laterality: Left;   TONSILLECTOMY      ROS: Review of Systems Negative except as stated above  PHYSICAL EXAM: There were no vitals taken for this visit.  Physical Exam  {male adult master:310786} {male adult master:310785}     Latest Ref Rng & Units 09/27/2021    2:28 PM 07/10/2021    1:48 PM 06/22/2021   10:11 AM  CMP  Glucose 70 - 99 mg/dL 122  101  111   BUN 8 - 27 mg/dL 33  23  31   Creatinine 0.76 - 1.27 mg/dL 2.75  1.50  1.74   Sodium 134 - 144 mmol/L 138  143  141   Potassium 3.5 - 5.2 mmol/L 3.6  3.1  3.9   Chloride 96 - 106 mmol/L 95  102  102    CO2 20 - 29 mmol/L 20   26   Calcium 8.6 - 10.2 mg/dL 9.5   9.9    Lipid Panel     Component Value Date/Time   CHOL 121 06/23/2021 1204   TRIG 89 06/23/2021 1204   HDL 45 06/23/2021 1204   CHOLHDL 2.7 06/23/2021 1204   CHOLHDL 4.0 02/28/2020 0248   VLDL 13 02/28/2020 0248   LDLCALC 59 06/23/2021 1204    CBC    Component Value Date/Time   WBC 7.6 12/14/2020 1329   WBC 9.9 03/19/2020 1417   RBC 4.56 12/14/2020 1329   RBC 4.65 03/19/2020 1417   HGB 13.6 07/10/2021 1348   HGB 13.4 12/14/2020 1329   HCT 40.0 07/10/2021 1348   HCT 39.7 12/14/2020 1329   PLT 247 12/14/2020 1329   MCV 87 12/14/2020 1329   MCH 29.4 12/14/2020 1329   MCH 29.2 03/19/2020 1417   MCHC 33.8 12/14/2020 1329   MCHC 32.7 03/19/2020 1417   RDW 13.4 12/14/2020 1329   LYMPHSABS 2.3 02/27/2020 0931   MONOABS 0.4 02/27/2020 0931   EOSABS 0.2 02/27/2020 0931   BASOSABS 0.0 02/27/2020 0931    ASSESSMENT AND PLAN:  There are no diagnoses linked to this encounter.   Patient was given the opportunity to ask questions.  Patient verbalized understanding of the plan and was able to repeat key elements of the plan. Patient was given clear instructions to go to Emergency Department or return to medical center if symptoms don't improve, worsen, or new problems develop.The patient verbalized understanding.   No orders of the defined types were placed in this encounter.    Requested Prescriptions    No prescriptions requested or ordered in this encounter    No follow-ups on file.  Camillia Herter, NP

## 2021-12-20 ENCOUNTER — Ambulatory Visit: Payer: Medicaid Other | Admitting: Family

## 2021-12-20 ENCOUNTER — Telehealth: Payer: Self-pay

## 2021-12-20 IMAGING — MR MR HEAD W/O CM
9 of 11 series · 33 of 48 positions shown · non-contrast
Comparison: Prior head CT examinations 02/20/2019 and earlier.

CLINICAL DATA: Neuro deficit, acute, stroke suspected. Additional
history provided: Aphasia and right-sided facial droop.

EXAM:
MRI HEAD WITHOUT CONTRAST
TECHNIQUE: Multiplanar, multiecho pulse sequences of the brain and surrounding
structures were obtained without intravenous contrast.

[Series 7: T2 · sagittal · 5.0mm · 0.47mm/px · 2 of 24 slices shown (1 of 3)]
[im 1/24]
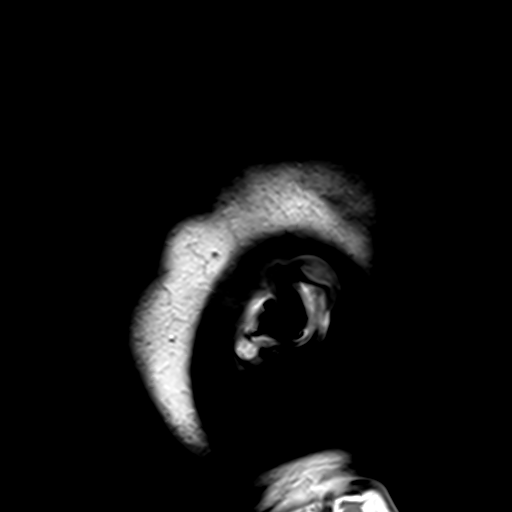
[im 24/24]
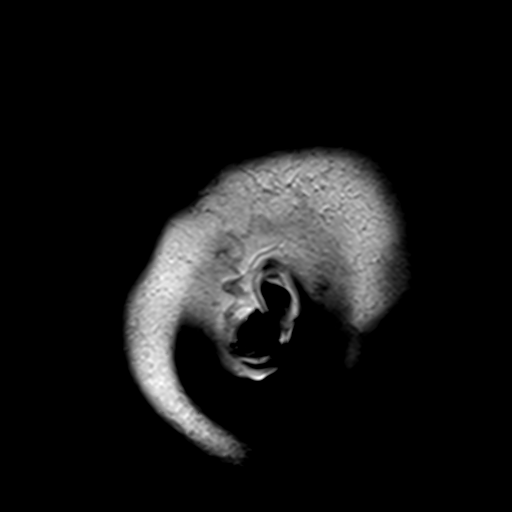

[Series 8: FLAIR · axial · 3.0mm · 0.86mm/px · z∈[-48,+106]mm · 5 of 53 slices shown]
[im 1/53]
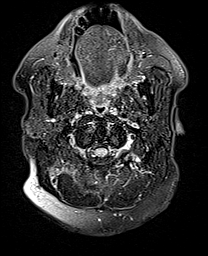
[im 14/53]
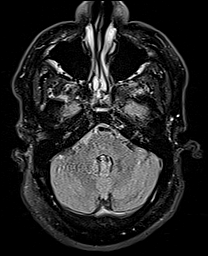
[im 27/53]
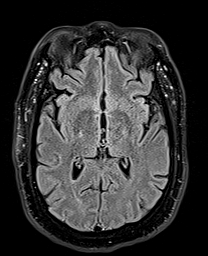
[im 40/53]
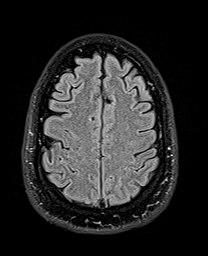
[im 53/53]
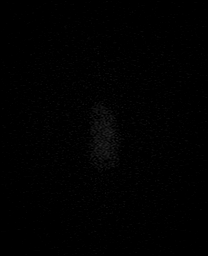

[Series 9: T2 · axial · 4.0mm · 0.45mm/px · z∈[-53,+116]mm · 3 of 34 slices shown (2 of 3)]
[im 1/34]
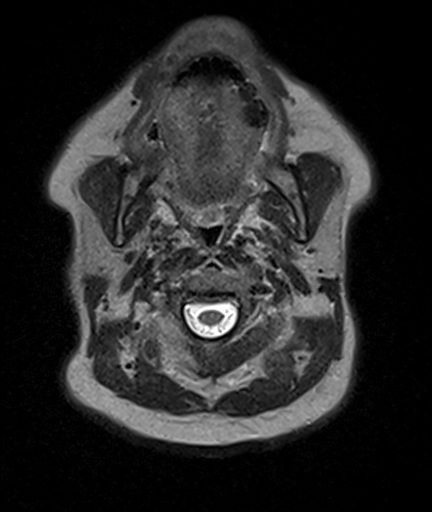
[im 17/34]
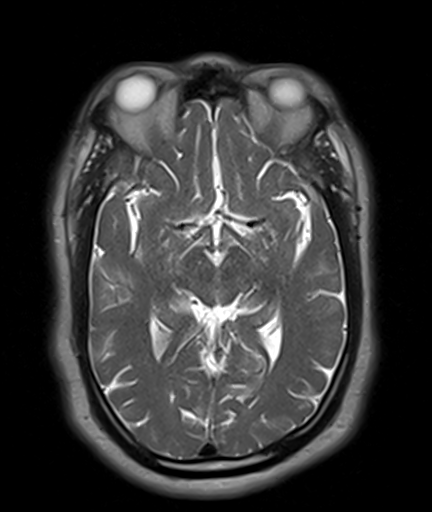
[im 34/34]
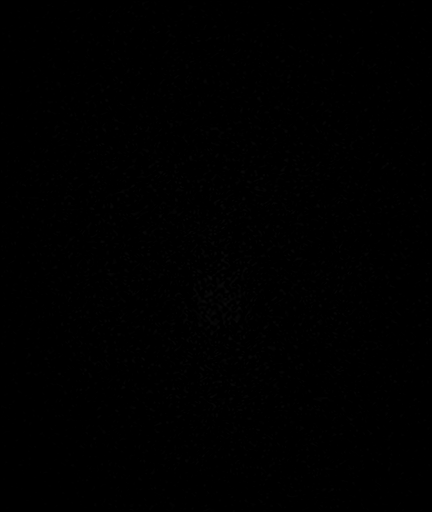

[Series 10: GRE · axial · 3.0mm · 0.45mm/px · z∈[-38,+115]mm · 5 of 53 slices shown]
[im 1/53]
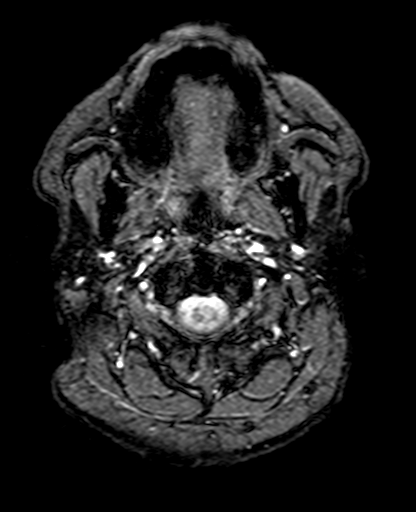
[im 14/53]
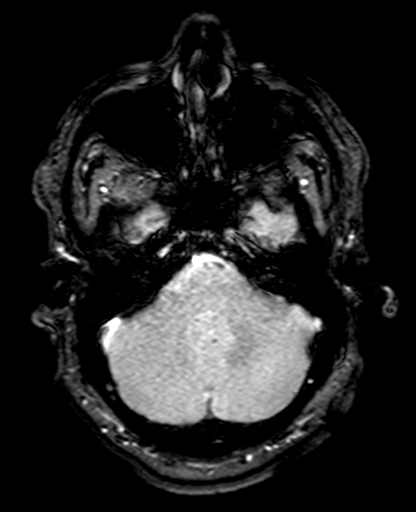
[im 27/53]
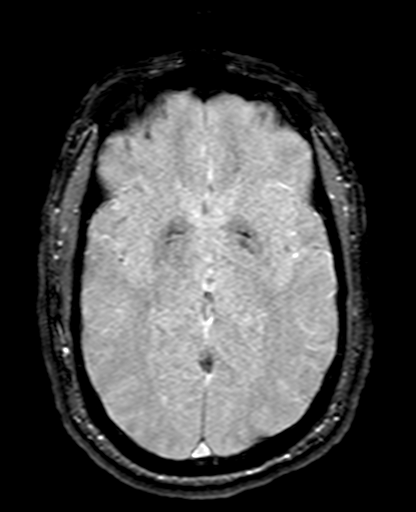
[im 40/53]
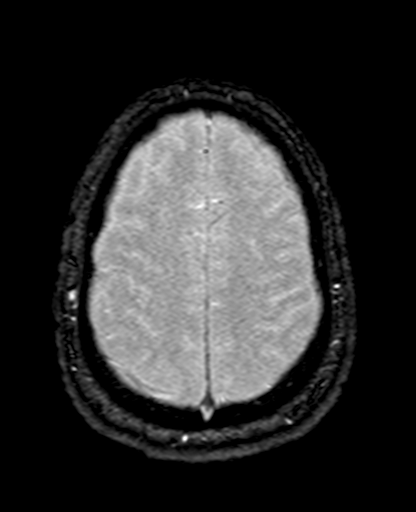
[im 53/53]
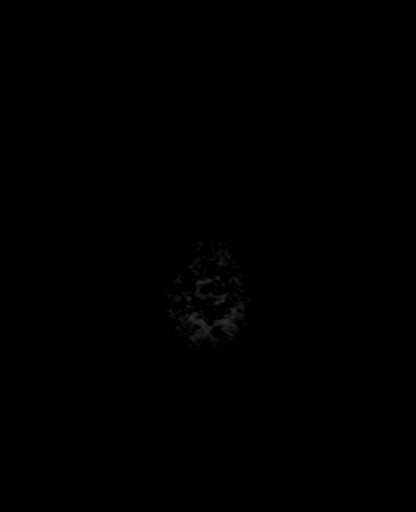

[Series 11: T1 · axial · 3.0mm · 0.45mm/px · z∈[-75,+75]mm · 5 of 51 slices shown (1 of 2)]
[im 1/51]
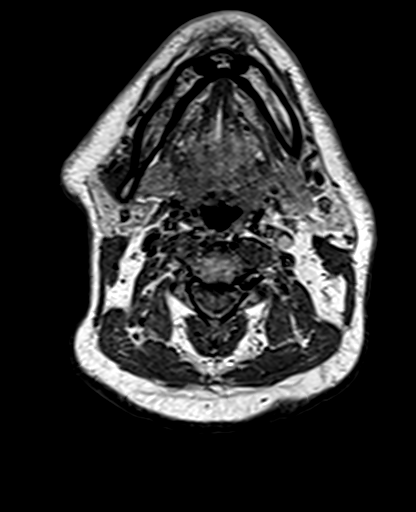
[im 13/51]
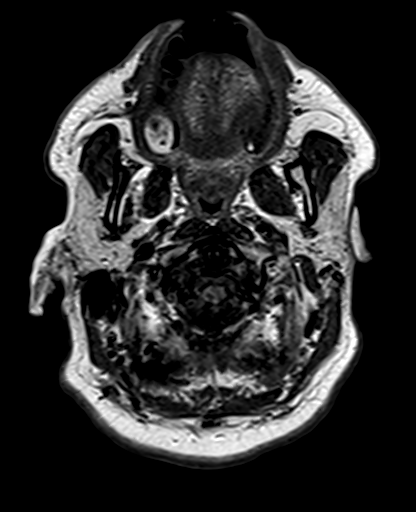
[im 26/51]
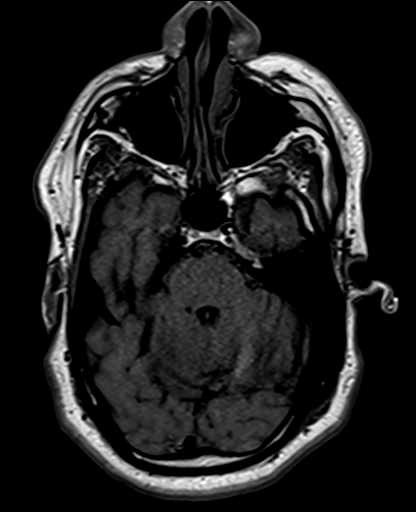
[im 38/51]
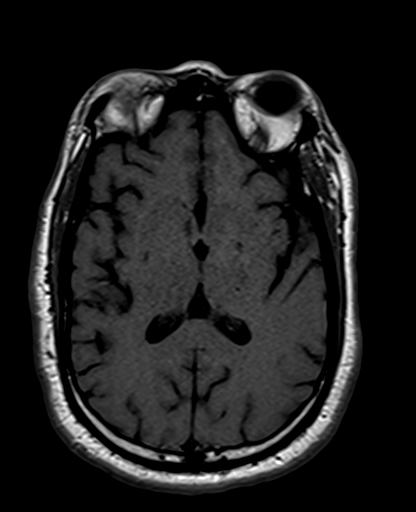
[im 51/51]
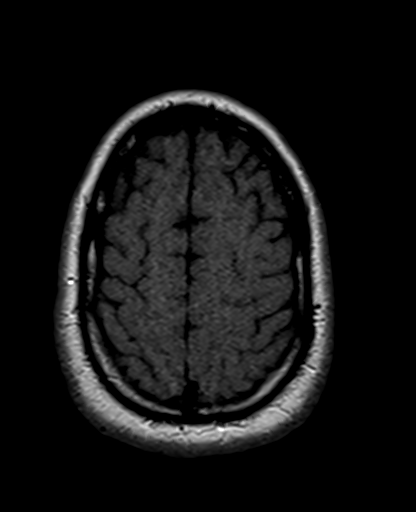

[Series 12: DWI · coronal · 5.0mm · 1.31mm/px · 5 of 60 slices shown (1 of 2)]
[im 1/60]
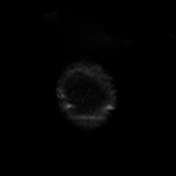
[im 15/60]
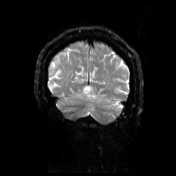
[im 30/60]
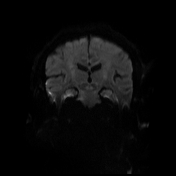
[im 45/60]
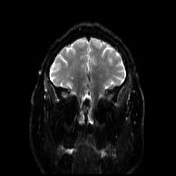
[im 60/60]
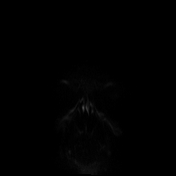

[Series 13: DWI · coronal · 5.0mm · 1.31mm/px · 3 of 30 slices shown (2 of 2)]
[im 1/30]
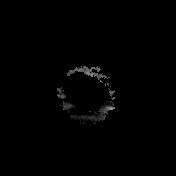
[im 15/30]
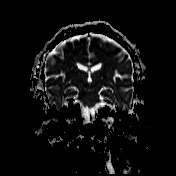
[im 30/30]
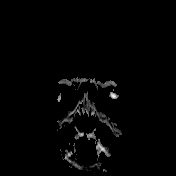

[Series 14: T2 · coronal · 5.0mm · 0.86mm/px · 2 of 28 slices shown (3 of 3)]
[im 1/28]
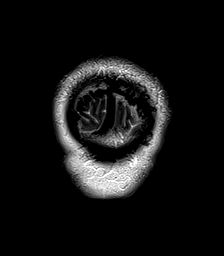
[im 28/28]
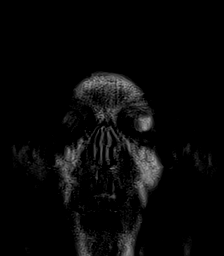

[Series 15: T1 · axial · 3.0mm · 0.45mm/px · z∈[-34,+39]mm · 3 of 51 slices shown (2 of 2)]
[im 1/51]
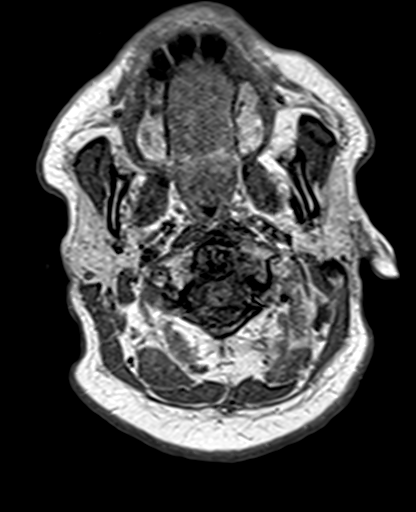
[im 13/51]
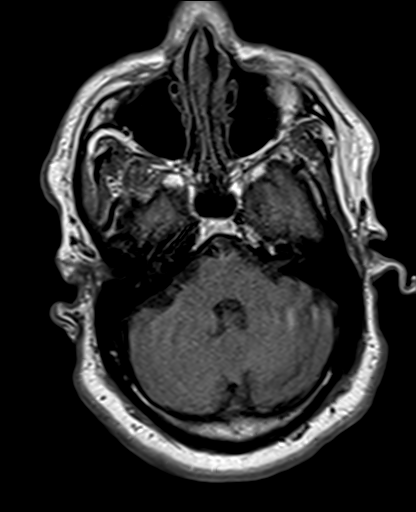
[im 26/51]
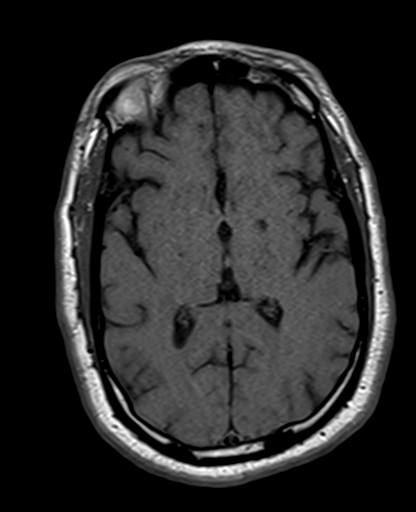

[33 of 48 positions shown; findings below may reference images not displayed]

FINDINGS: Brain:

Intermittently motion degraded exam. Most notably, there is moderate
motion degradation of the coronal T2 weighted sequence.

Cerebral volume is normal for age.

9 mm focus of restricted diffusion consistent with acute infarction
centered within the posterior limb of left internal capsule. This
acute infarct may also involve the adjacent left thalamus and
lentiform nucleus.

There are two small vessel infarcts within the left corona
radiata/basal ganglia, which appear new as compared to the prior
head CT of 02/20/2019.

Redemonstrated chronic lacunar infarct within the right corona
radiata.

Mild multifocal T2/FLAIR hyperintensity within the cerebral white
matter is nonspecific, but compatible with chronic small vessel
ischemic disease.

Chronic microhemorrhage within the right cerebellar hemisphere.
Additional subtle SWI signal loss within the left basal ganglia
which may reflect mineralization or chronic hemosiderin deposition.

No evidence of intracranial mass.

No extra-axial fluid collection.

No midline shift.

Vascular: Expected proximal arterial flow voids.

Skull and upper cervical spine: No focal marrow lesion.

Sinuses/Orbits: Visualized orbits show no acute finding. No
significant paranasal sinus disease.
IMPRESSION: Intermittently motion degraded exam.

9 mm acute infarct centered within the posterior limb of left
internal capsule. This infarct may also involve the adjacent left
thalamus and lentiform nucleus.

Two chronic small-vessel infarcts within the left corona
radiata/basal ganglia, new as compared to the head CT of 02/20/2019.

Redemonstrated chronic lacunar infarct within the right corona
radiata.

Background mild cerebral white matter chronic small vessel ischemic
disease.

## 2021-12-20 NOTE — Telephone Encounter (Signed)
Att to contact pt to advise that per Durene Fruits, NP if he is having issues with his knee he would need to follow-up w/orthopedic he seen on 12/08/21, pt has upcoming imaging appts for the chronic knee pain, nothing further we can do.  -if pt having kidney issues would need to follow-up w/[facility] for further evaluation. -unsuccessful contact made to pt to confirm what appt is for message left for pt to call back

## 2021-12-23 ENCOUNTER — Ambulatory Visit (INDEPENDENT_AMBULATORY_CARE_PROVIDER_SITE_OTHER): Payer: Medicaid Other

## 2021-12-23 ENCOUNTER — Telehealth: Payer: Self-pay | Admitting: *Deleted

## 2021-12-23 ENCOUNTER — Ambulatory Visit (INDEPENDENT_AMBULATORY_CARE_PROVIDER_SITE_OTHER): Payer: Medicaid Other | Admitting: Gastroenterology

## 2021-12-23 ENCOUNTER — Encounter: Payer: Self-pay | Admitting: Gastroenterology

## 2021-12-23 ENCOUNTER — Ambulatory Visit: Payer: Medicaid Other | Admitting: Family

## 2021-12-23 VITALS — BP 118/80 | HR 73 | Ht 71.0 in | Wt 229.4 lb

## 2021-12-23 DIAGNOSIS — I517 Cardiomegaly: Secondary | ICD-10-CM

## 2021-12-23 DIAGNOSIS — I63312 Cerebral infarction due to thrombosis of left middle cerebral artery: Secondary | ICD-10-CM

## 2021-12-23 DIAGNOSIS — Z23 Encounter for immunization: Secondary | ICD-10-CM

## 2021-12-23 DIAGNOSIS — Z1211 Encounter for screening for malignant neoplasm of colon: Secondary | ICD-10-CM | POA: Diagnosis not present

## 2021-12-23 DIAGNOSIS — I10 Essential (primary) hypertension: Secondary | ICD-10-CM

## 2021-12-23 MED ORDER — NA SULFATE-K SULFATE-MG SULF 17.5-3.13-1.6 GM/177ML PO SOLN
1.0000 | Freq: Once | ORAL | 0 refills | Status: AC
Start: 2021-12-23 — End: 2021-12-23

## 2021-12-23 NOTE — Progress Notes (Signed)
Shingrix administered left deltoid. Pt tolerated injection well

## 2021-12-23 NOTE — Progress Notes (Signed)
Chief Complaint: Colon cancer screening   Referring Provider:     Camillia Herter, NP    HPI:     Curtis Saunders is a 66 y.o. male with a history of diabetes, CVA 2022 (on ASA 81 mg), CKD 3, severe LVH, HTN, HLD, referred to the Gastroenterology Clinic for evaluation of colon cancer screening.  Thinks he had a colonoscopy many years ago. No report in EMR for review, but normal per patient.  Otherwise, recent GI symptoms.  No hematochezia, melena, change in bowel habits, abdominal pain.  No known family history of CRC, GI malignancy, liver disease, pancreatic disease, or IBD.   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  He follows with Dr. Percival Spanish in the Cardiology Clinic, last seen 05/24/2021 with plan for annual follow-up.   Past Medical History:  Diagnosis Date   Hypertension    Stroke [facility])      Past Surgical History:  Procedure Laterality Date   APPENDECTOMY     EYE SURGERY     HAND SURGERY     KNEE SURGERY     x3, x1 R   LIPOMA EXCISION Left 10/13/2019   Procedure: SHOULDER EXCISION LIPOMA AND ROTATOR CUFF REPAIR;  Surgeon: Tania Ade, MD;  Location: [place];  Service: Orthopedics;  Laterality: Left;   TONSILLECTOMY     Family History  Problem Relation Age of Onset   Heart attack Mother 56   COPD Mother    Emphysema Mother    Hypertension Sister    Sleep apnea Neg Hx    Social History   Tobacco Use   Smoking status: Never    Passive exposure: Never   Smokeless tobacco: Never  Vaping Use   Vaping Use: Never used  Substance Use Topics   Alcohol use: Not Currently   Drug use: No   Current Outpatient Medications  Medication Sig Dispense Refill   amLODipine (NORVASC) 10 MG tablet Take 1 tablet by mouth once daily 90 tablet 3   aspirin EC 81 MG EC tablet Take 1 tablet (81 mg total) by mouth daily. Swallow whole. 30 tablet 11   chlorthalidone  (HYGROTON) 25 MG tablet Take 1 tablet by mouth once daily 90 tablet 3   docusate sodium (COLACE) 100 MG capsule Take 1 capsule (100 mg total) by mouth 2 (two) times daily. 60 capsule 2   ramipril (ALTACE) 10 MG capsule Take 1 capsule by mouth once daily 90 capsule 3   rosuvastatin (CRESTOR) 20 MG tablet Take 1 tablet (20 mg total) by mouth daily. 90 tablet 3   No current facility-administered medications for this visit.   Allergies  Allergen Reactions   Bee Venom Anaphylaxis     Review of Systems: All systems reviewed and negative except where noted in HPI.     Physical Exam:    Wt Readings from Last 3 Encounters:  12/23/21 229 lb 6 oz (104 kg)  10/19/21 223 lb (101.2 kg)  09/27/21 221 lb (100.2 kg)    BP 118/80   Pulse 73   Ht '5\' 11"'$  (1.803 m)   Wt 229 lb 6 oz (104 kg)   BMI 31.99 kg/m  Constitutional:  Pleasant, in no acute distress. Psychiatric: Normal mood and affect. Behavior is normal. Cardiovascular: Normal rate, regular rhythm. No edema Pulmonary/chest: Effort normal and breath sounds normal. No wheezing, rales or rhonchi. Abdominal:  Soft, nondistended, nontender. Bowel sounds active throughout. There are no masses palpable. No hepatomegaly. Neurological: Alert and oriented to person place and time. Skin: Skin is warm and dry. No rashes noted.   ASSESSMENT AND PLAN;   1) Colon cancer screening Due for age-appropriate CRC screening.  No active GI symptoms. - Schedule colonoscopy  2) Left ventricular hypertrophy 3) Hypertension 4) History of CVA - Will request Cardiology clearance to proceed with colonoscopy - Ok to resume ASA 81 mg in the perioperative setting around colonoscopy - Will communicate with the Anesthesia service re: appropriateness of [facility] vs [facility] Endoscopy unit  The indications, risks, and benefits of colonoscopy were explained to the patient in detail. Risks include but are not limited to bleeding, perforation, adverse reaction to  medications, and cardiopulmonary compromise. Sequelae include but are not limited to the possibility of surgery, hospitalization, and mortality. The patient verbalized understanding and wished to proceed. All questions answered, referred to the scheduler and bowel prep ordered. Further recommendations pending results of the exam.     Lavena Bullion, DO, FACG  12/23/2021, 10:27 AM   Camillia Herter, NP

## 2021-12-23 NOTE — Telephone Encounter (Signed)
[facility] Pre-operative Risk Assessment     Request for surgical clearance:     Endoscopy Procedure  What type of surgery is being performed?     colonoscopy  When is this surgery scheduled?     Friday 02/17/22  What type of clearance is required ?   Cardiac  Practice name and name of physician performing surgery?      [facility]  What is your office phone and fax number?      Phone- [phone]  [phone]  Anesthesia type (None, local, MAC, general) ?       MAC

## 2021-12-23 NOTE — Patient Instructions (Addendum)
You have been scheduled for a colonoscopy. Please follow written instructions given to you at your visit today.  Please pick up your prep supplies at the pharmacy within the next 1-3 days. If you use inhalers (even only as needed), please bring them with you on the day of your procedure.  _______________________________________________________  If you are age 64 or older, your body mass index should be between 23-30. Your Body mass index is 31.99 kg/m. If this is out of the aforementioned range listed, please consider follow up with your Primary Care Provider.  If you are age 15 or younger, your body mass index should be between 19-25. Your Body mass index is 31.99 kg/m. If this is out of the aformentioned range listed, please consider follow up with your Primary Care Provider.   ________________________________________________________  The [facility] providers would like to encourage you to use [facility] to communicate with providers for non-urgent requests or questions.  Due to long hold times on the telephone, sending your provider a message by [facility] may be a faster and more efficient way to get a response.  Please allow 48 business hours for a response.  Please remember that this is for non-urgent requests.  _______________________________________________________

## 2021-12-23 NOTE — Telephone Encounter (Signed)
   Name: Curtis Saunders  DOB: Oct 21, 1955  MRN: [id number]  Primary Cardiologist: None   Preoperative team, please contact this patient and set up a phone call appointment for further preoperative risk assessment. Please obtain consent and complete medication review. Thank you for your help.  I confirm that guidance regarding antiplatelet and oral anticoagulation therapy has been completed and, if necessary, noted below.   (None Requested)   Deberah Pelton, NP 12/23/2021, 11:01 AM [place]

## 2021-12-23 NOTE — Telephone Encounter (Signed)
Pt scheduled for tele pre op appt 02/06/22 @ 3 pm. Med rec and consent are done.

## 2021-12-23 NOTE — Telephone Encounter (Signed)
Pt scheduled for tele pre op appt 02/06/22 @ 3 pm. Med rec and consent are done.     Patient Consent for Virtual Visit        Curtis Saunders has provided verbal consent on 12/23/2021 for a virtual visit (video or telephone).   CONSENT FOR VIRTUAL VISIT FOR:  Curtis Saunders  By participating in this virtual visit I agree to the following:  I hereby voluntarily request, consent and authorize [facility] and its employed or contracted physicians, physician assistants, nurse practitioners or other licensed health care professionals (the Practitioner), to provide me with telemedicine health care services (the "Services") as deemed necessary by the treating Practitioner. I acknowledge and consent to receive the Services by the Practitioner via telemedicine. I understand that the telemedicine visit will involve communicating with the Practitioner through live audiovisual communication technology and the disclosure of certain medical information by electronic transmission. I acknowledge that I have been given the opportunity to request an in-person assessment or other available alternative prior to the telemedicine visit and am voluntarily participating in the telemedicine visit.  I understand that I have the right to withhold or withdraw my consent to the use of telemedicine in the course of my care at any time, without affecting my right to future care or treatment, and that the Practitioner or I may terminate the telemedicine visit at any time. I understand that I have the right to inspect all information obtained and/or recorded in the course of the telemedicine visit and may receive copies of available information for a reasonable fee.  I understand that some of the potential risks of receiving the Services via telemedicine include:  Delay or interruption in medical evaluation due to technological equipment failure or disruption; Information transmitted may not be sufficient (e.g. poor  resolution of images) to allow for appropriate medical decision making by the Practitioner; and/or  In rare instances, security protocols could fail, causing a breach of personal health information.  Furthermore, I acknowledge that it is my responsibility to provide information about my medical history, conditions and care that is complete and accurate to the best of my ability. I acknowledge that Practitioner's advice, recommendations, and/or decision may be based on factors not within their control, such as incomplete or inaccurate data provided by me or distortions of diagnostic images or specimens that may result from electronic transmissions. I understand that the practice of medicine is not an exact science and that Practitioner makes no warranties or guarantees regarding treatment outcomes. I acknowledge that a copy of this consent can be made available to me via my patient portal ([facility]), or I can request a printed copy by calling the office of [facility].    I understand that my insurance will be billed for this visit.   I have read or had this consent read to me. I understand the contents of this consent, which adequately explains the benefits and risks of the Services being provided via telemedicine.  I have been provided ample opportunity to ask questions regarding this consent and the Services and have had my questions answered to my satisfaction. I give my informed consent for the services to be provided through the use of telemedicine in my medical care

## 2021-12-29 ENCOUNTER — Encounter: Payer: Self-pay | Admitting: Family

## 2022-01-09 DIAGNOSIS — M75110 Incomplete rotator cuff tear or rupture of unspecified shoulder, not specified as traumatic: Secondary | ICD-10-CM | POA: Insufficient documentation

## 2022-01-09 DIAGNOSIS — M25511 Pain in right shoulder: Secondary | ICD-10-CM | POA: Insufficient documentation

## 2022-01-27 ENCOUNTER — Ambulatory Visit: Payer: Medicaid Other | Admitting: Physician Assistant

## 2022-02-05 NOTE — Progress Notes (Signed)
Virtual Visit via Telephone Note   Because of Vicky B Zehnder's co-morbid illnesses, he is at least at moderate risk for complications without adequate follow up.  This format is felt to be most appropriate for this patient at this time.  The patient did not have access to video technology/had technical difficulties with video requiring transitioning to audio format only (telephone).  All issues noted in this document were discussed and addressed.  No physical exam could be performed with this format.  Please refer to the patient's chart for his consent to telehealth for [facility].  Evaluation Performed:  Preoperative cardiovascular risk assessment _____________   Date:  02/05/2022   Patient ID:  Curtis Saunders, DOB 07-19-1955, MRN [id number] Patient Location:  Home Provider location:   Office  Primary Care Provider:  Camillia Herter, NP Primary Cardiologist:  None  Chief Complaint / Patient Profile   67 y.o. y/o male with a h/o CVA 02/2020, aortic aneurysm, CKD stage II, LVH, HTN who is pending colonoscopy and presents today for telephonic preoperative cardiovascular risk assessment.  History of Present Illness    Curtis Saunders is a 67 y.o. male who presents via audio/video conferencing for a telehealth visit today.  Pt was last seen in cardiology clinic on 05/24/2021 by Dr. Percival Spanish.  At that time Curtis Saunders was doing well with blood pressure at target and no medication changes indicated, stable aortic aneurysm at 4.0 cm.  The patient is now pending procedure as outlined above. Since his last visit, he ***  -None requested to hold  Past Medical History    Past Medical History:  Diagnosis Date   Hypertension    Stroke [facility])    Past Surgical History:  Procedure Laterality Date   APPENDECTOMY     EYE SURGERY     HAND SURGERY     KNEE SURGERY     x3, x1 R   LIPOMA EXCISION Left 10/13/2019   Procedure: SHOULDER EXCISION LIPOMA AND ROTATOR CUFF REPAIR;   Surgeon: Tania Ade, MD;  Location: [place];  Service: Orthopedics;  Laterality: Left;   TONSILLECTOMY      Allergies  Allergies  Allergen Reactions   Bee Venom Anaphylaxis    Home Medications    Prior to Admission medications   Medication Sig Start Date End Date Taking? Authorizing Provider  amLODipine (NORVASC) 10 MG tablet Take 1 tablet by mouth once daily Patient not taking: Reported on 12/23/2021 09/12/21   Minus Breeding, MD  aspirin EC 81 MG EC tablet Take 1 tablet (81 mg total) by mouth daily. Swallow whole. 02/28/20   Barb Merino, MD  chlorthalidone (HYGROTON) 25 MG tablet Take 1 tablet by mouth once daily 09/12/21   Minus Breeding, MD  ramipril (ALTACE) 10 MG capsule Take 1 capsule by mouth once daily 09/12/21   Minus Breeding, MD  rosuvastatin (CRESTOR) 20 MG tablet Take 1 tablet (20 mg total) by mouth daily. 12/15/20   Frann Rider, NP    Physical Exam    Vital Signs:  Curtis Saunders does not have vital signs available for review today.***  Given telephonic nature of communication, physical exam is limited. AAOx3. NAD. Normal affect.  Speech and respirations are unlabored.  Accessory Clinical Findings    None  Assessment & Plan    1.  Preoperative Cardiovascular Risk Assessment: -{Click Here to Calculate RCRI      :382505397}  { Click Here to Calculate DASI      :  023343568} {Select to add RCRI Risk (<1%=LOW; >/=1%=HIGH) (Optional):21036017}  {Select if HIGH (RCRI >/=1%) Risk (Optional):21036030} Recommendations: {2014 ACC/AHA Perioperative Guidelines  :21036001} Antiplatelet and/or Anticoagulation Recommendations: {Antiplatelet Recommendations                  :21036016} {Anticoagulation Recommendations           :61683729}    The patient was advised that if he develops new symptoms prior to surgery to contact our office to arrange for a follow-up visit, and he verbalized understanding.  (Reminder: Include SBE  prophylaxis/Antiplatelet/Anticoag Instructions***)  A copy of this note will be routed to requesting surgeon.  Time:   Today, I have spent *** minutes with the patient with telehealth technology discussing medical history, symptoms, and management plan.     Mable Fill, Marissa Nestle, NP  02/05/2022, 9:27 PM

## 2022-02-06 ENCOUNTER — Ambulatory Visit: Payer: Medicaid Other | Attending: Internal Medicine | Admitting: Nurse Practitioner

## 2022-02-06 DIAGNOSIS — Z01810 Encounter for preprocedural cardiovascular examination: Secondary | ICD-10-CM

## 2022-02-07 ENCOUNTER — Telehealth: Payer: Self-pay

## 2022-02-07 NOTE — Telephone Encounter (Signed)
Copied from [place] [id number]. Topic: Referral - Request for Referral >> Feb 06, 2022  3:08 PM Curtis Saunders wrote:   Reason for CRM: Patient is requesting Saunders referral to Saunders urologist. Patient is requesting Saunders referral as soon as possible.   Has patient seen PCP for this complaint? Yes.   *If NO, is insurance requiring patient see PCP for this issue before PCP can refer them? Referral for which specialty: urologist Preferred provider/office: Pt requesting PCP to recommend Saunders provider  Reason for referral: Patient is having issues going to the bathroom multiple times Saunders day  Spoke w/referral coordinator at [facility] pt scheduled for an appt 02/08/22

## 2022-02-09 NOTE — Telephone Encounter (Signed)
Patient canceled procedure.

## 2022-02-17 ENCOUNTER — Encounter: Payer: Medicaid Other | Admitting: Gastroenterology

## 2022-02-22 ENCOUNTER — Ambulatory Visit: Payer: Self-pay | Admitting: *Deleted

## 2022-02-22 ENCOUNTER — Telehealth: Payer: Self-pay | Admitting: Cardiology

## 2022-02-22 NOTE — Telephone Encounter (Signed)
Pt c/o medication issue:  1. Name of Medication:  chlorthalidone (HYGROTON) 25 MG tablet Furosemide  2. How are you currently taking this medication (dosage and times per day)?   3. Are you having a reaction (difficulty breathing--STAT)?   4. What is your medication issue?   Patient would like to know why he is taking chlorthalidone. Patient also mentions that he does not see Furosemide on his list and he would like to know if he should still be taking it.

## 2022-02-22 NOTE — Telephone Encounter (Signed)
  Chief Complaint: constipation Symptoms: no BM since Monday- patient states he has tried OTC treatment Frequency: last BM Monday Pertinent Negatives: Patient denies abdominal pain, bloating, fever Disposition: '[]'$ ED /'[]'$ Urgent Care (no appt availability in office) / '[x]'$ Appointment(In office/virtual)/ '[]'$  [place] [facility]/ '[]'$ Home Care/ '[]'$ Refused Recommended Disposition /'[]'$ [place] [facility]/ '[]'$  Follow-up with PCP Additional Notes: Patient feels SE of new medication- patient advised not listed as common SE- but encouraged to call urologist to report and see if they would like to change Rx. Patient also states he is not sure what medications he is supposed to be taking- reviewed current medication list and he is missing one medication- he will call cardiologist regarding that medication(number provided)

## 2022-02-22 NOTE — Telephone Encounter (Signed)
Summary: constipation due to Rx   Pt stated he has been experiencing constipation since he has been on the medication Tamsulosin '4MG'$ . Stated his urologist prescribed this medication for him and has been constipated for a week. Pt denied abdominal pain.  Scheduled pt for first available 02/01 with Dr.Wilson.  Pt seeking clinical advice.     Reason for Disposition  Last bowel movement (BM) > 4 days ago  Answer Assessment - Initial Assessment Questions 1. STOOL PATTERN OR FREQUENCY: "How often do you have a bowel movement (BM)?"  (Normal range: 3 times a day to every 3 days)  "When was your last BM?"       2-3 times/day, last BM- Monday 2. STRAINING: "Do you have to strain to have a BM?"      no 3. RECTAL PAIN: "Does your rectum hurt when the stool comes out?" If Yes, ask: "Do you have hemorrhoids? How bad is the pain?"  (Scale 1-10; or mild, moderate, severe)     no 4. STOOL COMPOSITION: "Are the stools hard?"      no 5. BLOOD ON STOOLS: "Has there been any blood on the toilet tissue or on the surface of the BM?" If Yes, ask: "When was the last time?"     no 6. CHRONIC CONSTIPATION: "Is this a new problem for you?"  If No, ask: "How long have you had this problem?" (days, weeks, months)      No 7. CHANGES IN DIET OR HYDRATION: "Have there been any recent changes in your diet?" "How much fluids are you drinking on a daily basis?"  "How much have you had to drink today?"     No ,  patient drinking water all day 8. MEDICINES: "Have you been taking any new medicines?" "Are you taking any narcotic pain medicines?" (e.g., Dilaudid, morphine, Percocet, Vicodin)     Tamsulosin- new medication- bad taste in mouth- dry mouth, constipation - been taking 1 week 9. LAXATIVES: "Have you been using any stool softeners, laxatives, or enemas?"  If Yes, ask "What, how often, and when was the last time?"     Stool softener- not difference, patient took laxative also- no BM- magnesium laxative 10. ACTIVITY:   "How much walking do you do every day?"  "Has your activity level decreased in the past week?"        Patient is active- does get fatigued 11. CAUSE: "What do you think is causing the constipation?"        Not sure - thinks medication related 12. OTHER SYMPTOMS: "Do you have any other symptoms?" (e.g., abdomen pain, bloating, fever, vomiting)       no  Protocols used: Constipation-A-AH

## 2022-02-22 NOTE — Telephone Encounter (Signed)
Patient stated he would like to talk the MD or the RN pertaining to his medications. Denies any swelling in the lower and upper extremities, chest pain  and shortness of breath. Will forward to MD and nurse.

## 2022-02-23 ENCOUNTER — Ambulatory Visit (INDEPENDENT_AMBULATORY_CARE_PROVIDER_SITE_OTHER): Payer: Medicaid Other | Admitting: Family Medicine

## 2022-02-23 VITALS — BP 128/89 | HR 77 | Temp 98.1°F | Resp 16 | Wt 212.8 lb

## 2022-02-23 DIAGNOSIS — N189 Chronic kidney disease, unspecified: Secondary | ICD-10-CM

## 2022-02-23 DIAGNOSIS — R5383 Other fatigue: Secondary | ICD-10-CM

## 2022-02-23 DIAGNOSIS — K5900 Constipation, unspecified: Secondary | ICD-10-CM | POA: Diagnosis not present

## 2022-02-23 DIAGNOSIS — I10 Essential (primary) hypertension: Secondary | ICD-10-CM | POA: Diagnosis not present

## 2022-02-23 NOTE — Progress Notes (Signed)
Patient came with c/o constipation for 1 wk. Patient has tried different OTC meds with no relief.

## 2022-02-23 NOTE — Progress Notes (Signed)
Established Patient Office Visit  Subjective    Patient ID: Curtis Saunders, male    DOB: 12/25/1955  Age: 67 y.o. MRN: [id number]  CC: No chief complaint on file.   HPI Curtis Saunders wit complaint of constipation since he started flomax. Patient has been taking OTC preps with no relief. Patient also complaints of fatigue.    Outpatient Encounter Medications as of 02/23/2022  Medication Sig   amLODipine (NORVASC) 10 MG tablet Take 1 tablet by mouth once daily   aspirin EC 81 MG EC tablet Take 1 tablet (81 mg total) by mouth daily. Swallow whole.   furosemide (LASIX) 20 MG tablet Take 20 mg by mouth daily.   rosuvastatin (CRESTOR) 20 MG tablet Take 1 tablet (20 mg total) by mouth daily.   tamsulosin (FLOMAX) 0.4 MG CAPS capsule Take 1 capsule by mouth daily.   chlorthalidone (HYGROTON) 25 MG tablet Take 1 tablet by mouth once daily (Patient not taking: Reported on 02/23/2022)   ramipril (ALTACE) 10 MG capsule Take 1 capsule by mouth once daily (Patient not taking: Reported on 02/23/2022)   No facility-administered encounter medications on file as of 02/23/2022.    Past Medical History:  Diagnosis Date   Hypertension    Stroke [facility])     Past Surgical History:  Procedure Laterality Date   APPENDECTOMY     EYE SURGERY     HAND SURGERY     KNEE SURGERY     x3, x1 R   LIPOMA EXCISION Left 10/13/2019   Procedure: SHOULDER EXCISION LIPOMA AND ROTATOR CUFF REPAIR;  Surgeon: Tania Ade, MD;  Location: [facility];  Service: Orthopedics;  Laterality: Left;   TONSILLECTOMY      Family History  Problem Relation Age of Onset   Heart attack Mother 51   COPD Mother    Emphysema Mother    Hypertension Sister    Sleep apnea Neg Hx     Social History   Socioeconomic History   Marital status: Single    Spouse name: Not on file   Number of children: Not on file   Years of education: Not on file   Highest education level: Not on file  Occupational  History   Not on file  Tobacco Use   Smoking status: Never    Passive exposure: Never   Smokeless tobacco: Never  Vaping Use   Vaping Use: Never used  Substance and Sexual Activity   Alcohol use: Not Currently   Drug use: No   Sexual activity: Yes  Other Topics Concern   Not on file  Social History Narrative   ** Merged History Encounter **       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     Social Determinants of Health   Financial Resource Strain: Not on file  Food Insecurity: Not on file  Transportation Needs: Not on file  Physical Activity: Not on file  Stress: Not on file  Social Connections: Not on file  Intimate Partner Violence: Not on file    Review of Systems  Constitutional:  Positive for malaise/fatigue. Negative for chills and fever.  Gastrointestinal:  Positive for constipation.  All other systems reviewed and are negative.       Objective    BP 128/89   Pulse 77   Temp 98.1 F (36.7 C) (Oral)   Resp 16   Wt 212 lb 12.8 oz (96.5 kg)   SpO2 97%   BMI 29.68 kg/m  Physical Exam Vitals and nursing note reviewed.  Constitutional:      General: He is not in acute distress. Cardiovascular:     Rate and Rhythm: Normal rate and regular rhythm.  Pulmonary:     Effort: Pulmonary effort is normal.     Breath sounds: Normal breath sounds.  Abdominal:     Palpations: Abdomen is soft.     Tenderness: There is no abdominal tenderness.  Neurological:     General: No focal deficit present.     Mental Status: He is alert and oriented to person, place, and time.         Assessment & Plan:   1. Constipation, unspecified constipation type Will d/c flomax. Discussed adequate hydration and fiber  2. HTN (hypertension), malignant Appears stable. continue - Basic Metabolic Panel - CBC with Differential  3. Other fatigue Monitoring labs ordered - Basic Metabolic Panel - CBC with Differential  4. Chronic renal failure, unspecified CKD stage As  above - Basic Metabolic Panel - CBC with Differential  Return in about 3 months (around 05/24/2022) for follow up.   Becky Sax, MD

## 2022-02-24 ENCOUNTER — Encounter: Payer: Self-pay | Admitting: Family Medicine

## 2022-02-24 LAB — CBC WITH DIFFERENTIAL/PLATELET
Basophils Absolute: 0 10*3/uL (ref 0.0–0.2)
Basos: 0 %
EOS (ABSOLUTE): 0.1 10*3/uL (ref 0.0–0.4)
Eos: 1 %
Hematocrit: 39.3 % (ref 37.5–51.0)
Hemoglobin: 12.5 g/dL — ABNORMAL LOW (ref 13.0–17.7)
Immature Grans (Abs): 0.1 10*3/uL (ref 0.0–0.1)
Immature Granulocytes: 1 %
Lymphocytes Absolute: 2.4 10*3/uL (ref 0.7–3.1)
Lymphs: 23 %
MCH: 28.7 pg (ref 26.6–33.0)
MCHC: 31.8 g/dL (ref 31.5–35.7)
MCV: 90 fL (ref 79–97)
Monocytes Absolute: 0.5 10*3/uL (ref 0.1–0.9)
Monocytes: 4 %
Neutrophils Absolute: 7.3 10*3/uL — ABNORMAL HIGH (ref 1.4–7.0)
Neutrophils: 71 %
Platelets: 185 10*3/uL (ref 150–450)
RBC: 4.35 x10E6/uL (ref 4.14–5.80)
RDW: 13.2 % (ref 11.6–15.4)
WBC: 10.4 10*3/uL (ref 3.4–10.8)

## 2022-02-24 LAB — BASIC METABOLIC PANEL
BUN/Creatinine Ratio: 18 (ref 10–24)
BUN: 28 mg/dL — ABNORMAL HIGH (ref 8–27)
CO2: 20 mmol/L (ref 20–29)
Calcium: 10.3 mg/dL — ABNORMAL HIGH (ref 8.6–10.2)
Chloride: 102 mmol/L (ref 96–106)
Creatinine, Ser: 1.58 mg/dL — ABNORMAL HIGH (ref 0.76–1.27)
Glucose: 390 mg/dL — ABNORMAL HIGH (ref 70–99)
Potassium: 4.6 mmol/L (ref 3.5–5.2)
Sodium: 140 mmol/L (ref 134–144)
eGFR: 49 mL/min/{1.73_m2} — ABNORMAL LOW (ref >59)

## 2022-02-24 NOTE — Telephone Encounter (Signed)
Spoke with pt, he has not been taking chlorthalidone or ramipril. He reports his kidney doctor took him off those meds. Patient is going to call back and let us know exactly what he is taking. He is also going to provide a weeks worth of blood pressure readings for dr hochrein to review.

## 2022-02-25 ENCOUNTER — Other Ambulatory Visit: Payer: Self-pay | Admitting: Adult Health

## 2022-02-27 ENCOUNTER — Telehealth: Payer: Self-pay | Admitting: Cardiology

## 2022-02-27 MED ORDER — RAMIPRIL 10 MG PO CAPS: 10.0000 mg | ORAL_CAPSULE | Freq: Every day | ORAL | 3 refills | Status: DC

## 2022-02-27 NOTE — Telephone Encounter (Signed)
*  STAT* If patient is at the pharmacy, call can be transferred to refill team.   1. Which medications need to be refilled? (please list name of each medication and dose if known) ramipril (ALTACE) 10 MG capsule   2. Which pharmacy/location (including street and city if local pharmacy) is medication to be sent to? [place] ([place]),  - [address]   3. Do they need a 30 day or 90 day supply? 90 day  Patient states he is completely out of medication.

## 2022-02-28 ENCOUNTER — Encounter (HOSPITAL_COMMUNITY): Payer: Self-pay | Admitting: Internal Medicine

## 2022-02-28 ENCOUNTER — Ambulatory Visit: Payer: Self-pay

## 2022-02-28 ENCOUNTER — Inpatient Hospital Stay (HOSPITAL_COMMUNITY)
Admission: EM | Admit: 2022-02-28 | Discharge: 2022-03-02 | DRG: 638 | Disposition: A | Discharge status: Home / Self Care | Payer: Medicaid Other | Attending: Internal Medicine | Admitting: Internal Medicine

## 2022-02-28 ENCOUNTER — Other Ambulatory Visit: Payer: Self-pay

## 2022-02-28 DIAGNOSIS — R739 Hyperglycemia, unspecified: Principal | ICD-10-CM

## 2022-02-28 DIAGNOSIS — Z825 Family history of asthma and other chronic lower respiratory diseases: Secondary | ICD-10-CM

## 2022-02-28 DIAGNOSIS — E1122 Type 2 diabetes mellitus with diabetic chronic kidney disease: Secondary | ICD-10-CM | POA: Diagnosis present

## 2022-02-28 DIAGNOSIS — N183 Chronic kidney disease, stage 3 unspecified: Secondary | ICD-10-CM | POA: Diagnosis present

## 2022-02-28 DIAGNOSIS — I10 Essential (primary) hypertension: Secondary | ICD-10-CM | POA: Diagnosis present

## 2022-02-28 DIAGNOSIS — N1831 Chronic kidney disease, stage 3a: Secondary | ICD-10-CM | POA: Diagnosis present

## 2022-02-28 DIAGNOSIS — Z79899 Other long term (current) drug therapy: Secondary | ICD-10-CM

## 2022-02-28 DIAGNOSIS — N179 Acute kidney failure, unspecified: Secondary | ICD-10-CM | POA: Diagnosis present

## 2022-02-28 DIAGNOSIS — E860 Dehydration: Secondary | ICD-10-CM | POA: Diagnosis present

## 2022-02-28 DIAGNOSIS — E119 Type 2 diabetes mellitus without complications: Secondary | ICD-10-CM | POA: Diagnosis not present

## 2022-02-28 DIAGNOSIS — Z833 Family history of diabetes mellitus: Secondary | ICD-10-CM

## 2022-02-28 DIAGNOSIS — I129 Hypertensive chronic kidney disease with stage 1 through stage 4 chronic kidney disease, or unspecified chronic kidney disease: Secondary | ICD-10-CM | POA: Diagnosis present

## 2022-02-28 DIAGNOSIS — Z7982 Long term (current) use of aspirin: Secondary | ICD-10-CM

## 2022-02-28 DIAGNOSIS — I639 Cerebral infarction, unspecified: Secondary | ICD-10-CM | POA: Diagnosis present

## 2022-02-28 DIAGNOSIS — Z8673 Personal history of transient ischemic attack (TIA), and cerebral infarction without residual deficits: Secondary | ICD-10-CM

## 2022-02-28 DIAGNOSIS — D631 Anemia in chronic kidney disease: Secondary | ICD-10-CM | POA: Diagnosis present

## 2022-02-28 DIAGNOSIS — N400 Enlarged prostate without lower urinary tract symptoms: Secondary | ICD-10-CM | POA: Diagnosis present

## 2022-02-28 DIAGNOSIS — E1100 Type 2 diabetes mellitus with hyperosmolarity without nonketotic hyperglycemic-hyperosmolar coma (NKHHC): Principal | ICD-10-CM | POA: Diagnosis present

## 2022-02-28 DIAGNOSIS — Z8249 Family history of ischemic heart disease and other diseases of the circulatory system: Secondary | ICD-10-CM

## 2022-02-28 HISTORY — DX: Type 2 diabetes mellitus without complications: E11.9

## 2022-02-28 LAB — URINALYSIS, ROUTINE W REFLEX MICROSCOPIC
Bilirubin Urine: NEGATIVE
Glucose, UA: >=500 mg/dL — AB
Ketones, ur: 5 mg/dL — AB
Leukocytes,Ua: NEGATIVE
Nitrite: NEGATIVE
Protein, ur: NEGATIVE mg/dL
Specific Gravity, Urine: 1.023 (ref 1.005–1.030)
pH: 5 (ref 5.0–8.0)

## 2022-02-28 LAB — CBC WITH DIFFERENTIAL/PLATELET
Abs Immature Granulocytes: 0.07 10*3/uL (ref 0.00–0.07)
Basophils Absolute: 0 10*3/uL (ref 0.0–0.1)
Basophils Relative: 0 %
Eosinophils Absolute: 0.1 10*3/uL (ref 0.0–0.5)
Eosinophils Relative: 1 %
HCT: 39.7 % (ref 39.0–52.0)
Hemoglobin: 12.9 g/dL — ABNORMAL LOW (ref 13.0–17.0)
Immature Granulocytes: 1 %
Lymphocytes Relative: 20 %
Lymphs Abs: 2.3 10*3/uL (ref 0.7–4.0)
MCH: 28.5 pg (ref 26.0–34.0)
MCHC: 32.5 g/dL (ref 30.0–36.0)
MCV: 87.8 fL (ref 80.0–100.0)
Monocytes Absolute: 0.5 10*3/uL (ref 0.1–1.0)
Monocytes Relative: 5 %
Neutro Abs: 8.2 10*3/uL — ABNORMAL HIGH (ref 1.7–7.7)
Neutrophils Relative %: 73 %
Platelets: 192 10*3/uL (ref 150–400)
RBC: 4.52 MIL/uL (ref 4.22–5.81)
RDW: 13.5 % (ref 11.5–15.5)
WBC: 11.1 10*3/uL — ABNORMAL HIGH (ref 4.0–10.5)
nRBC: 0 % (ref 0.0–0.2)

## 2022-02-28 LAB — BLOOD GAS, VENOUS
Acid-Base Excess: 2.1 mmol/L — ABNORMAL HIGH (ref 0.0–2.0)
Bicarbonate: 28.7 mmol/L — ABNORMAL HIGH (ref 20.0–28.0)
O2 Saturation: 35.7 %
Patient temperature: 37
pCO2, Ven: 52 mmHg (ref 44–60)
pH, Ven: 7.35 (ref 7.25–7.43)
pO2, Ven: <31 mmHg — CL (ref 32–45)

## 2022-02-28 LAB — COMPREHENSIVE METABOLIC PANEL
ALT: 33 U/L (ref 0–44)
AST: 33 U/L (ref 15–41)
Albumin: 4.2 g/dL (ref 3.5–5.0)
Alkaline Phosphatase: 88 U/L (ref 38–126)
Anion gap: 15 (ref 5–15)
BUN: 18 mg/dL (ref 8–23)
CO2: 20 mmol/L — ABNORMAL LOW (ref 22–32)
Calcium: 9.4 mg/dL (ref 8.9–10.3)
Chloride: 97 mmol/L — ABNORMAL LOW (ref 98–111)
Creatinine, Ser: 1.79 mg/dL — ABNORMAL HIGH (ref 0.61–1.24)
GFR, Estimated: 42 mL/min — ABNORMAL LOW (ref >60)
Glucose, Bld: 592 mg/dL (ref 70–99)
Potassium: 3.5 mmol/L (ref 3.5–5.1)
Sodium: 132 mmol/L — ABNORMAL LOW (ref 135–145)
Total Bilirubin: 0.8 mg/dL (ref 0.3–1.2)
Total Protein: 7.8 g/dL (ref 6.5–8.1)

## 2022-02-28 LAB — HEMOGLOBIN A1C
Hgb A1c MFr Bld: 14.4 % — ABNORMAL HIGH (ref 4.8–5.6)
Mean Plasma Glucose: 366.58 mg/dL

## 2022-02-28 LAB — BETA-HYDROXYBUTYRIC ACID: Beta-Hydroxybutyric Acid: 1.11 mmol/L — ABNORMAL HIGH (ref 0.05–0.27)

## 2022-02-28 LAB — CBG MONITORING, ED
Glucose-Capillary: >600 mg/dL (ref 70–99)
Glucose-Capillary: 370 mg/dL — ABNORMAL HIGH (ref 70–99)

## 2022-02-28 LAB — LACTIC ACID, PLASMA: Lactic Acid, Venous: 1.8 mmol/L (ref 0.5–1.9)

## 2022-02-28 LAB — GLUCOSE, CAPILLARY
Glucose-Capillary: 484 mg/dL — ABNORMAL HIGH (ref 70–99)
Glucose-Capillary: 362 mg/dL — ABNORMAL HIGH (ref 70–99)
Glucose-Capillary: 280 mg/dL — ABNORMAL HIGH (ref 70–99)

## 2022-02-28 LAB — LIPASE, BLOOD: Lipase: 44 U/L (ref 11–51)

## 2022-02-28 MED ORDER — ROSUVASTATIN CALCIUM 20 MG PO TABS
20.0000 mg | ORAL_TABLET | Freq: Every day | ORAL | Status: DC
  Administered 2022-03-01 – 2022-03-02 (×2): 20 mg via ORAL
  Filled 2022-02-28 (×2): qty 1

## 2022-02-28 MED ORDER — ASPIRIN 81 MG PO TBEC
81.0000 mg | DELAYED_RELEASE_TABLET | Freq: Every day | ORAL | Status: DC
  Administered 2022-03-01 – 2022-03-02 (×2): 81 mg via ORAL
  Filled 2022-02-28 (×2): qty 1

## 2022-02-28 MED ORDER — INSULIN ASPART 100 UNIT/ML IJ SOLN
0.0000 [IU] | Freq: Three times a day (TID) | INTRAMUSCULAR | Status: DC
  Administered 2022-02-28: 25 [IU] via SUBCUTANEOUS
  Administered 2022-03-01: 5 [IU] via SUBCUTANEOUS
  Administered 2022-03-01: 8 [IU] via SUBCUTANEOUS
  Administered 2022-03-01: 11 [IU] via SUBCUTANEOUS
  Administered 2022-03-02: 8 [IU] via SUBCUTANEOUS
  Filled 2022-02-28: qty 0.15

## 2022-02-28 MED ORDER — DEXTROSE IN LACTATED RINGERS 5 % IV SOLN: INTRAVENOUS | Status: DC

## 2022-02-28 MED ORDER — ACETAMINOPHEN 325 MG PO TABS: 650.0000 mg | ORAL_TABLET | Freq: Four times a day (QID) | ORAL | Status: DC | PRN

## 2022-02-28 MED ORDER — INSULIN ASPART 100 UNIT/ML IJ SOLN
0.0000 [IU] | Freq: Every day | INTRAMUSCULAR | Status: DC
  Administered 2022-02-28 – 2022-03-01 (×2): 3 [IU] via SUBCUTANEOUS
  Filled 2022-02-28: qty 0.05

## 2022-02-28 MED ORDER — LACTATED RINGERS IV SOLN: INTRAVENOUS | Status: DC

## 2022-02-28 MED ORDER — HYDRALAZINE HCL 20 MG/ML IJ SOLN: 5.0000 mg | INTRAMUSCULAR | Status: DC | PRN

## 2022-02-28 MED ORDER — DEXTROSE 50 % IV SOLN: 0.0000 mL | INTRAVENOUS | Status: DC | PRN

## 2022-02-28 MED ORDER — INSULIN REGULAR(HUMAN) IN NACL 100-0.9 UT/100ML-% IV SOLN
INTRAVENOUS | Status: DC
  Administered 2022-02-28: 6 [IU]/h via INTRAVENOUS
  Filled 2022-02-28: qty 100

## 2022-02-28 MED ORDER — POLYETHYLENE GLYCOL 3350 17 G PO PACK: 17.0000 g | PACK | Freq: Every day | ORAL | Status: DC | PRN

## 2022-02-28 MED ORDER — SODIUM CHLORIDE 0.9 % IV BOLUS
1000.0000 mL | Freq: Once | INTRAVENOUS | Status: AC
  Administered 2022-02-28: 1000 mL via INTRAVENOUS

## 2022-02-28 MED ORDER — AMLODIPINE BESYLATE 10 MG PO TABS
10.0000 mg | ORAL_TABLET | Freq: Every day | ORAL | Status: DC
  Administered 2022-03-01 – 2022-03-02 (×2): 10 mg via ORAL
  Filled 2022-02-28 (×2): qty 1

## 2022-02-28 MED ORDER — TAMSULOSIN HCL 0.4 MG PO CAPS
0.4000 mg | ORAL_CAPSULE | Freq: Every day | ORAL | Status: DC
  Administered 2022-03-01 – 2022-03-02 (×2): 0.4 mg via ORAL
  Filled 2022-02-28 (×2): qty 1

## 2022-02-28 MED ORDER — LACTATED RINGERS IV BOLUS
2000.0000 mL | Freq: Once | INTRAVENOUS | Status: AC
  Administered 2022-02-28: 2000 mL via INTRAVENOUS

## 2022-02-28 MED ORDER — INSULIN GLARGINE-YFGN 100 UNIT/ML ~~LOC~~ SOLN
10.0000 [IU] | Freq: Once | SUBCUTANEOUS | Status: AC
  Administered 2022-02-28: 10 [IU] via SUBCUTANEOUS
  Filled 2022-02-28: qty 0.1

## 2022-02-28 MED ORDER — ONDANSETRON HCL 4 MG PO TABS: 4.0000 mg | ORAL_TABLET | Freq: Four times a day (QID) | ORAL | Status: DC | PRN

## 2022-02-28 MED ORDER — ONDANSETRON HCL 4 MG/2ML IJ SOLN: 4.0000 mg | Freq: Four times a day (QID) | INTRAMUSCULAR | Status: DC | PRN

## 2022-02-28 MED ORDER — ENOXAPARIN SODIUM 40 MG/0.4ML IJ SOSY
40.0000 mg | PREFILLED_SYRINGE | INTRAMUSCULAR | Status: DC
  Administered 2022-02-28 – 2022-03-01 (×2): 40 mg via SUBCUTANEOUS
  Filled 2022-02-28 (×2): qty 0.4

## 2022-02-28 MED ORDER — ACETAMINOPHEN 650 MG RE SUPP: 650.0000 mg | Freq: Four times a day (QID) | RECTAL | Status: DC | PRN

## 2022-02-28 MED ORDER — DOCUSATE SODIUM 100 MG PO CAPS
100.0000 mg | ORAL_CAPSULE | Freq: Two times a day (BID) | ORAL | Status: DC
  Administered 2022-02-28 – 2022-03-02 (×4): 100 mg via ORAL
  Filled 2022-02-28 (×4): qty 1

## 2022-02-28 MED ORDER — BISACODYL 5 MG PO TBEC
5.0000 mg | DELAYED_RELEASE_TABLET | Freq: Every day | ORAL | Status: DC | PRN
  Administered 2022-03-01: 5 mg via ORAL
  Filled 2022-02-28: qty 1

## 2022-02-28 NOTE — ED Notes (Signed)
Bloodwork, vbg and urine sent to lab

## 2022-02-28 NOTE — ED Triage Notes (Signed)
Pt reports frequent urination and thirst x 1 month. Glucose read high at home. Not a diagnosed diabetic

## 2022-02-28 NOTE — Telephone Encounter (Signed)
  Chief Complaint: Hyperglycemic Symptoms: Dizziness and weakness - unknown Blood sugar level Frequency: now Pertinent Negatives: Patient denies  Disposition: '[x]'$ ED /'[]'$ Urgent Care (no appt availability in office) / '[]'$ Appointment(In office/virtual)/ '[]'$  [place] [facility]/ '[]'$ Home Care/ '[]'$ Refused Recommended Disposition /'[]'$ [facility] [facility]/ '[]'$  Follow-up with PCP Additional Notes: Pt states that his sister came over and checked his blood sugar using her machine. Pt's reading was too high to register on machine. Pt states he feels dizzy and weak. Pt will go to ED for care.    Reason for Disposition  [1] New-onset diabetes suspected (e.g., frequent urination, weak, weight loss) AND [2] vomiting or rapid breathing  Answer Assessment - Initial Assessment Questions 1. BLOOD GLUCOSE: "What is your blood glucose level?"      unsure 2. ONSET: "When did you check the blood glucose?"     8:45 am 3. USUAL RANGE: "What is your glucose level usually?" (e.g., usual fasting morning value, usual evening value)     Never checked them before 4. KETONES: "Do you check for ketones (urine or blood test strips)?" If Yes, ask: "What does the test show now?"       5. TYPE 1 or 2:  "Do you know what type of diabetes you have?"  (e.g., Type 1, Type 2, Gestational; doesn't know)       6. INSULIN: "Do you take insulin?" "What type of insulin(s) do you use? What is the mode of delivery? (syringe, pen; injection or pump)?"      nothing 7. DIABETES PILLS: "Do you take any pills for your diabetes?" If Yes, ask: "Have you missed taking any pills recently?"     nothing 8. OTHER SYMPTOMS: "Do you have any symptoms?" (e.g., fever, frequent urination, difficulty breathing, dizziness, weakness, vomiting)     Tired and dizzy 9. PREGNANCY: "Is there any chance you are pregnant?" "When was your last menstrual period?"  Protocols used: Diabetes - High Blood Sugar-A-AH

## 2022-02-28 NOTE — H&P (Addendum)
History and Physical    Patient: Curtis Saunders XNA:[id number] DOB: [birth year] DOA: 02/28/2022 DOS: the patient was seen and examined on 02/28/2022 PCP: Camillia Herter, NP  Patient coming from: Home - lives with girlfriend; NOK: Girlfriend, [phone]    Chief Complaint: High sugar  HPI: Curtis Saunders is a 67 y.o. male with medical history significant of HTN and CVA presenting with hyperglycemia. He reports that he was feeling dizzy and his sister checked his sugar and it was "high."  He has been having polyuria and polydipsia for about 3 weeks.  His sugar has ben high for this time.  It has been running high but today was super high.  +FH - father, sister.  No blurry vision.  No foot lesions.    ER Course:  Glucose >600, new diabetic.  Recent fatigue, polyuria, polydipsia.  Not in DKA at this time.  +AKI.       Review of Systems: As mentioned in the history of present illness. All other systems reviewed and are negative. Past Medical History:  Diagnosis Date   Diabetes mellitus ([place])    Hypertension    Stroke ([place]) 2023   x 2, no deficits   Past Surgical History:  Procedure Laterality Date   APPENDECTOMY     EYE SURGERY     HAND SURGERY     KNEE SURGERY     x3, x1 R   LIPOMA EXCISION Left 10/13/2019   Procedure: SHOULDER EXCISION LIPOMA AND ROTATOR CUFF REPAIR;  Surgeon: Tania Ade, MD;  Location: [place];  Service: Orthopedics;  Laterality: Left;   TONSILLECTOMY     Social History:  reports that he has never smoked. He has never been exposed to tobacco smoke. He has never used smokeless tobacco. He reports current alcohol use. He reports that he does not use drugs.  Allergies  Allergen Reactions   Bee Venom Anaphylaxis    Family History  Problem Relation Age of Onset   Heart attack Mother 69   COPD Mother    Emphysema Mother    Diabetes Father    Diabetes Sister    Hypertension Sister    Sleep apnea Neg Hx     Prior to  Admission medications   Medication Sig Start Date End Date Taking? Authorizing Provider  amLODipine (NORVASC) 10 MG tablet Take 1 tablet by mouth once daily 09/12/21  Yes Minus Breeding, MD  aspirin EC 81 MG EC tablet Take 1 tablet (81 mg total) by mouth daily. Swallow whole. 02/28/20  Yes Barb Merino, MD  furosemide (LASIX) 20 MG tablet Take 20 mg by mouth daily. 02/07/22  Yes [provider]  ramipril (ALTACE) 10 MG capsule Take 1 capsule (10 mg total) by mouth daily. 02/27/22  Yes Leonie Man, MD  rosuvastatin (CRESTOR) 20 MG tablet Take 1 tablet by mouth once daily 02/27/22  Yes McCue, [name], NP  tamsulosin (FLOMAX) 0.4 MG CAPS capsule Take 0.4 mg by mouth daily.   Yes [provider]  chlorthalidone (HYGROTON) 25 MG tablet Take 1 tablet by mouth once daily Patient not taking: Reported on 02/23/2022 09/12/21   Minus Breeding, MD    Physical Exam: Vitals:   02/28/22 1020 02/28/22 1100 02/28/22 1115 02/28/22 1332  BP: (!) 145/103 (!) 136/99  (!) 173/106  Pulse: 87  73 68  Resp: 18   16  Temp: 97.7 F (36.5 C)   97.8 F (36.6 C)  TempSrc: Oral   Oral  SpO2:  100%  100% 100%  Weight:      Height:       General:  Appears calm and comfortable and is in NAD Eyes:  PERRL, EOMI, normal lids, iris ENT:  grossly normal hearing, very dry lips & tongue, moderately dry mm; suboptimal dentition Neck:  no LAD, masses or thyromegaly Cardiovascular:  RRR, no m/r/g. No LE edema.  Respiratory:   CTA bilaterally with no wheezes/rales/rhonchi.  Normal respiratory effort. Abdomen:  soft, NT, ND Skin:  no rash or induration seen on limited exam Musculoskeletal:  grossly normal tone BUE/BLE, good ROM, no bony abnormality Psychiatric:  grossly normal mood and affect, speech fluent and appropriate, AOx3 Neurologic:  CN 2-12 grossly intact, moves all extremities in coordinated fashion   Radiological Exams on Admission: Independently reviewed - see discussion in A/P where  applicable  No results found.  EKG: not done   Labs on Admission: I have personally reviewed the available labs and imaging studies at the time of the admission.  Pertinent labs:    VBG: 7.35/52/28.7 Na+++ 132 CO2 20 Glucose 592 BUN 18/Creatinine 1.79/GFR 42; 28/1.58/49 on 2/1 Anion gap 15 Lactate 1.8 WBC 11.1 UA: >500 glucose, small Hgb, 5 ketones A1c 8.0 on 09/07/21, 7.3 on 06/23/21   Assessment and Plan: Principal Problem:   New onset type 2 diabetes mellitus ([name]) Active Problems:   Essential hypertension   CVA (cerebral vascular accident) ([place])   Stage 3 chronic kidney disease (HCC)   BPH (benign prostatic hyperplasia)    Hyperglycemia from DM, no DKA -Patient presenting with hyperglycemia after checking glucose at home -Previously established care with PCP on 09/07/21 and reported that he was diagnosed with DM at [place] but never took medications and "outgrew diabetes; his A1c was 8 at that time -He was started on empagliflozin but this was discontinued when the patient reported hematuria as a side effect (although it was noted that this is not an expected side effect) -He does not appear to have been started on another medication at that time but was referred to endocrinology - although he does not appear to have gone -He is not in DKA today but does have marked hyperglycemia -He was started on Endotool in the ER but does not appear to need this currently - no acidosis, no anion gap -Instead, will give 2L bolus of IVF and then 100 cc/hr for now -10 units glargine for now with moderate-scale SSI -DM coordinator consultation -Nutrition consult for diabetes education -Recheck BMP in AM  AKI on stage 3a CKD -Uncertain baseline, as his renal function was even worse prior -Most recently, creatinine was 1.58 and it is current 1.79 -Likely due to prerenal failure secondary to dehydration and continuation of ACEI, diruetics -Hold diuretics for now -IVF as above -Follow up  renal function by BMP -Avoid ACEI and NSAIDs   HTN -Continue amlodipine -Hold ramipril -Will cover with prn IV hydralazine  BPH -Continue tamsulosin  H/o CVA -Continue ASA, rosuvstatin    Advance Care Planning:   Code Status: Full Code - Code status was discussed with the patient and/or family at the time of admission.  The patient would want to receive full resuscitative measures at this time.   Consults: DM coordinator; nutrition  DVT Prophylaxis: Lovenox  Family Communication: None present; he is capable of communicating with family at this time  Severity of Illness: It is my clinical opinion that referral for OBSERVATION is reasonable and necessary in this patient based on the above information  provided. The aforementioned taken together are felt to place the patient at high risk for further clinical deterioration. However it is anticipated that the patient may be medically stable for discharge from the hospital within 24 to 48 hours.   Author: Karmen Bongo, MD 02/28/2022 3:25 PM  For on call review [email or web address].

## 2022-02-28 NOTE — Progress Notes (Signed)
Patient had CBG of 484, MD notified. Order to give the max dose of sliding scale dosage of 15 units and 10 additional doses. Total of 25 units given. CBG rechecked and currently 364. MD notified. No additional orders.

## 2022-02-28 NOTE — Telephone Encounter (Signed)
PCP aware

## 2022-02-28 NOTE — ED Notes (Signed)
Ambulate to bathroom ual. Educated on need for urine sample

## 2022-02-28 NOTE — ED Notes (Signed)
ED TO INPATIENT HANDOFF REPORT  Name/Age/Gender Curtis Saunders 67 y.o. male  Code Status    Code Status Orders  (From admission, onward)           Start     Ordered   02/28/22 1430  Full code  Continuous       Question:  By:  Answer:  Consent: discussion documented in EHR   02/28/22 1430           Code Status History     Date Active Date Inactive Code Status Order ID Comments User Context   02/27/2020 1812 02/29/2020 0202 Partial Code [id number]  Jonnie Finner, DO Inpatient   02/21/2018 0223 02/21/2018 1246 Full Code [id number]  Elmarie Shiley, MD ED       Home/SNF/Other Home  Chief Complaint New onset type 2 diabetes mellitus ([place]) [E11.9]  Level of Care/Admitting Diagnosis ED Disposition     ED Disposition  Admit   Condition  --   Comment  Hospital Area: [facility] [[id number]]  Level of Care: Med-Surg [16]  May place patient in observation at [facility] or [place] if equivalent level of care is available:: Yes  Covid Evaluation: Asymptomatic - no recent exposure (last 10 days) testing not required  Diagnosis: New onset type 2 diabetes mellitus [facility]) [[id number]]  Admitting Physician: Karmen Bongo [[id number]]  Attending Physician: Karmen Bongo [[id number]]          Medical History Past Medical History:  Diagnosis Date   Diabetes mellitus ([place])    Hypertension    Stroke ([place]) 2023   x 2, no deficits    Allergies Allergies  Allergen Reactions   Bee Venom Anaphylaxis    IV Location/Drains/Wounds Patient Lines/Drains/Airways Status     Active Line/Drains/Airways     Name Placement date Placement time Site Days   Peripheral IV 02/28/22 Anterior;Left Antecubital 02/28/22  1045  Antecubital  less than 1            Labs/Imaging Results for orders placed or performed during the hospital encounter of 02/28/22 (from the past 48 hour(s))  CBG monitoring, ED     Status: Abnormal   Collection Time: 02/28/22 10:09 AM   Result Value Ref Range   Glucose-Capillary >600 (HH) 70 - 99 mg/dL    Comment: Glucose reference range applies only to samples taken after fasting for at least 8 hours.   Comment 1 Notify RN   Urinalysis, Routine w reflex microscopic -Urine, Clean Catch     Status: Abnormal   Collection Time: 02/28/22 10:45 AM  Result Value Ref Range   Color, Urine COLORLESS (A) YELLOW   APPearance CLEAR CLEAR   Specific Gravity, Urine 1.023 1.005 - 1.030   pH 5.0 5.0 - 8.0   Glucose, UA >=500 (A) NEGATIVE mg/dL   Hgb urine dipstick SMALL (A) NEGATIVE   Bilirubin Urine NEGATIVE NEGATIVE   Ketones, ur 5 (A) NEGATIVE mg/dL   Protein, ur NEGATIVE NEGATIVE mg/dL   Nitrite NEGATIVE NEGATIVE   Leukocytes,Ua NEGATIVE NEGATIVE   RBC / HPF 0-5 0 - 5 RBC/hpf   WBC, UA 0-5 0 - 5 WBC/hpf   Bacteria, UA RARE (A) NONE SEEN   Squamous Epithelial / HPF 0-5 0 - 5 /HPF   Mucus PRESENT     Comment: Performed at [facility], [place] [address], [place], [place] [address]  Blood gas, venous (at [facility] and [facility])     Status: Abnormal   Collection  Time: 02/28/22 10:46 AM  Result Value Ref Range   pH, Ven 7.35 7.25 - 7.43   pCO2, Ven 52 44 - 60 mmHg   pO2, Ven <31 (LL) 32 - 45 mmHg    Comment: CRITICAL RESULT CALLED TO, READ BACK BY AND VERIFIED WITH: Tambi Thole, I. RN AT 1112 ON 02/28/2022 BY MECIAL J.    Bicarbonate 28.7 (H) 20.0 - 28.0 mmol/L   Acid-Base Excess 2.1 (H) 0.0 - 2.0 mmol/L   O2 Saturation 35.7 %   Patient temperature 37.0     Comment: Performed at [facility], [place] [address], [place], [place] [address]  CBC with Differential     Status: Abnormal   Collection Time: 02/28/22 10:55 AM  Result Value Ref Range   WBC 11.1 (H) 4.0 - 10.5 K/uL   RBC 4.52 4.22 - 5.81 MIL/uL   Hemoglobin 12.9 (L) 13.0 - 17.0 g/dL   HCT 39.7 39.0 - 52.0 %   MCV 87.8 80.0 - 100.0 fL   MCH 28.5 26.0 - 34.0 pg   MCHC 32.5 30.0 - 36.0 g/dL   RDW 13.5 11.5 - 15.5 %   Platelets 192 150 - 400 K/uL    nRBC 0.0 0.0 - 0.2 %   Neutrophils Relative % 73 %   Neutro Abs 8.2 (H) 1.7 - 7.7 K/uL   Lymphocytes Relative 20 %   Lymphs Abs 2.3 0.7 - 4.0 K/uL   Monocytes Relative 5 %   Monocytes Absolute 0.5 0.1 - 1.0 K/uL   Eosinophils Relative 1 %   Eosinophils Absolute 0.1 0.0 - 0.5 K/uL   Basophils Relative 0 %   Basophils Absolute 0.0 0.0 - 0.1 K/uL   Immature Granulocytes 1 %   Abs Immature Granulocytes 0.07 0.00 - 0.07 K/uL    Comment: Performed at [facility], [place] [address], [place], [place] [address]  Comprehensive metabolic panel     Status: Abnormal   Collection Time: 02/28/22 10:55 AM  Result Value Ref Range   Sodium 132 (L) 135 - 145 mmol/L   Potassium 3.5 3.5 - 5.1 mmol/L   Chloride 97 (L) 98 - 111 mmol/L   CO2 20 (L) 22 - 32 mmol/L   Glucose, Bld 592 (HH) 70 - 99 mg/dL    Comment: CRITICAL RESULT CALLED TO, READ BACK BY AND VERIFIED WITH CORTES,Y. RN AT 1155 02/28/22 MULLINS,T Glucose reference range applies only to samples taken after fasting for at least 8 hours.    BUN 18 8 - 23 mg/dL   Creatinine, Ser 1.79 (H) 0.61 - 1.24 mg/dL   Calcium 9.4 8.9 - 10.3 mg/dL   Total Protein 7.8 6.5 - 8.1 g/dL   Albumin 4.2 3.5 - 5.0 g/dL   AST 33 15 - 41 U/L   ALT 33 0 - 44 U/L   Alkaline Phosphatase 88 38 - 126 U/L   Total Bilirubin 0.8 0.3 - 1.2 mg/dL   GFR, Estimated 42 (L) >60 mL/min    Comment: (NOTE) Calculated using the CKD-EPI Creatinine Equation (2021)    Anion gap 15 5 - 15    Comment: Performed at [facility], [place] [address] [place], [place] [address]  Beta-hydroxybutyric acid     Status: Abnormal   Collection Time: 02/28/22 10:55 AM  Result Value Ref Range   Beta-Hydroxybutyric Acid 1.11 (H) 0.05 - 0.27 mmol/L    Comment: Performed at [facility], [place] [address] [place], [place] [address]  Lactic  acid, plasma     Status: None   Collection Time: 02/28/22 10:55 AM  Result Value Ref Range   Lactic Acid,  Venous 1.8 0.5 - 1.9 mmol/L    Comment: Performed at [facility], [place] [address] [place], [name] [address]  Lipase, blood     Status: None   Collection Time: 02/28/22 10:55 AM  Result Value Ref Range   Lipase 44 11 - 51 U/L    Comment: Performed at [facility], [place] [address] [place], [place] [address]  CBG monitoring, ED     Status: Abnormal   Collection Time: 02/28/22  2:16 PM  Result Value Ref Range   Glucose-Capillary 370 (H) 70 - 99 mg/dL    Comment: Glucose reference range applies only to samples taken after fasting for at least 8 hours.   No results found.  Pending Labs Unresulted Labs (From admission, onward)     Start     Ordered   03/01/22 7371  Basic metabolic panel  Tomorrow morning,   R        02/28/22 1430   03/01/22 0500  CBC  Tomorrow morning,   R        02/28/22 1430   02/28/22 1429  HIV Antibody (routine testing w rflx)  (HIV Antibody (Routine testing w reflex) panel)  Once,   R        02/28/22 1430   02/28/22 1429  Hemoglobin A1c  (Glycemic Control (SSI)  Q 4 Hours / Glycemic Control (SSI)  AC +/- HS)  Once,   R       Comments: To assess prior glycemic control    02/28/22 1430   02/28/22 1046  Urine Culture (for pregnant, neutropenic or urologic patients or patients with an indwelling urinary catheter)  (Urine Labs)  Once,   URGENT       Question:  Indication  Answer:  Urgency/frequency   02/28/22 1046            Vitals/Pain Today's Vitals   02/28/22 1020 02/28/22 1100 02/28/22 1115 02/28/22 1332  BP: (!) 145/103 (!) 136/99  (!) 173/106  Pulse: 87  73 68  Resp: 18   16  Temp: 97.7 F (36.5 C)   97.8 F (36.6 C)  TempSrc: Oral   Oral  SpO2: 100%  100% 100%  Weight:      Height:      PainSc:        Isolation Precautions No active isolations  Medications Medications  aspirin EC tablet 81 mg (has no administration in time range)  amLODipine (NORVASC) tablet 10 mg (has no administration in time  range)  rosuvastatin (CRESTOR) tablet 20 mg (has no administration in time range)  tamsulosin (FLOMAX) capsule 0.4 mg (has no administration in time range)  insulin aspart (novoLOG) injection 0-15 Units (has no administration in time range)  insulin aspart (novoLOG) injection 0-5 Units (has no administration in time range)  enoxaparin (LOVENOX) injection 40 mg (has no administration in time range)  lactated ringers infusion (has no administration in time range)  acetaminophen (TYLENOL) tablet 650 mg (has no administration in time range)    Or  acetaminophen (TYLENOL) suppository 650 mg (has no administration in time range)  docusate sodium (COLACE) capsule 100 mg (has no administration in time range)  polyethylene glycol (MIRALAX / GLYCOLAX) packet 17 g (has no administration in time range)  bisacodyl (DULCOLAX) EC tablet 5 mg (has no administration in time range)  ondansetron (ZOFRAN) tablet 4 mg (has no administration in time range)    Or  ondansetron (ZOFRAN) injection 4 mg (has no administration in time range)  hydrALAZINE (APRESOLINE) injection 5 mg (has no administration in time range)  sodium chloride 0.9 % bolus 1,000 mL (0 mLs Intravenous Stopped 02/28/22 1223)  insulin glargine-yfgn (SEMGLEE) injection 10 Units (10 Units Subcutaneous Given 02/28/22 1450)  lactated ringers bolus 2,000 mL (2,000 mLs Intravenous New Bag/Given 02/28/22 1452)    Mobility walks

## 2022-02-28 NOTE — ED Provider Notes (Signed)
[place] [facility] Provider Note   CSN: [id number] Arrival date & time: 02/28/22  1003     History  Chief Complaint  Patient presents with   Hyperglycemia    Curtis Saunders is a 67 y.o. male.  The history is provided by the patient and medical records. No language interpreter was used.  Hyperglycemia Blood sugar level PTA:  >600 Severity:  Severe Onset quality:  Gradual Duration:  1 month Timing:  Constant Progression:  Worsening Chronicity:  New Diabetes status:  Non-diabetic Context: new diabetes diagnosis   Relieved by:  Nothing Ineffective treatments:  None tried Associated symptoms: dehydration, fatigue, increased thirst and polyuria   Associated symptoms: no abdominal pain, no altered mental status, no blurred vision, no chest pain, no confusion, no diaphoresis, no dizziness, no dysuria, no fever, no malaise, no nausea, no shortness of breath and no vomiting   Risk factors: no hx of DKA        Home Medications Prior to Admission medications   Medication Sig Start Date End Date Taking? Authorizing Provider  amLODipine (NORVASC) 10 MG tablet Take 1 tablet by mouth once daily 09/12/21   Minus Breeding, MD  aspirin EC 81 MG EC tablet Take 1 tablet (81 mg total) by mouth daily. Swallow whole. 02/28/20   Barb Merino, MD  chlorthalidone (HYGROTON) 25 MG tablet Take 1 tablet by mouth once daily Patient not taking: Reported on 02/23/2022 09/12/21   Minus Breeding, MD  furosemide (LASIX) 20 MG tablet Take 20 mg by mouth daily. 02/07/22   [provider]  ramipril (ALTACE) 10 MG capsule Take 1 capsule (10 mg total) by mouth daily. 02/27/22   Leonie Man, MD  rosuvastatin (CRESTOR) 20 MG tablet Take 1 tablet by mouth once daily 02/27/22   Frann Rider, NP  tamsulosin (FLOMAX) 0.4 MG CAPS capsule Take 1 capsule by mouth daily.    [provider]      Allergies    Bee venom    Review of Systems   Review of  Systems  Constitutional:  Positive for fatigue. Negative for chills, diaphoresis and fever.  HENT:  Negative for congestion.   Eyes:  Negative for blurred vision and visual disturbance.  Respiratory:  Negative for cough, chest tightness, shortness of breath and wheezing.   Cardiovascular:  Negative for chest pain, palpitations and leg swelling.  Gastrointestinal:  Negative for abdominal pain, constipation, diarrhea, nausea and vomiting.  Endocrine: Positive for polydipsia and polyuria.  Genitourinary:  Positive for frequency. Negative for decreased urine volume, dysuria, flank pain, scrotal swelling and testicular pain.  Musculoskeletal:  Negative for back pain, neck pain and neck stiffness.  Skin:  Negative for rash and wound.  Neurological:  Negative for dizziness and headaches.  Psychiatric/Behavioral:  Negative for agitation and confusion.   All other systems reviewed and are negative.   Physical Exam Updated Vital Signs BP (!) 160/103 (BP Location: Left Arm)   Pulse 98   Temp 97.6 F (36.4 C) (Oral)   Ht '5\' 11"'$  (1.803 m)   Wt 96 kg   SpO2 100%   BMI 29.52 kg/m  Physical Exam Vitals and nursing note reviewed.  Constitutional:      General: He is not in acute distress.    Appearance: He is well-developed. He is not ill-appearing, toxic-appearing or diaphoretic.  HENT:     Head: Normocephalic and atraumatic.     Mouth/Throat:     Mouth: Mucous membranes are  dry.     Pharynx: No oropharyngeal exudate or posterior oropharyngeal erythema.  Eyes:     Extraocular Movements: Extraocular movements intact.     Conjunctiva/sclera: Conjunctivae normal.     Pupils: Pupils are equal, round, and reactive to light.  Cardiovascular:     Rate and Rhythm: Normal rate and regular rhythm.     Heart sounds: No murmur heard. Pulmonary:     Effort: Pulmonary effort is normal. No respiratory distress.     Breath sounds: Normal breath sounds. No wheezing, rhonchi or rales.  Chest:     Chest  wall: No tenderness.  Abdominal:     Palpations: Abdomen is soft.     Tenderness: There is no abdominal tenderness. There is no right CVA tenderness, left CVA tenderness, guarding or rebound.  Musculoskeletal:        General: No swelling, tenderness or signs of injury.     Cervical back: Neck supple. No tenderness.     Right lower leg: No edema.     Left lower leg: No edema.  Skin:    General: Skin is warm and dry.     Capillary Refill: Capillary refill takes less than 2 seconds.     Findings: No erythema or rash.  Neurological:     General: No focal deficit present.     Mental Status: He is alert.     Sensory: No sensory deficit.     Motor: No weakness.  Psychiatric:        Mood and Affect: Mood normal.     ED Results / Procedures / Treatments   Labs (all labs ordered are listed, but only abnormal results are displayed) Labs Reviewed  CBC WITH DIFFERENTIAL/PLATELET - Abnormal; Notable for the following components:      Result Value   WBC 11.1 (*)    Hemoglobin 12.9 (*)    Neutro Abs 8.2 (*)    All other components within normal limits  COMPREHENSIVE METABOLIC PANEL - Abnormal; Notable for the following components:   Sodium 132 (*)    Chloride 97 (*)    CO2 20 (*)    Glucose, Bld 592 (*)    Creatinine, Ser 1.79 (*)    GFR, Estimated 42 (*)    All other components within normal limits  URINALYSIS, ROUTINE W REFLEX MICROSCOPIC - Abnormal; Notable for the following components:   Color, Urine COLORLESS (*)    Glucose, UA >=500 (*)    Hgb urine dipstick SMALL (*)    Ketones, ur 5 (*)    Bacteria, UA RARE (*)    All other components within normal limits  BETA-HYDROXYBUTYRIC ACID - Abnormal; Notable for the following components:   Beta-Hydroxybutyric Acid 1.11 (*)    All other components within normal limits  BLOOD GAS, VENOUS - Abnormal; Notable for the following components:   pO2, Ven <31 (*)    Bicarbonate 28.7 (*)    Acid-Base Excess 2.1 (*)    All other components  within normal limits  CBG MONITORING, ED - Abnormal; Notable for the following components:   Glucose-Capillary >600 (*)    All other components within normal limits  CBG MONITORING, ED - Abnormal; Notable for the following components:   Glucose-Capillary 370 (*)    All other components within normal limits  URINE CULTURE  LACTIC ACID, PLASMA  LIPASE, BLOOD  HIV ANTIBODY (ROUTINE TESTING W REFLEX)  HEMOGLOBIN A1C    EKG None  Radiology No results found.  Procedures Procedures  CRITICAL CARE Performed by: Gwenyth Allegra Nolawi Kanady Total critical care time: 35 minutes Critical care time was exclusive of separately billable procedures and treating other patients. Critical care was necessary to treat or prevent imminent or life-threatening deterioration. Critical care was time spent personally by me on the following activities: development of treatment plan with patient and/or surrogate as well as nursing, discussions with consultants, evaluation of patient's response to treatment, examination of patient, obtaining history from patient or surrogate, ordering and performing treatments and interventions, ordering and review of laboratory studies, ordering and review of radiographic studies, pulse oximetry and re-evaluation of patient's condition.   Medications Ordered in ED Medications  insulin regular, human (MYXREDLIN) 100 units/ 100 mL infusion (has no administration in time range)  lactated ringers infusion (has no administration in time range)  dextrose 5 % in lactated ringers infusion (has no administration in time range)  dextrose 50 % solution 0-50 mL (has no administration in time range)  sodium chloride 0.9 % bolus 1,000 mL (0 mLs Intravenous Stopped 02/28/22 1223)    ED Course/ Medical Decision Making/ A&P                             Medical Decision Making Amount and/or Complexity of Data Reviewed Labs: ordered.  Risk Prescription drug management. Decision  regarding hospitalization.    Curtis Saunders is a 67 y.o. male With with a past medical history significant for CKD, hypertension, ascending aortic aneurysm, LVH, and previous stroke who presents with polyuria, polydipsia, and fatigue with elevated glucose.  According to patient and family, he was told he was borderline diabetic previously but has never had diabetes.  He has never been on glucose medications.  He for the last few weeks has had more fatigue and feeling dehydrated with thirst and polyuria.  Denies dysuria.  Denies any abdominal pain, chest pain, nausea, vomiting, constipation, or diarrhea.  Denies any trauma.  Reports that family thought he was acting more fatigued and took a glucose and it was read as high.  Patient presents for evaluation.  In triage, glucose found to be greater than 600.   On exam, lungs clear and chest nontender.  Abdomen nontender.  No focal neurologic deficits.  Pupils are symmetric and reactive with normal extract movements.  Speech is clear.  No focal deficits seen.  Patient does have very dry mucous membranes and does not appear fluid overloaded.  Clinically I am concerned with new onset diabetes and possible DKA.  Will get screening labs and start him with some fluids.  Anticipate admission for hyperglycemic management after workup is completed.              1:17 PM Workup continues to return.  It appears patient is nearing DKA but not fully there at this time.  He does have an elevated beta hydroxy uric acid and his glucose is almost 600 now.  Creatinine is elevated at 1.79 from several days ago.  His white count is 11.1 and has very mild anemia.  Lipase not elevated.  Lactic acid normal.  Urinalysis does not show convincing evidence of UTI and he does have some ketones.  His blood gas shows a pH of 7.35 so he is not profoundly acidotic however I am concerned with his new upper glycemia and likely new diabetes and glucose going from 300 recently up to  now 600, I do feel he needs to be admitted for diabetic management  and to fend off impending DKA.   Will call medicine for admission.         Final Clinical Impression(s) / ED Diagnoses Final diagnoses:  Hyperglycemia     Clinical Impression: 1. Hyperglycemia     Disposition: Admit  This note was prepared with assistance of Dragon voice recognition software. Occasional wrong-word or sound-a-like substitutions may have occurred due to the inherent limitations of voice recognition software.     Antonin Meininger, Gwenyth Allegra, MD 02/28/22 1444

## 2022-03-01 DIAGNOSIS — Z8673 Personal history of transient ischemic attack (TIA), and cerebral infarction without residual deficits: Secondary | ICD-10-CM | POA: Diagnosis not present

## 2022-03-01 DIAGNOSIS — Z825 Family history of asthma and other chronic lower respiratory diseases: Secondary | ICD-10-CM | POA: Diagnosis not present

## 2022-03-01 DIAGNOSIS — D631 Anemia in chronic kidney disease: Secondary | ICD-10-CM | POA: Diagnosis present

## 2022-03-01 DIAGNOSIS — I129 Hypertensive chronic kidney disease with stage 1 through stage 4 chronic kidney disease, or unspecified chronic kidney disease: Secondary | ICD-10-CM | POA: Diagnosis present

## 2022-03-01 DIAGNOSIS — N400 Enlarged prostate without lower urinary tract symptoms: Secondary | ICD-10-CM | POA: Diagnosis not present

## 2022-03-01 DIAGNOSIS — E1100 Type 2 diabetes mellitus with hyperosmolarity without nonketotic hyperglycemic-hyperosmolar coma (NKHHC): Secondary | ICD-10-CM

## 2022-03-01 DIAGNOSIS — I63312 Cerebral infarction due to thrombosis of left middle cerebral artery: Secondary | ICD-10-CM | POA: Diagnosis not present

## 2022-03-01 DIAGNOSIS — Z8249 Family history of ischemic heart disease and other diseases of the circulatory system: Secondary | ICD-10-CM | POA: Diagnosis not present

## 2022-03-01 DIAGNOSIS — R739 Hyperglycemia, unspecified: Secondary | ICD-10-CM | POA: Diagnosis not present

## 2022-03-01 DIAGNOSIS — Z79899 Other long term (current) drug therapy: Secondary | ICD-10-CM | POA: Diagnosis not present

## 2022-03-01 DIAGNOSIS — Z833 Family history of diabetes mellitus: Secondary | ICD-10-CM | POA: Diagnosis not present

## 2022-03-01 DIAGNOSIS — N179 Acute kidney failure, unspecified: Secondary | ICD-10-CM | POA: Diagnosis present

## 2022-03-01 DIAGNOSIS — E119 Type 2 diabetes mellitus without complications: Secondary | ICD-10-CM | POA: Diagnosis not present

## 2022-03-01 DIAGNOSIS — N189 Chronic kidney disease, unspecified: Secondary | ICD-10-CM | POA: Diagnosis present

## 2022-03-01 DIAGNOSIS — E1122 Type 2 diabetes mellitus with diabetic chronic kidney disease: Secondary | ICD-10-CM | POA: Diagnosis present

## 2022-03-01 DIAGNOSIS — E860 Dehydration: Secondary | ICD-10-CM | POA: Diagnosis present

## 2022-03-01 DIAGNOSIS — Z7982 Long term (current) use of aspirin: Secondary | ICD-10-CM | POA: Diagnosis not present

## 2022-03-01 DIAGNOSIS — N1831 Chronic kidney disease, stage 3a: Secondary | ICD-10-CM | POA: Diagnosis present

## 2022-03-01 LAB — CBC
HCT: 34.2 % — ABNORMAL LOW (ref 39.0–52.0)
Hemoglobin: 11.1 g/dL — ABNORMAL LOW (ref 13.0–17.0)
MCH: 29 pg (ref 26.0–34.0)
MCHC: 32.5 g/dL (ref 30.0–36.0)
MCV: 89.3 fL (ref 80.0–100.0)
Platelets: 160 10*3/uL (ref 150–400)
RBC: 3.83 MIL/uL — ABNORMAL LOW (ref 4.22–5.81)
RDW: 14 % (ref 11.5–15.5)
WBC: 11.6 10*3/uL — ABNORMAL HIGH (ref 4.0–10.5)
nRBC: 0 % (ref 0.0–0.2)

## 2022-03-01 LAB — BASIC METABOLIC PANEL
Anion gap: 10 (ref 5–15)
BUN: 15 mg/dL (ref 8–23)
CO2: 23 mmol/L (ref 22–32)
Calcium: 9.1 mg/dL (ref 8.9–10.3)
Chloride: 106 mmol/L (ref 98–111)
Creatinine, Ser: 1.47 mg/dL — ABNORMAL HIGH (ref 0.61–1.24)
GFR, Estimated: 53 mL/min — ABNORMAL LOW (ref >60)
Glucose, Bld: 224 mg/dL — ABNORMAL HIGH (ref 70–99)
Potassium: 3.7 mmol/L (ref 3.5–5.1)
Sodium: 139 mmol/L (ref 135–145)

## 2022-03-01 LAB — GLUCOSE, CAPILLARY
Glucose-Capillary: 299 mg/dL — ABNORMAL HIGH (ref 70–99)
Glucose-Capillary: 320 mg/dL — ABNORMAL HIGH (ref 70–99)
Glucose-Capillary: 216 mg/dL — ABNORMAL HIGH (ref 70–99)
Glucose-Capillary: 285 mg/dL — ABNORMAL HIGH (ref 70–99)

## 2022-03-01 LAB — HIV ANTIBODY (ROUTINE TESTING W REFLEX): HIV Screen 4th Generation wRfx: NONREACTIVE

## 2022-03-01 MED ORDER — INSULIN ASPART 100 UNIT/ML IJ SOLN
5.0000 [IU] | Freq: Three times a day (TID) | INTRAMUSCULAR | Status: AC
  Administered 2022-03-01: 5 [IU] via SUBCUTANEOUS

## 2022-03-01 MED ORDER — INSULIN ASPART PROT & ASPART (70-30 MIX) 100 UNIT/ML ~~LOC~~ SUSP
18.0000 [IU] | Freq: Two times a day (BID) | SUBCUTANEOUS | Status: DC
  Administered 2022-03-02: 18 [IU] via SUBCUTANEOUS
  Filled 2022-03-01: qty 10

## 2022-03-01 MED ORDER — INSULIN ASPART 100 UNIT/ML IJ SOLN
3.0000 [IU] | Freq: Three times a day (TID) | INTRAMUSCULAR | Status: DC
  Administered 2022-03-01: 3 [IU] via SUBCUTANEOUS

## 2022-03-01 MED ORDER — INSULIN ASPART 100 UNIT/ML IJ SOLN
5.0000 [IU] | Freq: Three times a day (TID) | INTRAMUSCULAR | Status: DC
  Administered 2022-03-01: 5 [IU] via SUBCUTANEOUS

## 2022-03-01 MED ORDER — INSULIN GLARGINE-YFGN 100 UNIT/ML ~~LOC~~ SOLN
5.0000 [IU] | Freq: Once | SUBCUTANEOUS | Status: AC
  Administered 2022-03-01: 5 [IU] via SUBCUTANEOUS
  Filled 2022-03-01: qty 0.05

## 2022-03-01 MED ORDER — INSULIN GLARGINE-YFGN 100 UNIT/ML ~~LOC~~ SOLN
10.0000 [IU] | Freq: Every day | SUBCUTANEOUS | Status: DC
  Administered 2022-03-01: 10 [IU] via SUBCUTANEOUS
  Filled 2022-03-01: qty 0.1

## 2022-03-01 MED ORDER — LIVING WELL WITH DIABETES BOOK
Freq: Once | Status: AC
  Filled 2022-03-01: qty 1

## 2022-03-01 MED ORDER — INSULIN STARTER KIT- PEN NEEDLES (ENGLISH)
1.0000 | Freq: Once | Status: AC
  Administered 2022-03-01: 1
  Filled 2022-03-01: qty 1

## 2022-03-01 MED ORDER — INSULIN GLARGINE-YFGN 100 UNIT/ML ~~LOC~~ SOLN: 15.0000 [IU] | Freq: Every day | SUBCUTANEOUS | Status: DC

## 2022-03-01 NOTE — Progress Notes (Signed)
TRANSITION OF CARE VISIT   Date of Admission: 02/28/2022  Date of Discharge: 03/02/2022  Transitions of Care Call: 03/06/2022  Discharged from: [facility]  Discharge Diagnosis:  [place]/hyperosmolar hyperglycemia   New onset type 2 diabetes mellitus ([place])   Acute kidney injury superimposed on chronic kidney disease 3a ([place])   Essential hypertension   CVA (cerebral vascular accident) ([place])   BPH (benign prostatic hyperplasia)    Summary of Admission per MD note: Recommendations at discharge:    Started on NovoLog 70/30 FlexPen 20 units twice daily Hold off on the ramipril and repeat bmet at the follow-up appointment Patient instructed to check CBGs daily and keep a log for further adjustment in insulin regimen outpatient.    Hospital Course:   Patient is a 67 year old male with HTN, CVA presented with hyperglycemia.  Reported he was feeling dizzy and his sister checked his blood sugar and it was high.  He was having polyuria and polydipsia for about 3 weeks.  His blood sugars has been running high.  Not on any diabetes medications, although hemoglobin A1c was 8.0 on 09/07/2021. -On admission, glucose 592, creatinine 1.79, anion gap 15.  ABG showed pH 7.35, pCO2 52   Patient was admitted for diabetes mellitus, new onset, with hyperosmolar hyperglycemia   Assessment and Plan:    New onset type 2 diabetes mellitus ([place]urtis Saunders), HONK/hyperosmolar hyperglycemia -Hemoglobin A1c was 8.0 on 09/07/2021 however patient reported that he is not on any diabetes medications.   -On admission, glucose 592, creatinine 1.79, anion gap 15.  ABG showed pH 7.35, pCO2 52 -He was started on Endo tool/insulin drip, aggressive IV fluid hydration. -Patient has now been transitioned to subcu insulin.  He received education from diabetic coordinator and the dietitian.   -Hemoglobin A1c 14.4 -For discharge, NovoLog 70/30 FlexPen 20 units twice daily and  prescription given for insulin pen needles, glucometer     AKI on CKD stage IIIa likely worsened due to HONK, uncontrolled diabetes mellitus -Most recently creatinine was 1.5 on 02/23/2022 however has been higher in 2023 -Hold Lasix, ramipril.  Creatinine 1.79 on admission -Patient was placed on IV fluid hydration -Improved to 1.5, continue to hold Lasix, ramipril. -Repeat bmet outpatient, UA showed no proteinuria     Essential hypertension -BP stable, continue amlodipine -Lasix, ramipril held   History of CVA (cerebral vascular accident) (Curtis Saunders) -Continue aspirin, rosuvastatin       BPH (benign prostatic hyperplasia) -Continue Flomax   Today's visit 03/07/2022: During patient's appointment with me on 09/07/2021 patient reported that he had a history of diabetes. Patient reported he was diagnosed with diabetes at 67 y.o. but never took any medications for diabetes and stated "I outgrew diabetes." During the same encounter patient's hemoglobin A1c was checked and resulted 8.0% and he was started on Jardiance.  Subsequently patient followed up with me on 09/27/2021 and reported he had blood in his urine and urinary frequency the next day after beginning Jardiance. He also reported that he quit taking Jardiance and the blood in his urine resolved. Patient was referred to Endocrinology during the same encounter.   Today patient presents for hospital discharge follow-up. States that he is feeling "ok". He confirms that he is taking insulin as prescribed. He is checking his blood sugars at home and reports the results have been "down". He requests Accu-Check diabetic supplies to check his blood sugars at home. Reports he doesn't have much of an appetite. He is taking Amlodipine for blood pressure management. He  is established with Cardiology for management of the same and plans to follow-up with them soon for advisement on blood pressure medication regimen. He is taking Aspirin and Rosuvastatin as  prescribed and established with Neurology. He is established with Nephrology for management of chronic kidney disease IIIa. When asked of patient if he is taking Flomax he stated "They told me not to take that." referring to medical staff at hospital discharge. Patient does not have any further issues/concerns for discussion on today.  Patient/Caregiver self-reported problems/concerns: see above  MEDICATIONS  Medication Reconciliation conducted with patient/caregiver? (Yes/ No): yes  New medications prescribed/discontinued upon discharge? (Yes/No): yes  Barriers identified related to medications: no  LABS  Lab Reviewed (Yes/No/NA): yes  PHYSICAL EXAM:     03/07/2022   10:19 AM 03/02/2022    5:58 AM 03/01/2022    9:51 PM  Vitals with BMI  Height 5' 10.984"    Weight 210 lbs    BMI [id number]    Systolic  [id number]  Diastolic  92 69  Pulse  69 78    Physical Exam HENT:     Head: Normocephalic and atraumatic.  Eyes:     Extraocular Movements: Extraocular movements intact.     Conjunctiva/sclera: Conjunctivae normal.     Pupils: Pupils are equal, round, and reactive to light.  Cardiovascular:     Rate and Rhythm: Normal rate and regular rhythm.     Pulses: Normal pulses.     Heart sounds: Normal heart sounds.  Pulmonary:     Effort: Pulmonary effort is normal.     Breath sounds: Normal breath sounds.  Musculoskeletal:     Cervical back: Normal range of motion and neck supple.  Neurological:     General: No focal deficit present.     Mental Status: He is alert and oriented to person, place, and time.  Psychiatric:        Mood and Affect: Mood normal.        Behavior: Behavior normal.    ASSESSMENT AND PLAN: 1. Hospital discharge follow-up - Reviewed hospital course, current medications, ensured proper follow-up in place, and addressed concerns.   2. New onset type 2 diabetes mellitus (Curtis Saunders) 3. Type 2 diabetes mellitus with hyperosmolar nonketotic hyperglycemia ([facility]) -  Hemoglobin A1c 14.4% on 02/28/2022. Hemoglobin A1c goal 7.0%.  - Continue Insulin Detemir as prescribed. No refills needed as of present.  - Accu-Chek diabetic testing supplies ordered.  - Discussed the importance of healthy eating habits, low-carbohydrate diet, low-sugar diet, regular aerobic exercise (at least 150 minutes a week as tolerated) and medication compliance to achieve or maintain control of diabetes. - Patient has an appointment scheduled with Endocrinology on 06/16/2022. I sent a message to our referral coordinator, Maren Reamer, to see if we can get patient an earlier appointment. During the interim patient will follow-up with me in 4 weeks or sooner if needed.  - Blood Glucose Monitoring Suppl (ACCU-CHEK AVIVA PLUS) w/Device KIT; 1 each by Does not apply route 4 (four) times daily -  before meals and at bedtime.  Dispense: 1 kit; Refill: 0 - Lancets Misc. (ACCU-CHEK FASTCLIX LANCET) KIT; 1 each by Does not apply route 4 (four) times daily -  before meals and at bedtime.  Dispense: 1 kit; Refill: 2 - glucose blood (ACCU-CHEK GUIDE) test strip; Use as instructed  Dispense: 100 each; Refill: 2  4. AKI (acute kidney injury) ([place]) 5. Stage 3a chronic kidney disease ([place]) - Routine screening.  - Keep  all scheduled appointments with Nephrology.  - Basic Metabolic Panel  6. Primary hypertension - Patient today in office with no red flag symptoms related to hypertension. - Continue Amlodipine as prescribed. No refills needed as of present.  - Will continue to hold Lasix and Ramipril as discussed during hospital discharge instructions.  - Keep all scheduled appointments with Cardiology. Patient confirmed he plans to consult with Cardiology soon related to his blood pressure medication regimen.  7. History of CVA (cerebrovascular accident) - Continue Aspirin and Rosuvastatin as prescribed. No refills needed as of present. - Keep all scheduled appointments with Neurology.   8. Benign  prostatic hyperplasia, unspecified whether lower urinary tract symptoms present - Patient reports he is not taking Flomax as prescribed. Patient reported that while he was at the hospital he was told not to "take it". - Keep all scheduled appointments with Urology.    PATIENT EDUCATION PROVIDED: See AVS   FOLLOW-UP (Include any further testing or referrals):  - Keep all scheduled appointments with specialists.  - Follow-up with primary provider as scheduled.   Patient was given clear instructions to go to Emergency Department or return to medical center if symptoms don't improve, worsen, or new problems develop.The patient verbalized understanding.

## 2022-03-01 NOTE — Progress Notes (Signed)
Nutrition Note  RD consulted for nutrition education regarding diabetes.   Lab Results  Component Value Date   HGBA1C 14.4 (H) 02/28/2022    RD provided "Carbohydrate Counting for People with Diabetes" handout from the Academy of Nutrition and Dietetics. Discussed different food groups and their effects on blood sugar, emphasizing carbohydrate-containing foods. Provided list of carbohydrates and recommended serving sizes of common foods.  Discussed importance of controlled and consistent carbohydrate intake throughout the day. Provided examples of ways to balance meals/snacks and encouraged intake of high-fiber, whole grain complex carbohydrates. Teach back method used.  Expect poor-fair compliance. Pt listened to information given but was not overly interested in diet information at time of visit. Pt cut off education by going to the bathroom. Left handouts for pt to review. Pt did plan to drink less milk and koolaid.  Body mass index is 29.52 kg/m. Pt meets criteria for overweight based on current BMI.  Current diet order is CHO modified, patient is consuming approximately 100% of meals at this time. Labs and medications reviewed. No further nutrition interventions warranted at this time. If additional nutrition issues arise, please re-consult RD.  [name] Bibles, MS, RD, LDN Inpatient Clinical Dietitian Contact information available via Amion

## 2022-03-01 NOTE — Inpatient Diabetes Management (Addendum)
Inpatient Diabetes Program Recommendations  AACE/ADA: New Consensus Statement on Inpatient Glycemic Control (2015)  Target Ranges:  Prepandial:   less than 140 mg/dL      Peak postprandial:   less than 180 mg/dL (1-2 hours)      Critically ill patients:  140 - 180 mg/dL   Lab Results  Component Value Date   GLUCAP 299 (H) 03/01/2022   HGBA1C 14.4 (H) 02/28/2022    Review of Glycemic Control  Diabetes history: DM2 (remote hx) Outpatient Diabetes medications: None Current orders for Inpatient glycemic control: Novolog 0-15 TID with meals and 0-5 HS + 3 units TID  HgbA1C - 14.4%  Inpatient Diabetes Program Recommendations:    Consider adding Semglee 15 units QD  Will likely need approx 5 units TID of Novolog for meal coverage  See for diabetes education this am.   Thank you. Lorenda Peck, RD, LDN, CDCES Inpatient Diabetes Coordinator [phone]  Addendum:   Pt states he prefers 2 shots/day (70/30 BID) and agrees he will do this. Also willing to check blood sugars 2-3x/day. Has f/u appt with PCP on 2/13. Instructed to take blood sugar log.  Spoke with patient about new diabetes diagnosis.  Discussed A1C results (14.4%) and explained what an A1C is and informed patient that his current A1C indicates an average glucose of 375 mg/dl over the past 2-3 months. Discussed basic pathophysiology of DM Type 2, basic home care, importance of checking CBGs and maintaining good CBG control to prevent long-term and short-term complications. Reviewed glucose and A1C goals. Reviewed signs and symptoms of hyperglycemia and hypoglycemia along with treatment for both. Discussed impact of nutrition, exercise, stress, sickness, and medications on diabetes control. Reviewed Living Well with diabetes booklet and encouraged patient to read through entire book. Pt successfully administered his insulin at lunchtime and used proper technique. Will ask RN to allow pt to check CBGs when needed.  Educated  patient on insulin pen use at home. Reviewed contents of insulin flexpen starter kit. Reviewed all steps if insulin pen including attachment of needle, 2-unit air shot, dialing up dose, giving injection, removing needle, disposal of sharps, storage of unused insulin, disposal of insulin etc. Patient able to provide successful return demonstration. Also reviewed troubleshooting with insulin pen. MD to give patient Rxs for insulin pens and insulin pen needles. Answered all questions. Discussed with RN.  Thank you. Lorenda Peck, RD, LDN, [place] Inpatient Diabetes Coordinator [phone]

## 2022-03-01 NOTE — Progress Notes (Signed)
[name]                                                                              Curtis Saunders, is a 67 y.o. male, DOB - 1955-09-25, NWG:[id number] Admit date - 02/28/2022    Outpatient Primary MD for the patient is [name], Flonnie Hailstone, NP  LOS - 0  days  Chief Complaint  Patient presents with   Hyperglycemia       Brief summary   Patient is a 67 year old male with HTN, CVA presented with hyperglycemia.  Reported he was feeling dizzy and his sister checked his blood sugar and it was high.  He was having polyuria and polydipsia for about 3 weeks.  His blood sugars has been running high.  Not on any diabetes medications, although hemoglobin A1c was 8.0 on 09/07/2021. -On admission, glucose 592, creatinine 1.79, anion gap 15.  ABG showed pH 7.35, pCO2 52  Patient was admitted for diabetes mellitus, new onset, with hyperosmolar hyperglycemia  Assessment & Plan    Principal Problem:   New onset type 2 diabetes mellitus ([place]), HONK/hyperosmolar hyperglycemia -Hemoglobin A1c was 8.0 on 09/07/2021 however patient reported that he is not on any diabetes medications.   -On admission, glucose 592, creatinine 1.79, anion gap 15.  ABG showed pH 7.35, pCO2 52 -He was started on Endo tool/insulin drip, aggressive IV fluid hydration. -Now transition to subcu insulin, diabetic coordinator and dietitian consulted -Increase Semglee to 15 units daily, increase meal coverage to 5 units 3 times daily AC, continue sliding scale insulin CBG (last 3)  Recent Labs    02/28/22 2125 03/01/22 0709 03/01/22 1133  GLUCAP 280* 299* 320*     Active Problems: AKI on CKD stage IIIa likely worsened due to HONK, uncontrolled diabetes mellitus -Most recently creatinine was 1.5 on 02/23/2022 however has been higher in 2023 -Hold Lasix, ramipril -Continue IV fluid hydration, creatinine improving     Essential hypertension -BP stable, continue amlodipine, hold ramipril, Lasix  History of  CVA (cerebral vascular accident) ([place]) -Continue aspirin, rosuvastatin     BPH (benign prostatic hyperplasia) -Continue Flomax   Code Status: Full CODE STATUS DVT Prophylaxis:  enoxaparin (LOVENOX) injection 40 mg Start: 02/28/22 1600   Level of Care: Level of care: Med-Surg Family Communication: Updated patient Disposition Plan:      Remains inpatient appropriate: Plan to DC home tomorrow if CBGs, renal function continues to improve   Procedures:  None  Consultants:   None  Antimicrobials: None    Medications  amLODipine  10 mg Oral Daily   aspirin EC  81 mg Oral Daily   docusate sodium  100 mg Oral BID   enoxaparin (LOVENOX) injection  40 mg Subcutaneous Q24H   insulin aspart  0-15 Units Subcutaneous TID WC   insulin aspart  0-5 Units Subcutaneous QHS   insulin aspart  5 Units Subcutaneous TID WC   [START ON 03/02/2022] insulin glargine-yfgn  15 Units Subcutaneous Daily   insulin glargine-yfgn  5 Units Subcutaneous Once   insulin starter kit- pen needles  1 kit Other Once   living well with diabetes book   Does  not apply Once   rosuvastatin  20 mg Oral Daily   tamsulosin  0.4 mg Oral Daily      Subjective:   Curtis Saunders was seen and examined today.  No acute complaints today, states polydipsia and polyuria improving.  No nausea vomiting or diarrhea.  No abdominal pain.  CBGs still elevated.  On IV fluids.  Objective:   Vitals:   02/28/22 2128 03/01/22 0113 03/01/22 0555 03/01/22 0903  BP: 139/88 (!) 137/90 (!) 142/91 (!) 156/88  Pulse: 78 72 68 91  Resp: '20 18 18 18  '$ Temp: 98 F (36.7 C) 97.8 F (36.6 C) 98.1 F (36.7 C) (!) 97.5 F (36.4 C)  TempSrc:  Oral    SpO2: 98% 97% 99% 100%  Weight:      Height:        Intake/Output Summary (Last 24 hours) at 03/01/2022 1136 Last data filed at 03/01/2022 0858 Gross per 24 hour  Intake 2941.4 ml  Output --  Net 2941.4 ml     Wt Readings from Last 3 Encounters:  02/28/22 96 kg  02/23/22 96.5 kg   12/23/21 104 kg     Exam General: Alert and oriented x 3, NAD Cardiovascular: S1 S2 auscultated,  RRR Respiratory: Clear to auscultation bilaterally, no wheezing Gastrointestinal: Soft, nontender, nondistended, + bowel sounds Ext: no pedal edema bilaterally Neuro: Strength 5/5 upper and lower extremities bilaterally Skin: No rashes Psych: Normal affect and demeanor, alert and oriented x3     Data Reviewed:  I have personally reviewed following labs    CBC Lab Results  Component Value Date   WBC 11.6 (H) 03/01/2022   RBC 3.83 (L) 03/01/2022   HGB 11.1 (L) 03/01/2022   HCT 34.2 (L) 03/01/2022   MCV 89.3 03/01/2022   MCH 29.0 03/01/2022   PLT 160 03/01/2022   MCHC 32.5 03/01/2022   RDW 14.0 03/01/2022   LYMPHSABS 2.3 02/28/2022   MONOABS 0.5 02/28/2022   EOSABS 0.1 02/28/2022   BASOSABS 0.0 82/80/0349     Last metabolic panel Lab Results  Component Value Date   NA 139 03/01/2022   K 3.7 03/01/2022   CL 106 03/01/2022   CO2 23 03/01/2022   BUN 15 03/01/2022   CREATININE 1.47 (H) 03/01/2022   GLUCOSE 224 (H) 03/01/2022   GFRNONAA 53 (L) 03/01/2022   GFRAA >60 02/22/2019   CALCIUM 9.1 03/01/2022   PROT 7.8 02/28/2022   ALBUMIN 4.2 02/28/2022   LABGLOB 2.3 12/14/2020   AGRATIO 2.0 12/14/2020   BILITOT 0.8 02/28/2022   ALKPHOS 88 02/28/2022   AST 33 02/28/2022   ALT 33 02/28/2022   ANIONGAP 10 03/01/2022    CBG (last 3)  Recent Labs    02/28/22 2125 03/01/22 0709 03/01/22 1133  GLUCAP 280* 299* 320*      Coagulation Profile: No results for input(s): "INR", "PROTIME" in the last 168 hours.   Radiology Studies: I have personally reviewed the imaging studies  No results found.     Estill Cotta M.D. [name] 03/01/2022, 11:36 AM  Available via Epic secure chat 7am-7pm After 7 pm, please refer to night coverage provider listed on amion.

## 2022-03-02 ENCOUNTER — Other Ambulatory Visit: Payer: Self-pay | Admitting: Nurse Practitioner

## 2022-03-02 ENCOUNTER — Other Ambulatory Visit (HOSPITAL_COMMUNITY): Payer: Self-pay

## 2022-03-02 DIAGNOSIS — I63312 Cerebral infarction due to thrombosis of left middle cerebral artery: Secondary | ICD-10-CM | POA: Diagnosis not present

## 2022-03-02 DIAGNOSIS — N1831 Chronic kidney disease, stage 3a: Secondary | ICD-10-CM

## 2022-03-02 DIAGNOSIS — E119 Type 2 diabetes mellitus without complications: Secondary | ICD-10-CM | POA: Diagnosis not present

## 2022-03-02 DIAGNOSIS — N179 Acute kidney failure, unspecified: Secondary | ICD-10-CM | POA: Diagnosis not present

## 2022-03-02 LAB — BASIC METABOLIC PANEL
Anion gap: 6 (ref 5–15)
BUN: 15 mg/dL (ref 8–23)
CO2: 24 mmol/L (ref 22–32)
Calcium: 8.6 mg/dL — ABNORMAL LOW (ref 8.9–10.3)
Chloride: 107 mmol/L (ref 98–111)
Creatinine, Ser: 1.58 mg/dL — ABNORMAL HIGH (ref 0.61–1.24)
GFR, Estimated: 49 mL/min — ABNORMAL LOW (ref >60)
Glucose, Bld: 245 mg/dL — ABNORMAL HIGH (ref 70–99)
Potassium: 3.5 mmol/L (ref 3.5–5.1)
Sodium: 137 mmol/L (ref 135–145)

## 2022-03-02 LAB — GLUCOSE, CAPILLARY: Glucose-Capillary: 290 mg/dL — ABNORMAL HIGH (ref 70–99)

## 2022-03-02 LAB — CBC
HCT: 30.9 % — ABNORMAL LOW (ref 39.0–52.0)
Hemoglobin: 10.1 g/dL — ABNORMAL LOW (ref 13.0–17.0)
MCH: 29.2 pg (ref 26.0–34.0)
MCHC: 32.7 g/dL (ref 30.0–36.0)
MCV: 89.3 fL (ref 80.0–100.0)
Platelets: 135 10*3/uL — ABNORMAL LOW (ref 150–400)
RBC: 3.46 MIL/uL — ABNORMAL LOW (ref 4.22–5.81)
RDW: 14.3 % (ref 11.5–15.5)
WBC: 8.6 10*3/uL (ref 4.0–10.5)
nRBC: 0 % (ref 0.0–0.2)

## 2022-03-02 MED ORDER — NOVOLOG MIX 70/30 FLEXPEN (70-30) 100 UNIT/ML ~~LOC~~ SUPN: 20.0000 [IU] | PEN_INJECTOR | Freq: Two times a day (BID) | SUBCUTANEOUS | 11 refills | Status: DC

## 2022-03-02 MED ORDER — BLOOD GLUCOSE METER KIT: PACK | 0 refills | Status: AC

## 2022-03-02 MED ORDER — "PEN NEEDLES 3/16"" 31G X 5 MM MISC": 20.0000 [IU] | Freq: Two times a day (BID) | 3 refills | Status: DC

## 2022-03-02 MED ORDER — INSULIN DETEMIR 100 UNIT/ML FLEXPEN: 20.0000 [IU] | PEN_INJECTOR | Freq: Two times a day (BID) | SUBCUTANEOUS | 11 refills | Status: DC

## 2022-03-02 MED ORDER — TAMSULOSIN HCL 0.4 MG PO CAPS: 0.4000 mg | ORAL_CAPSULE | Freq: Every day | ORAL | 3 refills | Status: AC

## 2022-03-02 MED ORDER — INSULIN ASPART PROT & ASPART (70-30 MIX) 100 UNIT/ML ~~LOC~~ SUSP
20.0000 [IU] | Freq: Two times a day (BID) | SUBCUTANEOUS | Status: DC
  Filled 2022-03-02: qty 10

## 2022-03-02 MED ORDER — RAMIPRIL 10 MG PO CAPS: 10.0000 mg | ORAL_CAPSULE | Freq: Every day | ORAL | 3 refills | Status: DC

## 2022-03-02 NOTE — TOC Transition Note (Signed)
Transition of Care [facility]) - CM/SW Discharge Note  Patient Details  Name: Curtis Saunders MRN: [id number] Date of Birth: [birth year]  [facility] CM/SW Contact:  Sherie Don, LCSW Phone Number: 03/02/2022, 11:54 AM  Clinical Narrative: [facility] consulted for medication and PCP assistance. Patient has a primary care NP and is insured, so he is not eligible for medication assistance. CSW notified patient that TOC cannot provide medication assistance as he has insurance. TOC signing off.    Final next level of care: Home/Self Care Barriers to Discharge: Barriers Resolved  Patient Goals and CMS Choice Choice offered to / list presented to : NA  Discharge Plan and Services Additional resources added to the After Visit Summary for          DME Arranged: N/A DME Agency: NA  Social Determinants of Health (SDOH) Interventions SDOH Screenings   Food Insecurity: No Food Insecurity (03/01/2022)  Housing: Low Risk  (03/01/2022)  Transportation Needs: No Transportation Needs (03/01/2022)  Utilities: Not At Risk (03/01/2022)  Depression (PHQ2-9): Low Risk  (09/07/2021)  Tobacco Use: Low Risk  (02/28/2022)   Readmission Risk Interventions     No data to display

## 2022-03-02 NOTE — Discharge Summary (Signed)
Physician Discharge Summary   Patient: Curtis Saunders MRN: [id number] DOB: [birth year]  Admit date:     02/28/2022  Discharge date: 03/02/22  Discharge Physician: Estill Cotta, MD    PCP: Camillia Herter, NP   Recommendations at discharge:   Started on NovoLog 70/30 FlexPen 20 units twice daily Hold off on the ramipril and repeat bmet at the follow-up appointment Patient instructed to check CBGs daily and keep a log for further adjustment in insulin regimen outpatient.  Discharge Diagnoses:  [place]k/hyperosmolar hyperglycemia   New onset type 2 diabetes mellitus (HCC)   Acute kidney injury superimposed on chronic kidney disease 3a ([place])   Essential hypertension   CVA (cerebral vascular accident) ([place])   BPH (benign prostatic hyperplasia)     Hospital Course:  Patient is a 67 year old male with HTN, CVA presented with hyperglycemia.  Reported he was feeling dizzy and his sister checked his blood sugar and it was high.  He was having polyuria and polydipsia for about 3 weeks.  His blood sugars has been running high.  Not on any diabetes medications, although hemoglobin A1c was 8.0 on 09/07/2021. -On admission, glucose 592, creatinine 1.79, anion gap 15.  ABG showed pH 7.35, pCO2 52   Patient was admitted for diabetes mellitus, new onset, with hyperosmolar hyperglycemia  Assessment and Plan:   New onset type 2 diabetes mellitus ([place]), HONK/hyperosmolar hyperglycemia -Hemoglobin A1c was 8.0 on 09/07/2021 however patient reported that he is not on any diabetes medications.   -On admission, glucose 592, creatinine 1.79, anion gap 15.  ABG showed pH 7.35, pCO2 52 -He was started on Endo tool/insulin drip, aggressive IV fluid hydration. -Patient has now been transitioned to subcu insulin.  He received education from diabetic coordinator and the dietitian.   -Hemoglobin A1c 14.4 -For discharge, NovoLog 70/30 FlexPen 20 units twice daily and prescription given for insulin pen needles,  glucometer     AKI on CKD stage IIIa likely worsened due to HONK, uncontrolled diabetes mellitus -Most recently creatinine was 1.5 on 02/23/2022 however has been higher in 2023 -Hold Lasix, ramipril.  Creatinine 1.79 on admission -Patient was placed on IV fluid hydration -Improved to 1.5, continue to hold Lasix, ramipril. -Repeat bmet outpatient, UA showed no proteinuria       Essential hypertension -BP stable, continue amlodipine -Lasix, ramipril held   History of CVA (cerebral vascular accident) ([place]) -Continue aspirin, rosuvastatin       BPH (benign prostatic hyperplasia) -Continue Flomax     Pain control - [place] Controlled Substance Reporting System database was reviewed. and patient was instructed, not to drive, operate heavy machinery, perform activities at heights, swimming or participation in water activities or provide baby-sitting services while on Pain, Sleep and Anxiety Medications; until their outpatient Physician has advised to do so again. Also recommended to not to take more than prescribed Pain, Sleep and Anxiety Medications.  Consultants: None Procedures performed: None Disposition: Home Diet recommendation:  Discharge Diet Orders (From admission, onward)     Start     Ordered   03/02/22 0000  Diet Carb Modified        03/02/22 1150           Carb modified diet DISCHARGE MEDICATION: Allergies as of 03/02/2022       Reactions   Bee Venom Anaphylaxis        Medication List     STOP taking these medications    furosemide 20 MG tablet Commonly known as: LASIX  TAKE these medications    amLODipine 10 MG tablet Commonly known as: NORVASC Take 1 tablet by mouth once daily   aspirin EC 81 MG tablet Take 1 tablet (81 mg total) by mouth daily. Swallow whole.   blood glucose meter kit and supplies Dispense based on patient and insurance preference. Use up to four times daily as directed. (FOR ICD-10 E10.9, E11.9).   NovoLOG  Mix 70/30 FlexPen (70-30) 100 UNIT/ML FlexPen Generic drug: insulin aspart protamine - aspart Inject 20 Units into the skin 2 (two) times daily with a meal.   Pen Needles 3/16" 31G X 5 MM Misc 20 Units by Does not apply route 2 (two) times daily.   ramipril 10 MG capsule Commonly known as: ALTACE Take 1 capsule (10 mg total) by mouth daily. HOLD UNTIL FOLLOW-UP WITH YOUR DOCTOR What changed: additional instructions   rosuvastatin 20 MG tablet Commonly known as: CRESTOR Take 1 tablet by mouth once daily   tamsulosin 0.4 MG Caps capsule Commonly known as: FLOMAX Take 1 capsule (0.4 mg total) by mouth daily.        Follow-up Information     Camillia Herter, NP Follow up on 03/07/2022.   Specialty: Nurse Practitioner Why: Please keep your appointment on 2/13 and, obtain labs/metabolic panel for kidney function Contact information: [place] [address] [phone]                Discharge Exam: [name] Weights   02/28/22 1012  Weight: 96 kg   S: Looking forward to go home today, feels a lot better.  Motivated for dietary and lifestyle changes, feels that he will be able to do the insulin regimen  BP (!) 140/92 (BP Location: Right Arm)   Pulse 69   Temp 97.7 F (36.5 C) (Oral)   Resp 18   Ht '5\' 11"'$  (1.803 m)   Wt 96 kg   SpO2 99%   BMI 29.52 kg/m   Physical Exam General: Alert and oriented x 3, NAD Cardiovascular: S1 S2 clear, RRR.  Respiratory: CTAB, no wheezing, rales or rhonchi Gastrointestinal: Soft, nontender, nondistended, NBS Ext: no pedal edema bilaterally Neuro: no new deficits Skin: No rashes Psych: Normal affect and demeanor, alert and oriented x3   Condition at discharge: fair  The results of significant diagnostics from this hospitalization (including imaging, microbiology, ancillary and laboratory) are listed below for reference.   Imaging Studies: No results found.  Microbiology: Results for orders placed  or performed during the hospital encounter of 02/28/22  Urine Culture (for pregnant, neutropenic or urologic patients or patients with an indwelling urinary catheter)     Status: None (Preliminary result)   Collection Time: 02/28/22 10:46 AM   Specimen: Urine, Clean Catch  Result Value Ref Range Status   Specimen Description   Final    URINE, CLEAN CATCH Performed at [facility], [place] [address], [place], [place] [address]    Special Requests   Final    NONE Performed at [facility], [place] [address] [place], [place] [address]    Culture   Final    CULTURE REINCUBATED FOR BETTER GROWTH Performed at [facility], [place] [address], [place], [place] [address]    Report Status PENDING  Incomplete    Labs: CBC: Recent Labs  Lab 02/28/22 1055 03/01/22 0447 03/02/22 0553  WBC 11.1* 11.6* 8.6  NEUTROABS 8.2*  --   --   HGB 12.9* 11.1* 10.1*  HCT 39.7 34.2* 30.9*  MCV 87.8 89.3 89.3  PLT 192 160 712*   Basic Metabolic Panel: Recent Labs  Lab 02/28/22 1055 03/01/22 0447 03/02/22 0553  NA 132* 139 137  K 3.5 3.7 3.5  CL 97* 106 107  CO2 20* 23 24  GLUCOSE 592* 224* 245*  BUN '18 15 15  '$ CREATININE 1.79* 1.47* 1.58*  CALCIUM 9.4 9.1 8.6*   Liver Function Tests: Recent Labs  Lab 02/28/22 1055  AST 33  ALT 33  ALKPHOS 88  BILITOT 0.8  PROT 7.8  ALBUMIN 4.2   CBG: Recent Labs  Lab 03/01/22 0709 03/01/22 1133 03/01/22 1659 03/01/22 2148 03/02/22 0710  GLUCAP 299* 320* 216* 285* 290*    Discharge time spent: greater than 30 minutes.  Signed: Estill Cotta, MD [facility] 03/02/2022

## 2022-03-02 NOTE — Inpatient Diabetes Management (Signed)
Inpatient Diabetes Program Recommendations  AACE/ADA: New Consensus Statement on Inpatient Glycemic Control (2015)  Target Ranges:  Prepandial:   less than 140 mg/dL      Peak postprandial:   less than 180 mg/dL (1-2 hours)      Critically ill patients:  140 - 180 mg/dL   Lab Results  Component Value Date   GLUCAP 290 (H) 03/02/2022   HGBA1C 14.4 (H) 02/28/2022    Review of Glycemic Control  Diabetes history: New onset DM Outpatient Diabetes medications: None Current orders for Inpatient glycemic control: 70/30 20 units BID, Novolog 0-15 TID with meals and 0-5 HS  HgbA1C - 14.4%  Inpatient Diabetes Program Recommendations:    For discharge:  Novolog 70/30 flexpen 20 units BID (Order # [id number]) Pt copay is $4.00  Blood glucose meter kit (Order # [id number])  Insulin pen needles (#[id number])  Pt to make appt with PCP for f/u regarding his diabetes management. Has been instructed to take logbook for PCP to review and make insulin adjustments if needed.  Thank you. Lorenda Peck, RD, LDN, [place] Inpatient Diabetes Coordinator [phone]

## 2022-03-02 NOTE — Progress Notes (Signed)
Nurse reviewed discharge instructions with pt.  Pt verbalized understanding of discharge instructions, follow up appointments and new medications.  No concerns at time of discharge. 

## 2022-03-03 LAB — URINE CULTURE: Culture: 30000 — AB

## 2022-03-03 NOTE — Telephone Encounter (Signed)
Patient has been seen in the ER

## 2022-03-06 ENCOUNTER — Telehealth: Payer: Self-pay

## 2022-03-06 NOTE — Telephone Encounter (Signed)
Transition Care Management Unsuccessful Follow-up Telephone Call  Date of discharge and from where:  03/02/2022, [facility]  Attempts:  1st Attempt  Reason for unsuccessful TCM follow-up call:  Left voice message on  [phone], call back requested.   Patient has an appointment with Durene Fruits, NP at [facility] 03/07/2022.

## 2022-03-06 NOTE — Telephone Encounter (Signed)
Transition Care Management Follow-up Telephone Call  I called the patient back after I received a message that he returned my all from earlier today.  Date of discharge and from where: 03/02/2022, [facility]  How have you been since you were released from the hospital? He stated he is doing okay. Any questions or concerns? Yes- he said would like to know why PCP did not start him on any medications if he was a diabetic.    Items Reviewed: Did the pt receive and understand the discharge instructions provided? Yes  Medications obtained and verified? Yes - he said he has all of his medications and is doing great administering the insulin.  He also confirmed that he has a working glucometer.  He said his blood sugar this morning was 269 and that is "good" because it has been higher and is coming down.  Other? No  Any new allergies since your discharge? No  Dietary orders reviewed? No Do you have support at home? Yes   Home Care and Equipment/Supplies: Were home health services ordered? no If so, what is the name of the agency? N/a  Has the agency set up a time to come to the patient's home? not applicable Were any new equipment or medical supplies ordered?  No What is the name of the medical supply agency? N/a Were you able to get the supplies/equipment? not applicable Do you have any questions related to the use of the equipment or supplies? No  Functional Questionnaire: (I = Independent and D = Dependent) ADLs: independent  Follow up appointments reviewed:  PCP Hospital f/u appt confirmed? Yes  Scheduled to see Durene Fruits, NP - 03/07/2022.  [facility] f/u appt confirmed? Yes  Scheduled to see endocrinology - 06/16/2022.  Are transportation arrangements needed? No  If their condition worsens, is the pt aware to call PCP or go to the Emergency Dept.? Yes Was the patient provided with contact information for the PCP's office or ED? Yes Was to pt encouraged to call back  with questions or concerns? Yes

## 2022-03-07 ENCOUNTER — Other Ambulatory Visit: Payer: Self-pay

## 2022-03-07 ENCOUNTER — Ambulatory Visit (INDEPENDENT_AMBULATORY_CARE_PROVIDER_SITE_OTHER): Payer: Medicaid Other | Admitting: Family

## 2022-03-07 ENCOUNTER — Encounter: Payer: Self-pay | Admitting: Family

## 2022-03-07 ENCOUNTER — Telehealth: Payer: Self-pay | Admitting: Family

## 2022-03-07 ENCOUNTER — Other Ambulatory Visit: Payer: Self-pay | Admitting: Family

## 2022-03-07 VITALS — Temp 98.3°F | Resp 16 | Ht 70.98 in | Wt 210.0 lb

## 2022-03-07 DIAGNOSIS — Z09 Encounter for follow-up examination after completed treatment for conditions other than malignant neoplasm: Secondary | ICD-10-CM

## 2022-03-07 DIAGNOSIS — I10 Essential (primary) hypertension: Secondary | ICD-10-CM

## 2022-03-07 DIAGNOSIS — N1831 Chronic kidney disease, stage 3a: Secondary | ICD-10-CM

## 2022-03-07 DIAGNOSIS — N179 Acute kidney failure, unspecified: Secondary | ICD-10-CM

## 2022-03-07 DIAGNOSIS — Z8673 Personal history of transient ischemic attack (TIA), and cerebral infarction without residual deficits: Secondary | ICD-10-CM

## 2022-03-07 DIAGNOSIS — N400 Enlarged prostate without lower urinary tract symptoms: Secondary | ICD-10-CM

## 2022-03-07 DIAGNOSIS — E119 Type 2 diabetes mellitus without complications: Secondary | ICD-10-CM

## 2022-03-07 DIAGNOSIS — E1100 Type 2 diabetes mellitus with hyperosmolarity without nonketotic hyperglycemic-hyperosmolar coma (NKHHC): Secondary | ICD-10-CM

## 2022-03-07 MED ORDER — ACCU-CHEK GUIDE VI STRP: ORAL_STRIP | 2 refills | Status: AC

## 2022-03-07 MED ORDER — ACCU-CHEK AVIVA PLUS W/DEVICE KIT: 1.0000 | PACK | Freq: Three times a day (TID) | 0 refills | Status: DC

## 2022-03-07 MED ORDER — ACCU-CHEK FASTCLIX LANCET KIT: 1.0000 | PACK | Freq: Three times a day (TID) | 2 refills | Status: DC

## 2022-03-07 NOTE — Progress Notes (Signed)
.  Pt presents for hospital follow-up  -new onset of diabetes  -pt hospitalized from 02/06-02/08 -needs to know about resuming Ramipril '10mg'$   -needs Accu-check blood glucose monitoring kit sent into pharmacy

## 2022-03-08 LAB — BASIC METABOLIC PANEL
BUN/Creatinine Ratio: 11 (ref 10–24)
BUN: 15 mg/dL (ref 8–27)
CO2: 18 mmol/L — ABNORMAL LOW (ref 20–29)
Calcium: 9.8 mg/dL (ref 8.6–10.2)
Chloride: 106 mmol/L (ref 96–106)
Creatinine, Ser: 1.34 mg/dL — ABNORMAL HIGH (ref 0.76–1.27)
Glucose: 130 mg/dL — ABNORMAL HIGH (ref 70–99)
Potassium: 3.6 mmol/L (ref 3.5–5.2)
Sodium: 142 mmol/L (ref 134–144)
eGFR: 59 mL/min/{1.73_m2} — ABNORMAL LOW (ref >59)

## 2022-03-09 NOTE — Telephone Encounter (Signed)
Copied from [place]. Topic: General - Other >> Mar 09, 2022 11:20 AM Cyndi Bender wrote: Reason for CRM: [facility] reports that the Accu-Chek [facility]iva is no longer available so please send in a new Rx for Accu-Chek Guide.

## 2022-03-09 NOTE — Telephone Encounter (Signed)
I spoke w/pt on 02/13 after his visit due to Rx request for meter states out of stock, but pt stated that he did pick up a glucose meter

## 2022-03-10 ENCOUNTER — Other Ambulatory Visit: Payer: Self-pay

## 2022-03-10 ENCOUNTER — Inpatient Hospital Stay (HOSPITAL_COMMUNITY)
Admission: EM | Admit: 2022-03-10 | Discharge: 2022-03-16 | DRG: 726 | Disposition: A | Discharge status: Home / Self Care | Payer: Medicaid Other | Attending: Internal Medicine | Admitting: Internal Medicine

## 2022-03-10 DIAGNOSIS — Z833 Family history of diabetes mellitus: Secondary | ICD-10-CM

## 2022-03-10 DIAGNOSIS — Z87892 Personal history of anaphylaxis: Secondary | ICD-10-CM

## 2022-03-10 DIAGNOSIS — E876 Hypokalemia: Secondary | ICD-10-CM | POA: Diagnosis present

## 2022-03-10 DIAGNOSIS — E1122 Type 2 diabetes mellitus with diabetic chronic kidney disease: Secondary | ICD-10-CM | POA: Diagnosis present

## 2022-03-10 DIAGNOSIS — R339 Retention of urine, unspecified: Secondary | ICD-10-CM

## 2022-03-10 DIAGNOSIS — R338 Other retention of urine: Secondary | ICD-10-CM | POA: Diagnosis present

## 2022-03-10 DIAGNOSIS — N183 Chronic kidney disease, stage 3 unspecified: Secondary | ICD-10-CM | POA: Diagnosis present

## 2022-03-10 DIAGNOSIS — Z825 Family history of asthma and other chronic lower respiratory diseases: Secondary | ICD-10-CM

## 2022-03-10 DIAGNOSIS — N179 Acute kidney failure, unspecified: Principal | ICD-10-CM

## 2022-03-10 DIAGNOSIS — Z8673 Personal history of transient ischemic attack (TIA), and cerebral infarction without residual deficits: Secondary | ICD-10-CM

## 2022-03-10 DIAGNOSIS — E2749 Other adrenocortical insufficiency: Secondary | ICD-10-CM | POA: Diagnosis present

## 2022-03-10 DIAGNOSIS — Z6830 Body mass index (BMI) 30.0-30.9, adult: Secondary | ICD-10-CM

## 2022-03-10 DIAGNOSIS — Z8249 Family history of ischemic heart disease and other diseases of the circulatory system: Secondary | ICD-10-CM

## 2022-03-10 DIAGNOSIS — E669 Obesity, unspecified: Secondary | ICD-10-CM

## 2022-03-10 DIAGNOSIS — I7121 Aneurysm of the ascending aorta, without rupture: Secondary | ICD-10-CM | POA: Diagnosis present

## 2022-03-10 DIAGNOSIS — Z91030 Bee allergy status: Secondary | ICD-10-CM

## 2022-03-10 DIAGNOSIS — E785 Hyperlipidemia, unspecified: Secondary | ICD-10-CM | POA: Diagnosis present

## 2022-03-10 DIAGNOSIS — Z1152 Encounter for screening for COVID-19: Secondary | ICD-10-CM

## 2022-03-10 DIAGNOSIS — N401 Enlarged prostate with lower urinary tract symptoms: Principal | ICD-10-CM | POA: Diagnosis present

## 2022-03-10 DIAGNOSIS — I639 Cerebral infarction, unspecified: Secondary | ICD-10-CM | POA: Diagnosis present

## 2022-03-10 DIAGNOSIS — N400 Enlarged prostate without lower urinary tract symptoms: Secondary | ICD-10-CM | POA: Diagnosis present

## 2022-03-10 DIAGNOSIS — Z794 Long term (current) use of insulin: Secondary | ICD-10-CM

## 2022-03-10 DIAGNOSIS — Z79899 Other long term (current) drug therapy: Secondary | ICD-10-CM

## 2022-03-10 DIAGNOSIS — I129 Hypertensive chronic kidney disease with stage 1 through stage 4 chronic kidney disease, or unspecified chronic kidney disease: Secondary | ICD-10-CM | POA: Diagnosis present

## 2022-03-10 DIAGNOSIS — I10 Essential (primary) hypertension: Secondary | ICD-10-CM | POA: Diagnosis present

## 2022-03-10 DIAGNOSIS — Z7982 Long term (current) use of aspirin: Secondary | ICD-10-CM

## 2022-03-10 DIAGNOSIS — I6789 Other cerebrovascular disease: Secondary | ICD-10-CM | POA: Diagnosis present

## 2022-03-10 DIAGNOSIS — E1165 Type 2 diabetes mellitus with hyperglycemia: Secondary | ICD-10-CM

## 2022-03-10 DIAGNOSIS — N1831 Chronic kidney disease, stage 3a: Secondary | ICD-10-CM | POA: Diagnosis present

## 2022-03-10 DIAGNOSIS — I517 Cardiomegaly: Secondary | ICD-10-CM | POA: Diagnosis present

## 2022-03-10 DIAGNOSIS — K5900 Constipation, unspecified: Secondary | ICD-10-CM | POA: Diagnosis present

## 2022-03-10 LAB — CBC WITH DIFFERENTIAL/PLATELET
Abs Immature Granulocytes: 0.06 10*3/uL (ref 0.00–0.07)
Basophils Absolute: 0 10*3/uL (ref 0.0–0.1)
Basophils Relative: 0 %
Eosinophils Absolute: 0.1 10*3/uL (ref 0.0–0.5)
Eosinophils Relative: 1 %
HCT: 34.8 % — ABNORMAL LOW (ref 39.0–52.0)
Hemoglobin: 11.2 g/dL — ABNORMAL LOW (ref 13.0–17.0)
Immature Granulocytes: 1 %
Lymphocytes Relative: 16 %
Lymphs Abs: 1.8 10*3/uL (ref 0.7–4.0)
MCH: 29 pg (ref 26.0–34.0)
MCHC: 32.2 g/dL (ref 30.0–36.0)
MCV: 90.2 fL (ref 80.0–100.0)
Monocytes Absolute: 0.9 10*3/uL (ref 0.1–1.0)
Monocytes Relative: 8 %
Neutro Abs: 8.3 10*3/uL — ABNORMAL HIGH (ref 1.7–7.7)
Neutrophils Relative %: 74 %
Platelets: 217 10*3/uL (ref 150–400)
RBC: 3.86 MIL/uL — ABNORMAL LOW (ref 4.22–5.81)
RDW: 15 % (ref 11.5–15.5)
WBC: 11.2 10*3/uL — ABNORMAL HIGH (ref 4.0–10.5)
nRBC: 0 % (ref 0.0–0.2)

## 2022-03-10 LAB — COMPREHENSIVE METABOLIC PANEL
ALT: 35 U/L (ref 0–44)
AST: 24 U/L (ref 15–41)
Albumin: 3.8 g/dL (ref 3.5–5.0)
Alkaline Phosphatase: 67 U/L (ref 38–126)
Anion gap: 8 (ref 5–15)
BUN: 18 mg/dL (ref 8–23)
CO2: 23 mmol/L (ref 22–32)
Calcium: 9 mg/dL (ref 8.9–10.3)
Chloride: 103 mmol/L (ref 98–111)
Creatinine, Ser: 2.73 mg/dL — ABNORMAL HIGH (ref 0.61–1.24)
GFR, Estimated: 25 mL/min — ABNORMAL LOW (ref >60)
Glucose, Bld: 398 mg/dL — ABNORMAL HIGH (ref 70–99)
Potassium: 3.4 mmol/L — ABNORMAL LOW (ref 3.5–5.1)
Sodium: 134 mmol/L — ABNORMAL LOW (ref 135–145)
Total Bilirubin: 0.6 mg/dL (ref 0.3–1.2)
Total Protein: 6.9 g/dL (ref 6.5–8.1)

## 2022-03-10 LAB — LIPASE, BLOOD: Lipase: 40 U/L (ref 11–51)

## 2022-03-10 NOTE — ED Provider Triage Note (Signed)
Emergency Medicine Provider Triage Evaluation Note  Curtis Saunders , a 67 y.o. male  was evaluated in triage.  Pt complains of diffuse abdominal pain.  Patient has not had a bowel movement for 7 days.  He has tried laxative OTC with no relief.  Patient reports increased abdominal distention in the last 7 days.  No fever, nausea, vomiting, urinary symptoms.  Patient reports pain with defecation due to internal hemorrhoid and straining.  Review of Systems  Positive: As above Negative: As above  Physical Exam  BP (!) 147/99 (BP Location: Left Arm)   Pulse (!) 101   Temp 98.7 F (37.1 C) (Oral)   Resp (!) 23   SpO2 100%  Gen:   Awake, no distress   Resp:  Normal effort  MSK:   Moves extremities without difficulty  Other:    Medical Decision Making  Medically screening exam initiated at 10:16 PM.  Appropriate orders placed.  Curtis Saunders was informed that the remainder of the evaluation will be completed by another provider, this initial triage assessment does not replace that evaluation, and the importance of remaining in the ED until their evaluation is complete.    Rex Kras, [place] 03/11/22 [phone]

## 2022-03-10 NOTE — ED Triage Notes (Signed)
Pt via POV c/o pressure-like mid and LLQ abdominal pain with constipation. NO NVD Endorses constipation ongoing x 2 months. Has been taking OTC stool softeners without relief. Unable to states last BM. Abdomen distended and tender on palpation.

## 2022-03-11 ENCOUNTER — Emergency Department (HOSPITAL_COMMUNITY): Payer: Medicaid Other

## 2022-03-11 ENCOUNTER — Encounter (HOSPITAL_COMMUNITY): Payer: Self-pay

## 2022-03-11 DIAGNOSIS — R339 Retention of urine, unspecified: Secondary | ICD-10-CM

## 2022-03-11 DIAGNOSIS — N183 Chronic kidney disease, stage 3 unspecified: Secondary | ICD-10-CM

## 2022-03-11 DIAGNOSIS — N400 Enlarged prostate without lower urinary tract symptoms: Secondary | ICD-10-CM

## 2022-03-11 DIAGNOSIS — N179 Acute kidney failure, unspecified: Secondary | ICD-10-CM

## 2022-03-11 DIAGNOSIS — R338 Other retention of urine: Secondary | ICD-10-CM | POA: Diagnosis not present

## 2022-03-11 DIAGNOSIS — I7121 Aneurysm of the ascending aorta, without rupture: Secondary | ICD-10-CM

## 2022-03-11 DIAGNOSIS — Z794 Long term (current) use of insulin: Secondary | ICD-10-CM

## 2022-03-11 DIAGNOSIS — E669 Obesity, unspecified: Secondary | ICD-10-CM

## 2022-03-11 DIAGNOSIS — I517 Cardiomegaly: Secondary | ICD-10-CM | POA: Diagnosis not present

## 2022-03-11 DIAGNOSIS — I10 Essential (primary) hypertension: Secondary | ICD-10-CM

## 2022-03-11 DIAGNOSIS — E1165 Type 2 diabetes mellitus with hyperglycemia: Secondary | ICD-10-CM

## 2022-03-11 LAB — URINALYSIS, ROUTINE W REFLEX MICROSCOPIC
Bilirubin Urine: NEGATIVE
Glucose, UA: >=500 mg/dL — AB
Ketones, ur: NEGATIVE mg/dL
Nitrite: NEGATIVE
Protein, ur: NEGATIVE mg/dL
Specific Gravity, Urine: 1.009 (ref 1.005–1.030)
WBC, UA: >50 WBC/hpf (ref 0–5)
pH: 5 (ref 5.0–8.0)

## 2022-03-11 LAB — BASIC METABOLIC PANEL
Anion gap: 7 (ref 5–15)
BUN: 16 mg/dL (ref 8–23)
CO2: 25 mmol/L (ref 22–32)
Calcium: 8.8 mg/dL — ABNORMAL LOW (ref 8.9–10.3)
Chloride: 108 mmol/L (ref 98–111)
Creatinine, Ser: 2.39 mg/dL — ABNORMAL HIGH (ref 0.61–1.24)
GFR, Estimated: 30 mL/min — ABNORMAL LOW (ref >60)
Glucose, Bld: 236 mg/dL — ABNORMAL HIGH (ref 70–99)
Potassium: 3.6 mmol/L (ref 3.5–5.1)
Sodium: 140 mmol/L (ref 135–145)

## 2022-03-11 LAB — LIPID PANEL
Cholesterol: 111 mg/dL (ref 0–200)
HDL: 52 mg/dL (ref >40)
LDL Cholesterol: 49 mg/dL (ref 0–99)
Total CHOL/HDL Ratio: 2.1 RATIO
Triglycerides: 50 mg/dL (ref <150)
VLDL: 10 mg/dL (ref 0–40)

## 2022-03-11 LAB — RESP PANEL BY RT-PCR (RSV, FLU A&B, COVID)  RVPGX2
Influenza A by PCR: NEGATIVE
Influenza B by PCR: NEGATIVE
Resp Syncytial Virus by PCR: NEGATIVE
SARS Coronavirus 2 by RT PCR: NEGATIVE

## 2022-03-11 LAB — PSA: Prostatic Specific Antigen: 3.66 ng/mL (ref 0.00–4.00)

## 2022-03-11 LAB — PROCALCITONIN: Procalcitonin: 0.1 ng/mL

## 2022-03-11 LAB — LACTIC ACID, PLASMA
Lactic Acid, Venous: 0.9 mmol/L (ref 0.5–1.9)
Lactic Acid, Venous: 0.9 mmol/L (ref 0.5–1.9)

## 2022-03-11 LAB — GLUCOSE, CAPILLARY
Glucose-Capillary: 191 mg/dL — ABNORMAL HIGH (ref 70–99)
Glucose-Capillary: 290 mg/dL — ABNORMAL HIGH (ref 70–99)

## 2022-03-11 MED ORDER — TAMSULOSIN HCL 0.4 MG PO CAPS
0.4000 mg | ORAL_CAPSULE | Freq: Every day | ORAL | Status: DC
  Administered 2022-03-11: 0.4 mg via ORAL
  Filled 2022-03-11: qty 1

## 2022-03-11 MED ORDER — SENNA 8.6 MG PO TABS
1.0000 | ORAL_TABLET | Freq: Two times a day (BID) | ORAL | Status: DC
  Administered 2022-03-11 – 2022-03-16 (×10): 8.6 mg via ORAL
  Filled 2022-03-11 (×10): qty 1

## 2022-03-11 MED ORDER — TRAZODONE HCL 50 MG PO TABS
25.0000 mg | ORAL_TABLET | Freq: Every evening | ORAL | Status: DC | PRN
  Administered 2022-03-14 – 2022-03-15 (×2): 25 mg via ORAL
  Filled 2022-03-11 (×3): qty 1

## 2022-03-11 MED ORDER — ONDANSETRON HCL 4 MG PO TABS: 4.0000 mg | ORAL_TABLET | Freq: Four times a day (QID) | ORAL | Status: DC | PRN

## 2022-03-11 MED ORDER — ACETAMINOPHEN 650 MG RE SUPP: 650.0000 mg | Freq: Four times a day (QID) | RECTAL | Status: DC | PRN

## 2022-03-11 MED ORDER — ACETAMINOPHEN 325 MG PO TABS
650.0000 mg | ORAL_TABLET | Freq: Four times a day (QID) | ORAL | Status: DC | PRN
  Filled 2022-03-11 (×2): qty 2

## 2022-03-11 MED ORDER — ASPIRIN 81 MG PO TBEC
81.0000 mg | DELAYED_RELEASE_TABLET | Freq: Every day | ORAL | Status: DC
  Administered 2022-03-11 – 2022-03-16 (×6): 81 mg via ORAL
  Filled 2022-03-11 (×6): qty 1

## 2022-03-11 MED ORDER — OXYCODONE HCL 5 MG PO TABS
5.0000 mg | ORAL_TABLET | ORAL | Status: DC | PRN
  Administered 2022-03-14 – 2022-03-15 (×3): 5 mg via ORAL
  Filled 2022-03-11 (×4): qty 1

## 2022-03-11 MED ORDER — LIDOCAINE HCL URETHRAL/MUCOSAL 2 % EX GEL
1.0000 | Freq: Once | CUTANEOUS | Status: AC
  Administered 2022-03-11: 1 via URETHRAL
  Filled 2022-03-11: qty 11

## 2022-03-11 MED ORDER — HEPARIN SODIUM (PORCINE) 5000 UNIT/ML IJ SOLN
5000.0000 [IU] | Freq: Three times a day (TID) | INTRAMUSCULAR | Status: DC
  Administered 2022-03-11 – 2022-03-16 (×15): 5000 [IU] via SUBCUTANEOUS
  Filled 2022-03-11 (×15): qty 1

## 2022-03-11 MED ORDER — MORPHINE SULFATE (PF) 4 MG/ML IV SOLN
4.0000 mg | Freq: Once | INTRAVENOUS | Status: AC
  Administered 2022-03-11: 4 mg via INTRAVENOUS
  Filled 2022-03-11 (×2): qty 1

## 2022-03-11 MED ORDER — LACTATED RINGERS IV BOLUS (SEPSIS)
75.0000 mL | Freq: Once | INTRAVENOUS | Status: AC
  Administered 2022-03-11: 75 mL via INTRAVENOUS

## 2022-03-11 MED ORDER — INSULIN ASPART 100 UNIT/ML IJ SOLN
0.0000 [IU] | INTRAMUSCULAR | Status: DC
  Administered 2022-03-11: 3 [IU] via SUBCUTANEOUS
  Administered 2022-03-12: 15 [IU] via SUBCUTANEOUS
  Administered 2022-03-12: 8 [IU] via SUBCUTANEOUS
  Administered 2022-03-12: 3 [IU] via SUBCUTANEOUS
  Administered 2022-03-12: 5 [IU] via SUBCUTANEOUS
  Administered 2022-03-12 – 2022-03-14 (×8): 3 [IU] via SUBCUTANEOUS
  Administered 2022-03-14 – 2022-03-16 (×5): 2 [IU] via SUBCUTANEOUS
  Administered 2022-03-16: 3 [IU] via SUBCUTANEOUS
  Filled 2022-03-11: qty 0.15

## 2022-03-11 MED ORDER — SODIUM CHLORIDE 0.9 % IV SOLN
1.0000 g | Freq: Once | INTRAVENOUS | Status: AC
  Administered 2022-03-11: 1 g via INTRAVENOUS
  Filled 2022-03-11: qty 10

## 2022-03-11 MED ORDER — ONDANSETRON HCL 4 MG/2ML IJ SOLN
4.0000 mg | Freq: Four times a day (QID) | INTRAMUSCULAR | Status: DC | PRN
  Administered 2022-03-14 – 2022-03-15 (×2): 4 mg via INTRAVENOUS
  Filled 2022-03-11 (×3): qty 2

## 2022-03-11 MED ORDER — LACTATED RINGERS IV BOLUS
1000.0000 mL | Freq: Once | INTRAVENOUS | Status: AC
  Administered 2022-03-11: 1000 mL via INTRAVENOUS

## 2022-03-11 MED ORDER — TAMSULOSIN HCL 0.4 MG PO CAPS
0.4000 mg | ORAL_CAPSULE | Freq: Two times a day (BID) | ORAL | Status: DC
  Administered 2022-03-11 – 2022-03-16 (×10): 0.4 mg via ORAL
  Filled 2022-03-11 (×10): qty 1

## 2022-03-11 MED ORDER — SODIUM CHLORIDE 0.9 % IV SOLN
2.0000 g | INTRAVENOUS | Status: DC
  Administered 2022-03-12 – 2022-03-16 (×5): 2 g via INTRAVENOUS
  Filled 2022-03-11 (×4): qty 20

## 2022-03-11 MED ORDER — DOCUSATE SODIUM 100 MG PO CAPS
100.0000 mg | ORAL_CAPSULE | Freq: Two times a day (BID) | ORAL | Status: DC
  Administered 2022-03-11 – 2022-03-16 (×11): 100 mg via ORAL
  Filled 2022-03-11 (×11): qty 1

## 2022-03-11 MED ORDER — SODIUM CHLORIDE 0.9 % IV SOLN
1.0000 g | INTRAVENOUS | Status: AC
  Administered 2022-03-12: 1 g via INTRAVENOUS
  Filled 2022-03-11: qty 10

## 2022-03-11 MED ORDER — ROSUVASTATIN CALCIUM 20 MG PO TABS
20.0000 mg | ORAL_TABLET | Freq: Every day | ORAL | Status: DC
  Administered 2022-03-12 – 2022-03-16 (×5): 20 mg via ORAL
  Filled 2022-03-11 (×6): qty 1

## 2022-03-11 NOTE — ED Provider Notes (Signed)
[place] EMERGENCY DEPARTMENT AT [facility] Provider Note   CSN: [id number] Arrival date & time: 03/10/22  2151     History  Chief Complaint  Patient presents with   Abdominal Pain    Curtis Saunders is a 67 y.o. male who presents emergency department with 1 week of constipation and with 2 days of significantly decreased urine output.  States he feels like he needs to urinate but is only able to pass very small dribbles of urine.  Only urinated once around 5 PM today, scant amount, here unable to provide small amount of urine in the urinal despite lower abdominal distention and tenderness to palpation.  I personally read his medical records previous history of BPH, hypertension, LVH, CVA, CKD stage III with baseline creatinine near 1.4.  He is not anticoagulated.  Endorses compliance with multiple medications for hypertension, insulin-dependent diabetic. HPI     Home Medications Prior to Admission medications   Medication Sig Start Date End Date Taking? Authorizing Provider  amLODipine (NORVASC) 10 MG tablet Take 1 tablet by mouth once daily 09/12/21  Yes Minus Breeding, MD  aspirin EC 81 MG EC tablet Take 1 tablet (81 mg total) by mouth daily. Swallow whole. 02/28/20  Yes Barb Merino, MD  insulin detemir (LEVEMIR) 100 UNIT/ML FlexPen Inject 20 Units into the skin 2 (two) times daily. 03/02/22  Yes Samella Parr, NP  ramipril (ALTACE) 10 MG capsule Take 1 capsule (10 mg total) by mouth daily. HOLD UNTIL FOLLOW-UP WITH YOUR DOCTOR 03/02/22  Yes Rai, Ripudeep K, MD  rosuvastatin (CRESTOR) 20 MG tablet Take 1 tablet by mouth once daily 02/27/22  Yes McCue, [name], NP  tamsulosin (FLOMAX) 0.4 MG CAPS capsule Take 1 capsule (0.4 mg total) by mouth daily. 03/02/22  Yes Rai, Vernelle Emerald, MD  Accu-Chek FastClix Lancets MISC Apply topically. 03/07/22   [provider]  blood glucose meter kit and supplies Dispense based on patient and insurance preference. Use up to  four times daily as directed. (FOR ICD-10 E10.9, E11.9). 03/02/22   Rai, Ripudeep K, MD  Blood Glucose Monitoring Suppl (ACCU-CHEK AVIVA PLUS) w/Device KIT 1 each by Does not apply route 4 (four) times daily -  before meals and at bedtime. 03/07/22   Camillia Herter, NP  glucose blood (ACCU-CHEK GUIDE) test strip Use as instructed 03/07/22   Camillia Herter, NP  Insulin Pen Needle (PEN NEEDLES 3/16") 31G X 5 MM MISC 20 Units by Does not apply route 2 (two) times daily. 03/02/22   Rai, Vernelle Emerald, MD  Lancets Misc. (ACCU-CHEK FASTCLIX LANCET) KIT 1 each by Does not apply route 4 (four) times daily -  before meals and at bedtime. 03/07/22   Camillia Herter, NP      Allergies    Bee venom    Review of Systems   Review of Systems  Constitutional:  Positive for appetite change. Negative for activity change, fatigue and fever.  HENT: Negative.    Respiratory: Negative.    Cardiovascular: Negative.   Gastrointestinal:  Positive for abdominal distention, abdominal pain and constipation. Negative for diarrhea, nausea and vomiting.  Genitourinary:  Positive for decreased urine volume.  Neurological: Negative.   Hematological: Negative.     Physical Exam Updated Vital Signs BP (!) 139/92   Pulse 70   Temp 98.7 F (37.1 C) (Oral)   Resp 18   Ht 5' 10"$  (1.778 m)   Wt 95 kg   SpO2 99%  BMI 30.05 kg/m  Physical Exam Vitals and nursing note reviewed.  Constitutional:      Appearance: He is not ill-appearing or toxic-appearing.  HENT:     Head: Normocephalic and atraumatic.     Mouth/Throat:     Mouth: Mucous membranes are moist.     Pharynx: No oropharyngeal exudate or posterior oropharyngeal erythema.  Eyes:     General:        Right eye: No discharge.        Left eye: No discharge.     Conjunctiva/sclera: Conjunctivae normal.  Cardiovascular:     Rate and Rhythm: Normal rate and regular rhythm.     Pulses: Normal pulses.     Heart sounds: Normal heart sounds. No murmur  heard. Pulmonary:     Effort: Pulmonary effort is normal. No respiratory distress.     Breath sounds: Normal breath sounds. No wheezing or rales.  Abdominal:     General: Bowel sounds are normal. There is distension.     Palpations: Abdomen is soft.     Tenderness: There is generalized abdominal tenderness and tenderness in the suprapubic area and left lower quadrant. There is guarding. There is no right CVA tenderness, left CVA tenderness or rebound.     Hernia: A hernia is present. Hernia is present in the umbilical area.  Musculoskeletal:        General: No deformity.     Cervical back: Neck supple.  Skin:    General: Skin is warm and dry.     Capillary Refill: Capillary refill takes less than 2 seconds.  Neurological:     General: No focal deficit present.     Mental Status: He is alert and oriented to person, place, and time. Mental status is at baseline.  Psychiatric:        Mood and Affect: Mood normal.     ED Results / Procedures / Treatments   Labs (all labs ordered are listed, but only abnormal results are displayed) Labs Reviewed  CBC WITH DIFFERENTIAL/PLATELET - Abnormal; Notable for the following components:      Result Value   WBC 11.2 (*)    RBC 3.86 (*)    Hemoglobin 11.2 (*)    HCT 34.8 (*)    Neutro Abs 8.3 (*)    All other components within normal limits  COMPREHENSIVE METABOLIC PANEL - Abnormal; Notable for the following components:   Sodium 134 (*)    Potassium 3.4 (*)    Glucose, Bld 398 (*)    Creatinine, Ser 2.73 (*)    GFR, Estimated 25 (*)    All other components within normal limits  URINALYSIS, ROUTINE W REFLEX MICROSCOPIC - Abnormal; Notable for the following components:   APPearance CLOUDY (*)    Glucose, UA >=500 (*)    Hgb urine dipstick SMALL (*)    Leukocytes,Ua LARGE (*)    Bacteria, UA RARE (*)    All other components within normal limits  LIPASE, BLOOD  BASIC METABOLIC PANEL    EKG None  Radiology CT ABDOMEN PELVIS WO  CONTRAST  Result Date: 03/11/2022 CLINICAL DATA:  abdominal pain and distention. Pt complains of diffuse abdominal pain. Patient has not had a bowel movement for 7 days. He has tried laxative OTC with no relief. Patient reports increased abdominal distention in the last 7 days. No fever, nausea, vomiting EXAM: CT ABDOMEN AND PELVIS WITHOUT CONTRAST TECHNIQUE: Multidetector CT imaging of the abdomen and pelvis was performed following the standard protocol  without IV contrast. RADIATION DOSE REDUCTION: This exam was performed according to the departmental dose-optimization program which includes automated exposure control, adjustment of the mA and/or kV according to patient size and/or use of iterative reconstruction technique. COMPARISON:  CT abdomen pelvis 07/22/2018 FINDINGS: Lower chest: Trace pericardial effusion. Tiny hiatal hernia. No acute abnormality. Hepatobiliary: No focal liver abnormality. No gallstones, gallbladder wall thickening, or pericholecystic fluid. No biliary dilatation. Pancreas: No focal lesion. Normal pancreatic contour. No surrounding inflammatory changes. No main pancreatic ductal dilatation. Spleen: Normal in size without focal abnormality.  Splenule noted. Adrenals/Urinary Tract: No adrenal nodule bilaterally. No nephrolithiasis and no hydronephrosis. Fluid density lesions within the right kidney likely represent simple renal cysts. Simple renal cysts, in the absence of clinically indicated signs/symptoms, require no independent follow-up. No ureterolithiasis or hydroureter. Perivesicular fat stranding. Stomach/Bowel: Stomach is within normal limits. No evidence of bowel wall thickening or dilatation. Status post appendectomy. Vascular/Lymphatic: No abdominal aorta or iliac aneurysm. Mild atherosclerotic plaque of the aorta and its branches. No abdominal, pelvic, or inguinal lymphadenopathy. Reproductive: Prominent prostate measuring up to 4.7 cm. Other: No intraperitoneal free fluid.  No intraperitoneal free gas. No organized fluid collection. Musculoskeletal: Small fat containing umbilical hernia. No suspicious lytic or blastic osseous lesions. No acute displaced fracture. IMPRESSION: 1. Findings suggestive of cystitis.  Correlate with urinalysis. 2. Tiny hiatal hernia. Electronically Signed   By: Iven Finn M.D.   On: 03/11/2022 01:09    Procedures Procedures    Medications Ordered in ED Medications  morphine (PF) 4 MG/ML injection 4 mg (4 mg Intravenous Given 03/11/22 0154)  lactated ringers bolus 1,000 mL (1,000 mLs Intravenous New Bag/Given 03/11/22 0154)  lidocaine (XYLOCAINE) 2 % jelly 1 Application (1 Application Urethral Given 03/11/22 0300)  cefTRIAXone (ROCEPHIN) 1 g in sodium chloride 0.9 % 100 mL IVPB (1 g Intravenous New Bag/Given 03/11/22 [id number])    ED Course/ Medical Decision Making/ A&P Clinical Course as of 03/11/22 0603  Sat Mar 11, 2022  0241 Bladder scan with 800 cc. Patietn appears to be retaining urine, will order foley placement and admit for urinary retention with AKI.  [RS]  0311 Consult to Dr. Hal Hope, hospitalist, who is requesting repeat metabolic panel at 6 AM to reevaluate for creatinine after administration of IV fluid resuscitation.  If creatinine not improving at that time recommends repeat consultation to hospital medicine service, however if improving he recommends outpatient follow-up with urology.  I appreciate his collaboration in the care of this patient. [RS]    Clinical Course User Index [RS] Kyshawn Teal, Gypsy Balsam, PA-C                             Medical Decision Making 67 year old male who presents with concern for abdominal pain, constipation, and decreased urine output.  Hypertension on intake, vital signs otherwise normal.  Cardiopulmonary exam is normal, abdominal exam is as above with significant tenderness palpation, distention..  Differential for this patient's pain is broad and is includes but is not limited to  acute urinary retention, cystitis, nephrolithiasis, diverticulitis, appendicitis, bowel obstruction, constipation with fecal impaction, pyelonephritis.  Amount and/or Complexity of Data Reviewed Labs: ordered.    Details: CBC with mildly ketosis of 11,000, anemia with hemoglobin of 11 near patient's baseline.  CMP with hyponatremia 134, mild hypokalemia of 3.4.  Hyperglycemia with glucose of 398.  Acute kidney injury with creatinine doubled from baseline.  Baseline 1.3 today is 2.7.   Lipase  is normal  Radiology:     Details: CT of the abdomen pelvis without contrast due to AKI suggests cystitis with significant distention of the bladder.  Small hiatal hernia, no evidence of bowel obstruction there is moderate amount of stool throughout the bowel on visualized and of the images by this provider.  Bladder scan with approximately 800 cc in the bladder.  Risk Prescription drug management. Decision regarding hospitalization.   Foley placed with initial output of 1300 cc, clamped at this time.  Clinical picture most consistent with AKI secondary to acute urinary retention likely caused by patient's BPH .  UA pending at this time.    Initial recommendation was for admission to the hospital, however case discussed with hospitalist Dr. Hal Hope who recommends repeating BMP after fluid bolus and a few hours to evaluate for improvement in creatinine.  If improving, AKI likely related to bladder outlet obstruction which has now been resolved.  Patient at that point would potentially be stable to follow-up in the outpatient setting with urology.  If failing to improve his creatinine he requests repeat consultation to hospitalist for admission at that time.  Care of this patient signed out to oncoming ED provider S. Blue,  PA-C at time of shift change. Patient pending repeat BMP to reevaluate Cr. Dispo pending labs and reevaluation.  Linken  voiced understanding of his medical evaluation and treatment  plan. Each of their questions answered to their expressed satisfaction.   This chart was dictated using voice recognition software, Dragon. Despite the best efforts of this provider to proofread and correct errors, errors may still occur which can change documentation meaning.  Final Clinical Impression(s) / ED Diagnoses Final diagnoses:  AKI (acute kidney injury) [facility])  Urinary retention    Rx / DC Orders ED Discharge Orders     None         Aura Dials 03/11/22 0603    Molpus, Jenny Reichmann, MD 03/11/22 [phone]

## 2022-03-11 NOTE — ED Notes (Signed)
ED TO INPATIENT HANDOFF REPORT  ED Nurse Name and Phone #: Abner Greenspan  S Name/Age/Gender Curtis Saunders 67 y.o. male Room/Bed: WA11/WA11  Code Status   Code Status: Full Code  Home/SNF/Other Home Patient oriented to: self, place, time, and situation Is this baseline? Yes   Triage Complete: Triage complete  Chief Complaint Acute retention of urine [R33.8]  Triage Note Pt via POV c/o pressure-like mid and LLQ abdominal pain with constipation. NO NVD Endorses constipation ongoing x 2 months. Has been taking OTC stool softeners without relief. Unable to states last BM. Abdomen distended and tender on palpation.    Allergies Allergies  Allergen Reactions   Bee Venom Anaphylaxis    Level of Care/Admitting Diagnosis ED Disposition     ED Disposition  Admit   Condition  --   Comment  Hospital Area: [place] [id number]  Level of Care: Med-Surg [16]  May place patient in observation at [facility] or [place] if equivalent level of care is available:: Yes  Covid Evaluation: Confirmed COVID Negative  Diagnosis: Acute retention of urine [id number]  Admitting Physician: Allie Bossier [id number]  Attending Physician: Allie Bossier [id number]          B Medical/Surgery History Past Medical History:  Diagnosis Date   Diabetes mellitus ([place])    Hypertension    Stroke ([place]) 2023   x 2, no deficits   Past Surgical History:  Procedure Laterality Date   APPENDECTOMY     EYE SURGERY     HAND SURGERY     KNEE SURGERY     x3, x1 R   LIPOMA EXCISION Left 10/13/2019   Procedure: [place];  Surgeon: Tania Ade, MD;  Location: [place];  Service: Orthopedics;  Laterality: Left;   TONSILLECTOMY       A IV Location/Drains/Wounds Patient Lines/Drains/Airways Status     Active Line/Drains/Airways     Name Placement date Placement time Site Days   Peripheral IV 03/10/22 20  G 1" Right Antecubital 03/10/22  2252  Antecubital  1   Urethral Catheter Chelsey RN Straight-tip 03/11/22  0229  Straight-tip  less than 1            Intake/Output Last 24 hours  Intake/Output Summary (Last 24 hours) at 03/11/2022 1137 Last data filed at 03/11/2022 [id number] Gross per 24 hour  Intake --  Output 2100 ml  Net -2100 ml    Labs/Imaging Results for orders placed or performed during the hospital encounter of 03/10/22 (from the past 48 hour(s))  CBC with Differential     Status: Abnormal   Collection Time: 03/10/22 10:58 PM  Result Value Ref Range   WBC 11.2 (H) 4.0 - 10.5 K/uL   RBC 3.86 (L) 4.22 - 5.81 MIL/uL   Hemoglobin 11.2 (L) 13.0 - 17.0 g/dL   HCT 34.8 (L) 39.0 - 52.0 %   MCV 90.2 80.0 - 100.0 fL   MCH 29.0 26.0 - 34.0 pg   MCHC 32.2 30.0 - 36.0 g/dL   RDW 15.0 11.5 - 15.5 %   Platelets 217 150 - 400 K/uL   nRBC 0.0 0.0 - 0.2 %   Neutrophils Relative % 74 %   Neutro Abs 8.3 (H) 1.7 - 7.7 K/uL   Lymphocytes Relative 16 %   Lymphs Abs 1.8 0.7 - 4.0 K/uL   Monocytes Relative 8 %   Monocytes Absolute 0.9 0.1 - 1.0 K/uL  Eosinophils Relative 1 %   Eosinophils Absolute 0.1 0.0 - 0.5 K/uL   Basophils Relative 0 %   Basophils Absolute 0.0 0.0 - 0.1 K/uL   Immature Granulocytes 1 %   Abs Immature Granulocytes 0.06 0.00 - 0.07 K/uL    Comment: Performed at [facility], [place] [address] [place], [place] [address]  Comprehensive metabolic panel     Status: Abnormal   Collection Time: 03/10/22 10:58 PM  Result Value Ref Range   Sodium 134 (L) 135 - 145 mmol/L   Potassium 3.4 (L) 3.5 - 5.1 mmol/L   Chloride 103 98 - 111 mmol/L   CO2 23 22 - 32 mmol/L   Glucose, Bld 398 (H) 70 - 99 mg/dL    Comment: Glucose reference range applies only to samples taken after fasting for at least 8 hours.   BUN 18 8 - 23 mg/dL   Creatinine, Ser 2.73 (H) 0.61 - 1.24 mg/dL   Calcium 9.0 8.9 - 10.3 mg/dL   Total Protein 6.9 6.5 - 8.1 g/dL   Albumin 3.8 3.5 -  5.0 g/dL   AST 24 15 - 41 U/L   ALT 35 0 - 44 U/L   Alkaline Phosphatase 67 38 - 126 U/L   Total Bilirubin 0.6 0.3 - 1.2 mg/dL   GFR, Estimated 25 (L) >60 mL/min    Comment: (NOTE) Calculated using the CKD-EPI Creatinine Equation (2021)    Anion gap 8 5 - 15    Comment: Performed at [facility], [place] [address] [place], [name] [address]  Lipase, blood     Status: None   Collection Time: 03/10/22 10:58 PM  Result Value Ref Range   Lipase 40 11 - 51 U/L    Comment: Performed at [facility], [place] [address] [place], [place] [address]  Urinalysis, Routine w reflex microscopic -Urine, Clean Catch     Status: Abnormal   Collection Time: 03/11/22  2:10 AM  Result Value Ref Range   Color, Urine YELLOW YELLOW   APPearance CLOUDY (A) CLEAR   Specific Gravity, Urine 1.009 1.005 - 1.030   pH 5.0 5.0 - 8.0   Glucose, UA >=500 (A) NEGATIVE mg/dL   Hgb urine dipstick SMALL (A) NEGATIVE   Bilirubin Urine NEGATIVE NEGATIVE   Ketones, ur NEGATIVE NEGATIVE mg/dL   Protein, ur NEGATIVE NEGATIVE mg/dL   Nitrite NEGATIVE NEGATIVE   Leukocytes,Ua LARGE (A) NEGATIVE   RBC / HPF 0-5 0 - 5 RBC/hpf   WBC, UA >50 0 - 5 WBC/hpf   Bacteria, UA RARE (A) NONE SEEN   Squamous Epithelial / HPF 0-5 0 - 5 /HPF   WBC Clumps PRESENT    Mucus PRESENT     Comment: Performed at [facility], [place] [address], [place], [place] [id number]  Basic metabolic panel     Status: Abnormal   Collection Time: 03/11/22  5:37 AM  Result Value Ref Range   Sodium 140 135 - 145 mmol/L   Potassium 3.6 3.5 - 5.1 mmol/L   Chloride 108 98 - 111 mmol/L   CO2 25 22 - 32 mmol/L   Glucose, Bld 236 (H) 70 - 99 mg/dL    Comment: Glucose reference range applies only to samples taken after fasting for at least 8 hours.   BUN 16 8 - 23 mg/dL   Creatinine, Ser 2.39 (H) 0.61 - 1.24 mg/dL   Calcium 8.8 (L) 8.9 - 10.3 mg/dL   GFR, Estimated  30 (L) >60 mL/min    Comment:  (NOTE) Calculated using the CKD-EPI Creatinine Equation (2021)    Anion gap 7 5 - 15    Comment: Performed at [facility], [place] [address] [place], [place] [address]   CT ABDOMEN PELVIS WO CONTRAST  Result Date: 03/11/2022 CLINICAL DATA:  abdominal pain and distention. Pt complains of diffuse abdominal pain. Patient has not had a bowel movement for 7 days. He has tried laxative OTC with no relief. Patient reports increased abdominal distention in the last 7 days. No fever, nausea, vomiting EXAM: CT ABDOMEN AND PELVIS WITHOUT CONTRAST TECHNIQUE: Multidetector CT imaging of the abdomen and pelvis was performed following the standard protocol without IV contrast. RADIATION DOSE REDUCTION: This exam was performed according to the departmental dose-optimization program which includes automated exposure control, adjustment of the mA and/or kV according to patient size and/or use of iterative reconstruction technique. COMPARISON:  CT abdomen pelvis 07/22/2018 FINDINGS: Lower chest: Trace pericardial effusion. Tiny hiatal hernia. No acute abnormality. Hepatobiliary: No focal liver abnormality. No gallstones, gallbladder wall thickening, or pericholecystic fluid. No biliary dilatation. Pancreas: No focal lesion. Normal pancreatic contour. No surrounding inflammatory changes. No main pancreatic ductal dilatation. Spleen: Normal in size without focal abnormality.  Splenule noted. Adrenals/Urinary Tract: No adrenal nodule bilaterally. No nephrolithiasis and no hydronephrosis. Fluid density lesions within the right kidney likely represent simple renal cysts. Simple renal cysts, in the absence of clinically indicated signs/symptoms, require no independent follow-up. No ureterolithiasis or hydroureter. Perivesicular fat stranding. Stomach/Bowel: Stomach is within normal limits. No evidence of bowel wall thickening or dilatation. Status post appendectomy. Vascular/Lymphatic: No abdominal aorta or iliac  aneurysm. Mild atherosclerotic plaque of the aorta and its branches. No abdominal, pelvic, or inguinal lymphadenopathy. Reproductive: Prominent prostate measuring up to 4.7 cm. Other: No intraperitoneal free fluid. No intraperitoneal free gas. No organized fluid collection. Musculoskeletal: Small fat containing umbilical hernia. No suspicious lytic or blastic osseous lesions. No acute displaced fracture. IMPRESSION: 1. Findings suggestive of cystitis.  Correlate with urinalysis. 2. Tiny hiatal hernia. Electronically Signed   By: Iven Finn M.D.   On: 03/11/2022 01:09    Pending Labs Unresulted Labs (From admission, onward)     Start     Ordered   03/12/22 0500  Procalcitonin  Daily,   R      03/11/22 1106   03/12/22 0500  Protime-INR  Tomorrow morning,   R        03/11/22 1119   03/12/22 0500  Cortisol-am, blood  Tomorrow morning,   R        03/11/22 1119   03/12/22 0500  Comprehensive metabolic panel  Daily,   R      03/11/22 1119   03/12/22 0500  Magnesium  Daily,   R      03/11/22 1119   03/12/22 0500  Phosphorus  Daily,   R      03/11/22 1119   03/12/22 0500  CBC with Differential/Platelet  Daily,   R      03/11/22 1119   03/11/22 1117  Lipid panel  Once,   R        03/11/22 1119   03/11/22 1109  CBC  (heparin)  Once,   R       Comments: Baseline for heparin therapy IF NOT ALREADY DRAWN.  Notify MD if PLT < 100 K.    03/11/22 1119   03/11/22 1109  Creatinine, serum  (heparin)  Once,  R       Comments: Baseline for heparin therapy IF NOT ALREADY DRAWN.    03/11/22 1119   03/11/22 1107  Procalcitonin - Baseline  ONCE - URGENT,   URGENT        03/11/22 1106   03/11/22 1106  Urinalysis, w/ Reflex to Culture (Infection Suspected) -Urine, Clean Catch  (Urine Labs)  ONCE - URGENT,   URGENT       Question:  Specimen Source  Answer:  Urine, Clean Catch   03/11/22 1106   03/11/22 1106  Culture, blood (Routine X 2) w Reflex to ID Panel  BLOOD CULTURE X 2,   R (with STAT occurrences)       03/11/22 1106   03/11/22 1106  Lactic acid, plasma  STAT Now then every 3 hours,   R (with STAT occurrences)      03/11/22 1106   Unscheduled  PSA  Once,   R        03/11/22 1137            Vitals/Pain Today's Vitals   03/11/22 0700 03/11/22 0730 03/11/22 0735 03/11/22 0810  BP: 95/82 115/83 115/83   Pulse: 63 65 60   Resp:   14   Temp:   98.3 F (36.8 C)   TempSrc:   Oral   SpO2: 100% 98% 100%   Weight:      Height:      PainSc:    4     Isolation Precautions No active isolations  Medications Medications  heparin injection 5,000 Units (has no administration in time range)  lactated ringers bolus 75 mL (has no administration in time range)  cefTRIAXone (ROCEPHIN) 2 g in sodium chloride 0.9 % 100 mL IVPB (has no administration in time range)  acetaminophen (TYLENOL) tablet 650 mg (has no administration in time range)    Or  acetaminophen (TYLENOL) suppository 650 mg (has no administration in time range)  oxyCODONE (Oxy IR/ROXICODONE) immediate release tablet 5 mg (has no administration in time range)  traZODone (DESYREL) tablet 25 mg (has no administration in time range)  docusate sodium (COLACE) capsule 100 mg (has no administration in time range)  ondansetron (ZOFRAN) tablet 4 mg (has no administration in time range)    Or  ondansetron (ZOFRAN) injection 4 mg (has no administration in time range)  aspirin EC tablet 81 mg (has no administration in time range)  tamsulosin (FLOMAX) capsule 0.4 mg (has no administration in time range)  rosuvastatin (CRESTOR) tablet 20 mg (has no administration in time range)  insulin aspart (novoLOG) injection 0-15 Units (has no administration in time range)  morphine (PF) 4 MG/ML injection 4 mg (4 mg Intravenous Given 03/11/22 0154)  lactated ringers bolus 1,000 mL (0 mLs Intravenous Stopped 03/11/22 0711)  lidocaine (XYLOCAINE) 2 % jelly 1 Application (1 Application Urethral Given 03/11/22 0300)  cefTRIAXone (ROCEPHIN) 1 g in  sodium chloride 0.9 % 100 mL IVPB (0 g Intravenous Stopped 03/11/22 0500)    Mobility walks     Focused Assessments Cardiac Assessment Handoff:    Lab Results  Component Value Date   TROPONINI <0.03 02/21/2018   No results found for: "DDIMER" Does the Patient currently have chest pain? No    R Recommendations: See Admitting Provider Note  Report given to:   Additional Notes:

## 2022-03-11 NOTE — H&P (Signed)
[facility] History and Physical  Zyad Glessner [id number] DOB: 12-25-1955 DOA: 03/10/2022  Referring physician:  PCP: Camillia Herter, NP   Chief Complaint: Constipation.  Acute urinary retention  HPI: Curtis Saunders  67 y.o.BM PMHx DM type II uncontrolled with hyperglycemia, HTN, LVH, HLD, CKD stage III (baseline Cr 1.4), BPH, CVA  Presents emergency department with 1 week of constipation and with 2 days of significantly decreased urine output.  States he feels like he needs to urinate but is only able to pass very small dribbles of urine.  Only urinated once around 5 PM today, scant amount, here unable to provide small amount of urine in the urinal despite lower abdominal distention and tenderness to palpation.      Review of Systems:  Covid vaccination;  Constitutional:  No weight loss, night sweats, Fevers, chills, fatigue.  HEENT:  No headaches, Difficulty swallowing,Tooth/dental problems,Sore throat,  No sneezing, itching, ear ache, nasal congestion, post nasal drip,  Cardio-vascular:  No chest pain, Orthopnea, PND, swelling in lower extremities, anasarca, dizziness, palpitations  GI:  No heartburn, indigestion, abdominal pain, nausea, vomiting, diarrhea, change in bowel habits, loss of appetite  Resp:  No shortness of breath with exertion or at rest. No excess mucus, no productive cough, No non-productive cough, No coughing up of blood.No change in color of mucus.No wheezing.No chest wall deformity  Skin:  no rash or lesions.  GU:  no dysuria, change in color of urine, no urgency or frequency. No flank pain.  Musculoskeletal:  No joint pain or swelling. No decreased range of motion. No back pain.  Psych:  No change in mood or affect. No depression or anxiety. No memory loss.   Past Medical History:  Diagnosis Date   Diabetes mellitus ([place])    Hypertension    Stroke ([place]) 2023   x 2, no deficits   Past Surgical History:  Procedure  Laterality Date   APPENDECTOMY     EYE SURGERY     HAND SURGERY     KNEE SURGERY     x3, x1 R   LIPOMA EXCISION Left 10/13/2019   Procedure: SHOULDER EXCISION LIPOMA AND ROTATOR CUFF REPAIR;  Surgeon: Tania Ade, MD;  Location: [facility];  Service: Orthopedics;  Laterality: Left;   TONSILLECTOMY     Social History:  reports that he has never smoked. He has never been exposed to tobacco smoke. He has never used smokeless tobacco. He reports current alcohol use. He reports that he does not use drugs.  Allergies  Allergen Reactions   Bee Venom Anaphylaxis    Family History  Problem Relation Age of Onset   Heart attack Mother 14   COPD Mother    Emphysema Mother    Diabetes Father    Diabetes Sister    Hypertension Sister    Sleep apnea Neg Hx      Prior to Admission medications   Medication Sig Start Date End Date Taking? Authorizing Provider  amLODipine (NORVASC) 10 MG tablet Take 1 tablet by mouth once daily 09/12/21  Yes Minus Breeding, MD  aspirin EC 81 MG EC tablet Take 1 tablet (81 mg total) by mouth daily. Swallow whole. 02/28/20  Yes Barb Merino, MD  insulin detemir (LEVEMIR) 100 UNIT/ML FlexPen Inject 20 Units into the skin 2 (two) times daily. 03/02/22  Yes Samella Parr, NP  ramipril (ALTACE) 10 MG capsule Take 1 capsule (10 mg total) by mouth daily. [place] WITH YOUR DOCTOR  03/02/22  Yes Rai, Ripudeep K, MD  rosuvastatin (CRESTOR) 20 MG tablet Take 1 tablet by mouth once daily 02/27/22  Yes McCue, [name], NP  tamsulosin (FLOMAX) 0.4 MG CAPS capsule Take 1 capsule (0.4 mg total) by mouth daily. 03/02/22  Yes Rai, Vernelle Emerald, MD  Accu-Chek FastClix Lancets MISC Apply topically. 03/07/22   [provider]  blood glucose meter kit and supplies Dispense based on patient and insurance preference. Use up to four times daily as directed. (FOR ICD-10 E10.9, E11.9). 03/02/22   Rai, Ripudeep K, MD  Blood Glucose Monitoring Suppl (ACCU-CHEK  AVIVA PLUS) w/Device KIT 1 each by Does not apply route 4 (four) times daily -  before meals and at bedtime. 03/07/22   Camillia Herter, NP  glucose blood (ACCU-CHEK GUIDE) test strip Use as instructed 03/07/22   Camillia Herter, NP  Insulin Pen Needle (PEN NEEDLES 3/16") 31G X 5 MM MISC 20 Units by Does not apply route 2 (two) times daily. 03/02/22   Rai, Vernelle Emerald, MD  Lancets Misc. (ACCU-CHEK FASTCLIX LANCET) KIT 1 each by Does not apply route 4 (four) times daily -  before meals and at bedtime. 03/07/22   Camillia Herter, NP     Consultants:    Procedures/Significant Events:  2/17 CT abdomen pelvis W0 contrast Findings suggestive of cystitis.  Correlate with urinalysis. 2. Tiny hiatal hernia.  I have personally reviewed and interpreted all radiology studies and my findings are as above.   VENTILATOR SETTINGS:    Cultures   Antimicrobials: Anti-infectives (From admission, onward)    Start     Dose/Rate Route Frequency Ordered Stop   03/12/22 0800  cefTRIAXone (ROCEPHIN) 2 g in sodium chloride 0.9 % 100 mL IVPB        2 g 200 mL/hr over 30 Minutes Intravenous Every 24 hours 03/11/22 1119 03/19/22 0759   03/11/22 1145  cefTRIAXone (ROCEPHIN) 1 g in sodium chloride 0.9 % 100 mL IVPB        1 g 200 mL/hr over 30 Minutes Intravenous NOW 03/11/22 1139 03/12/22 1145   03/11/22 0330  cefTRIAXone (ROCEPHIN) 1 g in sodium chloride 0.9 % 100 mL IVPB        1 g 200 mL/hr over 30 Minutes Intravenous  Once 03/11/22 0318 03/11/22 0500         Devices    LINES / TUBES:      Continuous Infusions:  Physical Exam: Vitals:   03/11/22 0630 03/11/22 0700 03/11/22 0730 03/11/22 0735  BP: 123/83 95/82 115/83 115/83  Pulse: 62 63 65 60  Resp:    14  Temp:    98.3 F (36.8 C)  TempSrc:    Oral  SpO2: 97% 100% 98% 100%  Weight:      Height:        Wt Readings from Last 3 Encounters:  03/11/22 95 kg  03/07/22 95.3 kg  02/28/22 96 kg    General: A/O x 4, No acute  respiratory distress Eyes: negative scleral hemorrhage, negative anisocoria, negative icterus ENT: Negative Runny nose, negative gingival bleeding, Neck:  Negative scars, masses, torticollis, lymphadenopathy, JVD Lungs: Clear to auscultation bilaterally without wheezes or crackles Cardiovascular: Regular rate and rhythm without murmur gallop or rub normal S1 and S2 Abdomen: negative abdominal pain, nondistended, positive soft, bowel sounds, no rebound, no ascites, no appreciable mass Extremities: No significant cyanosis, clubbing, or edema bilateral lower extremities Skin: Negative rashes, lesions, ulcers Psychiatric:  Negative depression, negative anxiety,  negative fatigue, negative mania  Central nervous system:  Cranial nerves II through XII intact, tongue/uvula midline, all extremities muscle strength 5/5, sensation intact throughout, negative dysarthria, negative expressive aphasia, negative receptive aphasia.        Labs on Admission:  Basic Metabolic Panel: Recent Labs  Lab 03/07/22 1104 03/10/22 2258 03/11/22 0537  NA 142 134* 140  K 3.6 3.4* 3.6  CL 106 103 108  CO2 18* 23 25  GLUCOSE 130* 398* 236*  BUN 15 18 16  $ CREATININE 1.34* 2.73* 2.39*  CALCIUM 9.8 9.0 8.8*   Liver Function Tests: Recent Labs  Lab 03/10/22 2258  AST 24  ALT 35  ALKPHOS 67  BILITOT 0.6  PROT 6.9  ALBUMIN 3.8   Recent Labs  Lab 03/10/22 2258  LIPASE 40   No results for input(s): "AMMONIA" in the last 168 hours. CBC: Recent Labs  Lab 03/10/22 2258  WBC 11.2*  NEUTROABS 8.3*  HGB 11.2*  HCT 34.8*  MCV 90.2  PLT 217   Cardiac Enzymes: No results for input(s): "CKTOTAL", "CKMB", "CKMBINDEX", "TROPONINI" in the last 168 hours.  BNP (last 3 results) No results for input(s): "BNP" in the last 8760 hours.  ProBNP (last 3 results) No results for input(s): "PROBNP" in the last 8760 hours.  CBG: No results for input(s): "GLUCAP" in the last 168 hours.  Radiological Exams on  Admission: CT ABDOMEN PELVIS WO CONTRAST  Result Date: 03/11/2022 CLINICAL DATA:  abdominal pain and distention. Pt complains of diffuse abdominal pain. Patient has not had a bowel movement for 7 days. He has tried laxative OTC with no relief. Patient reports increased abdominal distention in the last 7 days. No fever, nausea, vomiting EXAM: CT ABDOMEN AND PELVIS WITHOUT CONTRAST TECHNIQUE: Multidetector CT imaging of the abdomen and pelvis was performed following the standard protocol without IV contrast. RADIATION DOSE REDUCTION: This exam was performed according to the departmental dose-optimization program which includes automated exposure control, adjustment of the mA and/or kV according to patient size and/or use of iterative reconstruction technique. COMPARISON:  CT abdomen pelvis 07/22/2018 FINDINGS: Lower chest: Trace pericardial effusion. Tiny hiatal hernia. No acute abnormality. Hepatobiliary: No focal liver abnormality. No gallstones, gallbladder wall thickening, or pericholecystic fluid. No biliary dilatation. Pancreas: No focal lesion. Normal pancreatic contour. No surrounding inflammatory changes. No main pancreatic ductal dilatation. Spleen: Normal in size without focal abnormality.  Splenule noted. Adrenals/Urinary Tract: No adrenal nodule bilaterally. No nephrolithiasis and no hydronephrosis. Fluid density lesions within the right kidney likely represent simple renal cysts. Simple renal cysts, in the absence of clinically indicated signs/symptoms, require no independent follow-up. No ureterolithiasis or hydroureter. Perivesicular fat stranding. Stomach/Bowel: Stomach is within normal limits. No evidence of bowel wall thickening or dilatation. Status post appendectomy. Vascular/Lymphatic: No abdominal aorta or iliac aneurysm. Mild atherosclerotic plaque of the aorta and its branches. No abdominal, pelvic, or inguinal lymphadenopathy. Reproductive: Prominent prostate measuring up to 4.7 cm. Other:  No intraperitoneal free fluid. No intraperitoneal free gas. No organized fluid collection. Musculoskeletal: Small fat containing umbilical hernia. No suspicious lytic or blastic osseous lesions. No acute displaced fracture. IMPRESSION: 1. Findings suggestive of cystitis.  Correlate with urinalysis. 2. Tiny hiatal hernia. Electronically Signed   By: Iven Finn M.D.   On: 03/11/2022 01:09    EKG: Independently reviewed.   Assessment/Plan Active Problems:   LVH (left ventricular hypertrophy)   Ascending aortic aneurysm (H[facility])   CVA (cerebral vascular accident) ([place])   Stage 3 chronic kidney disease ([place])  BPH (benign prostatic hyperplasia)   Benign essential HTN   Uncontrolled type 2 diabetes mellitus with hyperglycemia, with long-term current use of insulin (H[facility])   Acute urinary retention   Obesity (BMI 30-39.9)  Sepsis - On admission patient meets criteria for sepsis HR> 90, RR> 20, WBC> 12 K.  Most likely site of infection is urinary tract -Influenza A/B/COVID pending   Acute urinary retention/BPH -Please Increase Flomax 0.4 mg BID -PSA pending  CKD stage III (baseline Cr ~1.5) Lab Results  Component Value Date   CREATININE 2.39 (H) 03/11/2022   CREATININE 2.73 (H) 03/10/2022   CREATININE 1.34 (H) 03/07/2022   CREATININE 1.58 (H) 03/02/2022   CREATININE 1.47 (H) 03/01/2022    Essential HTN -Patient's BP currently controlled hold all BP meds patient  Ascending aortic aneurysm -SBP goal<120  DM type II uncontrolled with hyperglycemia -2/6 Hemoglobin A1c= 14.4 -Moderate SSI  HLD - 2/16 lipid panel pending - Crestor 20 mg daily   Obesity (BMI 30.05 kg/m) -Patient to discuss with PCP    Mobility Assessment (last 72 hours)     Mobility Assessment   No documentation.           Code Status: Full (DVT Prophylaxis: Subcu heparin Family Communication:   Status is: Inpatient    Dispo: The patient is from: Home              Anticipated d/c is to:  Home              Anticipated d/c date is: 2 days              Patient currently is not medically stable to d/c.     Data Reviewed: Care during the described time interval was provided by me .  I have reviewed this patient's available data, including medical history, events of note, physical examination, and all test results as part of my evaluation.   The patient is critically ill with multiple organ systems failure and requires high complexity decision making for assessment and support, frequent evaluation and titration of therapies, application of advanced monitoring technologies and extensive interpretation of multiple databases. Critical Care Time devoted to patient care services described in this note  Time spent: 70 minutes   Curtis Saunders, [facility]

## 2022-03-11 NOTE — ED Provider Notes (Signed)
Care transferred from [facility], PA-C at time of sign out. See their note for full assessment.   Briefly: Patient is 67 y.o. male with history of BPH, HTN, CKD Stage III (baseline creatinine at 1.4) who presents to the ED with concerns of urinary retention.     Plan: Plan per previous PA-C: if not improving BMP, admit, if improved then follow up outpatient with urology.  Labs Reviewed  CBC WITH DIFFERENTIAL/PLATELET - Abnormal; Notable for the following components:      Result Value   WBC 11.2 (*)    RBC 3.86 (*)    Hemoglobin 11.2 (*)    HCT 34.8 (*)    Neutro Abs 8.3 (*)    All other components within normal limits  COMPREHENSIVE METABOLIC PANEL - Abnormal; Notable for the following components:   Sodium 134 (*)    Potassium 3.4 (*)    Glucose, Bld 398 (*)    Creatinine, Ser 2.73 (*)    GFR, Estimated 25 (*)    All other components within normal limits  URINALYSIS, ROUTINE W REFLEX MICROSCOPIC - Abnormal; Notable for the following components:   APPearance CLOUDY (*)    Glucose, UA >=500 (*)    Hgb urine dipstick SMALL (*)    Leukocytes,Ua LARGE (*)    Bacteria, UA RARE (*)    All other components within normal limits  BASIC METABOLIC PANEL - Abnormal; Notable for the following components:   Glucose, Bld 236 (*)    Creatinine, Ser 2.39 (*)    Calcium 8.8 (*)    GFR, Estimated 30 (*)    All other components within normal limits  CULTURE, BLOOD (ROUTINE X 2)  CULTURE, BLOOD (ROUTINE X 2)  LIPASE, BLOOD  URINALYSIS, W/ REFLEX TO CULTURE (INFECTION SUSPECTED)  LACTIC ACID, PLASMA  LACTIC ACID, PLASMA  PROCALCITONIN  CBC  CREATININE, SERUM  LIPID PANEL    Clinical Course as of 03/11/22 1132  Sat Mar 11, 2022  0241 Bladder scan with 800 cc. Patietn appears to be retaining urine, will order foley placement and admit for urinary retention with AKI.  [RS]  0311 Consult to Dr. Hal Hope, hospitalist, who is requesting repeat metabolic panel at 6 AM to reevaluate for  creatinine after administration of IV fluid resuscitation.  If creatinine not improving at that time recommends repeat consultation to hospital medicine service, however if improving he recommends outpatient follow-up with urology.  I appreciate his collaboration in the care of this patient. [RS]  [id number] Pt evaluated and resting comfortably on stretcher.  Discussed with patient plans for admission.  Patient agreeable at this time. [SB]  1008 Consult with Hospitalist, Dr. Sherral Hammers who will evaluate for admission.  [SB]    Clinical Course User Index [RS] Sponseller, Gypsy Balsam, PA-C [SB] Micah Galeno A, PA-C     Presentation suspicious for urinary retention, UTI, AKI.  Discussed with patient plans for admission.  Answered all available questions.  Patient appears safe for admission at this time.   This chart was dictated using voice recognition software, Dragon. Despite the best efforts of this provider to proofread and correct errors, errors may still occur which can change documentation meaning.   Ruairi Stutsman A, PA-C 03/11/22 1132    Charlesetta Shanks, MD 03/15/22 [phone]

## 2022-03-12 DIAGNOSIS — E1165 Type 2 diabetes mellitus with hyperglycemia: Secondary | ICD-10-CM | POA: Diagnosis present

## 2022-03-12 DIAGNOSIS — N1831 Chronic kidney disease, stage 3a: Secondary | ICD-10-CM | POA: Diagnosis present

## 2022-03-12 DIAGNOSIS — Z7982 Long term (current) use of aspirin: Secondary | ICD-10-CM | POA: Diagnosis not present

## 2022-03-12 DIAGNOSIS — E1122 Type 2 diabetes mellitus with diabetic chronic kidney disease: Secondary | ICD-10-CM | POA: Diagnosis present

## 2022-03-12 DIAGNOSIS — I517 Cardiomegaly: Secondary | ICD-10-CM | POA: Diagnosis present

## 2022-03-12 DIAGNOSIS — Z833 Family history of diabetes mellitus: Secondary | ICD-10-CM | POA: Diagnosis not present

## 2022-03-12 DIAGNOSIS — N401 Enlarged prostate with lower urinary tract symptoms: Secondary | ICD-10-CM | POA: Diagnosis not present

## 2022-03-12 DIAGNOSIS — I6789 Other cerebrovascular disease: Secondary | ICD-10-CM | POA: Diagnosis present

## 2022-03-12 DIAGNOSIS — N179 Acute kidney failure, unspecified: Secondary | ICD-10-CM | POA: Diagnosis not present

## 2022-03-12 DIAGNOSIS — N400 Enlarged prostate without lower urinary tract symptoms: Secondary | ICD-10-CM | POA: Diagnosis not present

## 2022-03-12 DIAGNOSIS — Z91030 Bee allergy status: Secondary | ICD-10-CM | POA: Diagnosis not present

## 2022-03-12 DIAGNOSIS — R338 Other retention of urine: Secondary | ICD-10-CM | POA: Diagnosis present

## 2022-03-12 DIAGNOSIS — I63319 Cerebral infarction due to thrombosis of unspecified middle cerebral artery: Secondary | ICD-10-CM | POA: Diagnosis not present

## 2022-03-12 DIAGNOSIS — E2749 Other adrenocortical insufficiency: Secondary | ICD-10-CM | POA: Diagnosis present

## 2022-03-12 DIAGNOSIS — E785 Hyperlipidemia, unspecified: Secondary | ICD-10-CM | POA: Diagnosis present

## 2022-03-12 DIAGNOSIS — Z87892 Personal history of anaphylaxis: Secondary | ICD-10-CM | POA: Diagnosis not present

## 2022-03-12 DIAGNOSIS — I7121 Aneurysm of the ascending aorta, without rupture: Secondary | ICD-10-CM | POA: Diagnosis not present

## 2022-03-12 DIAGNOSIS — Z79899 Other long term (current) drug therapy: Secondary | ICD-10-CM | POA: Diagnosis not present

## 2022-03-12 DIAGNOSIS — E876 Hypokalemia: Secondary | ICD-10-CM | POA: Diagnosis present

## 2022-03-12 DIAGNOSIS — I129 Hypertensive chronic kidney disease with stage 1 through stage 4 chronic kidney disease, or unspecified chronic kidney disease: Secondary | ICD-10-CM | POA: Diagnosis present

## 2022-03-12 DIAGNOSIS — K5900 Constipation, unspecified: Secondary | ICD-10-CM | POA: Diagnosis present

## 2022-03-12 DIAGNOSIS — Z8673 Personal history of transient ischemic attack (TIA), and cerebral infarction without residual deficits: Secondary | ICD-10-CM | POA: Diagnosis not present

## 2022-03-12 DIAGNOSIS — E669 Obesity, unspecified: Secondary | ICD-10-CM | POA: Diagnosis present

## 2022-03-12 DIAGNOSIS — Z794 Long term (current) use of insulin: Secondary | ICD-10-CM | POA: Diagnosis not present

## 2022-03-12 DIAGNOSIS — R339 Retention of urine, unspecified: Secondary | ICD-10-CM | POA: Diagnosis not present

## 2022-03-12 DIAGNOSIS — Z1152 Encounter for screening for COVID-19: Secondary | ICD-10-CM | POA: Diagnosis not present

## 2022-03-12 DIAGNOSIS — Z6830 Body mass index (BMI) 30.0-30.9, adult: Secondary | ICD-10-CM | POA: Diagnosis not present

## 2022-03-12 DIAGNOSIS — Z8249 Family history of ischemic heart disease and other diseases of the circulatory system: Secondary | ICD-10-CM | POA: Diagnosis not present

## 2022-03-12 LAB — URINALYSIS, W/ REFLEX TO CULTURE (INFECTION SUSPECTED)
Bilirubin Urine: NEGATIVE
Glucose, UA: >=500 mg/dL — AB
Ketones, ur: 5 mg/dL — AB
Nitrite: NEGATIVE
Protein, ur: 100 mg/dL — AB
RBC / HPF: >50 RBC/hpf (ref 0–5)
Specific Gravity, Urine: 1.01 (ref 1.005–1.030)
WBC, UA: >50 WBC/hpf (ref 0–5)
pH: 5 (ref 5.0–8.0)

## 2022-03-12 LAB — MAGNESIUM: Magnesium: 2.1 mg/dL (ref 1.7–2.4)

## 2022-03-12 LAB — CBC WITH DIFFERENTIAL/PLATELET
Abs Immature Granulocytes: 0.04 10*3/uL (ref 0.00–0.07)
Basophils Absolute: 0 10*3/uL (ref 0.0–0.1)
Basophils Relative: 0 %
Eosinophils Absolute: 0.1 10*3/uL (ref 0.0–0.5)
Eosinophils Relative: 2 %
HCT: 29.6 % — ABNORMAL LOW (ref 39.0–52.0)
Hemoglobin: 9.3 g/dL — ABNORMAL LOW (ref 13.0–17.0)
Immature Granulocytes: 1 %
Lymphocytes Relative: 34 %
Lymphs Abs: 2.8 10*3/uL (ref 0.7–4.0)
MCH: 29.1 pg (ref 26.0–34.0)
MCHC: 31.4 g/dL (ref 30.0–36.0)
MCV: 92.5 fL (ref 80.0–100.0)
Monocytes Absolute: 0.5 10*3/uL (ref 0.1–1.0)
Monocytes Relative: 6 %
Neutro Abs: 4.7 10*3/uL (ref 1.7–7.7)
Neutrophils Relative %: 57 %
Platelets: 176 10*3/uL (ref 150–400)
RBC: 3.2 MIL/uL — ABNORMAL LOW (ref 4.22–5.81)
RDW: 15 % (ref 11.5–15.5)
WBC: 8.1 10*3/uL (ref 4.0–10.5)
nRBC: 0 % (ref 0.0–0.2)

## 2022-03-12 LAB — RESPIRATORY PANEL BY PCR

## 2022-03-12 LAB — PROTIME-INR
INR: 1 (ref 0.8–1.2)
Prothrombin Time: 12.8 seconds (ref 11.4–15.2)

## 2022-03-12 LAB — COMPREHENSIVE METABOLIC PANEL
ALT: 27 U/L (ref 0–44)
AST: 20 U/L (ref 15–41)
Albumin: 2.6 g/dL — ABNORMAL LOW (ref 3.5–5.0)
Alkaline Phosphatase: 50 U/L (ref 38–126)
Anion gap: 9 (ref 5–15)
BUN: 19 mg/dL (ref 8–23)
CO2: 25 mmol/L (ref 22–32)
Calcium: 9.1 mg/dL (ref 8.9–10.3)
Chloride: 108 mmol/L (ref 98–111)
Creatinine, Ser: 1.81 mg/dL — ABNORMAL HIGH (ref 0.61–1.24)
GFR, Estimated: 41 mL/min — ABNORMAL LOW (ref >60)
Glucose, Bld: 177 mg/dL — ABNORMAL HIGH (ref 70–99)
Potassium: 3.3 mmol/L — ABNORMAL LOW (ref 3.5–5.1)
Sodium: 142 mmol/L (ref 135–145)
Total Bilirubin: 0.4 mg/dL (ref 0.3–1.2)
Total Protein: 5.4 g/dL — ABNORMAL LOW (ref 6.5–8.1)

## 2022-03-12 LAB — GLUCOSE, CAPILLARY
Glucose-Capillary: 101 mg/dL — ABNORMAL HIGH (ref 70–99)
Glucose-Capillary: 194 mg/dL — ABNORMAL HIGH (ref 70–99)
Glucose-Capillary: 200 mg/dL — ABNORMAL HIGH (ref 70–99)
Glucose-Capillary: 234 mg/dL — ABNORMAL HIGH (ref 70–99)
Glucose-Capillary: 268 mg/dL — ABNORMAL HIGH (ref 70–99)
Glucose-Capillary: 337 mg/dL — ABNORMAL HIGH (ref 70–99)
Glucose-Capillary: 370 mg/dL — ABNORMAL HIGH (ref 70–99)

## 2022-03-12 LAB — PROCALCITONIN: Procalcitonin: <0.1 ng/mL

## 2022-03-12 LAB — PHOSPHORUS: Phosphorus: 3.7 mg/dL (ref 2.5–4.6)

## 2022-03-12 LAB — CORTISOL-AM, BLOOD: Cortisol - AM: 2.8 ug/dL — ABNORMAL LOW (ref 6.7–22.6)

## 2022-03-12 MED ORDER — CHLORHEXIDINE GLUCONATE CLOTH 2 % EX PADS
6.0000 | MEDICATED_PAD | Freq: Every day | CUTANEOUS | Status: DC
  Administered 2022-03-12 – 2022-03-16 (×5): 6 via TOPICAL

## 2022-03-12 MED ORDER — TADALAFIL 5 MG PO TABS
5.0000 mg | ORAL_TABLET | Freq: Every day | ORAL | Status: DC
  Administered 2022-03-12 – 2022-03-16 (×5): 5 mg via ORAL
  Filled 2022-03-12 (×6): qty 1

## 2022-03-12 NOTE — Progress Notes (Signed)
PROGRESS NOTE    Curtis Saunders  [id number] DOB: 05-30-1955 DOA: 03/10/2022 PCP: Camillia Herter, NP     Brief Narrative:   Curtis Saunders  67 y.o.BM PMHx DM type II uncontrolled with hyperglycemia, HTN, LVH, HLD, CKD stage III (baseline Cr 1.4), BPH, CVA   Presents emergency department with 1 week of constipation and with 2 days of significantly decreased urine output.  States he feels like he needs to urinate but is only able to pass very small dribbles of urine.  Only urinated once around 5 PM today, scant amount, here unable to provide small amount of urine in the urinal despite lower abdominal distention and tenderness to palpation.     Subjective: 2/18 A/O x 4, negative nausea, negative vomiting, negative abdominal pain.  States able to void through the catheter.   Assessment & Plan: Covid vaccination;   Principal Problem:   Acute retention of urine Active Problems:   LVH (left ventricular hypertrophy)   Ascending aortic aneurysm (HCC)   CVA (cerebral vascular accident) ([place])   Stage 3 chronic kidney disease (HCC)   BPH (benign prostatic hyperplasia)   Benign essential HTN   Uncontrolled type 2 diabetes mellitus with hyperglycemia, with long-term current use of insulin ([place])   Acute urinary retention   Obesity (BMI 30-39.9)  Sepsis - On admission patient meets criteria for sepsis HR> 90, RR> 20, WBC> 12 K.  Most likely site of infection is urinary tract -Influenza A/B/COVID negative   Acute urinary retention/BPH - 2/16 Inrease Flomax 0.4 mg BID -2/17 PSA =3.66.  Normal -2/17 patient has normal PSA will add Tadalafil 5 mg daily - 2/17 follow-up with urologist upon discharge if symptoms persist.   CKD stage III (baseline Cr ~1.5) Lab Results  Component Value Date   CREATININE 1.81 (H) 03/12/2022   CREATININE 2.39 (H) 03/11/2022   CREATININE 2.73 (H) 03/10/2022   CREATININE 1.34 (H) 03/07/2022   CREATININE 1.58 (H) 03/02/2022  -2/18  improving with hydration   Essential HTN -Patient's BP currently controlled hold all BP meds patient   Ascending aortic aneurysm -SBP goal<120 -2/18 BP at goal   DM type II uncontrolled with hyperglycemia -2/6 Hemoglobin A1c= 14.4 -2/18 DM coordinator: Uncontrolled diabetic requires education -2/18 DM nutrition: Uncontrolled diabetic requires education CBG (last 3)  Recent Labs    03/12/22 0805 03/12/22 1219 03/12/22 1643  GLUCAP 101* 194* 234*  -Moderate SSI   HLD - 2/17 lipid panel: LDL= 49 at goal  - Crestor 20 mg daily  Hypokalemia - Potassium goal> 4 - 2/18 Potassium IV 60 mEq  Adrenal insufficiency? -2/18 A.m. cortisol= 2.8 (low) -2/18 ACTH pending   obesity (BMI 30.05 kg/m) -Patient to discuss with PCP          Mobility Assessment (last 72 hours)     Mobility Assessment     Row Name 03/11/22 1500           Does patient have an order for bedrest or is patient medically unstable No - Continue assessment       What is the highest level of mobility based on the progressive mobility assessment? Level 5 (Walks with assist in room/hall) - Balance while stepping forward/back and can walk in room with assist - Complete                      DVT prophylaxis: Subcu heparin Code Status: Full Family Communication:  Status is: Inpatient    Dispo: The  patient is from: Home              Anticipated d/c is to: Home              Anticipated d/c date is: 3 days              Patient currently is not medically stable to d/c.      Consultants:    Procedures/Significant Events:  2/17 CT abdomen pelvis W0 contrast Findings suggestive of cystitis.  Correlate with urinalysis. 2. Tiny hiatal hernia.  I have personally reviewed and interpreted all radiology studies and my findings are as above.  VENTILATOR SETTINGS:    Cultures 2/17 influenza A/B negative 2/17 SARS coronavirus negative 2/17 respiratory virus panel negative 2/17 blood LEFT  hand NGTD   Antimicrobials: Anti-infectives (From admission, onward)    Start     Ordered Stop   03/12/22 0800  cefTRIAXone (ROCEPHIN) 2 g in sodium chloride 0.9 % 100 mL IVPB        03/11/22 1119 03/19/22 0759   03/11/22 1145  cefTRIAXone (ROCEPHIN) 1 g in sodium chloride 0.9 % 100 mL IVPB        03/11/22 1139 03/12/22 0526   03/11/22 0330  cefTRIAXone (ROCEPHIN) 1 g in sodium chloride 0.9 % 100 mL IVPB        03/11/22 0318 03/11/22 0500         Devices    LINES / TUBES:      Continuous Infusions:  cefTRIAXone (ROCEPHIN)  IV       Objective: Vitals:   03/11/22 2031 03/12/22 0019 03/12/22 0202 03/12/22 0623  BP: 117/74 112/64 111/74 121/82  Pulse: 71 70 71 69  Resp: 16 18 18 18  $ Temp: 98.3 F (36.8 C) 98.6 F (37 C) 98.9 F (37.2 C) 98.5 F (36.9 C)  TempSrc: Oral Oral Oral Oral  SpO2: 97% 96% 96% 100%  Weight:      Height:        Intake/Output Summary (Last 24 hours) at 03/12/2022 [id number] Last data filed at 03/12/2022 0600 Gross per 24 hour  Intake 1035 ml  Output 1650 ml  Net -615 ml   Filed Weights   03/11/22 0231  Weight: 95 kg    Examination:  General: A/O x 4, No acute respiratory distress Eyes: negative scleral hemorrhage, negative anisocoria, negative icterus ENT: Negative Runny nose, negative gingival bleeding, Neck:  Negative scars, masses, torticollis, lymphadenopathy, JVD Lungs: Clear to auscultation bilaterally without wheezes or crackles Cardiovascular: Regular rate and rhythm without murmur gallop or rub normal S1 and S2 Abdomen: negative abdominal pain, nondistended, positive soft, bowel sounds, no rebound, no ascites, no appreciable mass Extremities: No significant cyanosis, clubbing, or edema bilateral lower extremities Skin: Negative rashes, lesions, ulcers Psychiatric:  Negative depression, negative anxiety, negative fatigue, negative mania  Central nervous system:  Cranial nerves II through XII intact, tongue/uvula midline, all  extremities muscle strength 5/5, sensation intact throughout, negative dysarthria, negative expressive aphasia, negative receptive aphasia.  .     Data Reviewed: Care during the described time interval was provided by me .  I have reviewed this patient's available data, including medical history, events of note, physical examination, and all test results as part of my evaluation.  CBC: Recent Labs  Lab 03/10/22 2258 03/12/22 0407  WBC 11.2* 8.1  NEUTROABS 8.3* 4.7  HGB 11.2* 9.3*  HCT 34.8* 29.6*  MCV 90.2 92.5  PLT 217 0000000   Basic Metabolic Panel: Recent Labs  Lab 03/07/22 1104 03/10/22 2258 03/11/22 0537 03/12/22 0407  NA 142 134* 140 142  K 3.6 3.4* 3.6 3.3*  CL 106 103 108 108  CO2 18* 23 25 25  $ GLUCOSE 130* 398* 236* 177*  BUN 15 18 16 19  $ CREATININE 1.34* 2.73* 2.39* 1.81*  CALCIUM 9.8 9.0 8.8* 9.1  MG  --   --   --  2.1  PHOS  --   --   --  3.7   GFR: Estimated Creatinine Clearance: 47.7 mL/min (A) (by C-G formula based on SCr of 1.81 mg/dL (H)). Liver Function Tests: Recent Labs  Lab 03/10/22 2258 03/12/22 0407  AST 24 20  ALT 35 27  ALKPHOS 67 50  BILITOT 0.6 0.4  PROT 6.9 5.4*  ALBUMIN 3.8 2.6*   Recent Labs  Lab 03/10/22 2258  LIPASE 40   No results for input(s): "AMMONIA" in the last 168 hours. Coagulation Profile: Recent Labs  Lab 03/12/22 0407  INR 1.0   Cardiac Enzymes: No results for input(s): "CKTOTAL", "CKMB", "CKMBINDEX", "TROPONINI" in the last 168 hours. BNP (last 3 results) No results for input(s): "PROBNP" in the last 8760 hours. HbA1C: No results for input(s): "HGBA1C" in the last 72 hours. CBG: Recent Labs  Lab 03/11/22 1630 03/11/22 2034 03/12/22 0016 03/12/22 0349 03/12/22 0805  GLUCAP 191* 290* 268* 200* 101*   Lipid Profile: Recent Labs    03/11/22 1340  CHOL 111  HDL 52  LDLCALC 49  TRIG 50  CHOLHDL 2.1   Thyroid Function Tests: No results for input(s): "TSH", "T4TOTAL", "FREET4", "T3FREE",  "THYROIDAB" in the last 72 hours. Anemia Panel: No results for input(s): "VITAMINB12", "FOLATE", "FERRITIN", "TIBC", "IRON", "RETICCTPCT" in the last 72 hours. Sepsis Labs: Recent Labs  Lab 03/11/22 1340 03/11/22 1625 03/12/22 0407  PROCALCITON 0.10  --  <0.10  LATICACIDVEN 0.9 0.9  --     Recent Results (from the past 240 hour(s))  Culture, blood (Routine X 2) w Reflex to ID Panel     Status: None (Preliminary result)   Collection Time: 03/11/22  1:40 PM   Specimen: BLOOD LEFT HAND  Result Value Ref Range Status   Specimen Description   Final    BLOOD LEFT HAND Performed at [facility], [place] [address] [place], [place] [address]    Special Requests   Final    BOTTLES DRAWN AEROBIC AND ANAEROBIC Blood Culture results may not be optimal due to an inadequate volume of blood received in culture bottles Performed at [facility] [address], [place], [place] [address]    Culture PENDING  Incomplete   Report Status PENDING  Incomplete  Culture, blood (Routine X 2) w Reflex to ID Panel     Status: None (Preliminary result)   Collection Time: 03/11/22  1:40 PM   Specimen: BLOOD LEFT ARM  Result Value Ref Range Status   Specimen Description   Final    BLOOD LEFT ARM Performed at [facility], [place] [address], [place], [place] [address]    Special Requests   Final    BOTTLES DRAWN AEROBIC AND ANAEROBIC Blood Culture adequate volume Performed at [facility] [address] [place], [place] [address]    Culture PENDING  Incomplete   Report Status PENDING  Incomplete  Resp panel by RT-PCR (RSV, Flu A&B, Covid) Anterior Nasal Swab     Status: None   Collection Time: 03/11/22  5:28 PM   Specimen: Anterior  Nasal Swab  Result Value Ref Range Status   SARS Coronavirus 2 by RT PCR NEGATIVE NEGATIVE Final    Comment: (NOTE) SARS-CoV-2 target nucleic acids are NOT DETECTED.  The SARS-CoV-2 RNA is generally detectable in upper  respiratory specimens during the acute phase of infection. The lowest concentration of SARS-CoV-2 viral copies this assay can detect is 138 copies/mL. A negative result does not preclude SARS-Cov-2 infection and should not be used as the sole basis for treatment or other patient management decisions. A negative result may occur with  improper specimen collection/handling, submission of specimen other than nasopharyngeal swab, presence of viral mutation(s) within the areas targeted by this assay, and inadequate number of viral copies(<138 copies/mL). A negative result must be combined with clinical observations, patient history, and epidemiological information. The expected result is Negative.  Fact Sheet for Patients:  EntrepreneurPulse.com.au  Fact Sheet for Healthcare Providers:  IncredibleEmployment.be  This test is no t yet approved or cleared by the [place] FDA and  has been authorized for detection and/or diagnosis of SARS-CoV-2 by FDA under an Emergency Use Authorization (EUA). This EUA will remain  in effect (meaning this test can be used) for the duration of the COVID-19 declaration under Section 564(b)(1) of the Act, 21 U.S.C.section 360bbb-3(b)(1), unless the authorization is terminated  or revoked sooner.       Influenza A by PCR NEGATIVE NEGATIVE Final   Influenza B by PCR NEGATIVE NEGATIVE Final    Comment: (NOTE) The Xpert Xpress SARS-CoV-2/FLU/RSV plus assay is intended as an aid in the diagnosis of influenza from Nasopharyngeal swab specimens and should not be used as a sole basis for treatment. Nasal washings and aspirates are unacceptable for Xpert Xpress SARS-CoV-2/FLU/RSV testing.  Fact Sheet for Patients: EntrepreneurPulse.com.au  Fact Sheet for Healthcare Providers: IncredibleEmployment.be  This test is not yet approved or cleared by the [place] FDA and has been  authorized for detection and/or diagnosis of SARS-CoV-2 by FDA under an Emergency Use Authorization (EUA). This EUA will remain in effect (meaning this test can be used) for the duration of the COVID-19 declaration under Section 564(b)(1) of the Act, 21 U.S.C. section 360bbb-3(b)(1), unless the authorization is terminated or revoked.     Resp Syncytial Virus by PCR NEGATIVE NEGATIVE Final    Comment: (NOTE) Fact Sheet for Patients: EntrepreneurPulse.com.au  Fact Sheet for Healthcare Providers: IncredibleEmployment.be  This test is not yet approved or cleared by the [place] FDA and has been authorized for detection and/or diagnosis of SARS-CoV-2 by FDA under an Emergency Use Authorization (EUA). This EUA will remain in effect (meaning this test can be used) for the duration of the COVID-19 declaration under Section 564(b)(1) of the Act, 21 U.S.C. section 360bbb-3(b)(1), unless the authorization is terminated or revoked.  Performed at [facility], [place] [address], [place], [place] [address]   Respiratory (~20 pathogens) panel by PCR     Status: None   Collection Time: 03/11/22  5:28 PM   Specimen: Nasopharyngeal Swab; Respiratory  Result Value Ref Range Status   Adenovirus NOT DETECTED NOT DETECTED Final   Coronavirus 229E NOT DETECTED NOT DETECTED Final    Comment: (NOTE) The Coronavirus on the Respiratory Panel, DOES NOT test for the novel  Coronavirus (2019 nCoV)    Coronavirus HKU1 NOT DETECTED NOT DETECTED Final   Coronavirus NL63 NOT DETECTED NOT DETECTED Final   Coronavirus OC43 NOT DETECTED NOT DETECTED Final   Metapneumovirus NOT DETECTED NOT DETECTED Final  Rhinovirus / Enterovirus NOT DETECTED NOT DETECTED Final   Influenza A NOT DETECTED NOT DETECTED Final   Influenza B NOT DETECTED NOT DETECTED Final   Parainfluenza Virus 1 NOT DETECTED NOT DETECTED Final   Parainfluenza Virus 2 NOT DETECTED NOT  DETECTED Final   Parainfluenza Virus 3 NOT DETECTED NOT DETECTED Final   Parainfluenza Virus 4 NOT DETECTED NOT DETECTED Final   Respiratory Syncytial Virus NOT DETECTED NOT DETECTED Final   Bordetella pertussis NOT DETECTED NOT DETECTED Final   Bordetella Parapertussis NOT DETECTED NOT DETECTED Final   Chlamydophila pneumoniae NOT DETECTED NOT DETECTED Final   Mycoplasma pneumoniae NOT DETECTED NOT DETECTED Final    Comment: Performed at [facility], [place] [address], [place], [place] [address]         Radiology Studies: CT ABDOMEN PELVIS WO CONTRAST  Result Date: 03/11/2022 CLINICAL DATA:  abdominal pain and distention. Pt complains of diffuse abdominal pain. Patient has not had a bowel movement for 7 days. He has tried laxative OTC with no relief. Patient reports increased abdominal distention in the last 7 days. No fever, nausea, vomiting EXAM: CT ABDOMEN AND PELVIS WITHOUT CONTRAST TECHNIQUE: Multidetector CT imaging of the abdomen and pelvis was performed following the standard protocol without IV contrast. RADIATION DOSE REDUCTION: This exam was performed according to the departmental dose-optimization program which includes automated exposure control, adjustment of the mA and/or kV according to patient size and/or use of iterative reconstruction technique. COMPARISON:  CT abdomen pelvis 07/22/2018 FINDINGS: Lower chest: Trace pericardial effusion. Tiny hiatal hernia. No acute abnormality. Hepatobiliary: No focal liver abnormality. No gallstones, gallbladder wall thickening, or pericholecystic fluid. No biliary dilatation. Pancreas: No focal lesion. Normal pancreatic contour. No surrounding inflammatory changes. No main pancreatic ductal dilatation. Spleen: Normal in size without focal abnormality.  Splenule noted. Adrenals/Urinary Tract: No adrenal nodule bilaterally. No nephrolithiasis and no hydronephrosis. Fluid density lesions within the right kidney likely represent simple renal  cysts. Simple renal cysts, in the absence of clinically indicated signs/symptoms, require no independent follow-up. No ureterolithiasis or hydroureter. Perivesicular fat stranding. Stomach/Bowel: Stomach is within normal limits. No evidence of bowel wall thickening or dilatation. Status post appendectomy. Vascular/Lymphatic: No abdominal aorta or iliac aneurysm. Mild atherosclerotic plaque of the aorta and its branches. No abdominal, pelvic, or inguinal lymphadenopathy. Reproductive: Prominent prostate measuring up to 4.7 cm. Other: No intraperitoneal free fluid. No intraperitoneal free gas. No organized fluid collection. Musculoskeletal: Small fat containing umbilical hernia. No suspicious lytic or blastic osseous lesions. No acute displaced fracture. IMPRESSION: 1. Findings suggestive of cystitis.  Correlate with urinalysis. 2. Tiny hiatal hernia. Electronically Signed   By: Iven Finn M.D.   On: 03/11/2022 01:09        Scheduled Meds:  aspirin EC  81 mg Oral Daily   docusate sodium  100 mg Oral BID   heparin  5,000 Units Subcutaneous Q8H   insulin aspart  0-15 Units Subcutaneous Q4H   rosuvastatin  20 mg Oral Daily   senna  1 tablet Oral BID   tamsulosin  0.4 mg Oral BID   Continuous Infusions:  cefTRIAXone (ROCEPHIN)  IV       LOS: 0 days    Time spent:40 min    Lorry Furber, Geraldo Docker, MD [facility]   If 7PM-7AM, please contact night-coverage 03/12/2022, 8:12 AM

## 2022-03-13 DIAGNOSIS — R339 Retention of urine, unspecified: Secondary | ICD-10-CM | POA: Diagnosis not present

## 2022-03-13 DIAGNOSIS — I7121 Aneurysm of the ascending aorta, without rupture: Secondary | ICD-10-CM | POA: Diagnosis not present

## 2022-03-13 DIAGNOSIS — N179 Acute kidney failure, unspecified: Secondary | ICD-10-CM | POA: Diagnosis not present

## 2022-03-13 DIAGNOSIS — R338 Other retention of urine: Secondary | ICD-10-CM | POA: Diagnosis not present

## 2022-03-13 LAB — CBC WITH DIFFERENTIAL/PLATELET
Abs Immature Granulocytes: 0.05 10*3/uL (ref 0.00–0.07)
Basophils Absolute: 0 10*3/uL (ref 0.0–0.1)
Basophils Relative: 0 %
Eosinophils Absolute: 0.1 10*3/uL (ref 0.0–0.5)
Eosinophils Relative: 2 %
HCT: 29 % — ABNORMAL LOW (ref 39.0–52.0)
Hemoglobin: 9.2 g/dL — ABNORMAL LOW (ref 13.0–17.0)
Immature Granulocytes: 1 %
Lymphocytes Relative: 27 %
Lymphs Abs: 1.9 10*3/uL (ref 0.7–4.0)
MCH: 28.9 pg (ref 26.0–34.0)
MCHC: 31.7 g/dL (ref 30.0–36.0)
MCV: 91.2 fL (ref 80.0–100.0)
Monocytes Absolute: 0.5 10*3/uL (ref 0.1–1.0)
Monocytes Relative: 7 %
Neutro Abs: 4.6 10*3/uL (ref 1.7–7.7)
Neutrophils Relative %: 63 %
Platelets: 174 10*3/uL (ref 150–400)
RBC: 3.18 MIL/uL — ABNORMAL LOW (ref 4.22–5.81)
RDW: 14.6 % (ref 11.5–15.5)
WBC: 7.1 10*3/uL (ref 4.0–10.5)
nRBC: 0 % (ref 0.0–0.2)

## 2022-03-13 LAB — PROCALCITONIN: Procalcitonin: <0.1 ng/mL

## 2022-03-13 LAB — COMPREHENSIVE METABOLIC PANEL
ALT: 28 U/L (ref 0–44)
AST: 19 U/L (ref 15–41)
Albumin: 2.7 g/dL — ABNORMAL LOW (ref 3.5–5.0)
Alkaline Phosphatase: 51 U/L (ref 38–126)
Anion gap: 11 (ref 5–15)
BUN: 18 mg/dL (ref 8–23)
CO2: 21 mmol/L — ABNORMAL LOW (ref 22–32)
Calcium: 8.9 mg/dL (ref 8.9–10.3)
Chloride: 108 mmol/L (ref 98–111)
Creatinine, Ser: 1.57 mg/dL — ABNORMAL HIGH (ref 0.61–1.24)
GFR, Estimated: 49 mL/min — ABNORMAL LOW (ref >60)
Glucose, Bld: 107 mg/dL — ABNORMAL HIGH (ref 70–99)
Potassium: 2.9 mmol/L — ABNORMAL LOW (ref 3.5–5.1)
Sodium: 140 mmol/L (ref 135–145)
Total Bilirubin: 0.6 mg/dL (ref 0.3–1.2)
Total Protein: 5.3 g/dL — ABNORMAL LOW (ref 6.5–8.1)

## 2022-03-13 LAB — PHOSPHORUS: Phosphorus: 3.3 mg/dL (ref 2.5–4.6)

## 2022-03-13 LAB — GLUCOSE, CAPILLARY
Glucose-Capillary: 175 mg/dL — ABNORMAL HIGH (ref 70–99)
Glucose-Capillary: 185 mg/dL — ABNORMAL HIGH (ref 70–99)
Glucose-Capillary: 189 mg/dL — ABNORMAL HIGH (ref 70–99)
Glucose-Capillary: 197 mg/dL — ABNORMAL HIGH (ref 70–99)
Glucose-Capillary: 198 mg/dL — ABNORMAL HIGH (ref 70–99)
Glucose-Capillary: 89 mg/dL (ref 70–99)

## 2022-03-13 LAB — URINE CULTURE: Culture: NO GROWTH

## 2022-03-13 LAB — MAGNESIUM: Magnesium: 2 mg/dL (ref 1.7–2.4)

## 2022-03-13 MED ORDER — POTASSIUM CHLORIDE 10 MEQ/100ML IV SOLN
10.0000 meq | INTRAVENOUS | Status: AC
  Administered 2022-03-13 (×6): 10 meq via INTRAVENOUS
  Filled 2022-03-13 (×6): qty 100

## 2022-03-13 MED ORDER — ENSURE MAX PROTEIN PO LIQD
11.0000 [oz_av] | Freq: Every day | ORAL | Status: DC
  Administered 2022-03-13 – 2022-03-16 (×4): 11 [oz_av] via ORAL

## 2022-03-13 MED ORDER — POTASSIUM CHLORIDE CRYS ER 20 MEQ PO TBCR
40.0000 meq | EXTENDED_RELEASE_TABLET | Freq: Once | ORAL | Status: AC
  Administered 2022-03-13: 40 meq via ORAL
  Filled 2022-03-13: qty 2

## 2022-03-13 NOTE — Inpatient Diabetes Management (Signed)
Inpatient Diabetes Program Recommendations  AACE/ADA: New Consensus Statement on Inpatient Glycemic Control (2015)  Target Ranges:  Prepandial:   less than 140 mg/dL      Peak postprandial:   less than 180 mg/dL (1-2 hours)      Critically ill patients:  140 - 180 mg/dL   Lab Results  Component Value Date   GLUCAP 185 (H) 03/13/2022   HGBA1C 14.4 (H) 02/28/2022    Review of Glycemic Control  Diabetes history: DM2 Outpatient Diabetes medications: Novolin 70/30 20 units BID Current orders for Inpatient glycemic control: Novolog 0-15 Q4H  Inpatient Diabetes Program Recommendations:    Consider starting 70/30 15 units BID  Spoke with pt at bedside this afternoon. Pt states he's been taking 70/30 20 units BID at home. Monitors blood sugars 2x/day. States "I'm not here because of my blood sugars." States he's been making good effort in changing eating habits and trying to keep his blood sugars in good control. Had diet questions and pt states he has good understanding of diabetes principles. Appreciative of visit.  Thank you. Lorenda Peck, RD, LDN, [place] Inpatient Diabetes Coordinator [phone]

## 2022-03-13 NOTE — Progress Notes (Signed)
PROGRESS NOTE    Curtis Saunders  [id number] DOB: 05-11-1955 DOA: 03/10/2022 PCP: Camillia Herter, NP     Brief Narrative:   Curtis Saunders  67 y.o.BM PMHx DM type II uncontrolled with hyperglycemia, HTN, LVH, HLD, CKD stage III (baseline Cr 1.4), BPH, CVA   Presents emergency department with 1 week of constipation and with 2 days of significantly decreased urine output.  States he feels like he needs to urinate but is only able to pass very small dribbles of urine.  Only urinated once around 5 PM today, scant amount, here unable to provide small amount of urine in the urinal despite lower abdominal distention and tenderness to palpation.     Subjective: 2/19 A/O x 4, negative nausea, negative vomiting, negative abdominal pain.   Assessment & Plan: Covid vaccination;   Principal Problem:   Acute retention of urine Active Problems:   LVH (left ventricular hypertrophy)   Ascending aortic aneurysm (HCC)   CVA (cerebral vascular accident) ([place])   Stage 3 chronic kidney disease (HCC)   BPH (benign prostatic hyperplasia)   Benign essential HTN   Uncontrolled type 2 diabetes mellitus with hyperglycemia, with long-term current use of insulin ([place])   Acute urinary retention   Obesity (BMI 30-39.9)  Sepsis - On admission patient meets criteria for sepsis HR> 90, RR> 20, WBC> 12 K.  Most likely site of infection is urinary tract -Influenza A/B/COVID negative -2/19 resolved   Acute urinary retention/BPH - 2/16 Inrease Flomax 0.4 mg BID -2/17 PSA =3.66.  Normal -2/17 patient has normal PSA will add Tadalafil 5 mg daily - 2/17 follow-up with urologist upon discharge if symptoms persist. -2/19 remove Foley catheter: Voiding trial   CKD stage III (baseline Cr ~1.5) Lab Results  Component Value Date   CREATININE 1.57 (H) 03/13/2022   CREATININE 1.81 (H) 03/12/2022   CREATININE 2.39 (H) 03/11/2022   CREATININE 2.73 (H) 03/10/2022   CREATININE 1.34 (H) 03/07/2022   -2/18 improving with hydration -2/19 at baseline.   Essential HTN -Patient's BP currently controlled hold all BP meds patient   Ascending aortic aneurysm -SBP goal<120 -2/18 BP at goal   DM type II uncontrolled with hyperglycemia -2/6 Hemoglobin A1c= 14.4 -2/18 DM coordinator: Uncontrolled diabetic requires education -2/18 DM nutrition: Uncontrolled diabetic requires education CBG (last 3)  Recent Labs    03/13/22 0355 03/13/22 0722 03/13/22 1129  GLUCAP 89 189* 185*   -Moderate SSI   HLD - 2/17 lipid panel: LDL= 49 at goal  - Crestor 20 mg daily  Hypokalemia - Potassium goal> 4 - 2/18 Potassium IV 60 mEq -2/19 potassium IV 60 meq -2/19 K-Dur 40 mEq -2/19 (normaly in Primary Adrenal Insufficiency we would see a Hyponatremia/HyperKalemia)   Adrenal insufficiency? -2/18 A.m. cortisol= 2.8 (low) -2/18 ACTH pending  -2/19 renin/aldosterone pending -2/19 months ACTH returns will need to perform Cosyntropin Stim test  obesity (BMI 30.05 kg/m) -Patient to discuss with PCP          Mobility Assessment (last 72 hours)     Mobility Assessment     Row Name 03/13/22 0742 03/12/22 1100 03/11/22 1500       Does patient have an order for bedrest or is patient medically unstable No - Continue assessment No - Continue assessment No - Continue assessment     What is the highest level of mobility based on the progressive mobility assessment? Level 5 (Walks with assist in room/hall) - Balance while stepping forward/back and can walk  in room with assist - Complete Level 5 (Walks with assist in room/hall) - Balance while stepping forward/back and can walk in room with assist - Complete Level 5 (Walks with assist in room/hall) - Balance while stepping forward/back and can walk in room with assist - Complete                    DVT prophylaxis: Subcu heparin Code Status: Full Family Communication:  Status is: Inpatient    Dispo: The patient is from: Home               Anticipated d/c is to: Home              Anticipated d/c date is: 3 days              Patient currently is not medically stable to d/c.      Consultants:    Procedures/Significant Events:  2/17 CT abdomen pelvis W0 contrast Findings suggestive of cystitis.  Correlate with urinalysis. 2. Tiny hiatal hernia.  I have personally reviewed and interpreted all radiology studies and my findings are as above.  VENTILATOR SETTINGS:    Cultures 2/17 influenza A/B negative 2/17 SARS coronavirus negative 2/17 respiratory virus panel negative 2/17 blood LEFT hand NGTD   Antimicrobials: Anti-infectives (From admission, onward)    Start     Ordered Stop   03/12/22 0800  cefTRIAXone (ROCEPHIN) 2 g in sodium chloride 0.9 % 100 mL IVPB        03/11/22 1119 03/19/22 0759   03/11/22 1145  cefTRIAXone (ROCEPHIN) 1 g in sodium chloride 0.9 % 100 mL IVPB        03/11/22 1139 03/12/22 0526   03/11/22 0330  cefTRIAXone (ROCEPHIN) 1 g in sodium chloride 0.9 % 100 mL IVPB        03/11/22 0318 03/11/22 0500         Devices    LINES / TUBES:      Continuous Infusions:  cefTRIAXone (ROCEPHIN)  IV 2 g (03/13/22 1056)     Objective: Vitals:   03/12/22 1407 03/12/22 2042 03/13/22 0605 03/13/22 1348  BP: (!) 140/75 133/80 130/79 131/86  Pulse: 76 81 70 76  Resp:  16 16 14  $ Temp: 98.1 F (36.7 C) 98.1 F (36.7 C) 98.6 F (37 C)   TempSrc: Oral Oral Oral   SpO2: 99% 97% 96% 98%  Weight:      Height:        Intake/Output Summary (Last 24 hours) at 03/13/2022 1357 Last data filed at 03/13/2022 1302 Gross per 24 hour  Intake 1180 ml  Output 3260 ml  Net -2080 ml    Filed Weights   03/11/22 0231  Weight: 95 kg   Physical Exam:  General: A/O x 4 No acute respiratory distress Eyes: negative scleral hemorrhage, negative anisocoria, negative icterus ENT: Negative Runny nose, negative gingival bleeding, Neck:  Negative scars, masses, torticollis, lymphadenopathy,  JVD Lungs: Clear to auscultation bilaterally without wheezes or crackles Cardiovascular: Regular rate and rhythm without murmur gallop or rub normal S1 and S2 Abdomen: negative abdominal pain, nondistended, positive soft, bowel sounds, no rebound, no ascites, no appreciable mass Extremities: No significant cyanosis, clubbing, or edema bilateral lower extremities Skin: Negative rashes, lesions, ulcers Psychiatric:  Negative depression, negative anxiety, negative fatigue, negative mania  Central nervous system:  Cranial nerves II through XII intact, tongue/uvula midline, all extremities muscle strength 5/5, sensation intact throughout, negative dysarthria, negative expressive aphasia, negative receptive  aphasia.   .     Data Reviewed: Care during the described time interval was provided by me .  I have reviewed this patient's available data, including medical history, events of note, physical examination, and all test results as part of my evaluation.  CBC: Recent Labs  Lab 03/10/22 2258 03/12/22 0407 03/13/22 0418  WBC 11.2* 8.1 7.1  NEUTROABS 8.3* 4.7 4.6  HGB 11.2* 9.3* 9.2*  HCT 34.8* 29.6* 29.0*  MCV 90.2 92.5 91.2  PLT 217 176 [id number]    Basic Metabolic Panel: Recent Labs  Lab 03/07/22 1104 03/10/22 2258 03/11/22 0537 03/12/22 0407 03/13/22 0418  NA 142 134* 140 142 140  K 3.6 3.4* 3.6 3.3* 2.9*  CL 106 103 108 108 108  CO2 18* 23 25 25 $ 21*  GLUCOSE 130* 398* 236* 177* 107*  BUN 15 18 16 19 18  $ CREATININE 1.34* 2.73* 2.39* 1.81* 1.57*  CALCIUM 9.8 9.0 8.8* 9.1 8.9  MG  --   --   --  2.1 2.0  PHOS  --   --   --  3.7 3.3    GFR: Estimated Creatinine Clearance: 55 mL/min (A) (by C-G formula based on SCr of 1.57 mg/dL (H)). Liver Function Tests: Recent Labs  Lab 03/10/22 2258 03/12/22 0407 03/13/22 0418  AST 24 20 19  $ ALT 35 27 28  ALKPHOS 67 50 51  BILITOT 0.6 0.4 0.6  PROT 6.9 5.4* 5.3*  ALBUMIN 3.8 2.6* 2.7*    Recent Labs  Lab 03/10/22 2258  LIPASE  40    No results for input(s): "AMMONIA" in the last 168 hours. Coagulation Profile: Recent Labs  Lab 03/12/22 0407  INR 1.0    Cardiac Enzymes: No results for input(s): "CKTOTAL", "CKMB", "CKMBINDEX", "TROPONINI" in the last 168 hours. BNP (last 3 results) No results for input(s): "PROBNP" in the last 8760 hours. HbA1C: No results for input(s): "HGBA1C" in the last 72 hours. CBG: Recent Labs  Lab 03/12/22 2045 03/12/22 2317 03/13/22 0355 03/13/22 0722 03/13/22 1129  GLUCAP 370* 337* 89 189* 185*    Lipid Profile: Recent Labs    03/11/22 1340  CHOL 111  HDL 52  LDLCALC 49  TRIG 50  CHOLHDL 2.1    Thyroid Function Tests: No results for input(s): "TSH", "T4TOTAL", "FREET4", "T3FREE", "THYROIDAB" in the last 72 hours. Anemia Panel: No results for input(s): "VITAMINB12", "FOLATE", "FERRITIN", "TIBC", "IRON", "RETICCTPCT" in the last 72 hours. Sepsis Labs: Recent Labs  Lab 03/11/22 1340 03/11/22 1625 03/12/22 0407 03/13/22 0418  PROCALCITON 0.10  --  <0.10 <0.10  LATICACIDVEN 0.9 0.9  --   --      Recent Results (from the past 240 hour(s))  Culture, blood (Routine X 2) w Reflex to ID Panel     Status: None (Preliminary result)   Collection Time: 03/11/22  1:40 PM   Specimen: BLOOD LEFT HAND  Result Value Ref Range Status   Specimen Description   Final    BLOOD LEFT HAND Performed at [facility], [place] [address] [place], [place] [address]    Special Requests   Final    BOTTLES DRAWN AEROBIC AND ANAEROBIC Blood Culture results may not be optimal due to an inadequate volume of blood received in culture bottles Performed at [facility] [address], [place], [place] [address]    Culture   Final    NO GROWTH 2 DAYS Performed at [facility] [address], [place], [name]  [address]    Report Status PENDING  Incomplete  Culture, blood (Routine X 2) w Reflex to ID Panel     Status: None (Preliminary result)    Collection Time: 03/11/22  1:40 PM   Specimen: BLOOD LEFT ARM  Result Value Ref Range Status   Specimen Description   Final    BLOOD LEFT ARM Performed at [facility], [address] [place], [place] [address]    Special Requests   Final    BOTTLES DRAWN AEROBIC AND ANAEROBIC Blood Culture adequate volume Performed at [facility] [address], [place], [place] [address]    Culture   Final    NO GROWTH 2 DAYS Performed at [facility] [address], [place],  [address]    Report Status PENDING  Incomplete  Resp panel by RT-PCR (RSV, Flu A&B, Covid) Anterior Nasal Swab     Status: None   Collection Time: 03/11/22  5:28 PM   Specimen: Anterior Nasal Swab  Result Value Ref Range Status   SARS Coronavirus 2 by RT PCR NEGATIVE NEGATIVE Final    Comment: (NOTE) SARS-CoV-2 target nucleic acids are NOT DETECTED.  The SARS-CoV-2 RNA is generally detectable in upper respiratory specimens during the acute phase of infection. The lowest concentration of SARS-CoV-2 viral copies this assay can detect is 138 copies/mL. A negative result does not preclude SARS-Cov-2 infection and should not be used as the sole basis for treatment or other patient management decisions. A negative result may occur with  improper specimen collection/handling, submission of specimen other than nasopharyngeal swab, presence of viral mutation(s) within the areas targeted by this assay, and inadequate number of viral copies(<138 copies/mL). A negative result must be combined with clinical observations, patient history, and epidemiological information. The expected result is Negative.  Fact Sheet for Patients:  EntrepreneurPulse.com.au  Fact Sheet for Healthcare Providers:  IncredibleEmployment.be  This test is no t yet approved or cleared by the [place] FDA and  has been authorized for detection and/or diagnosis of SARS-CoV-2  by FDA under an Emergency Use Authorization (EUA). This EUA will remain  in effect (meaning this test can be used) for the duration of the COVID-19 declaration under Section 564(b)(1) of the Act, 21 U.S.C.section 360bbb-3(b)(1), unless the authorization is terminated  or revoked sooner.       Influenza A by PCR NEGATIVE NEGATIVE Final   Influenza B by PCR NEGATIVE NEGATIVE Final    Comment: (NOTE) The Xpert Xpress SARS-CoV-2/FLU/RSV plus assay is intended as an aid in the diagnosis of influenza from Nasopharyngeal swab specimens and should not be used as a sole basis for treatment. Nasal washings and aspirates are unacceptable for Xpert Xpress SARS-CoV-2/FLU/RSV testing.  Fact Sheet for Patients: EntrepreneurPulse.com.au  Fact Sheet for Healthcare Providers: IncredibleEmployment.be  This test is not yet approved or cleared by the [place] FDA and has been authorized for detection and/or diagnosis of SARS-CoV-2 by FDA under an Emergency Use Authorization (EUA). This EUA will remain in effect (meaning this test can be used) for the duration of the COVID-19 declaration under Section 564(b)(1) of the Act, 21 U.S.C. section 360bbb-3(b)(1), unless the authorization is terminated or revoked.     Resp Syncytial Virus by PCR NEGATIVE NEGATIVE Final    Comment: (NOTE) Fact Sheet for Patients: EntrepreneurPulse.com.au  Fact Sheet for Healthcare Providers: IncredibleEmployment.be  This test is not yet approved or cleared by the [place] FDA and has been authorized for detection  and/or diagnosis of SARS-CoV-2 by FDA under an Emergency Use Authorization (EUA). This EUA will remain in effect (meaning this test can be used) for the duration of the COVID-19 declaration under Section 564(b)(1) of the Act, 21 U.S.C. section 360bbb-3(b)(1), unless the authorization is terminated or revoked.  Performed at  [facility], [place] [address] [place], [place] [address]   Respiratory (~20 pathogens) panel by PCR     Status: None   Collection Time: 03/11/22  5:28 PM   Specimen: Nasopharyngeal Swab; Respiratory  Result Value Ref Range Status   Adenovirus NOT DETECTED NOT DETECTED Final   Coronavirus 229E NOT DETECTED NOT DETECTED Final    Comment: (NOTE) The Coronavirus on the Respiratory Panel, DOES NOT test for the novel  Coronavirus (2019 nCoV)    Coronavirus HKU1 NOT DETECTED NOT DETECTED Final   Coronavirus NL63 NOT DETECTED NOT DETECTED Final   Coronavirus OC43 NOT DETECTED NOT DETECTED Final   Metapneumovirus NOT DETECTED NOT DETECTED Final   Rhinovirus / Enterovirus NOT DETECTED NOT DETECTED Final   Influenza A NOT DETECTED NOT DETECTED Final   Influenza B NOT DETECTED NOT DETECTED Final   Parainfluenza Virus 1 NOT DETECTED NOT DETECTED Final   Parainfluenza Virus 2 NOT DETECTED NOT DETECTED Final   Parainfluenza Virus 3 NOT DETECTED NOT DETECTED Final   Parainfluenza Virus 4 NOT DETECTED NOT DETECTED Final   Respiratory Syncytial Virus NOT DETECTED NOT DETECTED Final   Bordetella pertussis NOT DETECTED NOT DETECTED Final   Bordetella Parapertussis NOT DETECTED NOT DETECTED Final   Chlamydophila pneumoniae NOT DETECTED NOT DETECTED Final   Mycoplasma pneumoniae NOT DETECTED NOT DETECTED Final    Comment: Performed at [facility], [place]. [address], [place], [place] [address]  Urine Culture     Status: None   Collection Time: 03/12/22 12:21 PM   Specimen: Urine, Random  Result Value Ref Range Status   Specimen Description   Final    URINE, RANDOM Performed at [facility] [address], [place], [place] [address]    Special Requests URINE, CLEAN CATCH  Final   Culture   Final    NO GROWTH Performed at [facility], [place] [address], [place], [place] [address]    Report Status 03/13/2022 FINAL  Final         Radiology  Studies: No results found.      Scheduled Meds:  aspirin EC  81 mg Oral Daily   Chlorhexidine Gluconate Cloth  6 each Topical Daily   docusate sodium  100 mg Oral BID   heparin  5,000 Units Subcutaneous Q8H   insulin aspart  0-15 Units Subcutaneous Q4H   Ensure Max Protein  11 oz Oral Daily   rosuvastatin  20 mg Oral Daily   senna  1 tablet Oral BID   tadalafil  5 mg Oral Daily   tamsulosin  0.4 mg Oral BID   Continuous Infusions:  cefTRIAXone (ROCEPHIN)  IV 2 g (03/13/22 1056)     LOS: 1 day    Time spent:40 min    Philomene Haff, Geraldo Docker, MD [facility]   If 7PM-7AM, please contact night-coverage 03/13/2022, 1:57 PM

## 2022-03-13 NOTE — Progress Notes (Signed)
Nutrition Note  RD consulted for nutrition education regarding diabetes.   Patient provided diabetes diet education recently on 03/01/22. Pt reports he did stop drinking koolaid. Drank 2 cartons of milk today. Did not eat a breakfast tray. States he has a chicken sandwich coming for lunch.  Pt states he still has his handout from the [facility]. Will place an additional copy in AVS.   Reviewed sugar-free flavoring options for water.  Pt states he does not like the hospital food. Will order Ensure Max for patient to try. (6g CHO per bottle).    Lab Results  Component Value Date   HGBA1C 14.4 (H) 02/28/2022    RD provided "Carbohydrate Counting for People with Diabetes" handout from the Academy of [employer] and Dietetics. Discussed different food groups and their effects on blood sugar, emphasizing carbohydrate-containing foods. Provided list of carbohydrates and recommended serving sizes of common foods.  Discussed importance of controlled and consistent carbohydrate intake throughout the day. Provided examples of ways to balance meals/snacks and encouraged intake of high-fiber, whole grain complex carbohydrates. Teach back method used.  Expect fair compliance. Again, not very engaged in education.   Body mass index is 30.05 kg/m. Pt meets criteria for obesity based on current BMI.  Current diet order is heart healthy/CHO modified, not eating meals consistently.  Labs and medications reviewed. No further nutrition interventions warranted at this time.  If additional nutrition issues arise, please re-consult RD.  Curtis Bibles, MS, RD, LDN Inpatient Clinical Dietitian Contact information available via Amion

## 2022-03-13 NOTE — Discharge Instructions (Signed)
Carbohydrate Counting For People With Diabetes  Foods with carbohydrates make your blood glucose level go up. Learning how to count carbohydrates can help you control your blood glucose levels. First, identify the foods you eat that contain carbohydrates. Then, using the Foods with Carbohydrates chart, determine about how much carbohydrates are in your meals and snacks. Make sure you are eating foods with fiber, protein, and healthy fat along with your carbohydrate foods. Foods with Carbohydrates The following table shows carbohydrate foods that have about 15 grams of carbohydrate each. Using measuring cups, spoons, or a food scale when you first begin learning about carbohydrate counting can help you learn about the portion sizes you typically eat. The following foods have 15 grams carbohydrate each:  Grains 1 slice bread (1 ounce)  1 small tortilla (6-inch size)   large bagel (1 ounce)  1/3 cup pasta or rice (cooked)   hamburger or hot dog bun ( ounce)   cup cooked cereal   to  cup ready-to-eat cereal  2 taco shells (5-inch size) Fruit 1 small fresh fruit ( to 1 cup)   medium banana  17 small grapes (3 ounces)  1 cup melon or berries   cup canned or frozen fruit  2 tablespoons dried fruit (blueberries, cherries, cranberries, raisins)   cup unsweetened fruit juice  Starchy Vegetables  cup cooked beans, peas, corn, potatoes/sweet potatoes   large baked potato (3 ounces)  1 cup acorn or butternut squash  Snack Foods 3 to 6 crackers  8 potato chips or 13 tortilla chips ( ounce to 1 ounce)  3 cups popped popcorn  Dairy 3/4 cup (6 ounces) nonfat plain yogurt, or yogurt with sugar-free sweetener  1 cup milk  1 cup plain rice, soy, coconut or flavored almond milk Sweets and Desserts  cup ice cream or frozen yogurt  1 tablespoon jam, jelly, pancake syrup, table sugar, or honey  2 tablespoons light pancake syrup  1 inch square of frosted cake or 2 inch square of unfrosted  cake  2 small cookies (2/3 ounce each) or  large cookie  Sometimes you'll have to estimate carbohydrate amounts if you don't know the exact recipe. One cup of mixed foods like soups can have 1 to 2 carbohydrate servings, while some casseroles might have 2 or more servings of carbohydrate. Foods that have less than 20 calories in each serving can be counted as "free" foods. Count 1 cup raw vegetables, or  cup cooked non-starchy vegetables as "free" foods. If you eat 3 or more servings at one meal, then count them as 1 carbohydrate serving.  Foods without Carbohydrates  Not all foods contain carbohydrates. Meat, some dairy, fats, non-starchy vegetables, and many beverages don't contain carbohydrate. So when you count carbohydrates, you can generally exclude chicken, pork, beef, fish, seafood, eggs, tofu, cheese, butter, sour cream, avocado, nuts, seeds, olives, mayonnaise, water, black coffee, unsweetened tea, and zero-calorie drinks. Vegetables with no or low carbohydrate include green beans, cauliflower, tomatoes, and onions. How much carbohydrate should I eat at each meal?  Carbohydrate counting can help you plan your meals and manage your weight. Following are some starting points for carbohydrate intake at each meal. Work with your registered dietitian nutritionist to find the best range that works for your blood glucose and weight.   To Lose Weight To Maintain Weight  Women 2 - 3 carb servings 3 - 4 carb servings  Men 3 - 4 carb servings 4 - 5 carb servings  Checking your   blood glucose after meals will help you know if you need to adjust the timing, type, or number of carbohydrate servings in your meal plan. Achieve and keep a healthy body weight by balancing your food intake and physical activity.  Tips How should I plan my meals?  Plan for half the food on your plate to include non-starchy vegetables, like salad greens, broccoli, or carrots. Try to eat 3 to 5 servings of non-starchy vegetables  every day. Have a protein food at each meal. Protein foods include chicken, fish, meat, eggs, or beans (note that beans contain carbohydrate). These two food groups (non-starchy vegetables and proteins) are low in carbohydrate. If you fill up your plate with these foods, you will eat less carbohydrate but still fill up your stomach. Try to limit your carbohydrate portion to  of the plate.  What fats are healthiest to eat?  Diabetes increases risk for heart disease. To help protect your heart, eat more healthy fats, such as olive oil, nuts, and avocado. Eat less saturated fats like butter, cream, and high-fat meats, like bacon and sausage. Avoid trans fats, which are in all foods that list "partially hydrogenated oil" as an ingredient. What should I drink?  Choose drinks that are not sweetened with sugar. The healthiest choices are water, carbonated or seltzer waters, and tea and coffee without added sugars.  Sweet drinks will make your blood glucose go up very quickly. One serving of soda or energy drink is  cup. It is best to drink these beverages only if your blood glucose is low.  Artificially sweetened, or diet drinks, typically do not increase your blood glucose if they have zero calories in them. Read labels of beverages, as some diet drinks do have carbohydrate and will raise your blood glucose. Label Reading Tips Read Nutrition Facts labels to find out how many grams of carbohydrate are in a food you want to eat. Don't forget: sometimes serving sizes on the label aren't the same as how much food you are going to eat, so you may need to calculate how much carbohydrate is in the food you are serving yourself.   Carbohydrate Counting for People with Diabetes Sample 1-Day Menu  Breakfast  cup yogurt, low fat, low sugar (1 carbohydrate serving)   cup cereal, ready-to-eat, unsweetened (1 carbohydrate serving)  1 cup strawberries (1 carbohydrate serving)   cup almonds ( carbohydrate serving)   Lunch 1, 5 ounce can chunk light tuna  2 ounces cheese, low fat cheddar  6 whole wheat crackers (1 carbohydrate serving)  1 small apple (1 carbohydrate servings)   cup carrots ( carbohydrate serving)   cup snap peas  1 cup 1% milk (1 carbohydrate serving)   Evening Meal Stir fry made with: 3 ounces chicken  1 cup brown rice (3 carbohydrate servings)   cup broccoli ( carbohydrate serving)   cup green beans   cup onions  1 tablespoon olive oil  2 tablespoons teriyaki sauce ( carbohydrate serving)  Evening Snack 1 extra small banana (1 carbohydrate serving)  1 tablespoon peanut butter   Carbohydrate Counting for People with Diabetes Vegan Sample 1-Day Menu  Breakfast 1 cup cooked oatmeal (2 carbohydrate servings)   cup blueberries (1 carbohydrate serving)  2 tablespoons flaxseeds  1 cup soymilk fortified with calcium and vitamin D  1 cup coffee  Lunch 2 slices whole wheat bread (2 carbohydrate servings)   cup baked tofu   cup lettuce  2 slices tomato  2 slices avocado     cup baby carrots ( carbohydrate serving)  1 orange (1 carbohydrate serving)  1 cup soymilk fortified with calcium and vitamin D   Evening Meal Burrito made with: 1 6-inch corn tortilla (1 carbohydrate serving)  1 cup refried vegetarian beans (2 carbohydrate servings)   cup chopped tomatoes   cup lettuce   cup salsa  1/3 cup brown rice (1 carbohydrate serving)  1 tablespoon olive oil for rice   cup zucchini   Evening Snack 6 small whole grain crackers (1 carbohydrate serving)  2 apricots ( carbohydrate serving)   cup unsalted peanuts ( carbohydrate serving)    Carbohydrate Counting for People with Diabetes Vegetarian (Lacto-Ovo) Sample 1-Day Menu  Breakfast 1 cup cooked oatmeal (2 carbohydrate servings)   cup blueberries (1 carbohydrate serving)  2 tablespoons flaxseeds  1 egg  1 cup 1% milk (1 carbohydrate serving)  1 cup coffee  Lunch 2 slices whole wheat bread (2 carbohydrate  servings)  2 ounces low-fat cheese   cup lettuce  2 slices tomato  2 slices avocado   cup baby carrots ( carbohydrate serving)  1 orange (1 carbohydrate serving)  1 cup unsweetened tea  Evening Meal Burrito made with: 1 6-inch corn tortilla (1 carbohydrate serving)   cup refried vegetarian beans (1 carbohydrate serving)   cup tomatoes   cup lettuce   cup salsa  1/3 cup brown rice (1 carbohydrate serving)  1 tablespoon olive oil for rice   cup zucchini  1 cup 1% milk (1 carbohydrate serving)  Evening Snack 6 small whole grain crackers (1 carbohydrate serving)  2 apricots ( carbohydrate serving)   cup unsalted peanuts ( carbohydrate serving)    Copyright 2020  [employer]. All rights reserved.  Using Nutrition Labels: Carbohydrate  Serving Size  Look at the serving size. All the information on the label is based on this portion. Servings Per Container  The number of servings contained in the package. Guidelines for Carbohydrate  Look at the total grams of carbohydrate in the serving size.  1 carbohydrate choice = 15 grams of carbohydrate. Range of Carbohydrate Grams Per Choice  Carbohydrate Grams/Choice Carbohydrate Choices  6-10   11-20 1  21-25 1  26-35 2  36-40 2  41-50 3  51-55 3  56-65 4  66-70 4  71-80 5    Copyright 2020  [employer]. All rights reserved.    Please check BMP in 1 week to monitor your renal functions are stable Check your blood pressure daily at home and write it down prior to your next appointment with your primary care physician since we have stopped your Norvasc and ramipril at the hospital.  You were started on a new medication called Flomax twice a day for your prostate which can also decrease your blood pressure.

## 2022-03-13 NOTE — Progress Notes (Signed)
Potassium running late due to decreased rate from burning in arm.

## 2022-03-14 DIAGNOSIS — R338 Other retention of urine: Secondary | ICD-10-CM | POA: Diagnosis not present

## 2022-03-14 DIAGNOSIS — N400 Enlarged prostate without lower urinary tract symptoms: Secondary | ICD-10-CM | POA: Diagnosis not present

## 2022-03-14 DIAGNOSIS — E2749 Other adrenocortical insufficiency: Secondary | ICD-10-CM

## 2022-03-14 DIAGNOSIS — N179 Acute kidney failure, unspecified: Secondary | ICD-10-CM | POA: Diagnosis not present

## 2022-03-14 DIAGNOSIS — I63319 Cerebral infarction due to thrombosis of unspecified middle cerebral artery: Secondary | ICD-10-CM

## 2022-03-14 LAB — MAGNESIUM: Magnesium: 2 mg/dL (ref 1.7–2.4)

## 2022-03-14 LAB — GLUCOSE, CAPILLARY
Glucose-Capillary: 163 mg/dL — ABNORMAL HIGH (ref 70–99)
Glucose-Capillary: 138 mg/dL — ABNORMAL HIGH (ref 70–99)
Glucose-Capillary: 102 mg/dL — ABNORMAL HIGH (ref 70–99)
Glucose-Capillary: 118 mg/dL — ABNORMAL HIGH (ref 70–99)
Glucose-Capillary: 170 mg/dL — ABNORMAL HIGH (ref 70–99)

## 2022-03-14 LAB — CBC WITH DIFFERENTIAL/PLATELET
Abs Immature Granulocytes: 0.09 10*3/uL — ABNORMAL HIGH (ref 0.00–0.07)
Basophils Absolute: 0 10*3/uL (ref 0.0–0.1)
Basophils Relative: 0 %
Eosinophils Absolute: 0.2 10*3/uL (ref 0.0–0.5)
Eosinophils Relative: 2 %
HCT: 29.1 % — ABNORMAL LOW (ref 39.0–52.0)
Hemoglobin: 9.2 g/dL — ABNORMAL LOW (ref 13.0–17.0)
Immature Granulocytes: 1 %
Lymphocytes Relative: 27 %
Lymphs Abs: 2.1 10*3/uL (ref 0.7–4.0)
MCH: 29 pg (ref 26.0–34.0)
MCHC: 31.6 g/dL (ref 30.0–36.0)
MCV: 91.8 fL (ref 80.0–100.0)
Monocytes Absolute: 0.4 10*3/uL (ref 0.1–1.0)
Monocytes Relative: 6 %
Neutro Abs: 4.8 10*3/uL (ref 1.7–7.7)
Neutrophils Relative %: 64 %
Platelets: 173 10*3/uL (ref 150–400)
RBC: 3.17 MIL/uL — ABNORMAL LOW (ref 4.22–5.81)
RDW: 14.6 % (ref 11.5–15.5)
WBC: 7.6 10*3/uL (ref 4.0–10.5)
nRBC: 0 % (ref 0.0–0.2)

## 2022-03-14 LAB — COMPREHENSIVE METABOLIC PANEL
ALT: 29 U/L (ref 0–44)
AST: 22 U/L (ref 15–41)
Albumin: 2.9 g/dL — ABNORMAL LOW (ref 3.5–5.0)
Alkaline Phosphatase: 60 U/L (ref 38–126)
Anion gap: 10 (ref 5–15)
BUN: 17 mg/dL (ref 8–23)
CO2: 22 mmol/L (ref 22–32)
Calcium: 8.9 mg/dL (ref 8.9–10.3)
Chloride: 107 mmol/L (ref 98–111)
Creatinine, Ser: 1.56 mg/dL — ABNORMAL HIGH (ref 0.61–1.24)
GFR, Estimated: 49 mL/min — ABNORMAL LOW (ref >60)
Glucose, Bld: 176 mg/dL — ABNORMAL HIGH (ref 70–99)
Potassium: 4 mmol/L (ref 3.5–5.1)
Sodium: 139 mmol/L (ref 135–145)
Total Bilirubin: 0.6 mg/dL (ref 0.3–1.2)
Total Protein: 5.7 g/dL — ABNORMAL LOW (ref 6.5–8.1)

## 2022-03-14 LAB — PHOSPHORUS: Phosphorus: 3.4 mg/dL (ref 2.5–4.6)

## 2022-03-14 LAB — ACTH: C206 ACTH: 6.9 pg/mL — ABNORMAL LOW (ref 7.2–63.3)

## 2022-03-14 MED ORDER — COSYNTROPIN 0.25 MG IJ SOLR
0.2500 mg | Freq: Once | INTRAMUSCULAR | Status: AC
  Administered 2022-03-15: 0.25 mg via INTRAVENOUS
  Filled 2022-03-14: qty 0.25

## 2022-03-14 MED ORDER — ORAL CARE MOUTH RINSE: 15.0000 mL | OROMUCOSAL | Status: DC | PRN

## 2022-03-14 NOTE — TOC CM/SW Note (Signed)
  Transition of Care [facility]) Screening Note   Patient Details  Name: Marquiz Garman Date of Birth: [birth year]   Transition of Care [facility]) CM/SW Contact:    Lennart Pall, LCSW Phone Number: 03/14/2022, 11:34 AM    Transition of Care Department [facility]) has reviewed patient and no TOC needs have been identified at this time. We will continue to monitor patient advancement through interdisciplinary progression rounds. If new patient transition needs arise, please place a TOC consult.

## 2022-03-14 NOTE — Progress Notes (Signed)
PROGRESS NOTE    Curtis Saunders  [id number] DOB: 02-Feb-1955 DOA: 03/10/2022 PCP: Camillia Herter, NP     Brief Narrative:   Curtis Saunders  67 y.o.BM PMHx DM type II uncontrolled with hyperglycemia, HTN, LVH, HLD, CKD stage III (baseline Cr 1.4), BPH, CVA   Presents emergency department with 1 week of constipation and with 2 days of significantly decreased urine output.  States he feels like he needs to urinate but is only able to pass very small dribbles of urine.  Only urinated once around 5 PM today, scant amount, here unable to provide small amount of urine in the urinal despite lower abdominal distention and tenderness to palpation.     Subjective: 2/20 afebrile overnight, A/O x 4, negative nausea, negative vomiting, negative abdominal pain   Assessment & Plan: Covid vaccination;   Principal Problem:   Acute retention of urine Active Problems:   LVH (left ventricular hypertrophy)   Ascending aortic aneurysm (HCC)   CVA (cerebral vascular accident) ([place])   Stage 3 chronic kidney disease (HCC)   BPH (benign prostatic hyperplasia)   Benign essential HTN   Uncontrolled type 2 diabetes mellitus with hyperglycemia, with long-term current use of insulin (Curtis Saunders)   Acute urinary retention   Obesity (BMI 30-39.9)  Sepsis - On admission patient meets criteria for sepsis HR> 90, RR> 20, WBC> 12 K.  Most likely site of infection is urinary tract -Influenza A/B/COVID negative -2/19 resolved   Acute urinary retention/BPH - 2/16 Inrease Flomax 0.4 mg BID -2/17 PSA =3.66.  Normal -2/17 patient has normal PSA will add Tadalafil 5 mg daily - 2/17 follow-up with urologist upon discharge if symptoms persist. -2/19 remove Foley catheter: Voiding trial -2/20 voiding without catheter.   CKD stage III (baseline Cr ~1.5) Lab Results  Component Value Date   CREATININE 1.56 (H) 03/14/2022   CREATININE 1.57 (H) 03/13/2022   CREATININE 1.81 (H) 03/12/2022   CREATININE  2.39 (H) 03/11/2022   CREATININE 2.73 (H) 03/10/2022  -2/18 improving with hydration -2/19 at baseline.   Essential HTN -Patient's BP currently controlled hold all BP meds patient   Ascending aortic aneurysm -SBP goal<120 -2/18 BP at goal   DM type II uncontrolled with hyperglycemia -2/6 Hemoglobin A1c= 14.4 -2/18 DM coordinator: Uncontrolled diabetic requires education -2/18 DM nutrition: Uncontrolled diabetic requires education CBG (last 3)  Recent Labs    03/13/22 2356 03/14/22 0459 03/14/22 0747  GLUCAP 198* 163* 138*   -Moderate SSI -2/20 patient with poor understanding of his disease process would restart him on his home diabetic regimen at discharge   HLD - 2/17 lipid panel: LDL= 49 at goal  - Crestor 20 mg daily  Hypokalemia - Potassium goal> 4 - 2/18 Potassium IV 60 mEq -2/19 potassium IV 60 meq -2/19 K-Dur 40 mEq -2/19 (normaly in Primary Adrenal Insufficiency we would see a Hyponatremia/HyperKalemia)   Adrenal insufficiency Secondary -2/18 A.m. cortisol= 2.8 (low) -2/18 ACTH =6.9  -2/19 renin/aldosterone pending -2/19 months ACTH returns will need to perform Cosyntropin Stim test. Pending -2/20 most likely has secondary adrenal insufficiency to verify will perform Cosyntropin test, if positive obtain MRI of pituitary gland/measure anterior Pituitary Hormones  obesity (BMI 30.05 kg/m) -Patient to discuss with PCP          Mobility Assessment (last 72 hours)     Mobility Assessment     Row Name 03/13/22 0742 03/12/22 1100 03/11/22 1500       Does patient have an order for  bedrest or is patient medically unstable No - Continue assessment No - Continue assessment No - Continue assessment     What is the highest level of mobility based on the progressive mobility assessment? Level 5 (Walks with assist in room/hall) - Balance while stepping forward/back and can walk in room with assist - Complete Level 5 (Walks with assist in room/hall) - Balance  while stepping forward/back and can walk in room with assist - Complete Level 5 (Walks with assist in room/hall) - Balance while stepping forward/back and can walk in room with assist - Complete                    DVT prophylaxis: Subcu heparin Code Status: Full Family Communication:  Status is: Inpatient    Dispo: The patient is from: Home              Anticipated d/c is to: Home              Anticipated d/c date is: 3 days              Patient currently is not medically stable to d/c.      Consultants:    Procedures/Significant Events:  2/17 CT abdomen pelvis W0 contrast Findings suggestive of cystitis.  Correlate with urinalysis. 2. Tiny hiatal hernia.  I have personally reviewed and interpreted all radiology studies and my findings are as above.  VENTILATOR SETTINGS:    Cultures 2/17 influenza A/B negative 2/17 SARS coronavirus negative 2/17 respiratory virus panel negative 2/17 blood LEFT hand NGTD   Antimicrobials: Anti-infectives (From admission, onward)    Start     Ordered Stop   03/12/22 0800  cefTRIAXone (ROCEPHIN) 2 g in sodium chloride 0.9 % 100 mL IVPB        03/11/22 1119 03/19/22 0759   03/11/22 1145  cefTRIAXone (ROCEPHIN) 1 g in sodium chloride 0.9 % 100 mL IVPB        03/11/22 1139 03/12/22 0526   03/11/22 0330  cefTRIAXone (ROCEPHIN) 1 g in sodium chloride 0.9 % 100 mL IVPB        03/11/22 0318 03/11/22 0500         Devices    LINES / TUBES:      Continuous Infusions:  cefTRIAXone (ROCEPHIN)  IV Stopped (03/13/22 2032)     Objective: Vitals:   03/13/22 1348 03/13/22 1700 03/13/22 2206 03/14/22 0641  BP: 131/86  121/67 122/79  Pulse: 76  73 67  Resp: 14  18 16  $ Temp:  98.5 F (36.9 C) 98.6 F (37 C) 98.5 F (36.9 C)  TempSrc:  Oral Oral Oral  SpO2: 98%  97% 100%  Weight:      Height:        Intake/Output Summary (Last 24 hours) at 03/14/2022 0802 Last data filed at 03/14/2022 0600 Gross per 24 hour   Intake 1338.92 ml  Output 825 ml  Net 513.92 ml    Filed Weights   03/11/22 0231  Weight: 95 kg   Physical Exam:  General: A/O x 4, No acute respiratory distress Eyes: negative scleral hemorrhage, negative anisocoria, negative icterus ENT: Negative Runny nose, negative gingival bleeding, Neck:  Negative scars, masses, torticollis, lymphadenopathy, JVD Lungs: Clear to auscultation bilaterally without wheezes or crackles Cardiovascular: Regular rate and rhythm without murmur gallop or rub normal S1 and S2 Abdomen: negative abdominal pain, nondistended, positive soft, bowel sounds, no rebound, no ascites, no appreciable mass Extremities: No significant cyanosis, clubbing,  or edema bilateral lower extremities Skin: Negative rashes, lesions, ulcers Psychiatric:  Negative depression, negative anxiety, negative fatigue, negative mania  Central nervous system:  Cranial nerves II through XII intact, tongue/uvula midline, all extremities muscle strength 5/5, sensation intact throughout, negative dysarthria, negative expressive aphasia, negative receptive aphasia.    .     Data Reviewed: Care during the described time interval was provided by me .  I have reviewed this patient's available data, including medical history, events of note, physical examination, and all test results as part of my evaluation.  CBC: Recent Labs  Lab 03/10/22 2258 03/12/22 0407 03/13/22 0418 03/14/22 0514  WBC 11.2* 8.1 7.1 7.6  NEUTROABS 8.3* 4.7 4.6 4.8  HGB 11.2* 9.3* 9.2* 9.2*  HCT 34.8* 29.6* 29.0* 29.1*  MCV 90.2 92.5 91.2 91.8  PLT 217 176 174 [id number]    Basic Metabolic Panel: Recent Labs  Lab 03/10/22 2258 03/11/22 0537 03/12/22 0407 03/13/22 0418 03/14/22 0514  NA 134* 140 142 140 139  K 3.4* 3.6 3.3* 2.9* 4.0  CL 103 108 108 108 107  CO2 23 25 25 $ 21* 22  GLUCOSE 398* 236* 177* 107* 176*  BUN 18 16 19 18 17  $ CREATININE 2.73* 2.39* 1.81* 1.57* 1.56*  CALCIUM 9.0 8.8* 9.1 8.9 8.9  MG   --   --  2.1 2.0 2.0  PHOS  --   --  3.7 3.3 3.4    GFR: Estimated Creatinine Clearance: 55.3 mL/min (A) (by C-G formula based on SCr of 1.56 mg/dL (H)). Liver Function Tests: Recent Labs  Lab 03/10/22 2258 03/12/22 0407 03/13/22 0418 03/14/22 0514  AST 24 20 19 22  $ ALT 35 27 28 29  $ ALKPHOS 67 50 51 60  BILITOT 0.6 0.4 0.6 0.6  PROT 6.9 5.4* 5.3* 5.7*  ALBUMIN 3.8 2.6* 2.7* 2.9*    Recent Labs  Lab 03/10/22 2258  LIPASE 40    No results for input(s): "AMMONIA" in the last 168 hours. Coagulation Profile: Recent Labs  Lab 03/12/22 0407  INR 1.0    Cardiac Enzymes: No results for input(s): "CKTOTAL", "CKMB", "CKMBINDEX", "TROPONINI" in the last 168 hours. BNP (last 3 results) No results for input(s): "PROBNP" in the last 8760 hours. HbA1C: No results for input(s): "HGBA1C" in the last 72 hours. CBG: Recent Labs  Lab 03/13/22 1626 03/13/22 2017 03/13/22 2356 03/14/22 0459 03/14/22 0747  GLUCAP 175* 197* 198* 163* 138*    Lipid Profile: Recent Labs    03/11/22 1340  CHOL 111  HDL 52  LDLCALC 49  TRIG 50  CHOLHDL 2.1    Thyroid Function Tests: No results for input(s): "TSH", "T4TOTAL", "FREET4", "T3FREE", "THYROIDAB" in the last 72 hours. Anemia Panel: No results for input(s): "VITAMINB12", "FOLATE", "FERRITIN", "TIBC", "IRON", "RETICCTPCT" in the last 72 hours. Sepsis Labs: Recent Labs  Lab 03/11/22 1340 03/11/22 1625 03/12/22 0407 03/13/22 0418  PROCALCITON 0.10  --  <0.10 <0.10  LATICACIDVEN 0.9 0.9  --   --      Recent Results (from the past 240 hour(s))  Culture, blood (Routine X 2) w Reflex to ID Panel     Status: None (Preliminary result)   Collection Time: 03/11/22  1:40 PM   Specimen: BLOOD LEFT HAND  Result Value Ref Range Status   Specimen Description   Final    BLOOD LEFT HAND Performed at [facility], [place] [address], [place], [place] [address]    Special Requests   Final    BOTTLES  DRAWN AEROBIC AND ANAEROBIC Blood  Culture results may not be optimal due to an inadequate volume of blood received in culture bottles Performed at [facility], [place] [address], [place], [place] [address]    Culture   Final    NO GROWTH 3 DAYS Performed at [facility], [place] [address] [place], [place] [address]    Report Status PENDING  Incomplete  Culture, blood (Routine X 2) w Reflex to ID Panel     Status: None (Preliminary result)   Collection Time: 03/11/22  1:40 PM   Specimen: BLOOD LEFT ARM  Result Value Ref Range Status   Specimen Description   Final    BLOOD LEFT ARM Performed at [facility], [place] [address] [place], [place] [address]    Special Requests   Final    BOTTLES DRAWN AEROBIC AND ANAEROBIC Blood Culture adequate volume Performed at [place] [address] [place], [place] [address]    Culture   Final    NO GROWTH 3 DAYS Performed at [facility], [place] [address], [place],  [address]    Report Status PENDING  Incomplete  Resp panel by RT-PCR (RSV, Flu A&B, Covid) Anterior Nasal Swab     Status: None   Collection Time: 03/11/22  5:28 PM   Specimen: Anterior Nasal Swab  Result Value Ref Range Status   SARS Coronavirus 2 by RT PCR NEGATIVE NEGATIVE Final    Comment: (NOTE) SARS-CoV-2 target nucleic acids are NOT DETECTED.  The SARS-CoV-2 RNA is generally detectable in upper respiratory specimens during the acute phase of infection. The lowest concentration of SARS-CoV-2 viral copies this assay can detect is 138 copies/mL. A negative result does not preclude SARS-Cov-2 infection and should not be used as the sole basis for treatment or other patient management decisions. A negative result may occur with  improper specimen collection/handling, submission of specimen other than nasopharyngeal swab, presence of viral mutation(s) within the areas targeted by this assay, and inadequate number of viral copies(<138 copies/mL). A  negative result must be combined with clinical observations, patient history, and epidemiological information. The expected result is Negative.  Fact Sheet for Patients:  EntrepreneurPulse.com.au  Fact Sheet for Healthcare Providers:  IncredibleEmployment.be  This test is no t yet approved or cleared by the [place] FDA and  has been authorized for detection and/or diagnosis of SARS-CoV-2 by FDA under an Emergency Use Authorization (EUA). This EUA will remain  in effect (meaning this test can be used) for the duration of the COVID-19 declaration under Section 564(b)(1) of the Act, 21 U.S.C.section 360bbb-3(b)(1), unless the authorization is terminated  or revoked sooner.       Influenza A by PCR NEGATIVE NEGATIVE Final   Influenza B by PCR NEGATIVE NEGATIVE Final    Comment: (NOTE) The Xpert Xpress SARS-CoV-2/FLU/RSV plus assay is intended as an aid in the diagnosis of influenza from Nasopharyngeal swab specimens and should not be used as a sole basis for treatment. Nasal washings and aspirates are unacceptable for Xpert Xpress SARS-CoV-2/FLU/RSV testing.  Fact Sheet for Patients: EntrepreneurPulse.com.au  Fact Sheet for Healthcare Providers: IncredibleEmployment.be  This test is not yet approved or cleared by the [place] FDA and has been authorized for detection and/or diagnosis of SARS-CoV-2 by FDA under an Emergency Use Authorization (EUA). This EUA will remain in effect (meaning this test can be used) for the duration of the COVID-19 declaration under Section 564(b)(1) of the  Act, 21 U.S.C. section 360bbb-3(b)(1), unless the authorization is terminated or revoked.     Resp Syncytial Virus by PCR NEGATIVE NEGATIVE Final    Comment: (NOTE) Fact Sheet for Patients: EntrepreneurPulse.com.au  Fact Sheet for Healthcare  Providers: IncredibleEmployment.be  This test is not yet approved or cleared by the [place] FDA and has been authorized for detection and/or diagnosis of SARS-CoV-2 by FDA under an Emergency Use Authorization (EUA). This EUA will remain in effect (meaning this test can be used) for the duration of the COVID-19 declaration under Section 564(b)(1) of the Act, 21 U.S.C. section 360bbb-3(b)(1), unless the authorization is terminated or revoked.  Performed at [facility], [place] [address], [place], [place] [address]   Respiratory (~20 pathogens) panel by PCR     Status: None   Collection Time: 03/11/22  5:28 PM   Specimen: Nasopharyngeal Swab; Respiratory  Result Value Ref Range Status   Adenovirus NOT DETECTED NOT DETECTED Final   Coronavirus 229E NOT DETECTED NOT DETECTED Final    Comment: (NOTE) The Coronavirus on the Respiratory Panel, DOES NOT test for the novel  Coronavirus (2019 nCoV)    Coronavirus HKU1 NOT DETECTED NOT DETECTED Final   Coronavirus NL63 NOT DETECTED NOT DETECTED Final   Coronavirus OC43 NOT DETECTED NOT DETECTED Final   Metapneumovirus NOT DETECTED NOT DETECTED Final   Rhinovirus / Enterovirus NOT DETECTED NOT DETECTED Final   Influenza A NOT DETECTED NOT DETECTED Final   Influenza B NOT DETECTED NOT DETECTED Final   Parainfluenza Virus 1 NOT DETECTED NOT DETECTED Final   Parainfluenza Virus 2 NOT DETECTED NOT DETECTED Final   Parainfluenza Virus 3 NOT DETECTED NOT DETECTED Final   Parainfluenza Virus 4 NOT DETECTED NOT DETECTED Final   Respiratory Syncytial Virus NOT DETECTED NOT DETECTED Final   Bordetella pertussis NOT DETECTED NOT DETECTED Final   Bordetella Parapertussis NOT DETECTED NOT DETECTED Final   Chlamydophila pneumoniae NOT DETECTED NOT DETECTED Final   Mycoplasma pneumoniae NOT DETECTED NOT DETECTED Final    Comment: Performed at [facility], [place]. [address], [place], [place] [address]  Urine  Culture     Status: None   Collection Time: 03/12/22 12:21 PM   Specimen: Urine, Random  Result Value Ref Range Status   Specimen Description   Final    URINE, RANDOM Performed at [place] [address], [place], [place] [address]    Special Requests URINE, CLEAN CATCH  Final   Culture   Final    NO GROWTH Performed at [facility], [place] [address], [place], [place] [address]    Report Status 03/13/2022 FINAL  Final         Radiology Studies: No results found.      Scheduled Meds:  aspirin EC  81 mg Oral Daily   Chlorhexidine Gluconate Cloth  6 each Topical Daily   docusate sodium  100 mg Oral BID   heparin  5,000 Units Subcutaneous Q8H   insulin aspart  0-15 Units Subcutaneous Q4H   Ensure Max Protein  11 oz Oral Daily   rosuvastatin  20 mg Oral Daily   senna  1 tablet Oral BID   tadalafil  5 mg Oral Daily   tamsulosin  0.4 mg Oral BID   Continuous Infusions:  cefTRIAXone (ROCEPHIN)  IV Stopped (03/13/22 2032)     LOS: 2 days    Time spent:40 min    Jamicheal Heard, Geraldo Docker, MD [facility]  If 7PM-7AM, please contact night-coverage 03/14/2022, 8:02 AM

## 2022-03-15 ENCOUNTER — Inpatient Hospital Stay (HOSPITAL_COMMUNITY): Payer: Medicaid Other

## 2022-03-15 DIAGNOSIS — R338 Other retention of urine: Secondary | ICD-10-CM | POA: Diagnosis not present

## 2022-03-15 LAB — COMPREHENSIVE METABOLIC PANEL WITH GFR
ALT: 32 U/L (ref 0–44)
AST: 27 U/L (ref 15–41)
Albumin: 2.9 g/dL — ABNORMAL LOW (ref 3.5–5.0)
Alkaline Phosphatase: 67 U/L (ref 38–126)
Anion gap: 6 (ref 5–15)
BUN: 17 mg/dL (ref 8–23)
CO2: 23 mmol/L (ref 22–32)
Calcium: 8.8 mg/dL — ABNORMAL LOW (ref 8.9–10.3)
Chloride: 111 mmol/L (ref 98–111)
Creatinine, Ser: 1.79 mg/dL — ABNORMAL HIGH (ref 0.61–1.24)
GFR, Estimated: 42 mL/min — ABNORMAL LOW
Glucose, Bld: 139 mg/dL — ABNORMAL HIGH (ref 70–99)
Potassium: 4.1 mmol/L (ref 3.5–5.1)
Sodium: 140 mmol/L (ref 135–145)
Total Bilirubin: 0.4 mg/dL (ref 0.3–1.2)
Total Protein: 6.1 g/dL — ABNORMAL LOW (ref 6.5–8.1)

## 2022-03-15 LAB — CBC WITH DIFFERENTIAL/PLATELET
Abs Immature Granulocytes: 0.08 10*3/uL — ABNORMAL HIGH (ref 0.00–0.07)
Basophils Absolute: 0 10*3/uL (ref 0.0–0.1)
Basophils Relative: 0 %
Eosinophils Absolute: 0.2 10*3/uL (ref 0.0–0.5)
Eosinophils Relative: 2 %
HCT: 28.9 % — ABNORMAL LOW (ref 39.0–52.0)
Hemoglobin: 8.8 g/dL — ABNORMAL LOW (ref 13.0–17.0)
Immature Granulocytes: 1 %
Lymphocytes Relative: 23 %
Lymphs Abs: 1.8 10*3/uL (ref 0.7–4.0)
MCH: 28.3 pg (ref 26.0–34.0)
MCHC: 30.4 g/dL (ref 30.0–36.0)
MCV: 92.9 fL (ref 80.0–100.0)
Monocytes Absolute: 0.5 10*3/uL (ref 0.1–1.0)
Monocytes Relative: 6 %
Neutro Abs: 5.2 10*3/uL (ref 1.7–7.7)
Neutrophils Relative %: 68 %
Platelets: 187 10*3/uL (ref 150–400)
RBC: 3.11 MIL/uL — ABNORMAL LOW (ref 4.22–5.81)
RDW: 14.9 % (ref 11.5–15.5)
WBC: 7.7 10*3/uL (ref 4.0–10.5)
nRBC: 0 % (ref 0.0–0.2)

## 2022-03-15 LAB — GLUCOSE, CAPILLARY
Glucose-Capillary: 117 mg/dL — ABNORMAL HIGH (ref 70–99)
Glucose-Capillary: 123 mg/dL — ABNORMAL HIGH (ref 70–99)
Glucose-Capillary: 127 mg/dL — ABNORMAL HIGH (ref 70–99)
Glucose-Capillary: 128 mg/dL — ABNORMAL HIGH (ref 70–99)
Glucose-Capillary: 141 mg/dL — ABNORMAL HIGH (ref 70–99)
Glucose-Capillary: 141 mg/dL — ABNORMAL HIGH (ref 70–99)
Glucose-Capillary: 162 mg/dL — ABNORMAL HIGH (ref 70–99)

## 2022-03-15 LAB — MAGNESIUM: Magnesium: 2.2 mg/dL (ref 1.7–2.4)

## 2022-03-15 LAB — ACTH STIMULATION, 3 TIME POINTS
Cortisol, 30 Min: 14.3 ug/dL
Cortisol, 60 Min: 17.1 ug/dL
Cortisol, Base: 8.5 ug/dL

## 2022-03-15 LAB — PHOSPHORUS: Phosphorus: 4.2 mg/dL (ref 2.5–4.6)

## 2022-03-15 NOTE — Progress Notes (Signed)
PROGRESS NOTE    Teren Nhan  [id number] DOB: 1955/07/27 DOA: 03/10/2022 PCP: Camillia Herter, NP   Brief Narrative: 67 y.o.BM PMHx DM type II uncontrolled with hyperglycemia, HTN, LVH, HLD, CKD stage III (baseline Cr 1.4), BPH, CVA admitted with constipation and unable to urinate.  Assessment & Plan:   Principal Problem:   Acute retention of urine Active Problems:   LVH (left ventricular hypertrophy)   Ascending aortic aneurysm (HCC)   CVA (cerebral vascular accident) ([place])   Stage 3 chronic kidney disease (HCC)   BPH (benign prostatic hyperplasia)   Benign essential HTN   Uncontrolled type 2 diabetes mellitus with hyperglycemia, with long-term current use of insulin ([place])   Acute urinary retention   Obesity (BMI 30-39.9)  #1 acute urinary retention secondary to benign prostatic hypertrophy had Foley catheter placed which has been taken out on the 19th now he is able to urinate without difficulty.  Outpatient follow-up with urology.  Continue Flomax. Encouraged him to ambulate.  #2 CKD stage IIIa creatinine 1.7 from 1.5-follow-up labs in AM.  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  ACTH was also low. MRI of the brain to rule out pituitary adenoma.  #4 hypertension blood pressure 145/87  #5 uncontrolled type 2 diabetes-CBG (last 3)  Recent Labs    03/15/22 0422 03/15/22 0728 03/15/22 1130  GLUCAP 117* 127* 141*     #6 hyperlipidemia on Crestor  #7 hypokalemia resolved   Estimated body mass index is 30.05 kg/m as calculated from the following:   Height as of this encounter: 5' 10"$  (1.778 m).   Weight as of this encounter: 95 kg.  DVT prophylaxis: Heparin Code Status: Full Family Communication: None at bedside Disposition Plan:  Status is: Inpatient Remains inpatient  appropriate because: MRI brain pending   Consultants: None  Procedures: none Antimicrobials: rocephin  Subjective: C/o nausea and feeling sick to stomach after eating denies diarrhea or abdominal pain  Objective: Vitals:   03/14/22 1209 03/14/22 2229 03/15/22 0508 03/15/22 1339  BP: (!) 148/87 117/76 127/81 (!) 145/87  Pulse: 65 74 68 66  Resp: 16 17 17 18  $ Temp: 98.1 F (36.7 C) 98.2 F (36.8 C) 98.2 F (36.8 C) 98 F (36.7 C)  TempSrc: Oral Oral Oral Oral  SpO2: 99% 92% 97% 96%  Weight:      Height:        Intake/Output Summary (Last 24 hours) at 03/15/2022 1447 Last data filed at 03/15/2022 1400 Gross per 24 hour  Intake 640 ml  Output 450 ml  Net 190 ml   Filed Weights   03/11/22 0231  Weight: 95 kg    Examination:  General exam: Appears in no acute distress Respiratory system: Clear to auscultation. Respiratory effort normal. Cardiovascular system: S1 & S2 heard, RRR. No JVD, murmurs, rubs, gallops or clicks. No pedal edema. Gastrointestinal system: Abdomen is nondistended, soft and nontender. No organomegaly or masses felt. Normal bowel sounds heard. Central nervous system: Alert and oriented. No focal neurological deficits. Extremities: No edema. Skin: No rashes, lesions or ulcers Psychiatry: Judgement and insight appear normal. Mood & affect appropriate.     Data Reviewed: I have personally reviewed following labs and imaging studies  CBC: Recent Labs  Lab 03/10/22 2258 03/12/22 0407 03/13/22 0418 03/14/22 0514 03/15/22 0713  WBC 11.2* 8.1 7.1 7.6 7.7  NEUTROABS 8.3* 4.7 4.6 4.8 5.2  HGB 11.2* 9.3* 9.2* 9.2* 8.8*  HCT 34.8* 29.6* 29.0* 29.1* 28.9*  MCV 90.2 92.5 91.2 91.8 92.9  PLT 217 176 174 173 [id number]   Basic Metabolic Panel: Recent Labs  Lab 03/11/22 0537 03/12/22 0407 03/13/22 0418 03/14/22 0514 03/15/22 0713  NA 140 142 140 139 140  K 3.6 3.3* 2.9* 4.0 4.1  CL 108 108 108 107 111  CO2 25 25 21* 22 23  GLUCOSE 236* 177* 107*  176* 139*  BUN 16 19 18 17 17  $ CREATININE 2.39* 1.81* 1.57* 1.56* 1.79*  CALCIUM 8.8* 9.1 8.9 8.9 8.8*  MG  --  2.1 2.0 2.0 2.2  PHOS  --  3.7 3.3 3.4 4.2   GFR: Estimated Creatinine Clearance: 48.2 mL/min (A) (by C-G formula based on SCr of 1.79 mg/dL (H)). Liver Function Tests: Recent Labs  Lab 03/10/22 2258 03/12/22 0407 03/13/22 0418 03/14/22 0514 03/15/22 0713  AST 24 20 19 22 27  $ ALT 35 27 28 29 $ 32  ALKPHOS 67 50 51 60 67  BILITOT 0.6 0.4 0.6 0.6 0.4  PROT 6.9 5.4* 5.3* 5.7* 6.1*  ALBUMIN 3.8 2.6* 2.7* 2.9* 2.9*   Recent Labs  Lab 03/10/22 2258  LIPASE 40   No results for input(s): "AMMONIA" in the last 168 hours. Coagulation Profile: Recent Labs  Lab 03/12/22 0407  INR 1.0   Cardiac Enzymes: No results for input(s): "CKTOTAL", "CKMB", "CKMBINDEX", "TROPONINI" in the last 168 hours. BNP (last 3 results) No results for input(s): "PROBNP" in the last 8760 hours. HbA1C: No results for input(s): "HGBA1C" in the last 72 hours. CBG: Recent Labs  Lab 03/14/22 2015 03/15/22 0024 03/15/22 0422 03/15/22 0728 03/15/22 1130  GLUCAP 170* 123* 117* 127* 141*   Lipid Profile: No results for input(s): "CHOL", "HDL", "LDLCALC", "TRIG", "CHOLHDL", "LDLDIRECT" in the last 72 hours. Thyroid Function Tests: No results for input(s): "TSH", "T4TOTAL", "FREET4", "T3FREE", "THYROIDAB" in the last 72 hours. Anemia Panel: No results for input(s): "VITAMINB12", "FOLATE", "FERRITIN", "TIBC", "IRON", "RETICCTPCT" in the last 72 hours. Sepsis Labs: Recent Labs  Lab 03/11/22 1340 03/11/22 1625 03/12/22 0407 03/13/22 0418  PROCALCITON 0.10  --  <0.10 <0.10  LATICACIDVEN 0.9 0.9  --   --     Recent Results (from the past 240 hour(s))  Culture, blood (Routine X 2) w Reflex to ID Panel     Status: None (Preliminary result)   Collection Time: 03/11/22  1:40 PM   Specimen: BLOOD LEFT HAND  Result Value Ref Range Status   Specimen Description   Final    BLOOD LEFT  HAND Performed at [facility], [place] [address], [place], [place] [address]    Special Requests   Final    BOTTLES DRAWN AEROBIC AND ANAEROBIC Blood Culture results may not be optimal due to an inadequate volume of blood received in culture bottles Performed at [facility] [address], [place], [place] [address]    Culture   Final    NO GROWTH 4 DAYS Performed at [facility], [place] [address] [place], [place] [address]    Report Status PENDING  Incomplete  Culture, blood (Routine X 2) w Reflex to ID Panel     Status: None (Preliminary result)   Collection Time: 03/11/22  1:40 PM   Specimen: BLOOD LEFT ARM  Result Value Ref Range Status   Specimen Description   Final    BLOOD LEFT ARM Performed  at [facility], [place] [address] [place], [place] [address]    Special Requests   Final    BOTTLES DRAWN AEROBIC AND ANAEROBIC Blood Culture adequate volume Performed at [facility] [address] [place], [place] [address]    Culture   Final    NO GROWTH 4 DAYS Performed at [facility], [place] [address], [place], [place] [address]    Report Status PENDING  Incomplete  Resp panel by RT-PCR (RSV, Flu A&B, Covid) Anterior Nasal Swab     Status: None   Collection Time: 03/11/22  5:28 PM   Specimen: Anterior Nasal Swab  Result Value Ref Range Status   SARS Coronavirus 2 by RT PCR NEGATIVE NEGATIVE Final    Comment: (NOTE) SARS-CoV-2 target nucleic acids are NOT DETECTED.  The SARS-CoV-2 RNA is generally detectable in upper respiratory specimens during the acute phase of infection. The lowest concentration of SARS-CoV-2 viral copies this assay can detect is 138 copies/mL. A negative result does not preclude SARS-Cov-2 infection and should not be used as the sole basis for treatment or other patient management decisions. A negative result may occur with  improper specimen collection/handling, submission of specimen  other than nasopharyngeal swab, presence of viral mutation(s) within the areas targeted by this assay, and inadequate number of viral copies(<138 copies/mL). A negative result must be combined with clinical observations, patient history, and epidemiological information. The expected result is Negative.  Fact Sheet for Patients:  EntrepreneurPulse.com.au  Fact Sheet for Healthcare Providers:  IncredibleEmployment.be  This test is no t yet approved or cleared by the [place] FDA and  has been authorized for detection and/or diagnosis of SARS-CoV-2 by FDA under an Emergency Use Authorization (EUA). This EUA will remain  in effect (meaning this test can be used) for the duration of the COVID-19 declaration under Section 564(b)(1) of the Act, 21 U.S.C.section 360bbb-3(b)(1), unless the authorization is terminated  or revoked sooner.       Influenza A by PCR NEGATIVE NEGATIVE Final   Influenza B by PCR NEGATIVE NEGATIVE Final    Comment: (NOTE) The Xpert Xpress SARS-CoV-2/FLU/RSV plus assay is intended as an aid in the diagnosis of influenza from Nasopharyngeal swab specimens and should not be used as a sole basis for treatment. Nasal washings and aspirates are unacceptable for Xpert Xpress SARS-CoV-2/FLU/RSV testing.  Fact Sheet for Patients: EntrepreneurPulse.com.au  Fact Sheet for Healthcare Providers: IncredibleEmployment.be  This test is not yet approved or cleared by the [place] FDA and has been authorized for detection and/or diagnosis of SARS-CoV-2 by FDA under an Emergency Use Authorization (EUA). This EUA will remain in effect (meaning this test can be used) for the duration of the COVID-19 declaration under Section 564(b)(1) of the Act, 21 U.S.C. section 360bbb-3(b)(1), unless the authorization is terminated or revoked.     Resp Syncytial Virus by PCR NEGATIVE NEGATIVE Final     Comment: (NOTE) Fact Sheet for Patients: EntrepreneurPulse.com.au  Fact Sheet for Healthcare Providers: IncredibleEmployment.be  This test is not yet approved or cleared by the [place] FDA and has been authorized for detection and/or diagnosis of SARS-CoV-2 by FDA under an Emergency Use Authorization (EUA). This EUA will remain in effect (meaning this test can be used) for the duration of the COVID-19 declaration under Section 564(b)(1) of the Act, 21 U.S.C. section 360bbb-3(b)(1), unless the authorization is terminated or revoked.  Performed at [facility], [place] [address], [place], [place] [address]  Respiratory (~20 pathogens) panel by PCR     Status: None   Collection Time: 03/11/22  5:28 PM   Specimen: Nasopharyngeal Swab; Respiratory  Result Value Ref Range Status   Adenovirus NOT DETECTED NOT DETECTED Final   Coronavirus 229E NOT DETECTED NOT DETECTED Final    Comment: (NOTE) The Coronavirus on the Respiratory Panel, DOES NOT test for the novel  Coronavirus (2019 nCoV)    Coronavirus HKU1 NOT DETECTED NOT DETECTED Final   Coronavirus NL63 NOT DETECTED NOT DETECTED Final   Coronavirus OC43 NOT DETECTED NOT DETECTED Final   Metapneumovirus NOT DETECTED NOT DETECTED Final   Rhinovirus / Enterovirus NOT DETECTED NOT DETECTED Final   Influenza A NOT DETECTED NOT DETECTED Final   Influenza B NOT DETECTED NOT DETECTED Final   Parainfluenza Virus 1 NOT DETECTED NOT DETECTED Final   Parainfluenza Virus 2 NOT DETECTED NOT DETECTED Final   Parainfluenza Virus 3 NOT DETECTED NOT DETECTED Final   Parainfluenza Virus 4 NOT DETECTED NOT DETECTED Final   Respiratory Syncytial Virus NOT DETECTED NOT DETECTED Final   Bordetella pertussis NOT DETECTED NOT DETECTED Final   Bordetella Parapertussis NOT DETECTED NOT DETECTED Final   Chlamydophila pneumoniae NOT DETECTED NOT DETECTED Final   Mycoplasma pneumoniae NOT DETECTED NOT  DETECTED Final    Comment: Performed at [facility], [place]. [address], [place], [place] [address]  Urine Culture     Status: None   Collection Time: 03/12/22 12:21 PM   Specimen: Urine, Random  Result Value Ref Range Status   Specimen Description   Final    URINE, RANDOM Performed at [facility] [address], [place], [place] [address]    Special Requests URINE, CLEAN CATCH  Final   Culture   Final    NO GROWTH Performed at [facility], [place] [address], [place], [place] [address]    Report Status 03/13/2022 FINAL  Final      Radiology Studies: No results found.   Scheduled Meds:  aspirin EC  81 mg Oral Daily   Chlorhexidine Gluconate Cloth  6 each Topical Daily   docusate sodium  100 mg Oral BID   heparin  5,000 Units Subcutaneous Q8H   insulin aspart  0-15 Units Subcutaneous Q4H   Ensure Max Protein  11 oz Oral Daily   rosuvastatin  20 mg Oral Daily   senna  1 tablet Oral BID   tadalafil  5 mg Oral Daily   tamsulosin  0.4 mg Oral BID   Continuous Infusions:  cefTRIAXone (ROCEPHIN)  IV 2 g (03/15/22 0918)     LOS: 3 days   Time spent: 38 min  Georgette Shell, MD  03/15/2022, 2:47 PM

## 2022-03-16 LAB — COMPREHENSIVE METABOLIC PANEL
ALT: 36 U/L (ref 0–44)
AST: 32 U/L (ref 15–41)
Albumin: 3.2 g/dL — ABNORMAL LOW (ref 3.5–5.0)
Alkaline Phosphatase: 64 U/L (ref 38–126)
Anion gap: 7 (ref 5–15)
BUN: 17 mg/dL (ref 8–23)
CO2: 24 mmol/L (ref 22–32)
Calcium: 9 mg/dL (ref 8.9–10.3)
Chloride: 108 mmol/L (ref 98–111)
Creatinine, Ser: 1.74 mg/dL — ABNORMAL HIGH (ref 0.61–1.24)
GFR, Estimated: 43 mL/min — ABNORMAL LOW (ref >60)
Glucose, Bld: 158 mg/dL — ABNORMAL HIGH (ref 70–99)
Potassium: 3.7 mmol/L (ref 3.5–5.1)
Sodium: 139 mmol/L (ref 135–145)
Total Bilirubin: 0.5 mg/dL (ref 0.3–1.2)
Total Protein: 6.1 g/dL — ABNORMAL LOW (ref 6.5–8.1)

## 2022-03-16 LAB — CBC WITH DIFFERENTIAL/PLATELET
Abs Immature Granulocytes: 0.06 10*3/uL (ref 0.00–0.07)
Basophils Absolute: 0 10*3/uL (ref 0.0–0.1)
Basophils Relative: 0 %
Eosinophils Absolute: 0.1 10*3/uL (ref 0.0–0.5)
Eosinophils Relative: 2 %
HCT: 28.2 % — ABNORMAL LOW (ref 39.0–52.0)
Hemoglobin: 9 g/dL — ABNORMAL LOW (ref 13.0–17.0)
Immature Granulocytes: 1 %
Lymphocytes Relative: 24 %
Lymphs Abs: 1.8 10*3/uL (ref 0.7–4.0)
MCH: 29.1 pg (ref 26.0–34.0)
MCHC: 31.9 g/dL (ref 30.0–36.0)
MCV: 91.3 fL (ref 80.0–100.0)
Monocytes Absolute: 0.5 10*3/uL (ref 0.1–1.0)
Monocytes Relative: 6 %
Neutro Abs: 5.1 10*3/uL (ref 1.7–7.7)
Neutrophils Relative %: 67 %
Platelets: 194 10*3/uL (ref 150–400)
RBC: 3.09 MIL/uL — ABNORMAL LOW (ref 4.22–5.81)
RDW: 14.6 % (ref 11.5–15.5)
WBC: 7.6 10*3/uL (ref 4.0–10.5)
nRBC: 0 % (ref 0.0–0.2)

## 2022-03-16 LAB — GLUCOSE, CAPILLARY
Glucose-Capillary: 154 mg/dL — ABNORMAL HIGH (ref 70–99)
Glucose-Capillary: 97 mg/dL (ref 70–99)

## 2022-03-16 LAB — CULTURE, BLOOD (ROUTINE X 2)
Culture: NO GROWTH
Culture: NO GROWTH
Special Requests: ADEQUATE

## 2022-03-16 LAB — PHOSPHORUS: Phosphorus: 3.7 mg/dL (ref 2.5–4.6)

## 2022-03-16 LAB — MAGNESIUM: Magnesium: 2.8 mg/dL — ABNORMAL HIGH (ref 1.7–2.4)

## 2022-03-16 MED ORDER — SENNA 8.6 MG PO TABS: 1.0000 | ORAL_TABLET | Freq: Two times a day (BID) | ORAL | 0 refills | Status: DC

## 2022-03-16 MED ORDER — ONDANSETRON HCL 4 MG PO TABS: 4.0000 mg | ORAL_TABLET | Freq: Four times a day (QID) | ORAL | 0 refills | Status: DC | PRN

## 2022-03-16 MED ORDER — DOCUSATE SODIUM 100 MG PO CAPS: 100.0000 mg | ORAL_CAPSULE | Freq: Two times a day (BID) | ORAL | 0 refills | Status: DC

## 2022-03-16 MED ORDER — ACETAMINOPHEN 325 MG PO TABS: 650.0000 mg | ORAL_TABLET | Freq: Four times a day (QID) | ORAL | Status: DC | PRN

## 2022-03-16 MED ORDER — LACTULOSE 20 GM/30ML PO SOLN: 10.0000 g | Freq: Two times a day (BID) | ORAL | Status: DC | PRN

## 2022-03-16 NOTE — Discharge Summary (Signed)
Physician Discharge Summary  Curtis Saunders [id number] DOB: 09/06/1955 DOA: 03/10/2022  PCP: Camillia Herter, NP  Admit date: 03/10/2022 Discharge date: 03/16/2022  Admitted From: Home  Disposition: Home Recommendations for Outpatient Follow-up:  Follow up with PCP in 1-2 weeks.  Please note that Norvasc and ramipril were stopped and Flomax twice a day was started in the hospital.  His blood pressure remained normal to soft during the hospital stay.  You may restart Norvasc and ramipril as needed as outpatient if needed.  Ramipril was also stopped due to AKI. Please obtain BMP/CBC in one week Please follow up with urology  Home Health: None Equipment/Devices: None  Discharge Condition: Stable CODE STATUS: Full code Diet recommendation: Cardiac Brief/Interim Summary: 67 y.o.BM PMHx DM type II uncontrolled with hyperglycemia, HTN, LVH, HLD, CKD stage III (baseline Cr 1.4), BPH, CVA admitted with constipation and unable to urinate.    Discharge Diagnoses:  Principal Problem:   Acute retention of urine Active Problems:   LVH (left ventricular hypertrophy)   Ascending aortic aneurysm (HCC)   CVA (cerebral vascular accident) ([place])   Stage 3 chronic kidney disease (HCC)   BPH (benign prostatic hyperplasia)   Benign essential HTN   Uncontrolled type 2 diabetes mellitus with hyperglycemia, with long-term current use of insulin ([place])   Acute urinary retention   Obesity (BMI 30-39.9)      #1 acute urinary retention secondary to benign prostatic hypertrophy had Foley catheter placed which has been taken out on the 19th now he is able to urinate without difficulty.  Outpatient follow-up with urology.  Continue Flomax.   #2 CKD stage IIIa creatinine 1.74 on discharge.    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  ACTH was also low. MRI of the brain did not reveal any pituitary adenomas.  However he had small vessel ischemic disease and old basal ganglia infarct.   #4 hypertension blood pressure 145/87 he was started on Flomax.  Stopped Norvasc and ramipril.   #5 uncontrolled type 2 diabetes-CBG (last 3)  Recent Labs (last 2 labs)       Recent Labs    03/15/22 0422 03/15/22 0728 03/15/22 1130  GLUCAP 117* 127* 141*          #6 hyperlipidemia on Crestor   #7 hypokalemia resolved    Estimated body mass index is 30.05 kg/m as calculated from the following:   Height as of this encounter: 5' 10"$  (1.778 m).   Weight as of this encounter: 95 kg.  Discharge Instructions  Discharge Instructions     Diet - low sodium heart healthy   Complete by: As directed    Increase activity slowly   Complete by: As directed       Allergies as of 03/16/2022       Reactions   Bee Venom Anaphylaxis        Medication List     STOP taking these medications    amLODipine 10 MG tablet Commonly known as: NORVASC   ramipril 10 MG capsule Commonly known as: ALTACE       TAKE these medications    Accu-Chek Aviva Plus w/Device Kit 1 each by Does not apply route 4 (four) times daily -  before meals and at bedtime.   Accu-Chek [employer] Kit 1 each by Does not apply route 4 (four)  times daily -  before meals and at bedtime.   Accu-Chek FastClix Lancets Misc Apply topically.   Accu-Chek Guide test strip Generic drug: glucose blood Use as instructed   acetaminophen 325 MG tablet Commonly known as: TYLENOL Take 2 tablets (650 mg total) by mouth every 6 (six) hours as needed for mild pain (or Fever >/= 101).   aspirin EC 81 MG tablet Take 1 tablet (81 mg total) by mouth daily. Swallow whole.   blood glucose meter kit and supplies Dispense based on patient and insurance preference. Use up to four times daily as directed. (FOR ICD-10 E10.9, E11.9).   docusate  sodium 100 MG capsule Commonly known as: COLACE Take 1 capsule (100 mg total) by mouth 2 (two) times daily.   insulin detemir 100 UNIT/ML FlexPen Commonly known as: LEVEMIR Inject 20 Units into the skin 2 (two) times daily.   Lactulose 20 GM/30ML Soln Take 15 mLs (10 g total) by mouth every 12 (twelve) hours as needed.   ondansetron 4 MG tablet Commonly known as: ZOFRAN Take 1 tablet (4 mg total) by mouth every 6 (six) hours as needed for nausea.   Pen Needles 3/16" 31G X 5 MM Misc 20 Units by Does not apply route 2 (two) times daily.   rosuvastatin 20 MG tablet Commonly known as: CRESTOR Take 1 tablet by mouth once daily   senna 8.6 MG Tabs tablet Commonly known as: SENOKOT Take 1 tablet (8.6 mg total) by mouth 2 (two) times daily.   tamsulosin 0.4 MG Caps capsule Commonly known as: FLOMAX Take 1 capsule (0.4 mg total) by mouth daily.        Follow-up Information     Camillia Herter, NP Follow up.   Specialty: Nurse Practitioner Why: Please check BMP in 1 week to monitor his renal functions and make sure they are stable Contact information: [place] [address] [phone]                Allergies  Allergen Reactions   Bee Venom Anaphylaxis    Consultations: none   Procedures/Studies: MR BRAIN WO CONTRAST  Result Date: 03/15/2022 CLINICAL DATA:  New onset headache EXAM: MRI HEAD WITHOUT CONTRAST TECHNIQUE: Multiplanar, multiecho pulse sequences of the brain and surrounding structures were obtained without intravenous contrast. COMPARISON:  02/27/2020 FINDINGS: Brain: No acute infarct, mass effect or extra-axial collection. Fewer than 5 scattered microhemorrhages in a nonspecific pattern. There is multifocal hyperintense T2-weighted signal within the white matter. Parenchymal volume and CSF spaces are normal. Old left basal ganglia small vessel infarcts. The midline structures are normal. Vascular: Major flow voids are  preserved. Skull and upper cervical spine: Normal calvarium and skull base. Visualized upper cervical spine and soft tissues are normal. Sinuses/Orbits:No paranasal sinus fluid levels or advanced mucosal thickening. No mastoid or middle ear effusion. Normal orbits. IMPRESSION: 1. No acute intracranial abnormality. 2. Old left basal ganglia small vessel infarcts and findings of chronic small vessel ischemia. Electronically Signed   By: Ulyses Jarred M.D.   On: 03/15/2022 23:35   CT ABDOMEN PELVIS WO CONTRAST  Result Date: 03/11/2022 CLINICAL DATA:  abdominal pain and distention. Pt complains of diffuse abdominal pain. Patient has not had a bowel movement for 7 days. He has tried laxative OTC with no relief. Patient reports increased abdominal distention in the last 7 days. No fever, nausea, vomiting EXAM: CT ABDOMEN AND PELVIS WITHOUT CONTRAST TECHNIQUE: Multidetector CT imaging of the abdomen and pelvis was  performed following the standard protocol without IV contrast. RADIATION DOSE REDUCTION: This exam was performed according to the departmental dose-optimization program which includes automated exposure control, adjustment of the mA and/or kV according to patient size and/or use of iterative reconstruction technique. COMPARISON:  CT abdomen pelvis 07/22/2018 FINDINGS: Lower chest: Trace pericardial effusion. Tiny hiatal hernia. No acute abnormality. Hepatobiliary: No focal liver abnormality. No gallstones, gallbladder wall thickening, or pericholecystic fluid. No biliary dilatation. Pancreas: No focal lesion. Normal pancreatic contour. No surrounding inflammatory changes. No main pancreatic ductal dilatation. Spleen: Normal in size without focal abnormality.  Splenule noted. Adrenals/Urinary Tract: No adrenal nodule bilaterally. No nephrolithiasis and no hydronephrosis. Fluid density lesions within the right kidney likely represent simple renal cysts. Simple renal cysts, in the absence of clinically indicated  signs/symptoms, require no independent follow-up. No ureterolithiasis or hydroureter. Perivesicular fat stranding. Stomach/Bowel: Stomach is within normal limits. No evidence of bowel wall thickening or dilatation. Status post appendectomy. Vascular/Lymphatic: No abdominal aorta or iliac aneurysm. Mild atherosclerotic plaque of the aorta and its branches. No abdominal, pelvic, or inguinal lymphadenopathy. Reproductive: Prominent prostate measuring up to 4.7 cm. Other: No intraperitoneal free fluid. No intraperitoneal free gas. No organized fluid collection. Musculoskeletal: Small fat containing umbilical hernia. No suspicious lytic or blastic osseous lesions. No acute displaced fracture. IMPRESSION: 1. Findings suggestive of cystitis.  Correlate with urinalysis. 2. Tiny hiatal hernia. Electronically Signed   By: Iven Finn M.D.   On: 03/11/2022 01:09   (Echo, Carotid, EGD, Colonoscopy, ERCP)    Subjective: No new complaints  Slept well no headaches  Discharge Exam: Vitals:   03/15/22 1947 03/16/22 0450  BP: (!) 146/82 137/74  Pulse: 65 61  Resp: 16 16  Temp: 98.1 F (36.7 C) 98.3 F (36.8 C)  SpO2: 99% 98%   Vitals:   03/15/22 0508 03/15/22 1339 03/15/22 1947 03/16/22 0450  BP: 127/81 (!) 145/87 (!) 146/82 137/74  Pulse: 68 66 65 61  Resp: 17 18 16 16  $ Temp: 98.2 F (36.8 C) 98 F (36.7 C) 98.1 F (36.7 C) 98.3 F (36.8 C)  TempSrc: Oral Oral Oral Oral  SpO2: 97% 96% 99% 98%  Weight:      Height:        General: Pt is alert, awake, not in acute distress Cardiovascular: RRR, S1/S2 +, no rubs, no gallops Respiratory: CTA bilaterally, no wheezing, no rhonchi Abdominal: Soft, NT, ND, bowel sounds + Extremities: no edema, no cyanosis    The results of significant diagnostics from this hospitalization (including imaging, microbiology, ancillary and laboratory) are listed below for reference.     Microbiology: Recent Results (from the past 240 hour(s))  Culture, blood  (Routine X 2) w Reflex to ID Panel     Status: None   Collection Time: 03/11/22  1:40 PM   Specimen: BLOOD LEFT HAND  Result Value Ref Range Status   Specimen Description   Final    BLOOD LEFT HAND Performed at [facility], [place] [address], [place], [place] [address]    Special Requests   Final    BOTTLES DRAWN AEROBIC AND ANAEROBIC Blood Culture results may not be optimal due to an inadequate volume of blood received in culture bottles Performed at [facility] [address] [place], [place] [address]    Culture   Final    NO GROWTH 5 DAYS Performed at [facility], [place] [address], [place], [place] [address]    Report Status 03/16/2022  FINAL  Final  Culture, blood (Routine X 2) w Reflex to ID Panel     Status: None   Collection Time: 03/11/22  1:40 PM   Specimen: BLOOD LEFT ARM  Result Value Ref Range Status   Specimen Description   Final    BLOOD LEFT ARM Performed at [facility], [address], [place], [place] [address]    Special Requests   Final    BOTTLES DRAWN AEROBIC AND ANAEROBIC Blood Culture adequate volume Performed at [facility] [address], [place], [place] [address]    Culture   Final    NO GROWTH 5 DAYS Performed at [facility], [place] [address], [place], [place] [address]    Report Status 03/16/2022 FINAL  Final  Resp panel by RT-PCR (RSV, Flu A&B, Covid) Anterior Nasal Swab     Status: None   Collection Time: 03/11/22  5:28 PM   Specimen: Anterior Nasal Swab  Result Value Ref Range Status   SARS Coronavirus 2 by RT PCR NEGATIVE NEGATIVE Final    Comment: (NOTE) SARS-CoV-2 target nucleic acids are NOT DETECTED.  The SARS-CoV-2 RNA is generally detectable in upper respiratory specimens during the acute phase of infection. The lowest concentration of SARS-CoV-2 viral copies this assay can detect is 138 copies/mL. A negative result does not preclude SARS-Cov-2 infection and should not  be used as the sole basis for treatment or other patient management decisions. A negative result may occur with  improper specimen collection/handling, submission of specimen other than nasopharyngeal swab, presence of viral mutation(s) within the areas targeted by this assay, and inadequate number of viral copies(<138 copies/mL). A negative result must be combined with clinical observations, patient history, and epidemiological information. The expected result is Negative.  Fact Sheet for Patients:  EntrepreneurPulse.com.au  Fact Sheet for Healthcare Providers:  IncredibleEmployment.be  This test is no t yet approved or cleared by the [place] FDA and  has been authorized for detection and/or diagnosis of SARS-CoV-2 by FDA under an Emergency Use Authorization (EUA). This EUA will remain  in effect (meaning this test can be used) for the duration of the COVID-19 declaration under Section 564(b)(1) of the Act, 21 U.S.C.section 360bbb-3(b)(1), unless the authorization is terminated  or revoked sooner.       Influenza A by PCR NEGATIVE NEGATIVE Final   Influenza B by PCR NEGATIVE NEGATIVE Final    Comment: (NOTE) The Xpert Xpress SARS-CoV-2/FLU/RSV plus assay is intended as an aid in the diagnosis of influenza from Nasopharyngeal swab specimens and should not be used as a sole basis for treatment. Nasal washings and aspirates are unacceptable for Xpert Xpress SARS-CoV-2/FLU/RSV testing.  Fact Sheet for Patients: EntrepreneurPulse.com.au  Fact Sheet for Healthcare Providers: IncredibleEmployment.be  This test is not yet approved or cleared by the [place] FDA and has been authorized for detection and/or diagnosis of SARS-CoV-2 by FDA under an Emergency Use Authorization (EUA). This EUA will remain in effect (meaning this test can be used) for the duration of the COVID-19 declaration under Section  564(b)(1) of the Act, 21 U.S.C. section 360bbb-3(b)(1), unless the authorization is terminated or revoked.     Resp Syncytial Virus by PCR NEGATIVE NEGATIVE Final    Comment: (NOTE) Fact Sheet for Patients: EntrepreneurPulse.com.au  Fact Sheet for Healthcare Providers: IncredibleEmployment.be  This test is not yet approved or cleared by the [place] FDA and has been authorized for detection and/or diagnosis of SARS-CoV-2 by FDA under  an Emergency Use Authorization (EUA). This EUA will remain in effect (meaning this test can be used) for the duration of the COVID-19 declaration under Section 564(b)(1) of the Act, 21 U.S.C. section 360bbb-3(b)(1), unless the authorization is terminated or revoked.  Performed at [facility], [place] [address] [place], [place] [address]   Respiratory (~20 pathogens) panel by PCR     Status: None   Collection Time: 03/11/22  5:28 PM   Specimen: Nasopharyngeal Swab; Respiratory  Result Value Ref Range Status   Adenovirus NOT DETECTED NOT DETECTED Final   Coronavirus 229E NOT DETECTED NOT DETECTED Final    Comment: (NOTE) The Coronavirus on the Respiratory Panel, DOES NOT test for the novel  Coronavirus (2019 nCoV)    Coronavirus HKU1 NOT DETECTED NOT DETECTED Final   Coronavirus NL63 NOT DETECTED NOT DETECTED Final   Coronavirus OC43 NOT DETECTED NOT DETECTED Final   Metapneumovirus NOT DETECTED NOT DETECTED Final   Rhinovirus / Enterovirus NOT DETECTED NOT DETECTED Final   Influenza A NOT DETECTED NOT DETECTED Final   Influenza B NOT DETECTED NOT DETECTED Final   Parainfluenza Virus 1 NOT DETECTED NOT DETECTED Final   Parainfluenza Virus 2 NOT DETECTED NOT DETECTED Final   Parainfluenza Virus 3 NOT DETECTED NOT DETECTED Final   Parainfluenza Virus 4 NOT DETECTED NOT DETECTED Final   Respiratory Syncytial Virus NOT DETECTED NOT DETECTED Final   Bordetella pertussis NOT DETECTED NOT  DETECTED Final   Bordetella Parapertussis NOT DETECTED NOT DETECTED Final   Chlamydophila pneumoniae NOT DETECTED NOT DETECTED Final   Mycoplasma pneumoniae NOT DETECTED NOT DETECTED Final    Comment: Performed at [facility], [place]. [address] [place], [place] [address]  Urine Culture     Status: None   Collection Time: 03/12/22 12:21 PM   Specimen: Urine, Random  Result Value Ref Range Status   Specimen Description   Final    URINE, RANDOM Performed at [facility] [address] [place], [place] [address]    Special Requests URINE, CLEAN CATCH  Final   Culture   Final    NO GROWTH Performed at [facility], [place] [address] [place], [place] [address]    Report Status 03/13/2022 FINAL  Final     Labs: BNP (last 3 results) No results for input(s): "BNP" in the last 8760 hours. Basic Metabolic Panel: Recent Labs  Lab 03/12/22 0407 03/13/22 0418 03/14/22 0514 03/15/22 0713 03/16/22 0501  NA 142 140 139 140 139  K 3.3* 2.9* 4.0 4.1 3.7  CL 108 108 107 111 108  CO2 25 21* 22 23 24  $ GLUCOSE 177* 107* 176* 139* 158*  BUN 19 18 17 17 17  $ CREATININE 1.81* 1.57* 1.56* 1.79* 1.74*  CALCIUM 9.1 8.9 8.9 8.8* 9.0  MG 2.1 2.0 2.0 2.2 2.8*  PHOS 3.7 3.3 3.4 4.2 3.7   Liver Function Tests: Recent Labs  Lab 03/12/22 0407 03/13/22 0418 03/14/22 0514 03/15/22 0713 03/16/22 0501  AST 20 19 22 27 $ 32  ALT 27 28 29 $ 32 36  ALKPHOS 50 51 60 67 64  BILITOT 0.4 0.6 0.6 0.4 0.5  PROT 5.4* 5.3* 5.7* 6.1* 6.1*  ALBUMIN 2.6* 2.7* 2.9* 2.9* 3.2*   Recent Labs  Lab 03/10/22 2258  LIPASE 40   No results for input(s): "AMMONIA" in the last 168 hours. CBC: Recent Labs  Lab 03/12/22 0407 03/13/22 0418 03/14/22 0514 03/15/22 0713 03/16/22 0501  WBC 8.1 7.1 7.6  7.7 7.6  NEUTROABS 4.7 4.6 4.8 5.2 5.1  HGB 9.3* 9.2* 9.2* 8.8* 9.0*  HCT 29.6* 29.0* 29.1* 28.9* 28.2*  MCV 92.5 91.2 91.8 92.9 91.3  PLT 176 174 173 187 194   Cardiac Enzymes: No  results for input(s): "CKTOTAL", "CKMB", "CKMBINDEX", "TROPONINI" in the last 168 hours. BNP: Invalid input(s): "POCBNP" CBG: Recent Labs  Lab 03/15/22 1623 03/15/22 1948 03/15/22 2333 03/16/22 0452 03/16/22 0827  GLUCAP 162* 128* 141* 154* 97   D-Dimer No results for input(s): "DDIMER" in the last 72 hours. Hgb A1c No results for input(s): "HGBA1C" in the last 72 hours. Lipid Profile No results for input(s): "CHOL", "HDL", "LDLCALC", "TRIG", "CHOLHDL", "LDLDIRECT" in the last 72 hours. Thyroid function studies No results for input(s): "TSH", "T4TOTAL", "T3FREE", "THYROIDAB" in the last 72 hours.  Invalid input(s): "FREET3" Anemia work up No results for input(s): "VITAMINB12", "FOLATE", "FERRITIN", "TIBC", "IRON", "RETICCTPCT" in the last 72 hours. Urinalysis    Component Value Date/Time   COLORURINE YELLOW 03/12/2022 1221   APPEARANCEUR CLOUDY (A) 03/12/2022 1221   LABSPEC 1.010 03/12/2022 1221   PHURINE 5.0 03/12/2022 1221   GLUCOSEU >=500 (A) 03/12/2022 1221   HGBUR MODERATE (A) 03/12/2022 1221   BILIRUBINUR NEGATIVE 03/12/2022 1221   KETONESUR 5 (A) 03/12/2022 1221   PROTEINUR 100 (A) 03/12/2022 1221   UROBILINOGEN 0.2 08/08/2019 1050   NITRITE NEGATIVE 03/12/2022 1221   LEUKOCYTESUR MODERATE (A) 03/12/2022 1221   Sepsis Labs Recent Labs  Lab 03/13/22 0418 03/14/22 0514 03/15/22 0713 03/16/22 0501  WBC 7.1 7.6 7.7 7.6   Microbiology Recent Results (from the past 240 hour(s))  Culture, blood (Routine X 2) w Reflex to ID Panel     Status: None   Collection Time: 03/11/22  1:40 PM   Specimen: BLOOD LEFT HAND  Result Value Ref Range Status   Specimen Description   Final    BLOOD LEFT HAND Performed at [facility], [place] [address] [place], [place] [address]    Special Requests   Final    BOTTLES DRAWN AEROBIC AND ANAEROBIC Blood Culture results may not be optimal due to an inadequate volume of blood received in culture bottles Performed at [facility] [address] [place], [place] [address]    Culture   Final    NO GROWTH 5 DAYS Performed at [facility], [place] [address], [place], [place] [address]    Report Status 03/16/2022 FINAL  Final  Culture, blood (Routine X 2) w Reflex to ID Panel     Status: None   Collection Time: 03/11/22  1:40 PM   Specimen: BLOOD LEFT ARM  Result Value Ref Range Status   Specimen Description   Final    BLOOD LEFT ARM Performed at [facility], [place] [address], [place], [place] [address]    Special Requests   Final    BOTTLES DRAWN AEROBIC AND ANAEROBIC Blood Culture adequate volume Performed at [facility] [address], [place], [place] [address]    Culture   Final    NO GROWTH 5 DAYS Performed at [facility], [place] [address], [place], [place] [address]    Report Status 03/16/2022 FINAL  Final  Resp panel by RT-PCR (RSV, Flu A&B, Covid) Anterior Nasal Swab     Status: None   Collection Time: 03/11/22  5:28 PM   Specimen: Anterior Nasal Swab  Result Value Ref Range Status   SARS Coronavirus 2  by RT PCR NEGATIVE NEGATIVE Final    Comment: (NOTE) SARS-CoV-2 target nucleic acids are NOT DETECTED.  The SARS-CoV-2 RNA is generally detectable in upper respiratory specimens during the acute phase of infection. The lowest concentration of SARS-CoV-2 viral copies this assay can detect is 138 copies/mL. A negative result does not preclude SARS-Cov-2 infection and should not be used as the sole basis for treatment or other patient management decisions. A negative result may occur with  improper specimen collection/handling, submission of specimen other than nasopharyngeal swab, presence of viral mutation(s) within the areas targeted by this assay, and inadequate number of viral copies(<138 copies/mL). A negative result must be combined with clinical observations, patient history, and epidemiological information. The expected result is  Negative.  Fact Sheet for Patients:  EntrepreneurPulse.com.au  Fact Sheet for Healthcare Providers:  IncredibleEmployment.be  This test is no t yet approved or cleared by the [place] FDA and  has been authorized for detection and/or diagnosis of SARS-CoV-2 by FDA under an Emergency Use Authorization (EUA). This EUA will remain  in effect (meaning this test can be used) for the duration of the COVID-19 declaration under Section 564(b)(1) of the Act, 21 U.S.C.section 360bbb-3(b)(1), unless the authorization is terminated  or revoked sooner.       Influenza A by PCR NEGATIVE NEGATIVE Final   Influenza B by PCR NEGATIVE NEGATIVE Final    Comment: (NOTE) The Xpert Xpress SARS-CoV-2/FLU/RSV plus assay is intended as an aid in the diagnosis of influenza from Nasopharyngeal swab specimens and should not be used as a sole basis for treatment. Nasal washings and aspirates are unacceptable for Xpert Xpress SARS-CoV-2/FLU/RSV testing.  Fact Sheet for Patients: EntrepreneurPulse.com.au  Fact Sheet for Healthcare Providers: IncredibleEmployment.be  This test is not yet approved or cleared by the [place] FDA and has been authorized for detection and/or diagnosis of SARS-CoV-2 by FDA under an Emergency Use Authorization (EUA). This EUA will remain in effect (meaning this test can be used) for the duration of the COVID-19 declaration under Section 564(b)(1) of the Act, 21 U.S.C. section 360bbb-3(b)(1), unless the authorization is terminated or revoked.     Resp Syncytial Virus by PCR NEGATIVE NEGATIVE Final    Comment: (NOTE) Fact Sheet for Patients: EntrepreneurPulse.com.au  Fact Sheet for Healthcare Providers: IncredibleEmployment.be  This test is not yet approved or cleared by the [place] FDA and has been authorized for detection and/or diagnosis of  SARS-CoV-2 by FDA under an Emergency Use Authorization (EUA). This EUA will remain in effect (meaning this test can be used) for the duration of the COVID-19 declaration under Section 564(b)(1) of the Act, 21 U.S.C. section 360bbb-3(b)(1), unless the authorization is terminated or revoked.  Performed at [facility], [place] [address] [place], [place] [address]   Respiratory (~20 pathogens) panel by PCR     Status: None   Collection Time: 03/11/22  5:28 PM   Specimen: Nasopharyngeal Swab; Respiratory  Result Value Ref Range Status   Adenovirus NOT DETECTED NOT DETECTED Final   Coronavirus 229E NOT DETECTED NOT DETECTED Final    Comment: (NOTE) The Coronavirus on the Respiratory Panel, DOES NOT test for the novel  Coronavirus (2019 nCoV)    Coronavirus HKU1 NOT DETECTED NOT DETECTED Final   Coronavirus NL63 NOT DETECTED NOT DETECTED Final   Coronavirus OC43 NOT DETECTED NOT DETECTED Final   Metapneumovirus NOT DETECTED NOT DETECTED Final   Rhinovirus / Enterovirus NOT DETECTED NOT DETECTED Final   Influenza A NOT  DETECTED NOT DETECTED Final   Influenza B NOT DETECTED NOT DETECTED Final   Parainfluenza Virus 1 NOT DETECTED NOT DETECTED Final   Parainfluenza Virus 2 NOT DETECTED NOT DETECTED Final   Parainfluenza Virus 3 NOT DETECTED NOT DETECTED Final   Parainfluenza Virus 4 NOT DETECTED NOT DETECTED Final   Respiratory Syncytial Virus NOT DETECTED NOT DETECTED Final   Bordetella pertussis NOT DETECTED NOT DETECTED Final   Bordetella Parapertussis NOT DETECTED NOT DETECTED Final   Chlamydophila pneumoniae NOT DETECTED NOT DETECTED Final   Mycoplasma pneumoniae NOT DETECTED NOT DETECTED Final    Comment: Performed at [facility], [place] [address], [place], [place] [address]  Urine Culture     Status: None   Collection Time: 03/12/22 12:21 PM   Specimen: Urine, Random  Result Value Ref Range Status   Specimen Description   Final    URINE, RANDOM Performed  at [place] [address] [place], [place] [address]    Special Requests URINE, CLEAN CATCH  Final   Culture   Final    NO GROWTH Performed at [facility], [place] [address], [place], [place] [address]    Report Status 03/13/2022 FINAL  Final     Time coordinating discharge: 39 minutes  SIGNED  Georgette Shell, MD  [facility] 03/16/2022, 2:31 PM

## 2022-03-16 NOTE — Plan of Care (Signed)
Patient is stable for discharge. Discharge instructions given, all questions answered. Patient is discharged to home with family.

## 2022-03-20 ENCOUNTER — Telehealth: Payer: Self-pay

## 2022-03-20 NOTE — Transitions of Care (Post Inpatient/ED Visit) (Signed)
   03/20/2022  Name: Curtis Saunders MRN: [id number] DOB: [birth year]  Today's TOC FU Call Status: Unsuccessful Call (1st Attempt) Date: 03/20/22  Attempted to reach the patient regarding the most recent Inpatient/ED visit.  Follow Up Plan: Additional outreach attempts will be made to reach the patient to complete the Transitions of Care (Post Inpatient/ED visit) call.   Signature Eden Lathe, RN

## 2022-03-20 NOTE — Transitions of Care (Post Inpatient/ED Visit) (Signed)
   03/20/2022  Name: Cashton Kustra MRN: [id number] DOB: 13-Sep-1955  Today's TOC FU Call Status: Today's TOC FU Call Status:: Unsuccessful Call (2nd Attempt) Unsuccessful Call (1st Attempt) Date: 03/20/22 Unsuccessful Call (2nd Attempt) Date: 03/20/22  Attempted to reach the patient regarding the most recent Inpatient/ED visit.  Follow Up Plan: Additional outreach attempts will be made to reach the patient to complete the Transitions of Care (Post Inpatient/ED visit) call.   The patient has a hospital follow up appointment with Durene Fruits, NP tomorrow, 2.27.2024.   Signature Eden Lathe, RN

## 2022-03-21 ENCOUNTER — Encounter: Payer: Self-pay | Admitting: Family

## 2022-03-21 ENCOUNTER — Telehealth: Payer: Self-pay

## 2022-03-21 ENCOUNTER — Telehealth: Payer: Self-pay | Admitting: Family

## 2022-03-21 ENCOUNTER — Ambulatory Visit (INDEPENDENT_AMBULATORY_CARE_PROVIDER_SITE_OTHER): Payer: Medicaid Other | Admitting: Family

## 2022-03-21 ENCOUNTER — Encounter: Payer: Medicaid Other | Admitting: Family

## 2022-03-21 VITALS — BP 146/87 | HR 64 | Temp 98.3°F | Resp 16 | Ht 70.98 in | Wt 213.0 lb

## 2022-03-21 DIAGNOSIS — R338 Other retention of urine: Secondary | ICD-10-CM | POA: Diagnosis not present

## 2022-03-21 DIAGNOSIS — E785 Hyperlipidemia, unspecified: Secondary | ICD-10-CM

## 2022-03-21 DIAGNOSIS — E1165 Type 2 diabetes mellitus with hyperglycemia: Secondary | ICD-10-CM

## 2022-03-21 DIAGNOSIS — Z09 Encounter for follow-up examination after completed treatment for conditions other than malignant neoplasm: Secondary | ICD-10-CM | POA: Diagnosis not present

## 2022-03-21 DIAGNOSIS — E2740 Unspecified adrenocortical insufficiency: Secondary | ICD-10-CM

## 2022-03-21 DIAGNOSIS — I517 Cardiomegaly: Secondary | ICD-10-CM

## 2022-03-21 DIAGNOSIS — R7989 Other specified abnormal findings of blood chemistry: Secondary | ICD-10-CM

## 2022-03-21 DIAGNOSIS — N183 Chronic kidney disease, stage 3 unspecified: Secondary | ICD-10-CM

## 2022-03-21 DIAGNOSIS — N400 Enlarged prostate without lower urinary tract symptoms: Secondary | ICD-10-CM

## 2022-03-21 DIAGNOSIS — E876 Hypokalemia: Secondary | ICD-10-CM | POA: Diagnosis not present

## 2022-03-21 DIAGNOSIS — I639 Cerebral infarction, unspecified: Secondary | ICD-10-CM

## 2022-03-21 DIAGNOSIS — I10 Essential (primary) hypertension: Secondary | ICD-10-CM

## 2022-03-21 DIAGNOSIS — I739 Peripheral vascular disease, unspecified: Secondary | ICD-10-CM

## 2022-03-21 DIAGNOSIS — Z794 Long term (current) use of insulin: Secondary | ICD-10-CM

## 2022-03-21 DIAGNOSIS — I7121 Aneurysm of the ascending aorta, without rupture: Secondary | ICD-10-CM

## 2022-03-21 DIAGNOSIS — I6381 Other cerebral infarction due to occlusion or stenosis of small artery: Secondary | ICD-10-CM

## 2022-03-21 NOTE — Progress Notes (Signed)
Erroneous encounter-disregard

## 2022-03-21 NOTE — Transitions of Care (Post Inpatient/ED Visit) (Signed)
   03/21/2022  Name: Markeise Montville MRN: [id number] DOB: 11/16/1955  Today's TOC FU Call Status: Today's TOC FU Call Status:: Successful TOC FU Call Competed Unsuccessful Call (1st Attempt) Date: 03/20/22 Unsuccessful Call (2nd Attempt) Date: 03/20/22 [facility] FU Call Complete Date: 03/21/22  Transition Care Management Follow-up Telephone Call Date of Discharge: 03/16/22 Discharge Facility: [facility]) Type of Discharge: Inpatient Admission Primary Inpatient Discharge Diagnosis:: urinary retention How have you been since you were released from the hospital?: Better Any questions or concerns?: No  Items Reviewed: Did you receive and understand the discharge instructions provided?: Yes Medications obtained and verified?: Yes (Medications Reviewed) (he reviewed meds with PCP today at his appointment.  He has a working glucometer and said his blood sugar has stayed around 124.) Any new allergies since your discharge?: No Dietary orders reviewed?: Yes Type of Diet Ordered:: heart healthy diabetic Do you have support at home?: Yes People in Home: friend(s)  Home Care and Equipment/Supplies: [place] Ordered?: No Any new equipment or medical supplies ordered?: No  Functional Questionnaire: Do you need assistance with bathing/showering or dressing?: No Do you need assistance with meal preparation?: No Do you need assistance with eating?: No Do you have difficulty maintaining continence: No Do you need assistance with getting out of bed/getting out of a chair/moving?: No Do you have difficulty managing or taking your medications?: No  Folllow up appointments reviewed: PCP Follow-up appointment confirmed?: Yes Date of PCP follow-up appointment?: 03/21/22 Follow-up Provider: Rafael Bihari, NP Specialist Hospital Follow-up appointment confirmed?: Yes Date of Specialist follow-up appointment?: 06/16/22 Follow-Up Specialty Provider:: endocrinology Do you need  transportation to your follow-up appointment?: No Do you understand care options if your condition(s) worsen?: Yes-patient verbalized understanding    SIGNATURE: Eden Lathe, RN

## 2022-03-21 NOTE — Progress Notes (Signed)
.  Pt presents for hospital follow-up

## 2022-03-21 NOTE — Telephone Encounter (Signed)
Pt returned Jane's call / please advise

## 2022-03-21 NOTE — Telephone Encounter (Signed)
Call returned to patient and TOC call documented.

## 2022-03-21 NOTE — Progress Notes (Signed)
TRANSITION OF CARE VISIT   Date of Admission: 03/10/2022  Date of Discharge: 03/16/2022  Transitions of Care Call: 03/20/2022  Discharged from: [facility]  Discharge Diagnosis:  Principal Problem:   Acute retention of urine Active Problems:   LVH (left ventricular hypertrophy)   Ascending aortic aneurysm (HCC)   CVA (cerebral vascular accident) ([place])   Stage 3 chronic kidney disease (HCC)   BPH (benign prostatic hyperplasia)   Benign essential HTN   Uncontrolled type 2 diabetes mellitus with hyperglycemia, with long-term current use of insulin ([place])   Acute urinary retention   Obesity (BMI 30-39.9)  Summary of Admission per MD note: #1 acute urinary retention secondary to benign prostatic hypertrophy had Foley catheter placed which has been taken out on the 19th now he is able to urinate without difficulty.  Outpatient follow-up with urology.  Continue Flomax.   #2 CKD stage IIIa creatinine 1.74 on discharge.    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  ACTH was also low. MRI of the brain did not reveal any pituitary adenomas.  However he had small vessel ischemic disease and old basal ganglia infarct.   #4 hypertension blood pressure 145/87 he was started on Flomax.  Stopped Norvasc and ramipril.   #5 uncontrolled type 2 diabetes-CBG (last 3)  Recent Labs (last 2 labs)           Recent Labs    03/15/22 0422 03/15/22 0728 03/15/22 1130  GLUCAP 117* 127* 141*     #6 hyperlipidemia on Crestor   #7 hypokalemia resolved   Estimated body mass index is 30.05 kg/m as calculated from the following:   Height as of this encounter: '5\' 10"'$  (1.778 m).   Weight as of this encounter: 95 kg.  Today's visit 03/21/2022: Patient states he is "doing  ok" today. He denies red flag symptoms such as but not limited to chest pain, shortness of breath, worst headache of life, nausea/vomiting. Reports he is urinating as normal with no red flag symptoms. Still having some constipation but taking medication prescribed for this. Patient unsure of which medications he is taking for his chronic conditions. States he is taking "a white pill and a pink pill" for his blood pressure but doesn't know the names. States he "thought" at hospital discharge they told him to take Norvasc and Ramipril for his high blood pressure. I discussed with patient hospital discharge instructions indicate patient to take Flomax for high blood pressure and enlarged prostate. Patient does not have his prescriptions today in office to refer to. I discussed with patient in detail specialists in which he is already established with. Patient confirmed that he is established with Cardiology, Nephrology, Neurology, and Urology. Patient states he does not believe that he is established with Endocrinology. He is diabetic and monitoring what he eats. His home blood sugars usually 120's before and after eating. States he does not have any issues/concerns for discussion today.    Patient/Caregiver self-reported problems/concerns: see above  MEDICATIONS  Medication Reconciliation conducted with patient/caregiver? (Yes/ No): yes  New medications prescribed/discontinued upon discharge? (Yes/No): yes  Barriers identified related to medications: no  LABS  Lab Reviewed (Yes/No/NA): yes  PHYSICAL EXAM:  Physical Exam HENT:     Head: Normocephalic and atraumatic.  Eyes:     Extraocular Movements: Extraocular movements intact.  Conjunctiva/sclera: Conjunctivae normal.     Pupils: Pupils are equal, round, and reactive to light.  Cardiovascular:     Rate and Rhythm: Normal rate and regular rhythm.     Pulses: Normal pulses.     Heart sounds: Normal heart sounds.  Pulmonary:     Effort:  Pulmonary effort is normal.     Breath sounds: Normal breath sounds.  Abdominal:     General: Bowel sounds are normal.     Palpations: Abdomen is soft.  Musculoskeletal:     Cervical back: Normal range of motion and neck supple.  Neurological:     General: No focal deficit present.     Mental Status: He is alert and oriented to person, place, and time.  Psychiatric:        Mood and Affect: Mood normal.        Behavior: Behavior normal.     ASSESSMENT AND PLAN: 1. Hospital discharge follow-up - Reviewed hospital course, current medications, ensured proper follow-up in place, and addressed concerns.  - Routine screening.  - Basic Metabolic Panel  2. Acute urinary retention 3. Benign prostatic hyperplasia, unspecified whether lower urinary tract symptoms present - Keep all scheduled appointments with Urology.   4. Stage 3 chronic kidney disease, unspecified whether stage 3a or 3b CKD ([place]) - Keep all scheduled appointments with Nephrology.   5. Uncontrolled type 2 diabetes mellitus with hyperglycemia, with long-term current use of insulin ([place]) 6. Adrenal insufficiency ([place]) 7. Low serum adrenocorticotrophic hormone (ACTH) - Referral to Endocrinology for further evaluation/management.  - Ambulatory referral to Endocrinology  8. Cerebrovascular accident (CVA), unspecified mechanism ([place]) 9. Basal ganglia infarction ([place]) 10. Small vessel disease ([place]) - Keep all scheduled appointments with Neurology.   11. Primary hypertension 12. LVH (left ventricular hypertrophy) 13. Ascending aortic aneurysm, unspecified whether ruptured ([place]) 14. Hyperlipidemia, unspecified hyperlipidemia type - Keep all scheduled appointments with Cardiology.   15. Hypokalemia - Resolved prior to hospital discharge.    PATIENT EDUCATION PROVIDED: See AVS   FOLLOW-UP (Include any further testing or referrals):  - Referral to Endocrinology for further evaluation/management. - Keep all scheduled  appointments with specialists.  - Follow-up with primary provider as scheduled.    Patient was given clear instructions to go to Emergency Department or return to medical center if symptoms don't improve, worsen, or new problems develop.The patient verbalized understanding.

## 2022-03-22 LAB — BASIC METABOLIC PANEL
BUN/Creatinine Ratio: 7 — ABNORMAL LOW (ref 10–24)
BUN: 11 mg/dL (ref 8–27)
CO2: 22 mmol/L (ref 20–29)
Calcium: 9.6 mg/dL (ref 8.6–10.2)
Chloride: 106 mmol/L (ref 96–106)
Creatinine, Ser: 1.63 mg/dL — ABNORMAL HIGH (ref 0.76–1.27)
Glucose: 104 mg/dL — ABNORMAL HIGH (ref 70–99)
Potassium: 3.7 mmol/L (ref 3.5–5.2)
Sodium: 143 mmol/L (ref 134–144)
eGFR: 47 mL/min/{1.73_m2} — ABNORMAL LOW (ref >59)

## 2022-03-23 LAB — ALDOSTERONE + RENIN ACTIVITY W/ RATIO
ALDO / PRA Ratio: 4.2 (ref 0.0–30.0)
Aldosterone: 2.3 ng/dL (ref 0.0–30.0)
PRA LC/MS/MS: 0.545 ng/mL/hr (ref 0.167–5.380)

## 2022-04-08 ENCOUNTER — Other Ambulatory Visit: Payer: Self-pay

## 2022-04-08 ENCOUNTER — Encounter (HOSPITAL_COMMUNITY): Payer: Self-pay | Admitting: Emergency Medicine

## 2022-04-08 ENCOUNTER — Emergency Department (HOSPITAL_COMMUNITY)
Admission: EM | Admit: 2022-04-08 | Discharge: 2022-04-08 | Disposition: A | Discharge status: Home / Self Care | Payer: Medicaid Other | Attending: Emergency Medicine | Admitting: Emergency Medicine

## 2022-04-08 ENCOUNTER — Emergency Department (HOSPITAL_COMMUNITY): Payer: Medicaid Other

## 2022-04-08 DIAGNOSIS — Z7982 Long term (current) use of aspirin: Secondary | ICD-10-CM | POA: Diagnosis not present

## 2022-04-08 DIAGNOSIS — E1122 Type 2 diabetes mellitus with diabetic chronic kidney disease: Secondary | ICD-10-CM | POA: Diagnosis not present

## 2022-04-08 DIAGNOSIS — K7689 Other specified diseases of liver: Secondary | ICD-10-CM | POA: Insufficient documentation

## 2022-04-08 DIAGNOSIS — Z79899 Other long term (current) drug therapy: Secondary | ICD-10-CM | POA: Insufficient documentation

## 2022-04-08 DIAGNOSIS — Z794 Long term (current) use of insulin: Secondary | ICD-10-CM | POA: Insufficient documentation

## 2022-04-08 DIAGNOSIS — N183 Chronic kidney disease, stage 3 unspecified: Secondary | ICD-10-CM | POA: Diagnosis not present

## 2022-04-08 DIAGNOSIS — I129 Hypertensive chronic kidney disease with stage 1 through stage 4 chronic kidney disease, or unspecified chronic kidney disease: Secondary | ICD-10-CM | POA: Insufficient documentation

## 2022-04-08 DIAGNOSIS — E876 Hypokalemia: Secondary | ICD-10-CM | POA: Insufficient documentation

## 2022-04-08 DIAGNOSIS — K769 Liver disease, unspecified: Secondary | ICD-10-CM

## 2022-04-08 DIAGNOSIS — R339 Retention of urine, unspecified: Secondary | ICD-10-CM | POA: Diagnosis present

## 2022-04-08 DIAGNOSIS — Z8673 Personal history of transient ischemic attack (TIA), and cerebral infarction without residual deficits: Secondary | ICD-10-CM | POA: Insufficient documentation

## 2022-04-08 LAB — COMPREHENSIVE METABOLIC PANEL
ALT: 21 U/L (ref 0–44)
AST: 17 U/L (ref 15–41)
Albumin: 3.8 g/dL (ref 3.5–5.0)
Alkaline Phosphatase: 75 U/L (ref 38–126)
Anion gap: 8 (ref 5–15)
BUN: 25 mg/dL — ABNORMAL HIGH (ref 8–23)
CO2: 25 mmol/L (ref 22–32)
Calcium: 9.2 mg/dL (ref 8.9–10.3)
Chloride: 113 mmol/L — ABNORMAL HIGH (ref 98–111)
Creatinine, Ser: 2.27 mg/dL — ABNORMAL HIGH (ref 0.61–1.24)
GFR, Estimated: 31 mL/min — ABNORMAL LOW (ref >60)
Glucose, Bld: 166 mg/dL — ABNORMAL HIGH (ref 70–99)
Potassium: 3.2 mmol/L — ABNORMAL LOW (ref 3.5–5.1)
Sodium: 146 mmol/L — ABNORMAL HIGH (ref 135–145)
Total Bilirubin: 0.6 mg/dL (ref 0.3–1.2)
Total Protein: 7.4 g/dL (ref 6.5–8.1)

## 2022-04-08 LAB — LIPASE, BLOOD: Lipase: 33 U/L (ref 11–51)

## 2022-04-08 LAB — CBC WITH DIFFERENTIAL/PLATELET
Abs Immature Granulocytes: 0.03 10*3/uL (ref 0.00–0.07)
Basophils Absolute: 0 10*3/uL (ref 0.0–0.1)
Basophils Relative: 0 %
Eosinophils Absolute: 0.6 10*3/uL — ABNORMAL HIGH (ref 0.0–0.5)
Eosinophils Relative: 5 %
HCT: 34.2 % — ABNORMAL LOW (ref 39.0–52.0)
Hemoglobin: 10.7 g/dL — ABNORMAL LOW (ref 13.0–17.0)
Immature Granulocytes: 0 %
Lymphocytes Relative: 15 %
Lymphs Abs: 1.8 10*3/uL (ref 0.7–4.0)
MCH: 28.5 pg (ref 26.0–34.0)
MCHC: 31.3 g/dL (ref 30.0–36.0)
MCV: 91.2 fL (ref 80.0–100.0)
Monocytes Absolute: 0.6 10*3/uL (ref 0.1–1.0)
Monocytes Relative: 5 %
Neutro Abs: 8.8 10*3/uL — ABNORMAL HIGH (ref 1.7–7.7)
Neutrophils Relative %: 75 %
Platelets: 247 10*3/uL (ref 150–400)
RBC: 3.75 MIL/uL — ABNORMAL LOW (ref 4.22–5.81)
RDW: 15 % (ref 11.5–15.5)
WBC: 11.9 10*3/uL — ABNORMAL HIGH (ref 4.0–10.5)
nRBC: 0 % (ref 0.0–0.2)

## 2022-04-08 LAB — URINALYSIS, ROUTINE W REFLEX MICROSCOPIC
Bacteria, UA: NONE SEEN
Bilirubin Urine: NEGATIVE
Glucose, UA: NEGATIVE mg/dL
Hgb urine dipstick: NEGATIVE
Ketones, ur: NEGATIVE mg/dL
Leukocytes,Ua: NEGATIVE
Nitrite: NEGATIVE
Protein, ur: >=300 mg/dL — AB
Specific Gravity, Urine: 1.007 (ref 1.005–1.030)
pH: 6 (ref 5.0–8.0)

## 2022-04-08 MED ORDER — TAMSULOSIN HCL 0.4 MG PO CAPS: 0.4000 mg | ORAL_CAPSULE | Freq: Two times a day (BID) | ORAL | 0 refills | Status: AC

## 2022-04-08 MED ORDER — POTASSIUM CHLORIDE CRYS ER 20 MEQ PO TBCR
40.0000 meq | EXTENDED_RELEASE_TABLET | Freq: Once | ORAL | Status: AC
  Administered 2022-04-08: 40 meq via ORAL
  Filled 2022-04-08: qty 2

## 2022-04-08 MED ORDER — LACTATED RINGERS IV BOLUS
1000.0000 mL | Freq: Once | INTRAVENOUS | Status: AC
  Administered 2022-04-08: 1000 mL via INTRAVENOUS

## 2022-04-08 NOTE — ED Provider Notes (Signed)
[facility] Provider Note  CSN: [id number] Arrival date & time: 04/08/22 1431  Chief Complaint(s) Abdominal Pain  HPI Curtis Saunders is a 67 y.o. male with past medical history as below, significant for DM, hypertension, BPH, CKD stage III, HTN who presents to the ED with complaint of difficulty urinating, suprapubic abd pain.  Patient was admitted last month with associated urinary retention and urosepsis.  He was started on Flomax while in the hospital and Foley catheter was placed which has since been discontinued.  Patient reports over the past 3 days difficulty with urination, incomplete voiding, urinary urgency.  Suprapubic dull discomfort.  No change in p.o. intake.  Was reportedly taking Flomax as prescribed per the spouse he only takes it as needed.  No fevers.  Past Medical History Past Medical History:  Diagnosis Date   Diabetes mellitus ([place])    Hypertension    Stroke ([place]) 2023   x 2, no deficits   Patient Active Problem List   Diagnosis Date Noted   Benign essential HTN 03/11/2022   Uncontrolled type 2 diabetes mellitus with hyperglycemia, with long-term current use of insulin ([place]) 03/11/2022   Acute urinary retention 03/11/2022   Obesity (BMI 30-39.9) 03/11/2022   Acute retention of urine 03/11/2022   Acute kidney injury superimposed on chronic kidney disease ([place]) 03/01/2022   New onset type 2 diabetes mellitus ([place]) 02/28/2022   BPH (benign prostatic hyperplasia) 02/28/2022   Pain in joint of right shoulder 01/09/2022   Partial thickness rotator cuff tear 01/09/2022   Osteoarthritis of right knee 11/08/2021   Pain in joint of right knee 11/08/2021   Low back pain 07/14/2021   Pain in joint of left shoulder 08/12/2020   Stage 3 chronic kidney disease ([place]) 04/12/2020   CVA (cerebral vascular accident) ([place]) 02/27/2020   Ascending aortic aneurysm ([place]) 02/11/2020   Double pterygium, 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 Authorizing Provider  tamsulosin (FLOMAX) 0.4 MG CAPS capsule Take 1 capsule (0.4 mg total) by mouth in the morning and at bedtime for 10 days. 04/08/22 04/18/22 Yes Jeanell Sparrow, DO  Accu-Chek FastClix Lancets MISC Apply topically. 03/07/22   [provider]  acetaminophen (TYLENOL) 325 MG tablet Take 2 tablets (650 mg total) by mouth every 6 (six) hours as needed for mild pain (or Fever >/= 101). 03/16/22   Georgette Shell, MD  aspirin EC 81 MG EC tablet Take 1 tablet (81 mg total) by mouth daily. Swallow whole. 02/28/20   Barb Merino, MD  blood glucose meter kit and supplies Dispense based on patient and insurance preference. Use up to four times daily as directed. (FOR ICD-10 E10.9, E11.9). 03/02/22   Rai, Ripudeep K, MD  Blood Glucose Monitoring Suppl (ACCU-CHEK AVIVA PLUS) w/Device KIT 1 each by Does not apply route 4 (four) times daily -  before meals and at bedtime. 03/07/22   Camillia Herter, NP  docusate sodium (COLACE) 100 MG capsule Take 1 capsule (100 mg total) by mouth 2 (two) times daily. 03/16/22   Georgette Shell, MD  glucose blood (ACCU-CHEK GUIDE) test strip Use as instructed 03/07/22   Camillia Herter, NP  insulin detemir (LEVEMIR) 100 UNIT/ML FlexPen Inject 20 Units into the skin 2 (two)  times daily. 03/02/22   Samella Parr, NP  Insulin Pen Needle (PEN NEEDLES 3/16") 31G X 5 MM MISC 20 Units by Does not apply route 2 (two) times daily. 03/02/22   Rai, Vernelle Emerald, MD  Lactulose 20 GM/30ML SOLN Take 15 mLs (10 g total) by mouth every 12 (twelve) hours as needed. 03/16/22   Georgette Shell, MD  Lancets Misc. (ACCU-CHEK FASTCLIX LANCET) KIT 1 each by Does not apply route 4 (four) times daily -  before meals and at bedtime.  03/07/22   Camillia Herter, NP  ondansetron (ZOFRAN) 4 MG tablet Take 1 tablet (4 mg total) by mouth every 6 (six) hours as needed for nausea. 03/16/22   Georgette Shell, MD  rosuvastatin (CRESTOR) 20 MG tablet Take 1 tablet by mouth once daily 02/27/22   Frann Rider, NP  senna (SENOKOT) 8.6 MG TABS tablet Take 1 tablet (8.6 mg total) by mouth 2 (two) times daily. 03/16/22   Georgette Shell, MD  tamsulosin (FLOMAX) 0.4 MG CAPS capsule Take 1 capsule (0.4 mg total) by mouth daily. 03/02/22   Mendel Corning, MD                                                                                                                                    Past Surgical History Past Surgical History:  Procedure Laterality Date   APPENDECTOMY     EYE SURGERY     HAND SURGERY     KNEE SURGERY     x3, x1 R   LIPOMA EXCISION Left 10/13/2019   Procedure: SHOULDER EXCISION LIPOMA AND ROTATOR CUFF REPAIR;  Surgeon: Tania Ade, MD;  Location: [employer];  Service: Orthopedics;  Laterality: Left;   TONSILLECTOMY     Family History Family History  Problem Relation Age of Onset   Heart attack Mother 79   COPD Mother    Emphysema Mother    Diabetes Father    Diabetes Sister    Hypertension Sister    Sleep apnea Neg Hx     Social History Social History   Tobacco Use   Smoking status: Never    Passive exposure: Never   Smokeless tobacco: Never  Vaping Use   Vaping Use: Never used  Substance Use Topics   Alcohol use: Yes    Comment: occasional   Drug use: No   Allergies Bee venom  Review of Systems Review of Systems  Constitutional:  Negative for chills and fever.  HENT:  Negative for facial swelling and trouble swallowing.   Eyes:  Negative for photophobia and visual disturbance.  Respiratory:  Negative for cough and shortness of breath.   Cardiovascular:  Negative for chest pain and palpitations.  Gastrointestinal:  Positive for abdominal pain. Negative for nausea and  vomiting.  Endocrine: Negative for polydipsia and polyuria.  Genitourinary:  Positive for difficulty urinating and urgency. Negative for hematuria.  Musculoskeletal:  Negative for gait problem and joint swelling.  Skin:  Negative for pallor and rash.  Neurological:  Negative for syncope and headaches.  Psychiatric/Behavioral:  Negative for agitation and confusion.     Physical Exam Vital Signs  I have reviewed the triage vital signs BP (!) 177/108   Pulse 75   Temp 97.7 F (36.5 C) (Oral)   Resp 18   Ht 5\' 11"  (1.803 m)   Wt 96.6 kg   SpO2 99%   BMI 29.71 kg/m  Physical Exam Vitals and nursing note reviewed.  Constitutional:      General: He is not in acute distress.    Appearance: He is well-developed.  HENT:     Head: Normocephalic and atraumatic.     Right Ear: External ear normal.     Left Ear: External ear normal.     Mouth/Throat:     Mouth: Mucous membranes are moist.  Eyes:     General: No scleral icterus. Cardiovascular:     Rate and Rhythm: Normal rate and regular rhythm.     Pulses: Normal pulses.     Heart sounds: Normal heart sounds.  Pulmonary:     Effort: Pulmonary effort is normal. No respiratory distress.     Breath sounds: Normal breath sounds.  Abdominal:     General: Abdomen is flat. There is no distension.     Palpations: Abdomen is soft.     Tenderness: There is abdominal tenderness in the suprapubic area. There is no guarding or rebound.    Musculoskeletal:     Right lower leg: No edema.     Left lower leg: No edema.  Skin:    General: Skin is warm and dry.     Capillary Refill: Capillary refill takes less than 2 seconds.  Neurological:     Mental Status: He is alert and oriented to person, place, and time.     GCS: GCS eye subscore is 4. GCS verbal subscore is 5. GCS motor subscore is 6.  Psychiatric:        Mood and Affect: Mood normal.        Behavior: Behavior normal.     ED Results and Treatments Labs (all labs ordered are  listed, but only abnormal results are displayed) Labs Reviewed  CBC WITH DIFFERENTIAL/PLATELET - Abnormal; Notable for the following components:      Result Value   WBC 11.9 (*)    RBC 3.75 (*)    Hemoglobin 10.7 (*)    HCT 34.2 (*)    Neutro Abs 8.8 (*)    Eosinophils Absolute 0.6 (*)    All other components within normal limits  COMPREHENSIVE METABOLIC PANEL - Abnormal; Notable for the following components:   Sodium 146 (*)    Potassium 3.2 (*)    Chloride 113 (*)    Glucose, Bld 166 (*)    BUN 25 (*)    Creatinine, Ser 2.27 (*)    GFR, Estimated 31 (*)    All other components within normal limits  URINALYSIS, ROUTINE W REFLEX MICROSCOPIC - Abnormal; Notable for the following components:   Color, Urine STRAW (*)    Protein, ur >=300 (*)    All other components within normal limits  LIPASE, BLOOD  Radiology CT ABDOMEN PELVIS WO CONTRAST  Result Date: 04/08/2022 CLINICAL DATA:  Abdomen pain with urine retention EXAM: CT ABDOMEN AND PELVIS WITHOUT CONTRAST TECHNIQUE: Multidetector CT imaging of the abdomen and pelvis was performed following the standard protocol without IV contrast. RADIATION DOSE REDUCTION: This exam was performed according to the departmental dose-optimization program which includes automated exposure control, adjustment of the mA and/or kV according to patient size and/or use of iterative reconstruction technique. COMPARISON:  CT 03/11/2022, 02/20/2018 FINDINGS: Lower chest: Lung bases demonstrate no acute airspace disease. Trace pericardial effusion. Hepatobiliary: Question vague hypodense lesion at the right hepatic dome no gallstones, gallbladder wall thickening, or biliary dilatation. Pancreas: Unremarkable. No pancreatic ductal dilatation or surrounding inflammatory changes. Spleen: Normal in size without focal abnormality. Adrenals/Urinary  Tract: Adrenal glands are within normal limits. Cysts in the right kidney,, no imaging follow-up is recommended. Perinephric fat stranding. Mild bilateral hydronephrosis and hydroureter without obstructing stone. Marked lateral wall thickening with perivesical stranding. Stomach/Bowel: Stomach nonenlarged. No dilated small bowel. No acute bowel wall thickening. Status post appendectomy. Vascular/Lymphatic: Nonaneurysmal aorta. No suspicious lymph nodes. Mild atherosclerosis. Reproductive: Prostate is enlarged. Other: Negative for pelvic effusion or free air. Small fat containing inguinal hernias. Small umbilical fat containing hernia. Musculoskeletal: No acute or suspicious osseous abnormality IMPRESSION: 1. Mild bilateral hydronephrosis and hydroureter without obstructing stone. There is marked wall thickening of the urinary bladder with perivesical stranding, findings could be secondary to cystitis and/or outlet obstruction. Prostate is enlarged. 2. Questionable vague hypodense lesion at the dome of the liver, limited assessment without contrast. When the patient is clinically stable and able to follow directions and hold their breath (preferably as an outpatient) further evaluation with dedicated abdominal MRI should be considered. Electronically Signed   By: Donavan Foil M.D.   On: 04/08/2022 17:27    Pertinent labs & imaging results that were available during my care of the patient were reviewed by me and considered in my medical decision making (see MDM for details).  Medications Ordered in ED Medications  lactated ringers bolus 1,000 mL (1,000 mLs Intravenous New Bag/Given 04/08/22 1705)  potassium chloride SA (KLOR-CON M) CR tablet 40 mEq (40 mEq Oral Given 04/08/22 1703)                                                                                                                                     Procedures Procedures  (including critical care time)  Medical Decision Making / ED  Course    Medical Decision Making:    Tyquavion Ciminelli is a 67 y.o. male with past medical history as below, significant for DM, hypertension, BPH, CKD stage III, HTN who presents to the ED with complaint of difficulty urinating, suprapubic abd pain.. The complaint involves an extensive differential diagnosis and also carries with it a high risk of complications and morbidity.  Serious etiology was considered. Ddx includes but is not limited  to: Differential diagnosis includes but is not exclusive to acute appendicitis, renal colic, testicular torsion, urinary tract infection, prostatitis,  diverticulitis, small bowel obstruction, colitis, abdominal aortic aneurysm, gastroenteritis, constipation etc.   Complete initial physical exam performed, notably the patient  was no acute distress, resting comfortably.    Reviewed and confirmed nursing documentation for past medical history, family history, social history.  Vital signs reviewed.    Clinical Course as of 04/08/22 1834  Sat Apr 08, 2022  1540 Hemoglobin(!): 10.7 Improved from prior [SG]  1540 WBC, UA: 11-20 [SG]  1540 Bacteria, UA: NONE SEEN Sterile pyuria [SG]  1730 Creatinine(!): 2.27 Baseline around 1.7. Will give IVF [SG]  1730 Pt with urinary retention, able to pass some urine but unable to void completely. Post void ~440mL. Recommend foley, restart flomax, f/u urology [SG]  1745 Agreeable to foley, will do flomax BID, see uro x1 week  [SG]  1829 Potassium(!): 3.2 RePlaced [SG]    Clinical Course User Index [SG] Wynona Dove A, DO    Foley placed, feeling better  Potassium low, replaced orally  Blood pressure is mildly elevated, missed blood pressure medication dose, advised him to take his medications once he gets home  Follow-up with urology in regards to urinary retention; trial of void  The patient improved significantly and was discharged in stable condition. Detailed discussions were had with the patient  regarding current findings, and need for close f/u with PCP or on call doctor. The patient has been instructed to return immediately if the symptoms worsen in any way for re-evaluation. Patient verbalized understanding and is in agreement with current care plan. All questions answered prior to discharge.    Additional history obtained: -Additional history obtained from spouse -External records from outside source obtained and reviewed including: Chart review including previous notes, labs, imaging, consultation notes including prior admission documentation, home medications, prior labs and imaging, prior ED visit   Lab Tests: -I ordered, reviewed, and interpreted labs.   The pertinent results include:   Labs Reviewed  CBC WITH DIFFERENTIAL/PLATELET - Abnormal; Notable for the following components:      Result Value   WBC 11.9 (*)    RBC 3.75 (*)    Hemoglobin 10.7 (*)    HCT 34.2 (*)    Neutro Abs 8.8 (*)    Eosinophils Absolute 0.6 (*)    All other components within normal limits  COMPREHENSIVE METABOLIC PANEL - Abnormal; Notable for the following components:   Sodium 146 (*)    Potassium 3.2 (*)    Chloride 113 (*)    Glucose, Bld 166 (*)    BUN 25 (*)    Creatinine, Ser 2.27 (*)    GFR, Estimated 31 (*)    All other components within normal limits  URINALYSIS, ROUTINE W REFLEX MICROSCOPIC - Abnormal; Notable for the following components:   Color, Urine STRAW (*)    Protein, ur >=300 (*)    All other components within normal limits  LIPASE, BLOOD    Notable for as above  EKG   EKG Interpretation  Date/Time:    Ventricular Rate:    PR Interval:    QRS Duration:   QT Interval:    QTC Calculation:   R Axis:     Text Interpretation:           Imaging Studies ordered: I ordered imaging studies including CT AP I independently visualized the following imaging with scope of interpretation limited to determining acute life threatening  conditions related to emergency  care: Bladder outlet obstruction, retention, questionable liver lesion I independently visualized and interpreted imaging. I agree with the radiologist interpretation   Medicines ordered and prescription drug management: Meds ordered this encounter  Medications   lactated ringers bolus 1,000 mL   potassium chloride SA (KLOR-CON M) CR tablet 40 mEq   tamsulosin (FLOMAX) 0.4 MG CAPS capsule    Sig: Take 1 capsule (0.4 mg total) by mouth in the morning and at bedtime for 10 days.    Dispense:  20 capsule    Refill:  0    -I have reviewed the patients home medicines and have made adjustments as needed   Consultations Obtained: na   Cardiac Monitoring: The patient was maintained on a cardiac monitor.  I personally viewed and interpreted the cardiac monitored which showed an underlying rhythm of: NSR  Social Determinants of Health:  Diagnosis or treatment significantly limited by social determinants of health: na   Reevaluation: After the interventions noted above, I reevaluated the patient and found that they have resolved  Co morbidities that complicate the patient evaluation  Past Medical History:  Diagnosis Date   Diabetes mellitus ([place])    Hypertension    Stroke ([place]) 2023   x 2, no deficits      Dispostion: Disposition decision including need for hospitalization was considered, and patient discharged from emergency department.    Final Clinical Impression(s) / ED Diagnoses Final diagnoses:  Urinary retention  Liver lesion     This chart was dictated using voice recognition software.  Despite best efforts to proofread,  errors can occur which can change the documentation meaning.    Jeanell Sparrow, DO 04/08/22 [phone]

## 2022-04-08 NOTE — Discharge Instructions (Addendum)
Please take Flomax 2 times daily until seen by urology (10 days rx provided)  Please call urology on Monday to schedule follow up appointment  There is a questionable lesion on your liver, recommend you follow-up with PCP to schedule outpatient MRI at your convenience  It was a pleasure caring for you today in the emergency department.  Please return to the emergency department for any worsening or worrisome symptoms.

## 2022-04-08 NOTE — ED Triage Notes (Signed)
Patient arrives ambulatory by POV c/o lower abdominal pain, flank pain and bladder issues over the past month. Patient states pain is now unbearable. Patient concerned for urinary retention. Has appt with urology April 10th.

## 2022-04-08 NOTE — ED Notes (Addendum)
Patient no longer in room, assumed ride arrived and patient walked out after discharge instructions given on dayshift by previous RN.

## 2022-04-08 NOTE — ED Notes (Signed)
Foley inserted, some difficulty but was able to complete insertion. Some blood noted in initial urine drain, but quickly cleared. Pt is c/o some pain in urinary meatus.

## 2022-04-24 ENCOUNTER — Emergency Department (HOSPITAL_COMMUNITY)
Admission: EM | Admit: 2022-04-24 | Discharge: 2022-04-24 | Disposition: A | Discharge status: Home / Self Care | Payer: Medicaid Other | Attending: Emergency Medicine | Admitting: Emergency Medicine

## 2022-04-24 ENCOUNTER — Emergency Department (HOSPITAL_COMMUNITY): Payer: Medicaid Other

## 2022-04-24 ENCOUNTER — Encounter (HOSPITAL_COMMUNITY): Payer: Self-pay

## 2022-04-24 DIAGNOSIS — N3000 Acute cystitis without hematuria: Secondary | ICD-10-CM | POA: Diagnosis not present

## 2022-04-24 DIAGNOSIS — Z7982 Long term (current) use of aspirin: Secondary | ICD-10-CM | POA: Diagnosis not present

## 2022-04-24 DIAGNOSIS — Z794 Long term (current) use of insulin: Secondary | ICD-10-CM | POA: Diagnosis not present

## 2022-04-24 DIAGNOSIS — N433 Hydrocele, unspecified: Secondary | ICD-10-CM | POA: Diagnosis not present

## 2022-04-24 DIAGNOSIS — R1031 Right lower quadrant pain: Secondary | ICD-10-CM

## 2022-04-24 DIAGNOSIS — N50811 Right testicular pain: Secondary | ICD-10-CM | POA: Diagnosis present

## 2022-04-24 LAB — CBC WITH DIFFERENTIAL/PLATELET
Abs Immature Granulocytes: 0.13 10*3/uL — ABNORMAL HIGH (ref 0.00–0.07)
Basophils Absolute: 0 10*3/uL (ref 0.0–0.1)
Basophils Relative: 0 %
Eosinophils Absolute: 0.4 10*3/uL (ref 0.0–0.5)
Eosinophils Relative: 3 %
HCT: 38.1 % — ABNORMAL LOW (ref 39.0–52.0)
Hemoglobin: 12.2 g/dL — ABNORMAL LOW (ref 13.0–17.0)
Immature Granulocytes: 1 %
Lymphocytes Relative: 16 %
Lymphs Abs: 2.3 10*3/uL (ref 0.7–4.0)
MCH: 28.9 pg (ref 26.0–34.0)
MCHC: 32 g/dL (ref 30.0–36.0)
MCV: 90.3 fL (ref 80.0–100.0)
Monocytes Absolute: 0.7 10*3/uL (ref 0.1–1.0)
Monocytes Relative: 5 %
Neutro Abs: 10.3 10*3/uL — ABNORMAL HIGH (ref 1.7–7.7)
Neutrophils Relative %: 75 %
Platelets: 320 10*3/uL (ref 150–400)
RBC: 4.22 MIL/uL (ref 4.22–5.81)
RDW: 14.5 % (ref 11.5–15.5)
WBC: 13.8 10*3/uL — ABNORMAL HIGH (ref 4.0–10.5)
nRBC: 0 % (ref 0.0–0.2)

## 2022-04-24 LAB — BASIC METABOLIC PANEL
Anion gap: 10 (ref 5–15)
BUN: 20 mg/dL (ref 8–23)
CO2: 23 mmol/L (ref 22–32)
Calcium: 9 mg/dL (ref 8.9–10.3)
Chloride: 108 mmol/L (ref 98–111)
Creatinine, Ser: 1.66 mg/dL — ABNORMAL HIGH (ref 0.61–1.24)
GFR, Estimated: 46 mL/min — ABNORMAL LOW (ref >60)
Glucose, Bld: 117 mg/dL — ABNORMAL HIGH (ref 70–99)
Potassium: 3.5 mmol/L (ref 3.5–5.1)
Sodium: 141 mmol/L (ref 135–145)

## 2022-04-24 LAB — URINALYSIS, ROUTINE W REFLEX MICROSCOPIC
Bilirubin Urine: NEGATIVE
Glucose, UA: NEGATIVE mg/dL
Hgb urine dipstick: NEGATIVE
Ketones, ur: 5 mg/dL — AB
Nitrite: POSITIVE — AB
Protein, ur: >=300 mg/dL — AB
Specific Gravity, Urine: 1.012 (ref 1.005–1.030)
WBC, UA: >50 WBC/hpf (ref 0–5)
pH: 5 (ref 5.0–8.0)

## 2022-04-24 MED ORDER — CIPROFLOXACIN HCL 500 MG PO TABS: 500.0000 mg | ORAL_TABLET | Freq: Two times a day (BID) | ORAL | 0 refills | Status: DC

## 2022-04-24 MED ORDER — CEFTRIAXONE SODIUM 1 G IJ SOLR: 500.0000 mg | Freq: Once | INTRAMUSCULAR | Status: DC

## 2022-04-24 MED ORDER — CEFTRIAXONE SODIUM 1 G IJ SOLR
1.0000 g | Freq: Once | INTRAMUSCULAR | Status: AC
  Administered 2022-04-24: 1 g via INTRAMUSCULAR
  Filled 2022-04-24: qty 10

## 2022-04-24 MED ORDER — LIDOCAINE HCL (PF) 1 % IJ SOLN
INTRAMUSCULAR | Status: AC
  Administered 2022-04-24: 2.1 mL
  Filled 2022-04-24: qty 30

## 2022-04-24 MED ORDER — ONDANSETRON HCL 4 MG/2ML IJ SOLN
4.0000 mg | Freq: Once | INTRAMUSCULAR | Status: AC
  Administered 2022-04-24: 4 mg via INTRAVENOUS
  Filled 2022-04-24: qty 2

## 2022-04-24 MED ORDER — DOXYCYCLINE HYCLATE 100 MG PO TABS: 100.0000 mg | ORAL_TABLET | Freq: Once | ORAL | Status: DC

## 2022-04-24 MED ORDER — HYDROMORPHONE HCL 1 MG/ML IJ SOLN
1.0000 mg | Freq: Once | INTRAMUSCULAR | Status: AC
  Administered 2022-04-24: 1 mg via INTRAVENOUS
  Filled 2022-04-24: qty 1

## 2022-04-24 MED ORDER — DOXYCYCLINE HYCLATE 100 MG PO CAPS: 100.0000 mg | ORAL_CAPSULE | Freq: Two times a day (BID) | ORAL | 0 refills | Status: DC

## 2022-04-24 NOTE — ED Triage Notes (Signed)
Pt c/o right sided testicle swelling and groin pain. Pt states pain when he attempts to walk. Denies injury/trauma. Is able to void without issue. Pt does have urologist, had the same problem in [id number], dx with hydrocele

## 2022-04-24 NOTE — ED Notes (Signed)
US at bedside

## 2022-04-24 NOTE — Discharge Instructions (Addendum)
You are seen today in the emergency department due to testicle pain.  Your ultrasound showed a hydrocele, I would recommend following up with urology regarding these findings.  Your urine was also showing signs of infection concerning for a UTI versus urethritis.  You are treated for that with 1 dose of IM Rocephin here in the ED. you to take the ciprofloxacin twice daily for the next 7 days to cure the infection.  Please see your urologist within the next week.  Return to the ED for new or concerning symptoms.

## 2022-04-24 NOTE — ED Notes (Signed)
Pt reports his ride is on the way here to pick him up. Pt A&Ox4, verbalized understanding of d/c instructions, prescription and follow up care.

## 2022-04-24 NOTE — ED Provider Notes (Signed)
[place] EMERGENCY DEPARTMENT AT [facility] Provider Note   CSN: [id number] Arrival date & time: 04/24/22  1048     History  Chief Complaint  Patient presents with   Groin Pain    Curtis Saunders is a 67 y.o. male.   Groin Pain     This is a 67 year old male presented to the emergency department due to right testicular pain.  Symptoms started acutely this morning, mostly to the right testicle and the right testicle is enlarged.  He is able to urinate but endorses dysuria with urination.  No penile discharge or hematuria.  Some mild suprapubic tenderness.  The pain is worse with any ambulation.  Home Medications Prior to Admission medications   Medication Sig Start Date End Date Taking? Authorizing Provider  ciprofloxacin (CIPRO) 500 MG tablet Take 1 tablet (500 mg total) by mouth every 12 (twelve) hours. 04/24/22  Yes Sherrill Raring, PA-C  Accu-Chek FastClix Lancets MISC Apply topically. 03/07/22   [provider]  acetaminophen (TYLENOL) 325 MG tablet Take 2 tablets (650 mg total) by mouth every 6 (six) hours as needed for mild pain (or Fever >/= 101). 03/16/22   Georgette Shell, MD  aspirin EC 81 MG EC tablet Take 1 tablet (81 mg total) by mouth daily. Swallow whole. 02/28/20   Barb Merino, MD  blood glucose meter kit and supplies Dispense based on patient and insurance preference. Use up to four times daily as directed. (FOR ICD-10 E10.9, E11.9). 03/02/22   Rai, Ripudeep K, MD  Blood Glucose Monitoring Suppl (ACCU-CHEK AVIVA PLUS) w/Device KIT 1 each by Does not apply route 4 (four) times daily -  before meals and at bedtime. 03/07/22   Camillia Herter, NP  docusate sodium (COLACE) 100 MG capsule Take 1 capsule (100 mg total) by mouth 2 (two) times daily. 03/16/22   Georgette Shell, MD  glucose blood (ACCU-CHEK GUIDE) test strip Use as instructed 03/07/22   Camillia Herter, NP  insulin detemir (LEVEMIR) 100 UNIT/ML FlexPen Inject 20 Units into the  skin 2 (two) times daily. 03/02/22   Samella Parr, NP  Insulin Pen Needle (PEN NEEDLES 3/16") 31G X 5 MM MISC 20 Units by Does not apply route 2 (two) times daily. 03/02/22   Rai, Vernelle Emerald, MD  Lactulose 20 GM/30ML SOLN Take 15 mLs (10 g total) by mouth every 12 (twelve) hours as needed. 03/16/22   Georgette Shell, MD  Lancets Misc. (ACCU-CHEK FASTCLIX LANCET) KIT 1 each by Does not apply route 4 (four) times daily -  before meals and at bedtime. 03/07/22   Camillia Herter, NP  ondansetron (ZOFRAN) 4 MG tablet Take 1 tablet (4 mg total) by mouth every 6 (six) hours as needed for nausea. 03/16/22   Georgette Shell, MD  rosuvastatin (CRESTOR) 20 MG tablet Take 1 tablet by mouth once daily 02/27/22   Frann Rider, NP  senna (SENOKOT) 8.6 MG TABS tablet Take 1 tablet (8.6 mg total) by mouth 2 (two) times daily. 03/16/22   Georgette Shell, MD  tamsulosin (FLOMAX) 0.4 MG CAPS capsule Take 1 capsule (0.4 mg total) by mouth daily. 03/02/22   Rai, Vernelle Emerald, MD      Allergies    Bee venom    Review of Systems   Review of Systems  Physical Exam Updated Vital Signs BP 107/80   Pulse 79   Temp (!) 97.5 F (36.4 C) (Oral)   Resp 18  Ht 5\' 11"  (1.803 m)   Wt 94.8 kg   SpO2 94%   BMI 29.15 kg/m  Physical Exam Vitals and nursing note reviewed. Exam conducted with a chaperone present.  Constitutional:      Appearance: Normal appearance.  HENT:     Head: Normocephalic and atraumatic.  Eyes:     General: No scleral icterus.       Right eye: No discharge.        Left eye: No discharge.     Extraocular Movements: Extraocular movements intact.     Pupils: Pupils are equal, round, and reactive to light.  Cardiovascular:     Rate and Rhythm: Normal rate and regular rhythm.     Pulses: Normal pulses.     Heart sounds: Normal heart sounds. No murmur heard.    No friction rub. No gallop.  Pulmonary:     Effort: Pulmonary effort is normal. No respiratory distress.     Breath sounds:  Normal breath sounds.  Abdominal:     General: Abdomen is flat. Bowel sounds are normal. There is no distension.     Palpations: Abdomen is soft.     Tenderness: There is no abdominal tenderness.  Genitourinary:    Comments: RN Engineer, materials -right testicle is enlarged compared to left, exquisitely tender with hydrocele.  Urethra is patent, no phimosis or discharge Skin:    General: Skin is warm and dry.     Coloration: Skin is not jaundiced.  Neurological:     Mental Status: He is alert. Mental status is at baseline.     Coordination: Coordination normal.     ED Results / Procedures / Treatments   Labs (all labs ordered are listed, but only abnormal results are displayed) Labs Reviewed  URINALYSIS, ROUTINE W REFLEX MICROSCOPIC - Abnormal; Notable for the following components:      Result Value   APPearance CLOUDY (*)    Ketones, ur 5 (*)    Protein, ur >=300 (*)    Nitrite POSITIVE (*)    Leukocytes,Ua LARGE (*)    Bacteria, UA MANY (*)    All other components within normal limits  BASIC METABOLIC PANEL - Abnormal; Notable for the following components:   Glucose, Bld 117 (*)    Creatinine, Ser 1.66 (*)    GFR, Estimated 46 (*)    All other components within normal limits  CBC WITH DIFFERENTIAL/PLATELET - Abnormal; Notable for the following components:   WBC 13.8 (*)    Hemoglobin 12.2 (*)    HCT 38.1 (*)    Neutro Abs 10.3 (*)    Abs Immature Granulocytes 0.13 (*)    All other components within normal limits    EKG None  Radiology US SCROTUM W/DOPPLER  Result Date: 04/24/2022 CLINICAL DATA:  Groin pain and swelling for 1 day EXAM: SCROTAL ULTRASOUND DOPPLER ULTRASOUND OF THE TESTICLES TECHNIQUE: Complete ultrasound examination of the testicles, epididymis, and other scrotal structures was performed. Color and spectral Doppler ultrasound were also utilized to evaluate blood flow to the testicles. COMPARISON:  09/04/2020 FINDINGS: Right testicle Measurements: 4.0 x  2.2 x 2.2 cm. Normal echogenicity without mass or calcification. Internal blood flow present on color Doppler imaging. Left testicle Measurements: 4.0 x 1.8 x 1.8 cm. Mildly heterogeneous LEFT testis little changed from previous exam. No discrete mass or calcification. Internal blood flow present on color Doppler imaging. Right epididymis:  Small cyst at epididymal head 3 mm diameter Left epididymis:  Small cyst at epididymal  head 2 mm diameter Hydrocele: BILATERAL hydroceles, small LEFT large RIGHT. RIGHT appendix testis noted. Varicocele:  None visualized. Pulsed Doppler interrogation of both testes demonstrates normal low resistance arterial and venous waveforms bilaterally. IMPRESSION: BILATERAL hydroceles, LEFT large and RIGHT. Small cysts at the epididymal heads bilaterally. No evidence of testicular mass or torsion. Heterogeneous LEFT testis without discrete mass little changed from previous exam, likely reflects sequela of prior infection, torsion, or trauma Electronically Signed   By: Ulyses Southward M.D.   On: 04/24/2022 12:52    Procedures Procedures    Medications Ordered in ED Medications  HYDROmorphone (DILAUDID) injection 1 mg (1 mg Intravenous Given 04/24/22 1239)  ondansetron (ZOFRAN) injection 4 mg (4 mg Intravenous Given 04/24/22 1240)  cefTRIAXone (ROCEPHIN) injection 1 g (1 g Intramuscular Given 04/24/22 1602)  lidocaine (PF) (XYLOCAINE) 1 % injection (2.1 mLs  Given 04/24/22 1602)    ED Course/ Medical Decision Making/ A&P Clinical Course as of 04/24/22 2156  Mon Apr 24, 2022  1318 Basic metabolic panel(!) Creatinine is actually improved compared to baseline 2 weeks ago.  There is no gross electrolyte derangement [HS]  1318 CBC with Differential(!) Leukocytosis but no anemia.  There is a left shift. [HS]  1318 Urinalysis, Routine w reflex microscopic -Urine, Clean Catch(!) UA is suggestive of underlying UTI [HS]  1319 US SCROTUM W/DOPPLER Large bilateral hydroceles, no evidence of  mass, torsion, acute process. [HS]    Clinical Course User Index [HS] Theron Arista, PA-C                             Medical Decision Making Amount and/or Complexity of Data Reviewed Labs: ordered. Decision-making details documented in ED Course. Radiology: ordered. Decision-making details documented in ED Course.  Risk Prescription drug management.   Patient is a 67 year old male presenting to the emergency department due to testicular pain.  Differential includes torsion, epididymitis, abscess, UTI, pyelonephritis, urethritis.    Testicle bilaterally appear enlarged, the right side is definitely more tender than the left.  Afebrile, no distress I do not think patient is septic.  Will start with pain medicine, antiemetics and labs and UA.  Also proceed with ultrasound to evaluate for testicular torsion.  I ordered, viewed interpreted laboratory and imaging workup as documented in the ED course.  Plan-will discharge patient with antibiotics, urology follow-up.  Ultrasound was negative for torsion, he does have a hydrocele.  Will treat UTI.  Patient stable for outpatient follow-up.  Discussed HPI, physical exam and plan of care for this patient with attending Arby Barrette. The attending physician evaluated this patient as part of a shared visit and agrees with plan of care.         Final Clinical Impression(s) / ED Diagnoses Final diagnoses:  Right inguinal pain  Hydrocele, unspecified hydrocele type  Acute cystitis without hematuria    Rx / DC Orders ED Discharge Orders          Ordered    doxycycline (VIBRAMYCIN) 100 MG capsule  2 times daily,   Status:  Discontinued        04/24/22 1342    ciprofloxacin (CIPRO) 500 MG tablet  Every 12 hours        04/24/22 1344              Theron Arista, PA-C 04/24/22 2156    Arby Barrette, MD 04/29/22 Rickey Primus

## 2022-04-24 NOTE — ED Provider Notes (Signed)
I provided a substantive portion of the care of this patient.  I personally made/approved the management plan for this patient and take responsibility for the patient management.     Patient reports testicular pain and swelling.  Patient reports he is in a monogamous relationship and does not have risk for STD exposure.  Patient is nontoxic.  Abdomen soft.  Scrotum moderate diffuse enlargement.  Penis normal.  No crepitus or pain in the groin folds or perineum.  Some hyperpigmentation of groin folds to suggest tinea but no maceration or stellate lesions.  Ultrasound shows hydroceles but otherwise normal exam.  Urinalysis is grossly positive for UTI.  I agree with initiating treatment for UTI and close follow-up.   Charlesetta Shanks, MD 04/24/22 1357

## 2022-05-07 ENCOUNTER — Other Ambulatory Visit: Payer: Self-pay | Admitting: Adult Health

## 2022-06-16 ENCOUNTER — Ambulatory Visit (INDEPENDENT_AMBULATORY_CARE_PROVIDER_SITE_OTHER): Payer: Medicaid Other | Admitting: Internal Medicine

## 2022-06-16 ENCOUNTER — Encounter: Payer: Self-pay | Admitting: Internal Medicine

## 2022-06-16 VITALS — BP 136/80 | HR 87 | Ht 71.0 in | Wt 201.0 lb

## 2022-06-16 DIAGNOSIS — Z794 Long term (current) use of insulin: Secondary | ICD-10-CM | POA: Diagnosis not present

## 2022-06-16 DIAGNOSIS — N1832 Chronic kidney disease, stage 3b: Secondary | ICD-10-CM | POA: Diagnosis not present

## 2022-06-16 DIAGNOSIS — E1159 Type 2 diabetes mellitus with other circulatory complications: Secondary | ICD-10-CM | POA: Diagnosis not present

## 2022-06-16 DIAGNOSIS — E1122 Type 2 diabetes mellitus with diabetic chronic kidney disease: Secondary | ICD-10-CM | POA: Diagnosis not present

## 2022-06-16 DIAGNOSIS — E119 Type 2 diabetes mellitus without complications: Secondary | ICD-10-CM | POA: Insufficient documentation

## 2022-06-16 DIAGNOSIS — R7989 Other specified abnormal findings of blood chemistry: Secondary | ICD-10-CM

## 2022-06-16 LAB — POCT GLYCOSYLATED HEMOGLOBIN (HGB A1C): Hemoglobin A1C: 6.9 % — AB (ref 4.0–5.6)

## 2022-06-16 LAB — POCT GLUCOSE (DEVICE FOR HOME USE): POC Glucose: 92 mg/dl (ref 70–99)

## 2022-06-16 MED ORDER — SEMAGLUTIDE(0.25 OR 0.5MG/DOS) 2 MG/3ML ~~LOC~~ SOPN: 0.5000 mg | PEN_INJECTOR | SUBCUTANEOUS | 2 refills | Status: DC

## 2022-06-16 MED ORDER — "PEN NEEDLES 3/16"" 31G X 5 MM MISC": 20.0000 [IU] | Freq: Two times a day (BID) | 3 refills | Status: DC

## 2022-06-16 MED ORDER — INSULIN DETEMIR 100 UNIT/ML FLEXPEN: 14.0000 [IU] | PEN_INJECTOR | Freq: Every day | SUBCUTANEOUS | 6 refills | Status: DC

## 2022-06-16 NOTE — Patient Instructions (Signed)
Decrease Levemir to 14 units daily  Start Ozempic 0.25 mg weekly for 6 weeks, than increase to 0.5 mg weekly after that      HOW TO TREAT LOW BLOOD SUGARS (Blood sugar LESS THAN 70 MG/DL) Please follow the RULE OF 15 for the treatment of hypoglycemia treatment (when your (blood sugars are less than 70 mg/dL)   STEP 1: Take 15 grams of carbohydrates when your blood sugar is low, which includes:  3-4 GLUCOSE TABS  OR 3-4 OZ OF JUICE OR REGULAR SODA OR ONE TUBE OF GLUCOSE GEL    STEP 2: RECHECK blood sugar in 15 MINUTES STEP 3: If your blood sugar is still low at the 15 minute recheck --> then, go back to STEP 1 and treat AGAIN with another 15 grams of carbohydrates.

## 2022-06-16 NOTE — Progress Notes (Signed)
Name: Curtis Saunders  MRN/ DOB: [id number], 10/05/1955   Age/ Sex: 67 y.o., male    PCP: Rema Fendt, NP   Reason for Endocrinology Evaluation: Type 2 Diabetes Mellitus     Date of Initial Endocrinology Visit: 06/16/2022     PATIENT IDENTIFIER: Curtis Saunders is a 67 y.o. male with a past medical history of DM, HTN, CVA. The patient presented for initial endocrinology clinic visit on 06/16/2022 for consultative assistance with his diabetes management.    HPI: Curtis Saunders was    Diagnosed with DM 06/2021, pt presented to the ED with severe hyperglycemia >600 mg/dL in 02/9560, he was discharged on insulin mix , he did have DKA at the time. Prior Medications tried/Intolerance: levemir  Currently checking blood sugars 1 x / day, Hypoglycemia episodes : no              Hemoglobin A1c has ranged from 7.3% in 2023, peaking at 14.4% in 2024.   In terms of diet, the patient eats 1 meal, avoids sugar sweetened beverages , avoids sugar sweetened beverages   Patient reported to the ED 04/2022 for testicular pain and swelling  Denies constipation, denies nausea or diarrhea  Denies tingling of the feet His energy level is good    Adrenal HISTORY:  Patient had an abnormal cosyntropin stimulation test 03/15/2022 with a baseline cortisol of 8.5 UG/DL, with a 13-YQMVHQ cortisol at 17.1 UG/DL. He was hospitalized for constipation at the time  with decrease urine output, was noted with hypokalemia , serum cortisol was 2.8  Brain MRI and adrenal imagine negative     Pt follows with cardiology for LVH , EF 65-70     HOME DIABETES REGIMEN: Levemir 20 units twice daily- once daily      Statin: Yes ACE-I/ARB: yes   METER DOWNLOAD SUMMARYn/a   DIABETIC COMPLICATIONS: Microvascular complications:  CKD III Denies:  Last eye exam: Completed yrs ago   Macrovascular complications:  CVA Denies: CAD, PVD   PAST HISTORY: Past Medical History:  Past Medical  History:  Diagnosis Date   Diabetes mellitus (HCC)    Hypertension    Stroke (HCC) 2023   x 2, no deficits   Past Surgical History:  Past Surgical History:  Procedure Laterality Date   APPENDECTOMY     EYE SURGERY     HAND SURGERY     KNEE SURGERY     x3, x1 R   LIPOMA EXCISION Left 10/13/2019   Procedure: SHOULDER EXCISION LIPOMA AND ROTATOR CUFF REPAIR;  Surgeon: Jones Broom, MD;  Location: [facility];  Service: Orthopedics;  Laterality: Left;   TONSILLECTOMY      Social History:  reports that he has never smoked. He has never been exposed to tobacco smoke. He has never used smokeless tobacco. He reports current alcohol use. He reports that he does not use drugs. Family History:  Family History  Problem Relation Age of Onset   Heart attack Mother 50   COPD Mother    Emphysema Mother    Diabetes Father    Diabetes Sister    Hypertension Sister    Sleep apnea Neg Hx      HOME MEDICATIONS: Allergies as of 06/16/2022       Reactions   Bee Venom Anaphylaxis        Medication List        Accurate as of Jun 16, 2022  1:41 PM. If you have any questions, ask  your nurse or doctor.          STOP taking these medications    ciprofloxacin 500 MG tablet Commonly known as: CIPRO Stopped by: Scarlette Shorts, MD   docusate sodium 100 MG capsule Commonly known as: COLACE Stopped by: Scarlette Shorts, MD       TAKE these medications    Accu-Chek Aviva Plus w/Device Kit 1 each by Does not apply route 4 (four) times daily -  before meals and at bedtime.   Accu-Chek [employer] Kit 1 each by Does not apply route 4 (four) times daily -  before meals and at bedtime.   Accu-Chek FastClix Lancets Misc Apply topically.   Accu-Chek Guide test strip Generic drug: glucose blood Use as instructed   acetaminophen 325 MG tablet Commonly known as: TYLENOL Take 2 tablets (650 mg total) by mouth every 6 (six) hours as needed for mild pain  (or Fever >/= 101).   aspirin EC 81 MG tablet Take 1 tablet (81 mg total) by mouth daily. Swallow whole.   blood glucose meter kit and supplies Dispense based on patient and insurance preference. Use up to four times daily as directed. (FOR ICD-10 E10.9, E11.9).   insulin detemir 100 UNIT/ML FlexPen Commonly known as: LEVEMIR Inject 20 Units into the skin 2 (two) times daily.   Lactulose 20 GM/30ML Soln Take 15 mLs (10 g total) by mouth every 12 (twelve) hours as needed.   ondansetron 4 MG tablet Commonly known as: ZOFRAN Take 1 tablet (4 mg total) by mouth every 6 (six) hours as needed for nausea.   Pen Needles 3/16" 31G X 5 MM Misc 20 Units by Does not apply route 2 (two) times daily.   ramipril 10 MG capsule Commonly known as: ALTACE Take 10 mg by mouth daily.   rosuvastatin 20 MG tablet Commonly known as: CRESTOR TAKE 1 TABLET BY MOUTH ONCE DAILY . APPOINTMENT REQUIRED FOR FUTURE REFILLS   senna 8.6 MG Tabs tablet Commonly known as: SENOKOT Take 1 tablet (8.6 mg total) by mouth 2 (two) times daily.   tamsulosin 0.4 MG Caps capsule Commonly known as: FLOMAX Take 1 capsule (0.4 mg total) by mouth daily.         ALLERGIES: Allergies  Allergen Reactions   Bee Venom Anaphylaxis     REVIEW OF SYSTEMS: A comprehensive ROS was conducted with the patient and is negative except as per HPI    OBJECTIVE:   VITAL SIGNS: BP 136/80 (BP Location: Left Arm, Patient Position: Sitting, Cuff Size: Small)   Pulse 87   Ht 5\' 11"  (1.803 m)   Wt 201 lb (91.2 kg)   SpO2 96%   BMI 28.03 kg/m    PHYSICAL EXAM:  General: Pt appears well and is in NAD  Neck: General: Supple without adenopathy or carotid bruits. Thyroid: Thyroid size normal.  No goiter or nodules appreciated.   Lungs: Clear with good BS bilat   Heart: RRR   Abdomen:  soft, nontender  Extremities:  Lower extremities - No pretibial edema.   Neuro: MS is good with appropriate affect, pt is alert and Ox3     DM foot exam: 06/16/2022  The skin of the feet is without sores or ulcerations, with thickened and discolored nails  The pedal pulses are 2+ on right and 2+ on left. The sensation is intact to a screening 5.07, 10 gram monofilament bilaterally   DATA REVIEWED:  Lab Results  Component Value Date  HGBA1C 6.9 (A) 06/16/2022   HGBA1C 14.4 (H) 02/28/2022   HGBA1C 8.0 (A) 09/07/2021   Lab Results  Component Value Date   LDLCALC 49 03/11/2022   CREATININE 1.66 (H) 04/24/2022   No results found for: "MICRALBCREAT"  Lab Results  Component Value Date   CHOL 111 03/11/2022   HDL 52 03/11/2022   LDLCALC 49 03/11/2022   TRIG 50 03/11/2022   CHOLHDL 2.1 03/11/2022        Old records , labs and images have been reviewed.    ASSESSMENT / PLAN / RECOMMENDATIONS:   1) Type 2 Diabetes Mellitus, Optimally controlled, With CKD III and macrovascular complications - Most recent A1c of 6.9 %. Goal A1c < 7.0 %.    Plan: GENERAL: I have discussed with the patient the pathophysiology of diabetes. We went over the natural progression of the disease. We stressed the importance of lifestyle changes. I explained the complications associated with diabetes including retinopathy, nephropathy, neuropathy as well as increased risk of cardiovascular disease. We went over the benefit seen with glycemic control.   I explained to the patient that diabetic patients are at higher than normal risk for amputations.  Patient is asking if I will be able to take him off insulin, I did explain to the patient this is my first visit with him and more data will need to be available, but we can certainly reduce his insulin gradually He was referred to our CDE for further diabetes education and learning about diet, as he is not sure what he could or could not eat His A1c has trended down nicely from 14.4% to 6.9% Patient with DKA 02/2022, NOT a candidate for SGLT2 inhibitors He has no history of pancreatitis, and I  have recommended GLP-1 agonist, caution against GI side effects, he was trained by my CMA on injection technique and dosing He was provided with a sample pen of Ozempic  MEDICATIONS: Start Ozempic 0.25 mg weekly for 6 weeks, then increase to 0.5 mg weekly Decrease Levemir 14 units daily  EDUCATION / INSTRUCTIONS: BG monitoring instructions: Patient is instructed to check his blood sugars 1 times a day, fasting. Call [facility] if: BG persistently < 70  I reviewed the Rule of 15 for the treatment of hypoglycemia in detail with the patient. Literature supplied.   2) Diabetic complications:  Eye: Does not have known diabetic retinopathy.  Patient encouraged to have an eye exam Neuro/ Feet: Does not have known diabetic peripheral neuropathy. Renal: Patient does  have known baseline CKD. He is  on an ACEI/ARB at present.  3) low serum cortisol:  -Patient had low serum cortisol during hospitalization , cosyntropin stimulation test was slightly abnormal -Patient is asymptomatic at this time -I do suspect stress  and the cause for hospitalization may have impaired his HPA axis, as well as low albumin at the time. -I have offered to proceed with repeat cosyntropin stimulation test but patient would like to return next week        Signed electronically by: Lyndle Herrlich, MD  [facility]  [facility] [address] [place] [place], [place] [address] Phone: [phone] FAX: [phone]   CC: Rema Fendt, NP [address] [place] [address] Phone: [phone]  Fax: [phone]    Return to Endocrinology clinic as below: No future appointments.

## 2022-06-20 ENCOUNTER — Other Ambulatory Visit: Payer: Self-pay | Admitting: Adult Health

## 2022-06-21 ENCOUNTER — Ambulatory Visit: Payer: Medicaid Other

## 2022-06-23 ENCOUNTER — Other Ambulatory Visit: Payer: Medicaid Other

## 2022-06-28 ENCOUNTER — Encounter: Payer: Medicaid Other | Attending: Internal Medicine | Admitting: Nutrition

## 2022-06-28 ENCOUNTER — Telehealth: Payer: Self-pay | Admitting: Nutrition

## 2022-06-28 DIAGNOSIS — E0822 Diabetes mellitus due to underlying condition with diabetic chronic kidney disease: Secondary | ICD-10-CM | POA: Insufficient documentation

## 2022-06-28 DIAGNOSIS — E1165 Type 2 diabetes mellitus with hyperglycemia: Secondary | ICD-10-CM | POA: Insufficient documentation

## 2022-06-28 DIAGNOSIS — Z794 Long term (current) use of insulin: Secondary | ICD-10-CM | POA: Insufficient documentation

## 2022-06-28 DIAGNOSIS — N183 Chronic kidney disease, stage 3 unspecified: Secondary | ICD-10-CM | POA: Insufficient documentation

## 2022-06-28 NOTE — Telephone Encounter (Signed)
Patient reports that he has not heard from [facility] about his Ozempic.  I called them and they said that it requires a prior auth.

## 2022-06-28 NOTE — Progress Notes (Signed)
Patient says he was told to come here by the doctor to review his diet.  Denies eating sweets or any sugar Medications: Levemir: 14u 8 AM.  Says takes this every morning                      Ozempic:  says was given a sample and he took this.  Denies nausea or vomiting.  Says has not heard back from [facility].  I called them and they reported that this requires a prior auth. Note to [place] to start this.  Sample given to hold him over. SBGM:  meter:  says test blood sugar acB and "several times/day.  Says has not had a reading over 120,  FBS today was 118.  Does not want a cgm.   Exercise:  walks 2X/wk for 1 hour in the morning after breakfast.  Encouraged this and to increase this to 3-4 days/wk. Diet:  typical day: 6AM:  awake 7AM:  bfast:  Honey Nut Cheerios and milk, or eggs with grits- no bread 4-5PM: Supper:  cooks on the grill.  Has chicken-one breast, or thigh and leg, with 2 non starchy veg., and one starchy one.  Drinks water Denies eating after supper, during the night, or after breakfast. Discussion: Need to make sure all meals have protein, some form or carbohydrate and a small amount of fat.  What foods fall into each of these 3 categories, why he needs these 3 groups of foods, and how much of each he should be eating.   Need for snack about 1PM.  Suggested one ounce of protein and one serving of carbohydrate.  List of appropriate snack options give to him. Discussed how exercise helps decrease insulin resistance and suggested 3-4 days/wk, of 30-40 minutes Believe overall understanding is good but motivation is lacking for needed dietary changes.  Suggested to him that if he looses more weight, he should eat another balanced meal at 12PM and move his supper to 5:30PM.

## 2022-06-29 ENCOUNTER — Other Ambulatory Visit (INDEPENDENT_AMBULATORY_CARE_PROVIDER_SITE_OTHER): Payer: Medicaid Other

## 2022-06-29 ENCOUNTER — Telehealth: Payer: Self-pay

## 2022-06-29 ENCOUNTER — Other Ambulatory Visit (HOSPITAL_COMMUNITY): Payer: Self-pay

## 2022-06-29 DIAGNOSIS — E1159 Type 2 diabetes mellitus with other circulatory complications: Secondary | ICD-10-CM

## 2022-06-29 DIAGNOSIS — R7989 Other specified abnormal findings of blood chemistry: Secondary | ICD-10-CM

## 2022-06-29 DIAGNOSIS — Z794 Long term (current) use of insulin: Secondary | ICD-10-CM | POA: Diagnosis not present

## 2022-06-29 NOTE — Telephone Encounter (Signed)
Patient Advocate Encounter   Received notification from pt msgs that prior authorization is required for Ozempic  Submitted: 06/29/22 Key [id number]  Status is pending

## 2022-06-29 NOTE — Telephone Encounter (Signed)
Pharmacy Patient Advocate Encounter  Prior Authorization has been APPROVED   Effective through 06/29/2023

## 2022-06-30 LAB — T4, FREE: Free T4: 0.96 ng/dL (ref 0.60–1.60)

## 2022-06-30 LAB — TSH: TSH: 1.42 u[IU]/mL (ref 0.35–5.50)

## 2022-07-02 LAB — ACTH: C206 ACTH: 6 pg/mL (ref 6–50)

## 2022-07-04 ENCOUNTER — Encounter: Payer: Self-pay | Admitting: Internal Medicine

## 2022-07-04 ENCOUNTER — Telehealth: Payer: Self-pay | Admitting: Nutrition

## 2022-07-04 NOTE — Patient Instructions (Addendum)
Test blood sugar 2 hours after eating cereal.  If reading is over 200, stop this Exercise 4 days/wk for 30-40 minutes.   Have snack of 1 ounces of protein and 1 serving of carbohydrate from list given around 12PM

## 2022-07-04 NOTE — Telephone Encounter (Signed)
Patient said he was told he needs to come in for a test that will take 1 1/2 hour.  Please advise and call him back to schedule this.

## 2022-07-05 ENCOUNTER — Other Ambulatory Visit (INDEPENDENT_AMBULATORY_CARE_PROVIDER_SITE_OTHER): Payer: Medicaid Other

## 2022-07-05 ENCOUNTER — Ambulatory Visit (INDEPENDENT_AMBULATORY_CARE_PROVIDER_SITE_OTHER): Payer: Medicaid Other

## 2022-07-05 VITALS — BP 120/70 | Ht 71.0 in | Wt 201.0 lb

## 2022-07-05 DIAGNOSIS — Z794 Long term (current) use of insulin: Secondary | ICD-10-CM | POA: Diagnosis not present

## 2022-07-05 DIAGNOSIS — R7989 Other specified abnormal findings of blood chemistry: Secondary | ICD-10-CM | POA: Diagnosis not present

## 2022-07-05 DIAGNOSIS — E1159 Type 2 diabetes mellitus with other circulatory complications: Secondary | ICD-10-CM

## 2022-07-05 LAB — MICROALBUMIN / CREATININE URINE RATIO
Creatinine,U: 171.5 mg/dL
Microalb Creat Ratio: 6.1 mg/g (ref 0.0–30.0)
Microalb, Ur: 10.4 mg/dL — ABNORMAL HIGH (ref 0.0–1.9)

## 2022-07-05 MED ORDER — COSYNTROPIN 0.25 MG IJ SOLR
0.2500 mg | Freq: Once | INTRAMUSCULAR | Status: AC
  Administered 2022-07-05: 0.25 mg via INTRAVENOUS

## 2022-07-05 NOTE — Telephone Encounter (Signed)
Left vm for patient to callback and get schedule for the cortisol test.

## 2022-07-05 NOTE — Progress Notes (Signed)
After obtaining consent, and per orders of Dr. Lonzo Cloud , injection of  Cosyntropin given by Kevona Lupinacci L Delynn Olvera in right arm .

## 2022-07-06 LAB — CORTISOL
Cortisol, Plasma: 3.1 ug/dL
Cortisol, Plasma: 12.9 ug/dL
Cortisol, Plasma: 8.7 ug/dL

## 2022-07-07 ENCOUNTER — Telehealth: Payer: Self-pay | Admitting: Internal Medicine

## 2022-07-07 DIAGNOSIS — E2740 Unspecified adrenocortical insufficiency: Secondary | ICD-10-CM

## 2022-07-07 MED ORDER — PREDNISONE 5 MG PO TABS: 5.0000 mg | ORAL_TABLET | Freq: Every day | ORAL | 3 refills | Status: DC

## 2022-07-07 NOTE — Telephone Encounter (Signed)
Please let the patient know that unfortunately he failed the cosyntropin test   This means that his body is not able to give him enough cortisol, cortisol is a stress hormone so his body will not be able to fight if he goes into an infection or extreme stress.   Patient will need to be on prednisone 5 mg once daily from now on  He needs to take it every morning with breakfast

## 2022-07-07 NOTE — Telephone Encounter (Signed)
Patient has been advised and verbalized understanding. He will pick up medication

## 2022-07-07 NOTE — Telephone Encounter (Signed)
I called and spoke with the patient and explained to him again about the new prescription Prednisone.

## 2022-07-07 NOTE — Telephone Encounter (Addendum)
Patient states he has questions about new medication that was Rx and how long will he be taking this medication. He also states he has further questions.Curtis Saunders

## 2022-08-08 ENCOUNTER — Encounter: Payer: Self-pay | Admitting: Family

## 2022-08-08 ENCOUNTER — Ambulatory Visit (INDEPENDENT_AMBULATORY_CARE_PROVIDER_SITE_OTHER): Payer: Medicaid Other | Admitting: Family

## 2022-08-08 VITALS — BP 113/72 | HR 88 | Temp 98.0°F | Ht 72.25 in | Wt 204.0 lb

## 2022-08-08 DIAGNOSIS — E785 Hyperlipidemia, unspecified: Secondary | ICD-10-CM

## 2022-08-08 MED ORDER — ROSUVASTATIN CALCIUM 20 MG PO TABS
20.0000 mg | ORAL_TABLET | Freq: Every day | ORAL | 0 refills | Status: DC
Start: 2022-08-08 — End: 2022-11-03

## 2022-08-08 NOTE — Progress Notes (Signed)
Patient ID: Curtis Saunders, male    DOB: August 20, 1955  MRN: [id number]  CC: Medication Refill   Subjective: Curtis Saunders is a 67 y.o. male who presents for medication refill.  His concerns today include:  - Needs refills of Rosuvastatin, no issues/concerns. Established with Neurology for history of stroke and hyperlipidemia. Patient aware to keep all scheduled appointments.  - Established with Endocrinology for diabetes management. Patient aware to keep all scheduled appointments.  - No further issues/concerns for discussion today.   Patient Active Problem List   Diagnosis Date Noted   Diabetes mellitus (HCC) 06/16/2022   Low serum cortisol level 06/16/2022   Benign essential HTN 03/11/2022   Uncontrolled type 2 diabetes mellitus with hyperglycemia, with long-term current use of insulin (HCC) 03/11/2022   Acute urinary retention 03/11/2022   Obesity (BMI 30-39.9) 03/11/2022   Acute retention of urine 03/11/2022   Acute kidney injury superimposed on chronic kidney disease (HCC) 03/01/2022   New onset type 2 diabetes mellitus (HCC) 02/28/2022   BPH (benign prostatic hyperplasia) 02/28/2022   Pain in joint of right shoulder 01/09/2022   Partial thickness rotator cuff tear 01/09/2022   Osteoarthritis of right knee 11/08/2021   Pain in joint of right knee 11/08/2021   Low back pain 07/14/2021   Pain in joint of left shoulder 08/12/2020   Stage 3 chronic kidney disease (HCC) 04/12/2020   CVA (cerebral vascular accident) (HCC) 02/27/2020   Ascending aortic aneurysm (HCC) 02/11/2020   Double pterygium, 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     aspirin EC 81 MG EC tablet Take 1 tablet (81 mg total) by  mouth daily. Swallow whole. 30 tablet 11   blood glucose meter kit and supplies Dispense based on patient and insurance preference. Use up to four times daily as directed. (FOR ICD-10 E10.9, E11.9). 1 each 0   Blood Glucose Monitoring Suppl (ACCU-CHEK AVIVA PLUS) w/Device KIT 1 each by Does not apply route 4 (four) times daily -  before meals and at bedtime. 1 kit 0   glucose blood (ACCU-CHEK GUIDE) test strip Use as instructed 100 each 2   insulin detemir (LEVEMIR) 100 UNIT/ML FlexPen Inject 14 Units into the skin daily. 15 mL 6   Insulin Pen Needle (PEN NEEDLES 3/16") 31G X 5 MM MISC 20 Units by Does not apply route 2 (two) times daily. 100 each 3   Lancets Misc. (ACCU-CHEK FASTCLIX LANCET) KIT 1 each by Does not apply route 4 (four) times daily -  before meals and at bedtime. 1 kit 2   ramipril (ALTACE) 10 MG capsule Take 10 mg by mouth daily.     tamsulosin (FLOMAX) 0.4 MG CAPS capsule Take 1 capsule (0.4 mg total) by mouth daily. 30 capsule 3   acetaminophen (TYLENOL) 325 MG tablet Take 2 tablets (650 mg total) by mouth every 6 (six) hours as needed for mild pain (or Fever >/= 101). (Patient not taking: Reported on 08/08/2022)     Lactulose 20 GM/30ML SOLN Take 15 mLs (10 g total) by mouth every 12 (twelve) hours as needed. (Patient not taking: Reported on 08/08/2022) 450 mL 237   ondansetron (ZOFRAN) 4 MG tablet Take 1 tablet (4 mg total) by mouth every 6 (  six) hours as needed for nausea. (Patient not taking: Reported on 08/08/2022) 20 tablet 0   predniSONE (DELTASONE) 5 MG tablet Take 1 tablet (5 mg total) by mouth daily with breakfast. (Patient not taking: Reported on 08/08/2022) 90 tablet 3   Semaglutide,0.25 or 0.5MG /DOS, 2 MG/3ML SOPN Inject 0.5 mg into the skin once a week. (Patient not taking: Reported on 08/08/2022) 9 mL 2   senna (SENOKOT) 8.6 MG TABS tablet Take 1 tablet (8.6 mg total) by mouth 2 (two) times daily. (Patient not taking: Reported on 08/08/2022) 120 tablet 0   No current  facility-administered medications on file prior to visit.    Allergies  Allergen Reactions   Bee Venom Anaphylaxis    Social History   Socioeconomic History   Marital status: Single    Spouse name: Not on file   Number of children: Not on file   Years of education: Not on file   Highest education level: Not on file  Occupational History   Occupation: retired  Tobacco Use   Smoking status: Never    Passive exposure: Never   Smokeless tobacco: Never  Vaping Use   Vaping status: Never Used  Substance and Sexual Activity   Alcohol use: Yes    Comment: occasional   Drug use: No   Sexual activity: Yes  Other Topics Concern   Not on file  Social History Narrative   ** Merged History Encounter **       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     Social Determinants of Health   Financial Resource Strain: Not on file  Food Insecurity: No Food Insecurity (03/01/2022)   Hunger Vital Sign    Worried About Running Out of Food in the Last Year: Never true    Ran Out of Food in the Last Year: Never true  Transportation Needs: No Transportation Needs (03/01/2022)   PRAPARE - Administrator, Civil Service (Medical): No    Lack of Transportation (Non-Medical): No  Physical Activity: Not on file  Stress: Not on file  Social Connections: Not on file  Intimate Partner Violence: Not At Risk (03/01/2022)   Humiliation, Afraid, Rape, and Kick questionnaire    Fear of Current or Ex-Partner: No    Emotionally Abused: No    Physically Abused: No    Sexually Abused: No    Family History  Problem Relation Age of Onset   Heart attack Mother 17   COPD Mother    Emphysema Mother    Diabetes Father    Diabetes Sister    Hypertension Sister    Sleep apnea Neg Hx     Past Surgical History:  Procedure Laterality Date   APPENDECTOMY     EYE SURGERY     HAND SURGERY     KNEE SURGERY     x3, x1 R   LIPOMA EXCISION Left 10/13/2019   Procedure: SHOULDER EXCISION LIPOMA AND  ROTATOR CUFF REPAIR;  Surgeon: Jones Broom, MD;  Location:  SUR[facility];  Service: Orthopedics;  Laterality: Left;   TONSILLECTOMY      ROS: Review of Systems Negative except as stated above  PHYSICAL EXAM: BP 113/72   Pulse 88   Temp 98 F (36.7 C)   Ht 6' 0.25" (1.835 m)   Wt 204 lb (92.5 kg)   SpO2 98%   BMI 27.48 kg/m   Physical Exam HENT:     Head: Normocephalic and atraumatic.     Nose: Nose  normal.     Mouth/Throat:     Mouth: Mucous membranes are moist.     Pharynx: Oropharynx is clear.  Eyes:     Extraocular Movements: Extraocular movements intact.     Conjunctiva/sclera: Conjunctivae normal.     Pupils: Pupils are equal, round, and reactive to light.  Cardiovascular:     Rate and Rhythm: Normal rate and regular rhythm.     Pulses: Normal pulses.     Heart sounds: Normal heart sounds.  Pulmonary:     Effort: Pulmonary effort is normal.     Breath sounds: Normal breath sounds.  Musculoskeletal:        General: Normal range of motion.     Cervical back: Normal range of motion and neck supple.  Neurological:     General: No focal deficit present.     Mental Status: He is alert and oriented to person, place, and time.  Psychiatric:        Mood and Affect: Mood normal.        Behavior: Behavior normal.       ASSESSMENT AND PLAN: 1. Hyperlipidemia, unspecified hyperlipidemia type - Continue Rosuvastatin as prescribed. Counseled on medication adherence/adverse effects.  - Keep all scheduled appointments with Neurology.  - Follow-up with primary provider as scheduled.  - rosuvastatin (CRESTOR) 20 MG tablet; Take 1 tablet (20 mg total) by mouth daily.  Dispense: 90 tablet; Refill: 0    Patient was given the opportunity to ask questions.  Patient verbalized understanding of the plan and was able to repeat key elements of the plan. Patient was given clear instructions to go to Emergency Department or return to medical center if symptoms don't  improve, worsen, or new problems develop.The patient verbalized understanding.   Requested Prescriptions   Signed Prescriptions Disp Refills   rosuvastatin (CRESTOR) 20 MG tablet 90 tablet 0    Sig: Take 1 tablet (20 mg total) by mouth daily.    Follow-up with primary provider as scheduled.   Rema Fendt, NP

## 2022-08-08 NOTE — Progress Notes (Signed)
Pt is on levemir and wants to get off and get on ozempic.  Pt needs refill on rosuvastatin

## 2022-08-18 ENCOUNTER — Emergency Department (HOSPITAL_COMMUNITY): Payer: Medicaid Other

## 2022-08-18 ENCOUNTER — Other Ambulatory Visit: Payer: Self-pay

## 2022-08-18 ENCOUNTER — Emergency Department (HOSPITAL_COMMUNITY)
Admission: EM | Admit: 2022-08-18 | Discharge: 2022-08-18 | Disposition: A | Discharge status: Home / Self Care | Payer: Medicaid Other | Attending: Emergency Medicine | Admitting: Emergency Medicine

## 2022-08-18 ENCOUNTER — Encounter (HOSPITAL_COMMUNITY): Payer: Self-pay

## 2022-08-18 DIAGNOSIS — R200 Anesthesia of skin: Secondary | ICD-10-CM | POA: Diagnosis present

## 2022-08-18 DIAGNOSIS — R531 Weakness: Secondary | ICD-10-CM | POA: Insufficient documentation

## 2022-08-18 DIAGNOSIS — Z5329 Procedure and treatment not carried out because of patient's decision for other reasons: Secondary | ICD-10-CM | POA: Insufficient documentation

## 2022-08-18 DIAGNOSIS — Z5321 Procedure and treatment not carried out due to patient leaving prior to being seen by health care provider: Secondary | ICD-10-CM

## 2022-08-18 LAB — COMPREHENSIVE METABOLIC PANEL
ALT: 16 U/L (ref 0–44)
AST: 14 U/L — ABNORMAL LOW (ref 15–41)
Albumin: 3.5 g/dL (ref 3.5–5.0)
Alkaline Phosphatase: 68 U/L (ref 38–126)
Anion gap: 7 (ref 5–15)
BUN: 17 mg/dL (ref 8–23)
CO2: 24 mmol/L (ref 22–32)
Calcium: 9.2 mg/dL (ref 8.9–10.3)
Chloride: 108 mmol/L (ref 98–111)
Creatinine, Ser: 1.47 mg/dL — ABNORMAL HIGH (ref 0.61–1.24)
GFR, Estimated: 53 mL/min — ABNORMAL LOW (ref >60)
Glucose, Bld: 87 mg/dL (ref 70–99)
Potassium: 3.8 mmol/L (ref 3.5–5.1)
Sodium: 139 mmol/L (ref 135–145)
Total Bilirubin: 0.4 mg/dL (ref 0.3–1.2)
Total Protein: 6.7 g/dL (ref 6.5–8.1)

## 2022-08-18 LAB — RAPID URINE DRUG SCREEN, HOSP PERFORMED
Amphetamines: NOT DETECTED
Barbiturates: NOT DETECTED
Benzodiazepines: NOT DETECTED
Cocaine: NOT DETECTED
Opiates: NOT DETECTED
Tetrahydrocannabinol: NOT DETECTED

## 2022-08-18 LAB — CBC
HCT: 37.5 % — ABNORMAL LOW (ref 39.0–52.0)
Hemoglobin: 11.9 g/dL — ABNORMAL LOW (ref 13.0–17.0)
MCH: 28.1 pg (ref 26.0–34.0)
MCHC: 31.7 g/dL (ref 30.0–36.0)
MCV: 88.7 fL (ref 80.0–100.0)
Platelets: 231 10*3/uL (ref 150–400)
RBC: 4.23 MIL/uL (ref 4.22–5.81)
RDW: 14.9 % (ref 11.5–15.5)
WBC: 8.5 10*3/uL (ref 4.0–10.5)
nRBC: 0 % (ref 0.0–0.2)

## 2022-08-18 LAB — DIFFERENTIAL
Abs Immature Granulocytes: 0.04 10*3/uL (ref 0.00–0.07)
Basophils Absolute: 0 10*3/uL (ref 0.0–0.1)
Basophils Relative: 0 %
Eosinophils Absolute: 0.2 10*3/uL (ref 0.0–0.5)
Eosinophils Relative: 3 %
Immature Granulocytes: 1 %
Lymphocytes Relative: 24 %
Lymphs Abs: 2 10*3/uL (ref 0.7–4.0)
Monocytes Absolute: 0.5 10*3/uL (ref 0.1–1.0)
Monocytes Relative: 6 %
Neutro Abs: 5.7 10*3/uL (ref 1.7–7.7)
Neutrophils Relative %: 66 %

## 2022-08-18 LAB — I-STAT CHEM 8, ED
BUN: 19 mg/dL (ref 8–23)
Calcium, Ion: 1.3 mmol/L (ref 1.15–1.40)
Chloride: 106 mmol/L (ref 98–111)
Creatinine, Ser: 1.6 mg/dL — ABNORMAL HIGH (ref 0.61–1.24)
Glucose, Bld: 82 mg/dL (ref 70–99)
HCT: 36 % — ABNORMAL LOW (ref 39.0–52.0)
Hemoglobin: 12.2 g/dL — ABNORMAL LOW (ref 13.0–17.0)
Potassium: 3.8 mmol/L (ref 3.5–5.1)
Sodium: 143 mmol/L (ref 135–145)
TCO2: 24 mmol/L (ref 22–32)

## 2022-08-18 LAB — URINALYSIS, ROUTINE W REFLEX MICROSCOPIC
Bilirubin Urine: NEGATIVE
Glucose, UA: NEGATIVE mg/dL
Hgb urine dipstick: NEGATIVE
Ketones, ur: NEGATIVE mg/dL
Nitrite: NEGATIVE
Protein, ur: NEGATIVE mg/dL
Specific Gravity, Urine: 1.019 (ref 1.005–1.030)
pH: 5 (ref 5.0–8.0)

## 2022-08-18 LAB — PROTIME-INR
INR: 1 (ref 0.8–1.2)
Prothrombin Time: 13 seconds (ref 11.4–15.2)

## 2022-08-18 LAB — APTT: aPTT: 36 seconds (ref 24–36)

## 2022-08-18 LAB — ETHANOL: Alcohol, Ethyl (B): <10 mg/dL (ref <10)

## 2022-08-18 NOTE — ED Provider Triage Note (Signed)
Emergency Medicine Provider Triage Evaluation Note  Curtis Saunders , a 67 y.o. male  was evaluated in triage.  Pt complains of left arm weakness.  Patient states he awoke with left arm weakness 3 days ago.  He denies any other deficits.  He has a history of surgery to the left shoulder but states that he does not know what it is for.  He has a history of strokes.  Patient states that he thought he "just slept on it funny."  He states he cannot hold anything in his left hand because his grip is so weak.Marland Kitchen  He also complains of pain in the left shoulder  Review of Systems  Positive: Left arm weakness Negative: Headache  Physical Exam  BP 127/88   Pulse 71   Temp 98.7 F (37.1 C) (Oral)   Resp 16   Ht 6' 0.25" (1.835 m)   Wt 92.5 kg   SpO2 100%   BMI 27.47 kg/m  Gen:   Awake, no distress   Resp:  Normal effort  MSK:   Moves extremities without difficulty  Other:  Left arm appears globally atrophied.  It is weak compared to the right.  He has minimal grip strength.  Negative for clonus in the fingers.  Medical Decision Making  Medically screening exam initiated at 3:10 PM.  Appropriate orders placed.  Darragh Karder Swasey was informed that the remainder of the evaluation will be completed by another provider, this initial triage assessment does not replace that evaluation, and the importance of remaining in the ED until their evaluation is complete.  Patient with left arm weakness.  Differential includes recurrent stroke versus myelopathy.  Orders placed Is out of the window for a code stroke due to onset 3 days ago   Arthor Captain, [place] 08/18/22 1513

## 2022-08-18 NOTE — ED Triage Notes (Signed)
Pt c/o left arm and hand numbnessx3d.

## 2022-08-18 NOTE — ED Notes (Signed)
Urine collect prior to my assignment

## 2022-08-24 NOTE — ED Provider Notes (Signed)
This patient eloped from the lobby prior to being evaluated.   Glyn Ade, MD 08/24/22 1622

## 2022-08-24 NOTE — Progress Notes (Signed)
  Cardiology Office Note:   Date:  08/25/2022  ID:  Curtis Saunders, DOB 09-04-1955, MRN [id number] PCP: Rema Fendt, NP  [facility] Providers Cardiologist:  None {  History of Present Illness:   Curtis Saunders is a 67 y.o. male who was referred by ED for evaluation of chest pain.  He was in the ED in late Jan 2021 for chest pain.    There was no evidence objective evidence of ischemia.   The patient had moderate LVH on echo.  He was noted there to have negative cardiac enzymes.  CT of the chest demonstrated no acute abnormalities.  Apparently because he had headaches he also had a CT of his head which demonstrated no acute abnormalities.  He was treated with morphine, ketorolac and nitroglycerin.    After seeing me I sent him for a coronary CTA and he had no coronary calcium and NL coronaries.  He does have an ascending aortic aneurysm.   In Feb 2022 he was hospitalized with acute CVA.  There was an MRI with posterior limb internal capsule acute infarct.  Echo demonstrated severe LVH.  He was treated with ASA and Plavix.     Since he was last seen he has done okay.  He lives with his girlfriend and his [place] husky named O.  The patient denies any new symptoms such as chest discomfort, neck or arm discomfort. There has been no new shortness of breath, PND or orthopnea. There have been no reported palpitations, presyncope or syncope.   He walks for exercise.  ROS: As stated in the HPI and negative for all other systems.  Studies Reviewed:    EKG:   NA  Risk Assessment/Calculations:              Physical Exam:   VS:  BP 118/84   Pulse 81   Ht 6' (1.829 m)   Wt 204 lb (92.5 kg)   SpO2 97%   BMI 27.67 kg/m    Wt Readings from Last 3 Encounters:  08/25/22 204 lb (92.5 kg)  08/18/22 203 lb 14.8 oz (92.5 kg)  08/08/22 204 lb (92.5 kg)     GEN: Well nourished, well developed in no acute distress NECK: No JVD; No carotid bruits CARDIAC: RRR, no murmurs,  rubs, gallops RESPIRATORY:  Clear to auscultation without rales, wheezing or rhonchi  ABDOMEN: Soft, non-tender, non-distended EXTREMITIES:  No edema; No deformity   ASSESSMENT AND PLAN:   HTN:    Blood pressure is well-controlled.  No change in therapy.  LVH:    MRI did not suggest infiltrative disease.  We described this to some poorly controlled hypertension although it has been well-controlled now.  I am going to check an echocardiogram.    AORTIC ANEURYSM:  This was 4.0 cm on CT.   This was not prominent on MRI.     CKD II:   Creatinine was 1.6 which is lower than his peak and at around his baseline.  He is followed by nephrology.  No change in therapy.      Follow up me in one year.   Signed, Rollene Rotunda, MD

## 2022-08-25 ENCOUNTER — Ambulatory Visit: Payer: No Typology Code available for payment source | Attending: Cardiology | Admitting: Cardiology

## 2022-08-25 ENCOUNTER — Encounter: Payer: Self-pay | Admitting: Cardiology

## 2022-08-25 VITALS — BP 118/84 | HR 81 | Ht 72.0 in | Wt 204.0 lb

## 2022-08-25 DIAGNOSIS — I7121 Aneurysm of the ascending aorta, without rupture: Secondary | ICD-10-CM | POA: Diagnosis not present

## 2022-08-25 DIAGNOSIS — I517 Cardiomegaly: Secondary | ICD-10-CM

## 2022-08-25 DIAGNOSIS — I10 Essential (primary) hypertension: Secondary | ICD-10-CM | POA: Diagnosis not present

## 2022-08-25 NOTE — Patient Instructions (Signed)
    Testing/Procedures:  Your physician has requested that you have an echocardiogram. Echocardiography is a painless test that uses sound waves to create images of your heart. It provides your doctor with information about the size and shape of your heart and how well your heart's chambers and valves are working. This procedure takes approximately one hour. There are no restrictions for this procedure. Please do NOT wear cologne, perfume, aftershave, or lotions (deodorant is allowed). Please arrive 15 minutes prior to your appointment time. [address]   Follow-Up: At [facility], you and your health needs are our priority.  As part of our continuing mission to provide you with exceptional heart care, we have created designated Provider Care Teams.  These Care Teams include your primary Cardiologist (physician) and Advanced Practice Providers (APPs -  Physician Assistants and Nurse Practitioners) who all work together to provide you with the care you need, when you need it.  We recommend signing up for the patient portal called "MyChart".  Sign up information is provided on this After Visit Summary.  MyChart is used to connect with patients for Virtual Visits (Telemedicine).  Patients are able to view lab/test results, encounter notes, upcoming appointments, etc.  Non-urgent messages can be sent to your provider as well.   To learn more about what you can do with MyChart, go to [email or web address].    Your next appointment:   12 month(s)  Provider:   Rollene Rotunda MD

## 2022-09-14 ENCOUNTER — Ambulatory Visit (HOSPITAL_COMMUNITY): Payer: Medicaid Other | Attending: Cardiology

## 2022-09-14 ENCOUNTER — Encounter (HOSPITAL_COMMUNITY): Payer: Self-pay | Admitting: Cardiology

## 2022-09-28 ENCOUNTER — Emergency Department (HOSPITAL_COMMUNITY): Payer: Medicaid Other

## 2022-09-28 ENCOUNTER — Encounter (HOSPITAL_COMMUNITY): Payer: Self-pay

## 2022-09-28 ENCOUNTER — Emergency Department (HOSPITAL_COMMUNITY)
Admission: EM | Admit: 2022-09-28 | Discharge: 2022-09-28 | Disposition: A | Discharge status: Home / Self Care | Payer: Medicaid Other | Attending: Emergency Medicine | Admitting: Emergency Medicine

## 2022-09-28 ENCOUNTER — Other Ambulatory Visit: Payer: Self-pay

## 2022-09-28 DIAGNOSIS — N50811 Right testicular pain: Secondary | ICD-10-CM | POA: Diagnosis present

## 2022-09-28 DIAGNOSIS — Z7982 Long term (current) use of aspirin: Secondary | ICD-10-CM | POA: Insufficient documentation

## 2022-09-28 DIAGNOSIS — N433 Hydrocele, unspecified: Secondary | ICD-10-CM | POA: Insufficient documentation

## 2022-09-28 DIAGNOSIS — Z794 Long term (current) use of insulin: Secondary | ICD-10-CM | POA: Insufficient documentation

## 2022-09-28 DIAGNOSIS — I10 Essential (primary) hypertension: Secondary | ICD-10-CM | POA: Diagnosis not present

## 2022-09-28 DIAGNOSIS — Z8673 Personal history of transient ischemic attack (TIA), and cerebral infarction without residual deficits: Secondary | ICD-10-CM | POA: Insufficient documentation

## 2022-09-28 DIAGNOSIS — N5089 Other specified disorders of the male genital organs: Secondary | ICD-10-CM

## 2022-09-28 DIAGNOSIS — Z79899 Other long term (current) drug therapy: Secondary | ICD-10-CM | POA: Insufficient documentation

## 2022-09-28 LAB — CBC WITH DIFFERENTIAL/PLATELET
Abs Immature Granulocytes: 0.04 10*3/uL (ref 0.00–0.07)
Basophils Absolute: 0 10*3/uL (ref 0.0–0.1)
Basophils Relative: 0 %
Eosinophils Absolute: 0.2 10*3/uL (ref 0.0–0.5)
Eosinophils Relative: 2 %
HCT: 39.3 % (ref 39.0–52.0)
Hemoglobin: 12.7 g/dL — ABNORMAL LOW (ref 13.0–17.0)
Immature Granulocytes: 0 %
Lymphocytes Relative: 19 %
Lymphs Abs: 2 10*3/uL (ref 0.7–4.0)
MCH: 28.7 pg (ref 26.0–34.0)
MCHC: 32.3 g/dL (ref 30.0–36.0)
MCV: 88.9 fL (ref 80.0–100.0)
Monocytes Absolute: 0.5 10*3/uL (ref 0.1–1.0)
Monocytes Relative: 5 %
Neutro Abs: 7.7 10*3/uL (ref 1.7–7.7)
Neutrophils Relative %: 74 %
Platelets: 173 10*3/uL (ref 150–400)
RBC: 4.42 MIL/uL (ref 4.22–5.81)
RDW: 14.2 % (ref 11.5–15.5)
WBC: 10.6 10*3/uL — ABNORMAL HIGH (ref 4.0–10.5)
nRBC: 0 % (ref 0.0–0.2)

## 2022-09-28 LAB — URINALYSIS, ROUTINE W REFLEX MICROSCOPIC
Bilirubin Urine: NEGATIVE
Glucose, UA: NEGATIVE mg/dL
Hgb urine dipstick: NEGATIVE
Ketones, ur: NEGATIVE mg/dL
Leukocytes,Ua: NEGATIVE
Nitrite: NEGATIVE
Protein, ur: NEGATIVE mg/dL
Specific Gravity, Urine: 1.014 (ref 1.005–1.030)
pH: 6 (ref 5.0–8.0)

## 2022-09-28 LAB — BASIC METABOLIC PANEL
Anion gap: 6 (ref 5–15)
BUN: 18 mg/dL (ref 8–23)
CO2: 22 mmol/L (ref 22–32)
Calcium: 9.1 mg/dL (ref 8.9–10.3)
Chloride: 110 mmol/L (ref 98–111)
Creatinine, Ser: 1.51 mg/dL — ABNORMAL HIGH (ref 0.61–1.24)
GFR, Estimated: 51 mL/min — ABNORMAL LOW (ref >60)
Glucose, Bld: 123 mg/dL — ABNORMAL HIGH (ref 70–99)
Potassium: 3.8 mmol/L (ref 3.5–5.1)
Sodium: 138 mmol/L (ref 135–145)

## 2022-09-28 MED ORDER — CIPROFLOXACIN HCL 500 MG PO TABS: 500.0000 mg | ORAL_TABLET | Freq: Two times a day (BID) | ORAL | 0 refills | Status: DC

## 2022-09-28 MED ORDER — OXYCODONE-ACETAMINOPHEN 5-325 MG PO TABS
1.0000 | ORAL_TABLET | Freq: Once | ORAL | Status: AC
  Administered 2022-09-28: 1 via ORAL
  Filled 2022-09-28: qty 1

## 2022-09-28 MED ORDER — OXYCODONE-ACETAMINOPHEN 5-325 MG PO TABS
1.0000 | ORAL_TABLET | Freq: Two times a day (BID) | ORAL | 0 refills | Status: DC | PRN
Start: 2022-09-28 — End: 2022-10-19

## 2022-09-28 NOTE — Telephone Encounter (Signed)
See routing comment(s) for message information.

## 2022-09-28 NOTE — Discharge Instructions (Addendum)
You were seen in the emergency room for scrotal pain.  The right side of the testicle definitely has something called hydrocele.  Read the instructions provided.  At this time, we do not think infection is the primary cause.  Therefore, take the pain medicine as prescribed.  Make sure that you are wearing supportive undergarments.  Start taking antibiotics only if you start having increasing pain, fevers, chills.  Call the urologist to set up an appointment.

## 2022-09-28 NOTE — ED Provider Notes (Signed)
[place] EMERGENCY DEPARTMENT AT [facility] Provider Note   CSN: [id number] Arrival date & time: 09/28/22  1140     History  Chief Complaint  Patient presents with   Testicle Pain    Curtis Saunders is a 67 y.o. male.  HPI    67 year old male comes in with chief complaint of right testicle pain. Patient history of BPH, hypertension, ascending aortic aneurysm, stroke and previous history of scrotal pain and swelling.  Patient indicates that last night he started having scrotal pain on the right side.  The pain is constant.  He denies any burning with urination, urinary frequency.  He has had about 2 or 3 episodes of similar symptoms in the last couple of years, patient informs me that typically he gets antibiotics and gets better.  Patient denies trauma.  Wife indicates that patient usually does not wear any underwear at home.  She is questioning if that could be contributing to his symptoms.  She wants to figure out what is driving his symptoms.  They have not followed up with urologist in the past.  Home Medications Prior to Admission medications   Medication Sig Start Date End Date Taking? Authorizing Provider  ciprofloxacin (CIPRO) 500 MG tablet Take 1 tablet (500 mg total) by mouth every 12 (twelve) hours. 09/28/22  Yes Derwood Kaplan, MD  oxyCODONE-acetaminophen (PERCOCET/ROXICET) 5-325 MG tablet Take 1 tablet by mouth every 12 (twelve) hours as needed for severe pain. 09/28/22  Yes Derwood Kaplan, MD  Accu-Chek FastClix Lancets MISC Apply topically. 03/07/22   [provider]  acetaminophen (TYLENOL) 325 MG tablet Take 2 tablets (650 mg total) by mouth every 6 (six) hours as needed for mild pain (or Fever >/= 101). Patient not taking: Reported on 08/08/2022 03/16/22   Alwyn Ren, MD  aspirin EC 81 MG EC tablet Take 1 tablet (81 mg total) by mouth daily. Swallow whole. 02/28/20   Dorcas Carrow, MD  blood glucose meter kit and supplies Dispense  based on patient and insurance preference. Use up to four times daily as directed. (FOR ICD-10 E10.9, E11.9). 03/02/22   Rai, Ripudeep K, MD  Blood Glucose Monitoring Suppl (ACCU-CHEK AVIVA PLUS) w/Device KIT 1 each by Does not apply route 4 (four) times daily -  before meals and at bedtime. 03/07/22   Rema Fendt, NP  glucose blood (ACCU-CHEK GUIDE) test strip Use as instructed 03/07/22   Rema Fendt, NP  insulin detemir (LEVEMIR) 100 UNIT/ML FlexPen Inject 14 Units into the skin daily. 06/16/22   Shamleffer, Konrad Dolores, MD  Insulin Pen Needle (PEN NEEDLES 3/16") 31G X 5 MM MISC 20 Units by Does not apply route 2 (two) times daily. 06/16/22   Shamleffer, Konrad Dolores, MD  Lactulose 20 GM/30ML SOLN Take 15 mLs (10 g total) by mouth every 12 (twelve) hours as needed. Patient not taking: Reported on 08/08/2022 03/16/22   Alwyn Ren, MD  Lancets Misc. (ACCU-CHEK FASTCLIX LANCET) KIT 1 each by Does not apply route 4 (four) times daily -  before meals and at bedtime. 03/07/22   Rema Fendt, NP  ondansetron (ZOFRAN) 4 MG tablet Take 1 tablet (4 mg total) by mouth every 6 (six) hours as needed for nausea. Patient not taking: Reported on 08/08/2022 03/16/22   Alwyn Ren, MD  predniSONE (DELTASONE) 5 MG tablet Take 1 tablet (5 mg total) by mouth daily with breakfast. Patient not taking: Reported on 08/08/2022 07/07/22   Shamleffer, Konrad Dolores,  MD  ramipril (ALTACE) 10 MG capsule Take 10 mg by mouth daily. 05/26/22   [provider]  rosuvastatin (CRESTOR) 20 MG tablet Take 1 tablet (20 mg total) by mouth daily. 08/08/22 11/06/22  Rema Fendt, NP  Semaglutide,0.25 or 0.5MG /DOS, 2 MG/3ML SOPN Inject 0.5 mg into the skin once a week. 06/16/22   Shamleffer, Konrad Dolores, MD  senna (SENOKOT) 8.6 MG TABS tablet Take 1 tablet (8.6 mg total) by mouth 2 (two) times daily. Patient not taking: Reported on 08/08/2022 03/16/22   Alwyn Ren, MD  tamsulosin [facility]) 0.4  MG CAPS capsule Take 1 capsule (0.4 mg total) by mouth daily. 03/02/22   Cathren Harsh, MD      Allergies    Bee venom    Review of Systems   Review of Systems  All other systems reviewed and are negative.   Physical Exam Updated Vital Signs BP (!) 153/95   Pulse 88   Temp 98.4 F (36.9 C) (Oral)   Resp 18   Ht 6' (1.829 m)   Wt 91.2 kg   SpO2 100%   BMI 27.26 kg/m  Physical Exam Vitals and nursing note reviewed.  Constitutional:      Appearance: He is well-developed.  HENT:     Head: Atraumatic.  Cardiovascular:     Rate and Rhythm: Normal rate.  Pulmonary:     Effort: Pulmonary effort is normal.  Genitourinary:    Comments: Patient has enlarged right scrotum with evidence of fullness and discomfort with palpation. No evidence of hernia Musculoskeletal:     Cervical back: Neck supple.  Skin:    General: Skin is warm.  Neurological:     Mental Status: He is alert and oriented to person, place, and time.     ED Results / Procedures / Treatments   Labs (all labs ordered are listed, but only abnormal results are displayed) Labs Reviewed  CBC WITH DIFFERENTIAL/PLATELET - Abnormal; Notable for the following components:      Result Value   WBC 10.6 (*)    Hemoglobin 12.7 (*)    All other components within normal limits  BASIC METABOLIC PANEL - Abnormal; Notable for the following components:   Glucose, Bld 123 (*)    Creatinine, Ser 1.51 (*)    GFR, Estimated 51 (*)    All other components within normal limits  URINE CULTURE  URINALYSIS, ROUTINE W REFLEX MICROSCOPIC    EKG None  Radiology US SCROTUM W/DOPPLER  Result Date: 09/28/2022 CLINICAL DATA:  67 year old with chronic/recurrent right scrotal swelling. EXAM: SCROTAL ULTRASOUND DOPPLER ULTRASOUND OF THE TESTICLES TECHNIQUE: Complete ultrasound examination of the testicles, epididymis, and other scrotal structures was performed. Color and spectral Doppler ultrasound were also utilized to evaluate blood  flow to the testicles. COMPARISON:  Previous scrotal ultrasound 04/24/2022 and 09/04/2020. Pelvic CT 04/08/2022. FINDINGS: Right testicle Measurements: 2.7 x 2.1 x 2.6 cm. No mass or microlithiasis visualized. Internal blood flow with color Doppler. Left testicle Measurements: 3.2 x 1.9 x 2.5 cm. As before, the left testis is diffusely heterogeneous in echotexture. No focal mass lesion identified. Blood flow is demonstrated on color Doppler. Right epididymis:  Normal in size and appearance. Left epididymis:  Normal in size and appearance. Hydrocele: Large right-sided hydrocele, as demonstrated previously. Probable small left-sided hydrocele. Varicocele:  None visualized. Pulsed Doppler interrogation of both testes demonstrates normal low resistance arterial and venous waveforms bilaterally. IMPRESSION: 1. Large right-sided hydrocele and small left-sided hydrocele, similar to previous studies.  2. Stable chronic heterogeneous appearance of the left testis without a focal mass lesion, likely related to remote infection, trauma or torsion. 3. The right testis appears mildly atrophied compared with the most recent prior study, but demonstrates no focal abnormality. 4. Both testes demonstrate normal blood flow with color Doppler. Electronically Signed   By: Carey Bullocks M.D.   On: 09/28/2022 14:52    Procedures Procedures    Medications Ordered in ED Medications  oxyCODONE-acetaminophen (PERCOCET/ROXICET) 5-325 MG per tablet 1 tablet (1 tablet Oral Given 09/28/22 1227)    ED Course/ Medical Decision Making/ A&P                                 Medical Decision Making Amount and/or Complexity of Data Reviewed Labs: ordered. Radiology: ordered.  Risk Prescription drug management.   67 year old with history of diabetes, CKD, aortic aneurysm and BPH comes in with chief complaint of scrotal pain.  I reviewed patient's records including previous ED visits and ultrasound from the last 2 years.  Patient  was found to have heterogenous, atrophic left testicle and small hydroceles in the past.  No known epididymitis.  On 1 of those instances, he had questionable urinary infection and started on Cipro.  Differential diagnosis for this patient includes epididymitis, testicular tumor, hydrocele, cystocele, scrotal abscess.  Less likely to be torsion.  Clinically patient is nontoxic.  Plan is to get ultrasound and reassess.  Reassessment: Results of the ED workup discussed with the patient.  UA independently interpreted, no evidence of infection.  Labs are reassuring.  Patient's ultrasound results are also indicative of hydrocele.  Patient made aware of this findings. He indicates that in the past he gets better with antibiotics, requesting it. I informed patient that although infection could cause hydrocele, it does not appear clinically that it is the case for him.  However, we will give him antibiotics for wait and watch approach.  If he starts worsening over the next 24 hours then he can start taking it.  Final Clinical Impression(s) / ED Diagnoses Final diagnoses:  Hydrocele, unspecified hydrocele type    Rx / DC Orders ED Discharge Orders          Ordered    ciprofloxacin (CIPRO) 500 MG tablet  Every 12 hours        09/28/22 1526    oxyCODONE-acetaminophen (PERCOCET/ROXICET) 5-325 MG tablet  Every 12 hours PRN        09/28/22 1527              Derwood Kaplan, MD 09/28/22 1535

## 2022-09-28 NOTE — ED Triage Notes (Addendum)
Patient said he feels that his right testicle is twisted and feels swollen. Pain began last night. Denies burning urination.

## 2022-09-29 LAB — URINE CULTURE

## 2022-10-06 ENCOUNTER — Ambulatory Visit: Payer: Self-pay | Admitting: *Deleted

## 2022-10-06 NOTE — Telephone Encounter (Signed)
  Chief Complaint: right shoulder pain and medication questions regarding insulin  Symptoms: right shoulder pain persists. Can use arm but pain level at 8 reports unable to take pain medication.  Frequency: 1 month Pertinent Negatives: Patient denies N/T right arm no swelling no neck pain no headaches.  Disposition: [] ED /[] Urgent Care (no appt availability in office) / [] Appointment(In office/virtual)/ []  [place] [facility]/ [] Home Care/ [] Refused Recommended Disposition /[] [facility]/ [x]  Follow-up with PCP Additional Notes:   Requesting appt none available until after Oct. Patient on wait list for appt.  Requesting if "shot" can be given for shoulder pain. Requesting if insulin (levimir) 14 units daily can be stopped due to taking Ozempic weekly and blood sugars running 93, 120 , WNL. Please advise . Recommended if pain worsen go to UC /ED.   Summary: right shoulder pain   Pt is calling in regarding right shoulder pain and wanting to be taken off his insulin.         Reason for Disposition  Shoulder pain is a chronic symptom (recurrent or ongoing AND present > 4 weeks)  Answer Assessment - Initial Assessment Questions 1. ONSET: "When did the pain start?"     1 month ago  2. LOCATION: "Where is the pain located?"     Right shoulder 3. PAIN: "How bad is the pain?" (Scale 1-10; or mild, moderate, severe)   - MILD (1-3): doesn't interfere with normal activities   - MODERATE (4-7): interferes with normal activities (e.g., work or school) or awakens from sleep   - SEVERE (8-10): excruciating pain, unable to do any normal activities, unable to move arm at all due to pain     Pain level 8  4. WORK OR EXERCISE: "Has there been any recent work or exercise that involved this part of the body?"     No  5. CAUSE: "What do you think is causing the shoulder pain?"     On going issue  6. OTHER SYMPTOMS: "Do you have any other symptoms?" (e.g., neck pain, swelling, rash, fever,  numbness, weakness)     Denies.  7. PREGNANCY: "Is there any chance you are pregnant?" "When was your last menstrual period?"     Na  Protocols used: Shoulder Pain-A-AH

## 2022-10-17 ENCOUNTER — Ambulatory Visit: Payer: Self-pay | Admitting: *Deleted

## 2022-10-17 NOTE — Telephone Encounter (Signed)
Summary: right shoulder pain   Patient is experiencing right shoulder pain and would like to have medication. He can not wait until the first available appt in Oct. Please f/u with patient       Chief Complaint: Right shoulder pain Symptoms: Right shoulder pain x 1 month., 9/10. "My rotator cuff, I need another injection." Frequency: 1 month Pertinent Negatives: Patient denies Disposition: [] ED /[] Urgent Care (no appt availability in office) / [x] Appointment(In office/virtual)/ []  [place] [facility]/ [] Home Care/ [] Refused Recommended Disposition /[] [place]/ []  Follow-up with PCP Additional Notes:  Pt triaged 10/06/22 for issue. Sent to practice with request for earlier appt. Secured appt for Thursday with Dr. Andrey Campanile.   Reason for Disposition  [1] Unable to use arm at all AND [2] because of shoulder pain or stiffness    9/10 pain with ROM  Answer Assessment - Initial Assessment Questions 1. ONSET: "When did the pain start?"     1 month ago 2. LOCATION: "Where is the pain located?"     Right shoulder 3. PAIN: "How bad is the pain?" (Scale 1-10; or mild, moderate, severe)   - MILD (1-3): doesn't interfere with normal activities   - MODERATE (4-7): interferes with normal activities (e.g., work or school) or awakens from sleep   - SEVERE (8-10): excruciating pain, unable to do any normal activities, unable to move arm at all due to pain     9/10 4. WORK OR EXERCISE: "Has there been any recent work or exercise that involved this part of the body?"     NO 5. CAUSE: "What do you think is causing the shoulder pain?"     Rotator cuff 6. OTHER SYMPTOMS: "Do you have any other symptoms?" (e.g., neck pain, swelling, rash, fever, numbness, weakness)     No  Protocols used: Shoulder Pain-A-AH

## 2022-10-18 NOTE — Telephone Encounter (Signed)
Report to Urgent Care/Emergency Department/call 911 for immediate medical evaluation. Schedule follow-up appointment with Primary Care at discharge.

## 2022-10-19 ENCOUNTER — Encounter: Payer: Self-pay | Admitting: Family

## 2022-10-19 ENCOUNTER — Ambulatory Visit (INDEPENDENT_AMBULATORY_CARE_PROVIDER_SITE_OTHER): Payer: Medicaid Other | Admitting: Family

## 2022-10-19 VITALS — BP 138/93 | HR 129 | Temp 98.5°F | Ht 72.0 in | Wt 200.8 lb

## 2022-10-19 DIAGNOSIS — M25511 Pain in right shoulder: Secondary | ICD-10-CM

## 2022-10-19 MED ORDER — TRIAMCINOLONE ACETONIDE 40 MG/ML IJ SUSP
40.0000 mg | Freq: Once | INTRAMUSCULAR | Status: AC
Start: 2022-10-19 — End: 2022-10-19
  Administered 2022-10-19: 40 mg via INTRAMUSCULAR

## 2022-10-19 NOTE — Progress Notes (Signed)
Patient ID: Curtis Saunders, male    DOB: 23-Jun-1955  MRN: [id number]  CC: Shoulder Pain  Subjective: Curtis Saunders is a 67 y.o. male who presents for shoulder pain.   His concerns today include:  Right shoulder pain for several weeks. Denies recent trauma/injury and red flag symptoms.    Patient Active Problem List   Diagnosis Date Noted   Diabetes mellitus (HCC) 06/16/2022   Low serum cortisol level 06/16/2022   Benign essential HTN 03/11/2022   Uncontrolled type 2 diabetes mellitus with hyperglycemia, with long-term current use of insulin (HCC) 03/11/2022   Acute urinary retention 03/11/2022   Obesity (BMI 30-39.9) 03/11/2022   Acute retention of urine 03/11/2022   Acute kidney injury superimposed on chronic kidney disease (HCC) 03/01/2022   New onset type 2 diabetes mellitus (HCC) 02/28/2022   BPH (benign prostatic hyperplasia) 02/28/2022   Pain in joint of right shoulder 01/09/2022   Partial thickness rotator cuff tear 01/09/2022   Osteoarthritis of right knee 11/08/2021   Pain in joint of right knee 11/08/2021   Low back pain 07/14/2021   Pain in joint of left shoulder 08/12/2020   Stage 3 chronic kidney disease (HCC) 04/12/2020   CVA (cerebral vascular accident) (HCC) 02/27/2020   Ascending aortic aneurysm (HCC) 02/11/2020   Double pterygium, 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     aspirin EC 81 MG EC tablet Take 1 tablet (81 mg total) by mouth daily. Swallow whole. 30 tablet 11   blood glucose meter kit and supplies Dispense based on patient and insurance preference. Use up to four times daily as directed. (FOR ICD-10 E10.9, E11.9). 1 each 0   Blood Glucose Monitoring Suppl (ACCU-CHEK  AVIVA PLUS) w/Device KIT 1 each by Does not apply route 4 (four) times daily -  before meals and at bedtime. 1 kit 0   glucose blood (ACCU-CHEK GUIDE) test strip Use as instructed 100 each 2   insulin detemir (LEVEMIR) 100 UNIT/ML FlexPen Inject 14 Units into the skin daily. 15 mL 6   Insulin Pen Needle (PEN NEEDLES 3/16") 31G X 5 MM MISC 20 Units by Does not apply route 2 (two) times daily. 100 each 3   Lancets Misc. (ACCU-CHEK FASTCLIX LANCET) KIT 1 each by Does not apply route 4 (four) times daily -  before meals and at bedtime. 1 kit 2   ramipril (ALTACE) 10 MG capsule Take 10 mg by mouth daily.     rosuvastatin (CRESTOR) 20 MG tablet Take 1 tablet (20 mg total) by mouth daily. 90 tablet 0   Semaglutide,0.25 or 0.5MG /DOS, 2 MG/3ML SOPN Inject 0.5 mg into the skin once a week. 9 mL 2   tamsulosin (FLOMAX) 0.4 MG CAPS capsule Take 1 capsule (0.4 mg total) by mouth daily. 30 capsule 3   No current facility-administered medications on file prior to visit.    Allergies  Allergen Reactions   Bee Venom Anaphylaxis    Social History   Socioeconomic History   Marital status: Single    Spouse name: Not on file   Number of children: Not on file   Years of education: Not on file   Highest education level: Not on file  Occupational History   Occupation:  retired  Tobacco Use   Smoking status: Never    Passive exposure: Never   Smokeless tobacco: Never  Vaping Use   Vaping status: Never Used  Substance and Sexual Activity   Alcohol use: Yes    Comment: occasional   Drug use: No   Sexual activity: Yes  Other Topics Concern   Not on file  Social History Narrative   ** Merged History Encounter **       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     Social Determinants of Health   Financial Resource Strain: Not on file  Food Insecurity: No Food Insecurity (03/01/2022)   Hunger Vital Sign    Worried About Running Out of Food in the Last Year: Never true    Ran Out of Food in the Last  Year: Never true  Transportation Needs: No Transportation Needs (03/01/2022)   PRAPARE - Administrator, Civil Service (Medical): No    Lack of Transportation (Non-Medical): No  Physical Activity: Not on file  Stress: Not on file  Social Connections: Not on file  Intimate Partner Violence: Not At Risk (03/01/2022)   Humiliation, Afraid, Rape, and Kick questionnaire    Fear of Current or Ex-Partner: No    Emotionally Abused: No    Physically Abused: No    Sexually Abused: No    Family History  Problem Relation Age of Onset   Heart attack Mother 30   COPD Mother    Emphysema Mother    Diabetes Father    Diabetes Sister    Hypertension Sister    Sleep apnea Neg Hx     Past Surgical History:  Procedure Laterality Date   APPENDECTOMY     EYE SURGERY     HAND SURGERY     KNEE SURGERY     x3, x1 R   LIPOMA EXCISION Left 10/13/2019   Procedure: SHOULDER EXCISION LIPOMA AND ROTATOR CUFF REPAIR;  Surgeon: Jones Broom, MD;  Location: [facility];  Service: Orthopedics;  Laterality: Left;   TONSILLECTOMY      ROS: Review of Systems Negative except as stated above  PHYSICAL EXAM: BP (!) 138/93   Pulse (!) 129   Temp 98.5 F (36.9 C) (Oral)   Ht 6' (1.829 m)   Wt 200 lb 12.8 oz (91.1 kg)   SpO2 96%   BMI 27.23 kg/m   Physical Exam HENT:     Head: Normocephalic and atraumatic.     Nose: Nose normal.     Mouth/Throat:     Mouth: Mucous membranes are moist.     Pharynx: Oropharynx is clear.  Eyes:     Extraocular Movements: Extraocular movements intact.     Conjunctiva/sclera: Conjunctivae normal.     Pupils: Pupils are equal, round, and reactive to light.  Cardiovascular:     Rate and Rhythm: Tachycardia present.     Pulses: Normal pulses.     Heart sounds: Normal heart sounds.  Pulmonary:     Effort: Pulmonary effort is normal.     Breath sounds: Normal breath sounds.  Musculoskeletal:     Right shoulder: Tenderness present. Decreased  range of motion.     Left shoulder: Normal.     Right upper arm: Normal.     Left upper arm: Normal.     Right elbow: Normal.     Left elbow: Normal.     Right forearm: Normal.     Left forearm: Normal.  Right wrist: Normal.     Left wrist: Normal.     Right hand: Normal.     Left hand: Normal.     Cervical back: Normal, normal range of motion and neck supple.     Thoracic back: Normal.     Lumbar back: Normal.     Right hip: Normal.     Left hip: Normal.     Right upper leg: Normal.     Left upper leg: Normal.     Right knee: Normal.     Left knee: Normal.     Right lower leg: Normal.     Left lower leg: Normal.     Right ankle: Normal.     Left ankle: Normal.     Right foot: Normal.     Left foot: Normal.  Neurological:     General: No focal deficit present.     Mental Status: He is alert and oriented to person, place, and time.  Psychiatric:        Mood and Affect: Mood normal.        Behavior: Behavior normal.     ASSESSMENT AND PLAN: 1. Right shoulder pain, unspecified chronicity - Triamcinolone Acetonide injection administered.  - Diagnostic xray right shoulder for evaluation.  - Referral to Orthopedic Surgery for evaluation/management. During the interim follow-up with primary provider as scheduled until established with referral. - Ambulatory referral to Orthopedic Surgery - triamcinolone acetonide (KENALOG-40) injection 40 mg - DG Shoulder Right; Future    Patient was given the opportunity to ask questions.  Patient verbalized understanding of the plan and was able to repeat key elements of the plan. Patient was given clear instructions to go to Emergency Department or return to medical center if symptoms don't improve, worsen, or new problems develop.The patient verbalized understanding.   Orders Placed This Encounter  Procedures   DG Shoulder Right   Ambulatory referral to Orthopedic Surgery    Follow-up with primary provider as scheduled.  Rema Fendt, NP

## 2022-10-19 NOTE — Progress Notes (Signed)
Right shoulder is hurting.   Patient wants to know if he can come off the insulin.

## 2022-10-31 ENCOUNTER — Ambulatory Visit: Payer: Medicaid Other | Admitting: Orthopaedic Surgery

## 2022-11-02 ENCOUNTER — Emergency Department (HOSPITAL_COMMUNITY)
Admission: EM | Admit: 2022-11-02 | Discharge: 2022-11-02 | Disposition: A | Discharge status: Home / Self Care | Payer: Medicaid Other

## 2022-11-02 ENCOUNTER — Encounter (HOSPITAL_COMMUNITY): Payer: Self-pay

## 2022-11-02 ENCOUNTER — Emergency Department (HOSPITAL_COMMUNITY): Payer: Medicaid Other

## 2022-11-02 ENCOUNTER — Other Ambulatory Visit: Payer: Self-pay

## 2022-11-02 DIAGNOSIS — N189 Chronic kidney disease, unspecified: Secondary | ICD-10-CM | POA: Insufficient documentation

## 2022-11-02 DIAGNOSIS — Z7982 Long term (current) use of aspirin: Secondary | ICD-10-CM | POA: Diagnosis not present

## 2022-11-02 DIAGNOSIS — N5089 Other specified disorders of the male genital organs: Secondary | ICD-10-CM | POA: Diagnosis present

## 2022-11-02 DIAGNOSIS — N433 Hydrocele, unspecified: Secondary | ICD-10-CM | POA: Diagnosis not present

## 2022-11-02 LAB — COMPREHENSIVE METABOLIC PANEL
ALT: 14 U/L (ref 0–44)
AST: 17 U/L (ref 15–41)
Albumin: 3.7 g/dL (ref 3.5–5.0)
Alkaline Phosphatase: 71 U/L (ref 38–126)
Anion gap: 6 (ref 5–15)
BUN: 20 mg/dL (ref 8–23)
CO2: 25 mmol/L (ref 22–32)
Calcium: 9.2 mg/dL (ref 8.9–10.3)
Chloride: 108 mmol/L (ref 98–111)
Creatinine, Ser: 1.68 mg/dL — ABNORMAL HIGH (ref 0.61–1.24)
GFR, Estimated: 45 mL/min — ABNORMAL LOW (ref >60)
Glucose, Bld: 137 mg/dL — ABNORMAL HIGH (ref 70–99)
Potassium: 3.6 mmol/L (ref 3.5–5.1)
Sodium: 139 mmol/L (ref 135–145)
Total Bilirubin: 0.6 mg/dL (ref 0.3–1.2)
Total Protein: 7 g/dL (ref 6.5–8.1)

## 2022-11-02 LAB — URINALYSIS, ROUTINE W REFLEX MICROSCOPIC
Bilirubin Urine: NEGATIVE
Glucose, UA: NEGATIVE mg/dL
Hgb urine dipstick: NEGATIVE
Ketones, ur: NEGATIVE mg/dL
Leukocytes,Ua: NEGATIVE
Nitrite: NEGATIVE
Protein, ur: NEGATIVE mg/dL
Specific Gravity, Urine: 1.021 (ref 1.005–1.030)
pH: 5 (ref 5.0–8.0)

## 2022-11-02 LAB — CBC WITH DIFFERENTIAL/PLATELET
Abs Immature Granulocytes: 0.05 10*3/uL (ref 0.00–0.07)
Basophils Absolute: 0 10*3/uL (ref 0.0–0.1)
Basophils Relative: 0 %
Eosinophils Absolute: 0.2 10*3/uL (ref 0.0–0.5)
Eosinophils Relative: 2 %
HCT: 41.1 % (ref 39.0–52.0)
Hemoglobin: 12.8 g/dL — ABNORMAL LOW (ref 13.0–17.0)
Immature Granulocytes: 1 %
Lymphocytes Relative: 18 %
Lymphs Abs: 1.9 10*3/uL (ref 0.7–4.0)
MCH: 28.6 pg (ref 26.0–34.0)
MCHC: 31.1 g/dL (ref 30.0–36.0)
MCV: 91.7 fL (ref 80.0–100.0)
Monocytes Absolute: 0.6 10*3/uL (ref 0.1–1.0)
Monocytes Relative: 6 %
Neutro Abs: 7.7 10*3/uL (ref 1.7–7.7)
Neutrophils Relative %: 73 %
Platelets: 181 10*3/uL (ref 150–400)
RBC: 4.48 MIL/uL (ref 4.22–5.81)
RDW: 14.7 % (ref 11.5–15.5)
WBC: 10.4 10*3/uL (ref 4.0–10.5)
nRBC: 0 % (ref 0.0–0.2)

## 2022-11-02 NOTE — ED Provider Triage Note (Signed)
Emergency Medicine Provider Triage Evaluation Note  Curtis Saunders , a 67 y.o. male  was evaluated in triage.  Pt complains of right testicular swelling and pain since Tuesday. Denies any trauma to the area. Denies any concern for STD. Denies any trouble moving his bowels or bladder.  Review of Systems  Positive:  Negative:   Physical Exam  BP (!) 139/93 (BP Location: Left Arm)   Pulse 72   Temp 98 F (36.7 C) (Oral)   Resp 18   Ht 6' (1.829 m)   Wt 89.4 kg   SpO2 98%   BMI 26.72 kg/m  Gen:   Awake, no distress   Resp:  Normal effort  MSK:   Moves extremities without difficulty  Other:  Right testicle significantly swollen. Tender. Left testicle palpated. I do not appreciate a hernia out of the canal  Medical Decision Making  Medically screening exam initiated at 9:47 AM.  Appropriate orders placed.  Curtis Saunders was informed that the remainder of the evaluation will be completed by another provider, this initial triage assessment does not replace that evaluation, and the importance of remaining in the ED until their evaluation is complete.  Likely testicular in etiology for swelling. If [place] was negative, would consider CT to r/o hernia. Labs ordered as well. Patient does not appear in any acute distress.    Achille Rich, [place] 11/02/22 [phone]

## 2022-11-02 NOTE — ED Triage Notes (Addendum)
Pt complaining of right testicular swelling and pain that began Tuesday. Pt has tried taking abx, and ice without relief.

## 2022-11-02 NOTE — ED Provider Notes (Signed)
[facility] EMERGENCY DEPARTMENT AT [facility] Provider Note   CSN: [id number] Arrival date & time: 11/02/22  1478     History  Chief Complaint  Patient presents with   Testicle Pain    Curtis Saunders is a 67 y.o. male.  67 year old male presenting emergency department with swelling and pain to his right testicle since Tuesday.  He has had swelling in the past.  Tingling better with antibiotics.  No dysuria or frequency.  Pain worsened by touch.  No fevers or chills.  No abdominal pain nausea vomiting.  No recent unprotected intercourse or concerns for STI.  No trauma.   Testicle Pain       Home Medications Prior to Admission medications   Medication Sig Start Date End Date Taking? Authorizing Provider  Accu-Chek FastClix Lancets MISC Apply topically. 03/07/22  Yes [provider]  aspirin EC 81 MG EC tablet Take 1 tablet (81 mg total) by mouth daily. Swallow whole. 02/28/20  Yes Dorcas Carrow, MD  blood glucose meter kit and supplies Dispense based on patient and insurance preference. Use up to four times daily as directed. (FOR ICD-10 E10.9, E11.9). 03/02/22  Yes Rai, Ripudeep K, MD  Blood Glucose Monitoring Suppl (ACCU-CHEK AVIVA PLUS) w/Device KIT 1 each by Does not apply route 4 (four) times daily -  before meals and at bedtime. 03/07/22  Yes Zonia Kief, Amy J, NP  glucose blood (ACCU-CHEK GUIDE) test strip Use as instructed 03/07/22  Yes Zonia Kief, Amy J, NP  insulin detemir (LEVEMIR) 100 UNIT/ML FlexPen Inject 14 Units into the skin daily. 06/16/22  Yes Shamleffer, Konrad Dolores, MD  Insulin Pen Needle (PEN NEEDLES 3/16") 31G X 5 MM MISC 20 Units by Does not apply route 2 (two) times daily. 06/16/22  Yes Shamleffer, Konrad Dolores, MD  Lancets Misc. (ACCU-CHEK FASTCLIX LANCET) KIT 1 each by Does not apply route 4 (four) times daily -  before meals and at bedtime. 03/07/22  Yes Zonia Kief, Amy J, NP  ramipril (ALTACE) 10 MG capsule Take 10 mg by mouth  daily. 05/26/22  Yes [provider]  rosuvastatin (CRESTOR) 20 MG tablet Take 1 tablet (20 mg total) by mouth daily. 08/08/22 11/06/22 Yes Zonia Kief, Amy J, NP  Semaglutide,0.25 or 0.5MG /DOS, 2 MG/3ML SOPN Inject 0.5 mg into the skin once a week. 06/16/22  Yes Shamleffer, Konrad Dolores, MD  tamsulosin (FLOMAX) 0.4 MG CAPS capsule Take 1 capsule (0.4 mg total) by mouth daily. 03/02/22  Yes Rai, Delene Ruffini, MD      Allergies    Bee venom    Review of Systems   Review of Systems  Genitourinary:  Positive for testicular pain.    Physical Exam Updated Vital Signs BP (!) 159/93   Pulse 88   Temp (!) 97.5 F (36.4 C)   Resp 18   Ht 6' (1.829 m)   Wt 89.4 kg   SpO2 99%   BMI 26.72 kg/m  Physical Exam Vitals and nursing note reviewed.  Constitutional:      General: He is not in acute distress.    Appearance: He is not toxic-appearing.  HENT:     Head: Normocephalic.     Nose: Nose normal.     Mouth/Throat:     Mouth: Mucous membranes are moist.  Eyes:     Conjunctiva/sclera: Conjunctivae normal.  Cardiovascular:     Rate and Rhythm: Normal rate and regular rhythm.  Pulmonary:     Effort: Pulmonary effort is normal.  Abdominal:  General: Abdomen is flat. There is no distension.     Palpations: Abdomen is soft.     Tenderness: There is no abdominal tenderness. There is no rebound.  Genitourinary:    Penis: Normal.      Comments: Enlarged right testicle with some minor tenderness.  Left testicle within normal limits.  No erythema or warmth to the perineum or scrotum.  Cremasteric reflex intact bilaterally. Skin:    General: Skin is warm and dry.     Capillary Refill: Capillary refill takes less than 2 seconds.  Neurological:     Mental Status: He is alert and oriented to person, place, and time.  Psychiatric:        Mood and Affect: Mood normal.        Behavior: Behavior normal.     ED Results / Procedures / Treatments   Labs (all labs ordered are listed, but  only abnormal results are displayed) Labs Reviewed  CBC WITH DIFFERENTIAL/PLATELET - Abnormal; Notable for the following components:      Result Value   Hemoglobin 12.8 (*)    All other components within normal limits  COMPREHENSIVE METABOLIC PANEL - Abnormal; Notable for the following components:   Glucose, Bld 137 (*)    Creatinine, Ser 1.68 (*)    GFR, Estimated 45 (*)    All other components within normal limits  URINALYSIS, ROUTINE W REFLEX MICROSCOPIC  GC/CHLAMYDIA PROBE AMP ([place]) NOT AT [facility]    EKG None  Radiology US SCROTUM W/DOPPLER  Result Date: 11/02/2022 CLINICAL DATA:  Right testicular swelling. EXAM: SCROTAL ULTRASOUND DOPPLER ULTRASOUND OF THE TESTICLES TECHNIQUE: Complete ultrasound examination of the testicles, epididymis, and other scrotal structures was performed. Color and spectral Doppler ultrasound were also utilized to evaluate blood flow to the testicles. COMPARISON:  September 28, 2022. FINDINGS: Right testicle Measurements: 3.7 x 2.3 x 2.1 cm. No mass or microlithiasis visualized. Left testicle Measurements: 3.8 x 2.4 x 1.8 cm. Heterogeneous appearance of the left testicle is again noted. There is now noted ill-defined hypoechoic area seen posteriorly measuring 11 x 8 mm. Right epididymis:  3 mm cyst is noted. Left epididymis:  Normal in size and appearance. Hydrocele:  Large right hydrocele is again noted. Varicocele:  None visualized. Pulsed Doppler interrogation of both testes demonstrates normal low resistance arterial and venous waveforms bilaterally. IMPRESSION: No evidence of testicular torsion. Heterogeneous echotexture of left testicle is again noted. However, there is now noted focal ill-defined hypoechoic area posteriorly measuring 11 x 8 mm. It is uncertain if this represents inflammation or possibly neoplasm. Consultation with urology and short-term follow-up ultrasound may be considered. Large right hydrocele is again noted. Electronically Signed    By: Lupita Raider M.D.   On: 11/02/2022 12:01    Procedures Procedures    Medications Ordered in ED Medications - No data to display  ED Course/ Medical Decision Making/ A&P Clinical Course as of 11/02/22 1736  Thu Nov 02, 2022  1213 US SCROTUM W/DOPPLER IMPRESSION: No evidence of testicular torsion.  Heterogeneous echotexture of left testicle is again noted. However, there is now noted focal ill-defined hypoechoic area posteriorly measuring 11 x 8 mm. It is uncertain if this represents inflammation or possibly neoplasm. Consultation with urology and short-term follow-up ultrasound may be considered.  Large right hydrocele is again noted.   [TY]  1300 Urology consulted; looking at images and will give further recommendations. [TY]  1353 Evaluated by urology.  Do not feel that antibiotics are indicated  for epididymitis/orchitis.  No emergent indication for procedure.  They will reach out to scheduling and get him closer follow-up.  Recommending supportive care Tylenol Motrin. [TY]    Clinical Course User Index [TY] Coral Spikes, DO                                 Medical Decision Making Well-appearing 66 year old male present emergency department with pain and swelling in his right testicle.  Is afebrile vital signs reassuring.  Per chart review appears he has had similar episode this this past month.  Was treated with Cipro even though his workup was seemingly negative.  Ultrasound 1 month ago with right sided hydrocele.  Reviewed labs from triage.  UA without evidence of UTI.  Stable CKD.  No leukocytosis to suggest systemic infection.  GC chlamydia pending.  No empiric treatment.  Ultrasound ordered by triage provider with no torsion, but did show small mass.  Urology consulted.  See ED course.  Patient stable for discharge with close urology follow-up.  Return precautions given.  Amount and/or Complexity of Data Reviewed Radiology:  Decision-making details documented in  ED Course.  Risk Diagnosis or treatment significantly limited by social determinants of health. Risk Details: Poor health literacy.           Final Clinical Impression(s) / ED Diagnoses Final diagnoses:  Hydrocele, unspecified hydrocele type    Rx / DC Orders ED Discharge Orders     None         Coral Spikes, DO 11/02/22 1736

## 2022-11-02 NOTE — Consult Note (Addendum)
Urology Consult Note   Requesting Attending Physician:  Coral Spikes, DO Service Providing Consult: Urology  Consulting Attending: Dr. Jennette Bill   Reason for Consult: Left-sided ill-defined intratesticular mass.  HPI: Curtis Saunders is seen in consultation for reasons noted above at the request of Coral Spikes, DO.  Patient is known to our practice and has seen several providers.  Primarily Dr. Cardell Peach.  He is struggled with BPH and lower urinary tract symptoms for some time.  He has undergone UDS and has a postvoid residual close to 400 mL.  Surgery for obstructing prostate has been recommended but patient has declined out of nervousness at this time.  He presented to the emergency department on 9/5 for testicular pain and was ultimately treated with Cipro which she said provided improvement in the discomfort and swelling.  He began to have discomfort in the right side testicle after completing that course and attempted to schedule with my practice.  He reports he was unable to get an appointment until November so he came to the emergency department this morning.  On my arrival patient was alert, oriented, no distress.  His pain was well-managed and he tolerated physical exam without difficulty ------------------  Assessment:  67 y.o. male with right-sided testicular pain   Recommendations: # Right-sided testicular pain No evidence of systemic infection or urinary tract infection.  Typically epididymoorchitis is exquisitely painful and patient tolerated a significant amount of palpation of the right hemiscrotum without any complaints.  I have a low suspicion that this is an infectious process.   # Left-sided testicular mass Ill-defined 11 x 8 mm mass within left testicle noted on scrotal ultrasound, 11/01/2020.  I am not sure what this mass represents, it is a relatively new finding.  I was not able to palpated and patient tolerated examination well.  He will need to follow-up  closely in clinic for repeat ultrasound.   No acute urologic issues identified at this time.  Case and plan discussed with Dr. Jennette Bill  Past Medical History: Past Medical History:  Diagnosis Date   Diabetes mellitus (HCC)    Hypertension    Stroke (HCC) 2023   x 2, no deficits    Past Surgical History:  Past Surgical History:  Procedure Laterality Date   APPENDECTOMY     EYE SURGERY     HAND SURGERY     KNEE SURGERY     x3, x1 R   LIPOMA EXCISION Left 10/13/2019   Procedure: SHOULDER EXCISION LIPOMA AND ROTATOR CUFF REPAIR;  Surgeon: Jones Broom, MD;  Location: [facility];  Service: Orthopedics;  Laterality: Left;   TONSILLECTOMY      Medication: No current facility-administered medications for this encounter.   Current Outpatient Medications  Medication Sig Dispense Refill   Accu-Chek FastClix Lancets MISC Apply topically.     aspirin EC 81 MG EC tablet Take 1 tablet (81 mg total) by mouth daily. Swallow whole. 30 tablet 11   blood glucose meter kit and supplies Dispense based on patient and insurance preference. Use up to four times daily as directed. (FOR ICD-10 E10.9, E11.9). 1 each 0   Blood Glucose Monitoring Suppl (ACCU-CHEK AVIVA PLUS) w/Device KIT 1 each by Does not apply route 4 (four) times daily -  before meals and at bedtime. 1 kit 0   glucose blood (ACCU-CHEK GUIDE) test strip Use as instructed 100 each 2   insulin detemir (LEVEMIR) 100 UNIT/ML FlexPen Inject 14 Units into the skin  daily. 15 mL 6   Insulin Pen Needle (PEN NEEDLES 3/16") 31G X 5 MM MISC 20 Units by Does not apply route 2 (two) times daily. 100 each 3   Lancets Misc. (ACCU-CHEK FASTCLIX LANCET) KIT 1 each by Does not apply route 4 (four) times daily -  before meals and at bedtime. 1 kit 2   ramipril (ALTACE) 10 MG capsule Take 10 mg by mouth daily.     rosuvastatin (CRESTOR) 20 MG tablet Take 1 tablet (20 mg total) by mouth daily. 90 tablet 0   Semaglutide,0.25 or 0.5MG /DOS,  2 MG/3ML SOPN Inject 0.5 mg into the skin once a week. 9 mL 2   tamsulosin (FLOMAX) 0.4 MG CAPS capsule Take 1 capsule (0.4 mg total) by mouth daily. 30 capsule 3    Allergies: Allergies  Allergen Reactions   Bee Venom Anaphylaxis    Social History: Social History   Tobacco Use   Smoking status: Never    Passive exposure: Never   Smokeless tobacco: Never  Vaping Use   Vaping status: Never Used  Substance Use Topics   Alcohol use: Yes    Comment: occasional   Drug use: No    Family History Family History  Problem Relation Age of Onset   Heart attack Mother 65   COPD Mother    Emphysema Mother    Diabetes Father    Diabetes Sister    Hypertension Sister    Sleep apnea Neg Hx     Review of Systems  Genitourinary:        Testicular pain, right side     Objective   Vital signs in last 24 hours: BP (!) 156/144   Pulse 66   Temp 97.8 F (36.6 C) (Oral)   Resp 20   Ht 6' (1.829 m)   Wt 89.4 kg   SpO2 95%   BMI 26.72 kg/m   Physical Exam General: NAD, A&O, resting, appropriate HEENT: [place]/AT Pulmonary: Normal work of breathing Cardiovascular: RRR, no cyanosis Abdomen: Soft, NTTP, nondistended GU: Established right side hydrocele noted.  Patient tolerated palpation without difficulty.  Left hemiscrotum unremarkable.   Most Recent Labs: Lab Results  Component Value Date   WBC 10.4 11/02/2022   HGB 12.8 (L) 11/02/2022   HCT 41.1 11/02/2022   PLT 181 11/02/2022    Lab Results  Component Value Date   NA 139 11/02/2022   K 3.6 11/02/2022   CL 108 11/02/2022   CO2 25 11/02/2022   BUN 20 11/02/2022   CREATININE 1.68 (H) 11/02/2022   CALCIUM 9.2 11/02/2022   MG 2.8 (H) 03/16/2022   PHOS 3.7 03/16/2022    Lab Results  Component Value Date   INR 1.0 08/18/2022   APTT 36 08/18/2022     Urine Culture: @[id number] (laburin,org,[id number])@   IMAGING: US SCROTUM W/DOPPLER  Result Date: 11/02/2022 CLINICAL DATA:  Right testicular swelling.  EXAM: SCROTAL ULTRASOUND DOPPLER ULTRASOUND OF THE TESTICLES TECHNIQUE: Complete ultrasound examination of the testicles, epididymis, and other scrotal structures was performed. Color and spectral Doppler ultrasound were also utilized to evaluate blood flow to the testicles. COMPARISON:  September 28, 2022. FINDINGS: Right testicle Measurements: 3.7 x 2.3 x 2.1 cm. No mass or microlithiasis visualized. Left testicle Measurements: 3.8 x 2.4 x 1.8 cm. Heterogeneous appearance of the left testicle is again noted. There is now noted ill-defined hypoechoic area seen posteriorly measuring 11 x 8 mm. Right epididymis:  3 mm cyst is noted. Left epididymis:  Normal in size and  appearance. Hydrocele:  Large right hydrocele is again noted. Varicocele:  None visualized. Pulsed Doppler interrogation of both testes demonstrates normal low resistance arterial and venous waveforms bilaterally. IMPRESSION: No evidence of testicular torsion. Heterogeneous echotexture of left testicle is again noted. However, there is now noted focal ill-defined hypoechoic area posteriorly measuring 11 x 8 mm. It is uncertain if this represents inflammation or possibly neoplasm. Consultation with urology and short-term follow-up ultrasound may be considered. Large right hydrocele is again noted. Electronically Signed   By: Lupita Raider M.D.   On: 11/02/2022 12:01    ------  Elmon Kirschner, NP Pager: [phone]   Please contact the urology consult pager with any further questions/concerns.   The patient was discussed with me and I agree with the assessment and plan. Patient likely has a symptomatic hydrocele.

## 2022-11-02 NOTE — Discharge Instructions (Signed)
Urology will be calling you to schedule a sooner appointment.  Please follow-up with them.  May take over-the-counter Tylenol alternating with Motrin for pain.  Return if develop fevers, chills, worsening pain, urinary symptoms or any new or worsening symptoms that are concerning to you.

## 2022-11-03 ENCOUNTER — Other Ambulatory Visit: Payer: Self-pay | Admitting: Family

## 2022-11-03 DIAGNOSIS — E785 Hyperlipidemia, unspecified: Secondary | ICD-10-CM

## 2022-11-03 LAB — GC/CHLAMYDIA PROBE AMP (~~LOC~~) NOT AT ARMC
Chlamydia: NEGATIVE
Comment: NEGATIVE
Comment: NORMAL
Neisseria Gonorrhea: NEGATIVE

## 2022-11-28 ENCOUNTER — Ambulatory Visit (INDEPENDENT_AMBULATORY_CARE_PROVIDER_SITE_OTHER): Payer: 59 | Admitting: Internal Medicine

## 2022-11-28 ENCOUNTER — Telehealth: Payer: Self-pay | Admitting: Family

## 2022-11-28 ENCOUNTER — Encounter: Payer: Self-pay | Admitting: Internal Medicine

## 2022-11-28 VITALS — BP 124/74 | HR 64 | Ht 72.0 in | Wt 199.6 lb

## 2022-11-28 DIAGNOSIS — E1159 Type 2 diabetes mellitus with other circulatory complications: Secondary | ICD-10-CM | POA: Diagnosis not present

## 2022-11-28 DIAGNOSIS — E2749 Other adrenocortical insufficiency: Secondary | ICD-10-CM | POA: Diagnosis not present

## 2022-11-28 DIAGNOSIS — N1832 Chronic kidney disease, stage 3b: Secondary | ICD-10-CM

## 2022-11-28 DIAGNOSIS — E1122 Type 2 diabetes mellitus with diabetic chronic kidney disease: Secondary | ICD-10-CM | POA: Diagnosis not present

## 2022-11-28 DIAGNOSIS — Z794 Long term (current) use of insulin: Secondary | ICD-10-CM | POA: Diagnosis not present

## 2022-11-28 LAB — POCT GLYCOSYLATED HEMOGLOBIN (HGB A1C): Hemoglobin A1C: 6.1 % — AB (ref 4.0–5.6)

## 2022-11-28 LAB — POCT GLUCOSE (DEVICE FOR HOME USE): POC Glucose: 114 mg/dL — AB (ref 70–99)

## 2022-11-28 MED ORDER — SEMAGLUTIDE(0.25 OR 0.5MG/DOS) 2 MG/3ML ~~LOC~~ SOPN: 0.5000 mg | PEN_INJECTOR | SUBCUTANEOUS | 2 refills | Status: DC

## 2022-11-28 MED ORDER — HYDROCORTISONE 10 MG PO TABS: ORAL_TABLET | ORAL | 3 refills | Status: DC

## 2022-11-28 NOTE — Patient Instructions (Addendum)
FOR DIABETES:  Stop Levemir  Continue Ozempic  0.5 mg weekly   FOR ADRENAL INSUFFICIENY:  Get a medical alert bracelet that says " adrenal insufficiency" Take Hydrocortisone 10 mg, 1.5 tablets with Breakfast and HALF a tablet in the evening    ADRENAL INSUFFICIENCY SICK DAY RULES:  Should you face an extreme emotional or physical stress such as trauma, surgery or acute illness, this will require extra steroid coverage so that the body can meet that stress.   Without increasing the steroid dose you may experience severe weakness, headache, dizziness, nausea and vomiting and possibly a more serious deterioration in health.  Typically the dose of steroids will only need to be increased for a couple of days if you have an illness that is transient and managed in the community.   If you are unable to take/absorb an increased dose of steroids orally because of vomiting or diarrhea, you will urgently require steroid injections and should present to an Emergency Department.  The general advice for any serious illness is as follows: Double the normal daily steroid dose for up to 3 days if you have a temperature of more than 37.50C (99.37F) with signs of sickness, or severe emotional or physical distress Contact your primary care doctor and Endocrinologist if the illness worsens or it lasts for more than 3 days.  In cases of severe illness, urgent medical assistance should be promptly sought. If you experience vomiting/diarrhea or are unable to take steroids by mouth, please administer the Hydrocortisone injection kit and seek urgent medical help.

## 2022-11-28 NOTE — Progress Notes (Signed)
Name: Curtis Saunders  MRN/ DOB: [id number], [birth year]   Age/ Sex: 67 y.o., male    PCP: Rema Fendt, NP   Reason for Endocrinology Evaluation: Type 2 Diabetes Mellitus     Date of Initial Endocrinology Visit: 06/16/2022    PATIENT IDENTIFIER: [name] is a 66 y.o. male with a past medical history of DM, HTN, CVA. The patient presented for initial endocrinology clinic visit on 06/16/2022 for consultative assistance with his diabetes management.    HPI: [name] was    Diagnosed with DM 06/2021, pt presented to the ED with severe hyperglycemia >600 mg/dL in 06/5782, he was discharged on insulin mix , he did have DKA at the time. Prior Medications tried/Intolerance: levemir           Hemoglobin A1c has ranged from 7.3% in 2023, peaking at 14.4% in 2024.  On his initial visit to our clinic he had an A1c of 6.9%, was on Levemir only, started Ozempic     Adrenal HISTORY:  Patient had an abnormal cosyntropin stimulation test 03/15/2022 with a baseline cortisol of 8.5 UG/DL, with a 69-GEXBMW cortisol at 17.1 UG/DL. He was hospitalized for constipation at the time  with decrease urine output, was noted with hypokalemia , serum cortisol was 2.8  Brain MRI and adrenal imagine negative    Repeat cosyntropin stimulation test was abnormal 07/05/2022 with a 60-minute cortisol of 12.9 UG/DL ACTH at the lower limit of normal of 6 PG/mL (reference 6-50)  Due to miscommunication, the patient did not start prednisone as initially advised in June, and was started on hydrocortisone 11/2022   SUBJECTIVE:   During the last visit (06/12/2022): A1c 6.9%  Today (11/28/22): Curtis Saunders is here for follow-up on diabetes management and adrenal insufficiency.  He checks his blood sugars occasionally  times daily. The patient has not had hypoglycemic episodes since the last clinic visit    Pt follows with cardiology for LVH , EF 65-70    Denies nausea or  vomiting  Denies constipation or diarrhea  Has extreme  fatigue  Today noted with dizziness     HOME ENDOCRINE REGIMEN: Ozempic 0.5 mg weekly Levemir 14 units daily Prednisone 5 mg daily - not taking     Statin: Yes ACE-I/ARB: yes   METER DOWNLOAD SUMMARYn/a   DIABETIC COMPLICATIONS: Microvascular complications:  CKD III Denies:  Last eye exam: Completed yrs ago   Macrovascular complications:  CVA Denies: CAD, PVD   PAST HISTORY: Past Medical History:  Past Medical History:  Diagnosis Date   Diabetes mellitus (HCC)    Hypertension    Stroke (HCC) 2023   x 2, no deficits   Past Surgical History:  Past Surgical History:  Procedure Laterality Date   APPENDECTOMY     EYE SURGERY     HAND SURGERY     KNEE SURGERY     x3, x1 R   LIPOMA EXCISION Left 10/13/2019   Procedure: SHOULDER EXCISION LIPOMA AND ROTATOR CUFF REPAIR;  Surgeon: Jones Broom, MD;  Location: [facility];  Service: Orthopedics;  Laterality: Left;   TONSILLECTOMY      Social History:  reports that he has never smoked. He has never been exposed to tobacco smoke. He has never used smokeless tobacco. He reports current alcohol use. He reports that he does not use drugs. Family History:  Family History  Problem Relation Age of Onset   Heart attack Mother 74   COPD Mother  Emphysema Mother    Diabetes Father    Diabetes Sister    Hypertension Sister    Sleep apnea Neg Hx      HOME MEDICATIONS: Allergies as of 11/28/2022       Reactions   Bee Venom Anaphylaxis        Medication List        Accurate as of November 28, 2022  1:51 PM. If you have any questions, ask your nurse or doctor.          STOP taking these medications    insulin detemir 100 UNIT/ML FlexPen Commonly known as: LEVEMIR Stopped by: Johnney Ou Aidyn Sportsman   Pen Needles 3/16" 31G X 5 MM Misc Stopped by: Johnney Ou Bethany Hirt       TAKE these medications    Accu-Chek Aviva Plus  w/Device Kit 1 each by Does not apply route 4 (four) times daily -  before meals and at bedtime.   Accu-Chek [employer] Kit 1 each by Does not apply route 4 (four) times daily -  before meals and at bedtime.   Accu-Chek FastClix Lancets Misc Apply topically.   Accu-Chek Guide test strip Generic drug: glucose blood Use as instructed   aspirin EC 81 MG tablet Take 1 tablet (81 mg total) by mouth daily. Swallow whole.   blood glucose meter kit and supplies Dispense based on patient and insurance preference. Use up to four times daily as directed. (FOR ICD-10 E10.9, E11.9).   finasteride 5 MG tablet Commonly known as: PROSCAR Take 5 mg by mouth daily.   hydrocortisone 10 MG tablet Commonly known as: CORTEF Take 1.5 tablets (15 mg total) by mouth daily with breakfast AND 0.5 tablets (5 mg total) daily with supper. Started by: Johnney Ou Jirah Rider   ramipril 10 MG capsule Commonly known as: ALTACE Take 10 mg by mouth daily.   rosuvastatin 20 MG tablet Commonly known as: CRESTOR Take 1 tablet by mouth once daily   Semaglutide(0.25 or 0.5MG /DOS) 2 MG/3ML Sopn Inject 0.5 mg into the skin once a week.   tamsulosin 0.4 MG Caps capsule Commonly known as: FLOMAX Take 1 capsule (0.4 mg total) by mouth daily.         ALLERGIES: Allergies  Allergen Reactions   Bee Venom Anaphylaxis     REVIEW OF SYSTEMS: A comprehensive ROS was conducted with the patient and is negative except as per HPI    OBJECTIVE:   VITAL SIGNS: BP 124/74 (BP Location: Left Arm, Patient Position: Sitting, Cuff Size: Large)   Pulse 64   Ht 6' (1.829 m)   Wt 199 lb 9.6 oz (90.5 kg)   SpO2 99%   BMI 27.07 kg/m      Body surface area is 2.14 meters squared.   PHYSICAL EXAM:  General: Pt appears well and is in NAD  Neck: General: Supple without adenopathy or carotid bruits. Thyroid: Thyroid size normal.  No goiter or nodules appreciated.   Lungs: Clear with good BS bilat   Heart: RRR    Extremities:  Lower extremities - No pretibial edema.   Neuro: MS is good with appropriate affect, pt is alert and Ox3    DM foot exam: 06/16/2022  The skin of the feet is without sores or ulcerations, with thickened and discolored nails  The pedal pulses are 2+ on right and 2+ on left. The sensation is intact to a screening 5.07, 10 gram monofilament bilaterally   DATA REVIEWED:  Lab Results  Component Value  Date   HGBA1C 6.1 (A) 11/28/2022   HGBA1C 6.9 (A) 06/16/2022   HGBA1C 14.4 (H) 02/28/2022   Lab Results  Component Value Date   MICROALBUR 10.4 (H) 07/05/2022   LDLCALC 49 03/11/2022   CREATININE 1.68 (H) 11/02/2022   Lab Results  Component Value Date   MICRALBCREAT 6.1 07/05/2022    Lab Results  Component Value Date   CHOL 111 03/11/2022   HDL 52 03/11/2022   LDLCALC 49 03/11/2022   TRIG 50 03/11/2022   CHOLHDL 2.1 03/11/2022        Old records , labs and images have been reviewed.    ASSESSMENT / PLAN / RECOMMENDATIONS:   1) Type 2 Diabetes Mellitus, Optimally controlled, With CKD III and macrovascular complications - Most recent A1c of 6.1 %. Goal A1c < 7.0 %.    -I have praised the patient improved glycemic control -He is tolerating Ozempic, but opted to remain on the same dose at this time - Patient with DKA 02/2022, NOT a candidate for SGLT2 inhibitors -Will discontinue insulin as below  MEDICATIONS: Continue Ozempic  0.5 mg weekly Stop Levemir 14 units daily  EDUCATION / INSTRUCTIONS: BG monitoring instructions: Patient is instructed to check his blood sugars 1 times a day, fasting. Call [facility] if: BG persistently < 70  I reviewed the Rule of 15 for the treatment of hypoglycemia in detail with the patient. Literature supplied.   2) Diabetic complications:  Eye: Does not have known diabetic retinopathy.  Patient encouraged to have an eye exam Neuro/ Feet: Does not have known diabetic peripheral neuropathy. Renal:  Patient does  have known baseline CKD. He is  on an ACEI/ARB at present.  3) Secondary Adrenal Insufficiency:  -Patient had low serum cortisol during hospitalization , cosyntropin stimulation test was slightly abnormal -Repeat cosyntropin stimulation test shows worsening cortisol level 06/2022 -Patient with fatigue and dizziness -ACTH at the lower end of normal -Due to miscommunication, the patient did not start glucocorticoids in June.  Will start hydrocortisone as below -Patient to obtain a medic alert bracelet -Discussed sick day rule   Medication Start hydrocortisone 10 mg, 1.5 tablets with breakfast and half a tablet in the afternoon   Follow-up in 6 months    Signed electronically by: Lyndle Herrlich, MD  [facility]  [facility] [address] [place], [place] [address] Phone: [phone] FAX: [phone]   CC: Rema Fendt, NP [address] [place] [address] Phone: [phone]  Fax: [phone]    Return to Endocrinology clinic as below: No future appointments.

## 2022-11-30 ENCOUNTER — Ambulatory Visit: Payer: 59 | Admitting: Physician Assistant

## 2023-01-11 ENCOUNTER — Other Ambulatory Visit: Payer: Self-pay | Admitting: Family

## 2023-01-11 DIAGNOSIS — E785 Hyperlipidemia, unspecified: Secondary | ICD-10-CM

## 2023-01-15 ENCOUNTER — Other Ambulatory Visit: Payer: Self-pay | Admitting: Urology

## 2023-01-15 DIAGNOSIS — D2922 Benign neoplasm of left testis: Secondary | ICD-10-CM

## 2023-01-25 ENCOUNTER — Other Ambulatory Visit: Payer: 59

## 2023-01-26 ENCOUNTER — Ambulatory Visit
Admission: RE | Admit: 2023-01-26 | Discharge: 2023-01-26 | Disposition: A | Discharge status: Home / Self Care | Payer: 59 | Source: Ambulatory Visit | Attending: Urology

## 2023-01-26 DIAGNOSIS — D2922 Benign neoplasm of left testis: Secondary | ICD-10-CM

## 2023-01-30 ENCOUNTER — Emergency Department (HOSPITAL_COMMUNITY)
Admission: EM | Admit: 2023-01-30 | Discharge: 2023-01-30 | Discharge status: Against Medical Advice | Payer: 59 | Attending: Emergency Medicine | Admitting: Emergency Medicine

## 2023-01-30 ENCOUNTER — Encounter (HOSPITAL_COMMUNITY): Payer: Self-pay

## 2023-01-30 ENCOUNTER — Emergency Department (HOSPITAL_COMMUNITY): Payer: 59

## 2023-01-30 DIAGNOSIS — R531 Weakness: Secondary | ICD-10-CM | POA: Insufficient documentation

## 2023-01-30 DIAGNOSIS — Z8673 Personal history of transient ischemic attack (TIA), and cerebral infarction without residual deficits: Secondary | ICD-10-CM | POA: Diagnosis not present

## 2023-01-30 DIAGNOSIS — Z20822 Contact with and (suspected) exposure to covid-19: Secondary | ICD-10-CM | POA: Insufficient documentation

## 2023-01-30 DIAGNOSIS — J101 Influenza due to other identified influenza virus with other respiratory manifestations: Secondary | ICD-10-CM | POA: Insufficient documentation

## 2023-01-30 DIAGNOSIS — Z5321 Procedure and treatment not carried out due to patient leaving prior to being seen by health care provider: Secondary | ICD-10-CM | POA: Diagnosis not present

## 2023-01-30 LAB — CBC WITH DIFFERENTIAL/PLATELET
Abs Immature Granulocytes: 0.02 10*3/uL (ref 0.00–0.07)
Basophils Absolute: 0 10*3/uL (ref 0.0–0.1)
Basophils Relative: 0 %
Eosinophils Absolute: 0.1 10*3/uL (ref 0.0–0.5)
Eosinophils Relative: 1 %
HCT: 40.3 % (ref 39.0–52.0)
Hemoglobin: 13.1 g/dL (ref 13.0–17.0)
Immature Granulocytes: 0 %
Lymphocytes Relative: 12 %
Lymphs Abs: 0.8 10*3/uL (ref 0.7–4.0)
MCH: 29.4 pg (ref 26.0–34.0)
MCHC: 32.5 g/dL (ref 30.0–36.0)
MCV: 90.4 fL (ref 80.0–100.0)
Monocytes Absolute: 0.5 10*3/uL (ref 0.1–1.0)
Monocytes Relative: 8 %
Neutro Abs: 5.2 10*3/uL (ref 1.7–7.7)
Neutrophils Relative %: 79 %
Platelets: 137 10*3/uL — ABNORMAL LOW (ref 150–400)
RBC: 4.46 MIL/uL (ref 4.22–5.81)
RDW: 14.3 % (ref 11.5–15.5)
WBC: 6.6 10*3/uL (ref 4.0–10.5)
nRBC: 0 % (ref 0.0–0.2)

## 2023-01-30 LAB — BASIC METABOLIC PANEL
Anion gap: 8 (ref 5–15)
BUN: 19 mg/dL (ref 8–23)
CO2: 23 mmol/L (ref 22–32)
Calcium: 9.2 mg/dL (ref 8.9–10.3)
Chloride: 107 mmol/L (ref 98–111)
Creatinine, Ser: 1.93 mg/dL — ABNORMAL HIGH (ref 0.61–1.24)
GFR, Estimated: 38 mL/min — ABNORMAL LOW (ref >60)
Glucose, Bld: 123 mg/dL — ABNORMAL HIGH (ref 70–99)
Potassium: 3.5 mmol/L (ref 3.5–5.1)
Sodium: 138 mmol/L (ref 135–145)

## 2023-01-30 LAB — CBG MONITORING, ED: Glucose-Capillary: 117 mg/dL — ABNORMAL HIGH (ref 70–99)

## 2023-01-30 LAB — RESP PANEL BY RT-PCR (RSV, FLU A&B, COVID)  RVPGX2
Influenza A by PCR: POSITIVE — AB
Influenza B by PCR: NEGATIVE
Resp Syncytial Virus by PCR: NEGATIVE
SARS Coronavirus 2 by RT PCR: NEGATIVE

## 2023-01-30 NOTE — ED Provider Triage Note (Signed)
 Emergency Medicine Provider Triage Evaluation Note  Curtis Saunders , a 68 y.o. male  was evaluated in triage.  Pt complains of off balance since 9 PM last night.  Patient does have history of strokes but states this feels different.  Patient states when he stands up he feels unstable and notes right leg weakness which is new.  Patient denies fevers but thinks he may have a viral illness.  Patient denies sick contacts, chest pain, shortness of breath, cough, abdominal pain, nausea/vomiting.  Patient does have history of strokes without deficit along with adrenal insufficiency. Patient is not on any blood thinners.  Review of Systems  Positive:  Negative:   Physical Exam  BP (!) 145/103   Pulse 99   Temp 100 F (37.8 C) (Oral)   Resp 18   SpO2 97%  Gen:   Awake, no distress   Resp:  Normal effort  MSK:   Moves extremities without difficulty  Other:  Appears unsteady when walking as his right leg does appear weak, pupils PERRL, equal smile  Medical Decision Making  Medically screening exam initiated at 3:20 PM.  Appropriate orders placed.  Curtis Saunders was informed that the remainder of the evaluation will be completed by another provider, this initial triage assessment does not replace that evaluation, and the importance of remaining in the ED until their evaluation is complete.  Workup initiated, labs and imaging obtained, patient stable at this time   Curtis Saunders Curtis Saunders 01/30/23 1522

## 2023-01-30 NOTE — ED Triage Notes (Signed)
 Pt presents with c/o issues with his balance. Pt reports that since yesterday, his balance has been off. He reports he is able to stand but then he feels off balance and falls. Pt denies any other symptoms at this time.

## 2023-02-01 ENCOUNTER — Encounter (HOSPITAL_COMMUNITY): Payer: Self-pay

## 2023-02-01 ENCOUNTER — Emergency Department (HOSPITAL_COMMUNITY)
Admission: EM | Admit: 2023-02-01 | Discharge: 2023-02-01 | Disposition: A | Discharge status: Home / Self Care | Payer: 59 | Attending: Emergency Medicine | Admitting: Emergency Medicine

## 2023-02-01 ENCOUNTER — Other Ambulatory Visit: Payer: Self-pay

## 2023-02-01 ENCOUNTER — Emergency Department (HOSPITAL_COMMUNITY): Payer: 59

## 2023-02-01 DIAGNOSIS — E119 Type 2 diabetes mellitus without complications: Secondary | ICD-10-CM | POA: Diagnosis not present

## 2023-02-01 DIAGNOSIS — H81399 Other peripheral vertigo, unspecified ear: Secondary | ICD-10-CM | POA: Insufficient documentation

## 2023-02-01 DIAGNOSIS — J101 Influenza due to other identified influenza virus with other respiratory manifestations: Secondary | ICD-10-CM | POA: Insufficient documentation

## 2023-02-01 DIAGNOSIS — Z8673 Personal history of transient ischemic attack (TIA), and cerebral infarction without residual deficits: Secondary | ICD-10-CM | POA: Insufficient documentation

## 2023-02-01 DIAGNOSIS — Z79899 Other long term (current) drug therapy: Secondary | ICD-10-CM | POA: Diagnosis not present

## 2023-02-01 DIAGNOSIS — E860 Dehydration: Secondary | ICD-10-CM | POA: Diagnosis not present

## 2023-02-01 DIAGNOSIS — Z7982 Long term (current) use of aspirin: Secondary | ICD-10-CM | POA: Insufficient documentation

## 2023-02-01 DIAGNOSIS — R42 Dizziness and giddiness: Secondary | ICD-10-CM | POA: Diagnosis present

## 2023-02-01 DIAGNOSIS — I10 Essential (primary) hypertension: Secondary | ICD-10-CM | POA: Diagnosis not present

## 2023-02-01 LAB — BASIC METABOLIC PANEL
Anion gap: 12 (ref 5–15)
BUN: 25 mg/dL — ABNORMAL HIGH (ref 8–23)
CO2: 18 mmol/L — ABNORMAL LOW (ref 22–32)
Calcium: 9.1 mg/dL (ref 8.9–10.3)
Chloride: 109 mmol/L (ref 98–111)
Creatinine, Ser: 2.21 mg/dL — ABNORMAL HIGH (ref 0.61–1.24)
GFR, Estimated: 32 mL/min — ABNORMAL LOW (ref >60)
Glucose, Bld: 127 mg/dL — ABNORMAL HIGH (ref 70–99)
Potassium: 4.2 mmol/L (ref 3.5–5.1)
Sodium: 139 mmol/L (ref 135–145)

## 2023-02-01 LAB — CBC
HCT: 44.2 % (ref 39.0–52.0)
Hemoglobin: 14.9 g/dL (ref 13.0–17.0)
MCH: 29.7 pg (ref 26.0–34.0)
MCHC: 33.7 g/dL (ref 30.0–36.0)
MCV: 88 fL (ref 80.0–100.0)
Platelets: 123 10*3/uL — ABNORMAL LOW (ref 150–400)
RBC: 5.02 MIL/uL (ref 4.22–5.81)
RDW: 14.3 % (ref 11.5–15.5)
WBC: 6.8 10*3/uL (ref 4.0–10.5)
nRBC: 0 % (ref 0.0–0.2)

## 2023-02-01 LAB — URINALYSIS, ROUTINE W REFLEX MICROSCOPIC
Bilirubin Urine: NEGATIVE
Glucose, UA: NEGATIVE mg/dL
Hgb urine dipstick: NEGATIVE
Ketones, ur: 5 mg/dL — AB
Leukocytes,Ua: NEGATIVE
Nitrite: NEGATIVE
Protein, ur: 100 mg/dL — AB
Specific Gravity, Urine: 1.023 (ref 1.005–1.030)
pH: 5 (ref 5.0–8.0)

## 2023-02-01 LAB — CBG MONITORING, ED: Glucose-Capillary: 128 mg/dL — ABNORMAL HIGH (ref 70–99)

## 2023-02-01 MED ORDER — SODIUM CHLORIDE 0.9 % IV BOLUS
1000.0000 mL | Freq: Once | INTRAVENOUS | Status: AC
  Administered 2023-02-01: 1000 mL via INTRAVENOUS

## 2023-02-01 MED ORDER — MECLIZINE HCL 25 MG PO TABS: 25.0000 mg | ORAL_TABLET | Freq: Three times a day (TID) | ORAL | 0 refills | Status: DC | PRN

## 2023-02-01 MED ORDER — MECLIZINE HCL 25 MG PO TABS
25.0000 mg | ORAL_TABLET | Freq: Once | ORAL | Status: AC
  Administered 2023-02-01: 25 mg via ORAL
  Filled 2023-02-01: qty 1

## 2023-02-01 NOTE — ED Provider Triage Note (Signed)
 Emergency Medicine Provider Triage Evaluation Note  Curtis Saunders , a 68 y.o. male  was evaluated in triage.  Pt complains of dizziness, general weakness, myalgia, and malaise. Also complains of productive cough, fever (Tmax 100F) started two days ago  Today, sought evaluation due to having a fall onto knees due to generalized weakness, without LOC, while ambulating to bathroom. No knee, hip pain nor head injury. Went to [facility] ED on 1/7 for dizziness, general weakness, myalgia, and malaise. Also complains of productive cough, fever (Tmax 100F). Labs significant for +Influenza  Has been taking Day and Nightquil without relief. Cannot take Tylenol  nor ibuprofen   Review of Systems  Positive: dizziness, general weakness, myalgia, malaise, fever Negative:   Physical Exam  BP 96/77   Pulse (!) 113   Temp 97.8 F (36.6 C) (Oral)   Resp 18   Ht 6' (1.829 m)   Wt 91.6 kg   SpO2 97%   BMI 27.40 kg/m  Gen:   Awake, no distress   Resp:  Normal effort  MSK:   Moves extremities without difficulty  Other:    Medical Decision Making  Medically screening exam initiated at 1:17 PM.  Appropriate orders placed.  Curtis Saunders was informed that the remainder of the evaluation will be completed by another provider, this initial triage assessment does not replace that evaluation, and the importance of remaining in the ED until their evaluation is complete.  Labs ordered   Curtis Saunders, [place] 02/01/23 1335

## 2023-02-01 NOTE — ED Notes (Signed)
 Noted pt transferred self from wheelchair to lobby chair w/o assist.

## 2023-02-01 NOTE — ED Provider Notes (Signed)
 [place] EMERGENCY DEPARTMENT AT [facility] Provider Note   CSN: [id number] Arrival date & time: 02/01/23  1107     History  Chief Complaint  Patient presents with   Dizziness    Curtis Saunders is a 68 y.o. male.  Pt is a 68 yo male with pmhx significant for htn, cva, and dm.  He has had fever and cough.  Pt came to the ED on 1/7 for uri sx and dizziness, but left after triage due to the wait.  Ct neg.  Flu +.  Pt said he continues to feel dizzy and weak.         Home Medications Prior to Admission medications   Medication Sig Start Date End Date Taking? Authorizing Provider  meclizine  (ANTIVERT ) 25 MG tablet Take 1 tablet (25 mg total) by mouth 3 (three) times daily as needed for dizziness. 02/01/23  Yes Dean Clarity, MD  Accu-Chek FastClix Lancets MISC Apply topically. 03/07/22   [provider]  aspirin  EC 81 MG EC tablet Take 1 tablet (81 mg total) by mouth daily. Swallow whole. 02/28/20   Ghimire, Kuber, MD  blood glucose meter kit and supplies Dispense based on patient and insurance preference. Use up to four times daily as directed. (FOR ICD-10 E10.9, E11.9). 03/02/22   Rai, Ripudeep K, MD  Blood Glucose Monitoring Suppl (ACCU-CHEK AVIVA PLUS) w/Device KIT 1 each by Does not apply route 4 (four) times daily -  before meals and at bedtime. 03/07/22   Lorren Greig PARAS, NP  finasteride (PROSCAR) 5 MG tablet Take 5 mg by mouth daily. 11/24/22   [provider]  glucose blood (ACCU-CHEK GUIDE) test strip Use as instructed 03/07/22   Lorren Greig PARAS, NP  hydrocortisone  (CORTEF ) 10 MG tablet Take 1.5 tablets (15 mg total) by mouth daily with breakfast AND 0.5 tablets (5 mg total) daily with supper. 11/28/22   Shamleffer, Ibtehal Jaralla, MD  Lancets Misc. (ACCU-CHEK FASTCLIX LANCET) KIT 1 each by Does not apply route 4 (four) times daily -  before meals and at bedtime. 03/07/22   Lorren Greig PARAS, NP  ramipril  (ALTACE ) 10 MG capsule Take 10 mg by mouth  daily. 05/26/22   [provider]  rosuvastatin  (CRESTOR ) 20 MG tablet Take 1 tablet by mouth once daily 11/03/22   Lorren Greig PARAS, NP  Semaglutide ,0.25 or 0.5MG /DOS, 2 MG/3ML SOPN Inject 0.5 mg into the skin once a week. 11/28/22   Shamleffer, Ibtehal Jaralla, MD  tamsulosin  (FLOMAX ) 0.4 MG CAPS capsule Take 1 capsule (0.4 mg total) by mouth daily. 03/02/22   Davia Nydia POUR, MD      Allergies    Bee venom    Review of Systems   Review of Systems  HENT:  Positive for congestion.   Neurological:  Positive for dizziness.  All other systems reviewed and are negative.   Physical Exam Updated Vital Signs BP 117/64   Pulse (!) 106   Temp 97.8 F (36.6 C) (Oral)   Resp 16   Ht 6' (1.829 m)   Wt 91.6 kg   SpO2 98%   BMI 27.40 kg/m  Physical Exam Vitals and nursing note reviewed.  Constitutional:      Appearance: Normal appearance.  HENT:     Head: Normocephalic and atraumatic.     Right Ear: External ear normal.     Left Ear: External ear normal.     Nose: Nose normal.     Mouth/Throat:  Mouth: Mucous membranes are dry.  Eyes:     Extraocular Movements: Extraocular movements intact.     Conjunctiva/sclera: Conjunctivae normal.     Pupils: Pupils are equal, round, and reactive to light.  Cardiovascular:     Rate and Rhythm: Regular rhythm. Tachycardia present.     Pulses: Normal pulses.     Heart sounds: Normal heart sounds.  Pulmonary:     Effort: Pulmonary effort is normal.     Breath sounds: Normal breath sounds.  Abdominal:     General: Abdomen is flat. Bowel sounds are normal.     Palpations: Abdomen is soft.  Musculoskeletal:        General: Normal range of motion.     Cervical back: Normal range of motion and neck supple.  Skin:    General: Skin is warm.     Capillary Refill: Capillary refill takes less than 2 seconds.  Neurological:     General: No focal deficit present.     Mental Status: He is alert and oriented to person, place, and time.   Psychiatric:        Mood and Affect: Mood normal.        Behavior: Behavior normal.     ED Results / Procedures / Treatments   Labs (all labs ordered are listed, but only abnormal results are displayed) Labs Reviewed  BASIC METABOLIC PANEL - Abnormal; Notable for the following components:      Result Value   CO2 18 (*)    Glucose, Bld 127 (*)    BUN 25 (*)    Creatinine, Ser 2.21 (*)    GFR, Estimated 32 (*)    All other components within normal limits  CBC - Abnormal; Notable for the following components:   Platelets 123 (*)    All other components within normal limits  URINALYSIS, ROUTINE W REFLEX MICROSCOPIC - Abnormal; Notable for the following components:   APPearance CLOUDY (*)    Ketones, ur 5 (*)    Protein, ur 100 (*)    Bacteria, UA RARE (*)    Crystals PRESENT (*)    All other components within normal limits  CBG MONITORING, ED - Abnormal; Notable for the following components:   Glucose-Capillary 128 (*)    All other components within normal limits    EKG EKG Interpretation Date/Time:  Thursday February 01 2023 11:20:26 EST Ventricular Rate:  115 PR Interval:  158 QRS Duration:  88 QT Interval:  326 QTC Calculation: 450 R Axis:   123  Text Interpretation: Sinus tachycardia Possible Left atrial enlargement Right axis deviation Possible Anterior infarct , age undetermined Abnormal ECG When compared with ECG of 30-Jan-2023 15:27, PREVIOUS ECG IS PRESENT Since last tracing rate faster Confirmed by Dean Clarity [phone]) on 02/01/2023 1:35:05 PM  Radiology DG Chest Portable 1 View Result Date: 02/01/2023 CLINICAL DATA:  Shortness of breath and dizziness. EXAM: PORTABLE CHEST 1 VIEW COMPARISON:  Chest radiograph dated 02/27/2020. FINDINGS: The heart size and mediastinal contours are within normal limits. Both lungs are clear. The visualized skeletal structures are unremarkable. IMPRESSION: No active disease. Electronically Signed   By: Vanetta Chou M.D.   On:  02/01/2023 14:47    Procedures Procedures    Medications Ordered in ED Medications  sodium chloride  0.9 % bolus 1,000 mL (1,000 mLs Intravenous New Bag/Given 02/01/23 1418)  meclizine  (ANTIVERT ) tablet 25 mg (25 mg Oral Given 02/01/23 1417)  sodium chloride  0.9 % bolus 1,000 mL (1,000 mLs Intravenous New Bag/Given  02/01/23 1608)    ED Course/ Medical Decision Making/ A&P                                 Medical Decision Making Amount and/or Complexity of Data Reviewed Labs: ordered. Radiology: ordered.   This patient presents to the ED for concern of dizziness and uri sx, this involves an extensive number of treatment options, and is a complaint that carries with it a high risk of complications and morbidity.  The differential diagnosis includes covid/flu/rsv, cva, electrolyte abn   Co morbidities that complicate the patient evaluation  htn, cva, ckd and dm   Additional history obtained:  Additional history obtained from epic chart review External records from outside source obtained and reviewed including family   Lab Tests:  I Ordered, and personally interpreted labs.  The pertinent results include:  cbc nl, bmp with chronic cr elevation at 2.21, ua + ketones   Imaging Studies ordered:  I ordered imaging studies including cxr  I independently visualized and interpreted imaging which showed No active disease.  I agree with the radiologist interpretation   Cardiac Monitoring:  The patient was maintained on a cardiac monitor.  I personally viewed and interpreted the cardiac monitored which showed an underlying rhythm of: st, now nsr   Medicines ordered and prescription drug management:  I ordered medication including ivfs/meclizine   for sx  Reevaluation of the patient after these medicines showed that the patient improved I have reviewed the patients home medicines and have made adjustments as needed   Test Considered:  mri   Critical  Interventions:  ivfs   Problem List / ED Course:  Influenza A with dehydration and vertigo:  Pt's hr improved at rest, but went up with ambulation after 1L.  O2 sats stayed ok.  After 2nd L, pt is improved.  He is eating and drinking now.   Sx have been going on too long for tamiflu.  Pt d/c with meclizine .  He knows to return if worse. F/u with pcp.   Reevaluation:  After the interventions noted above, I reevaluated the patient and found that they have :improved   Social Determinants of Health:  Lives at home   Dispostion:  After consideration of the diagnostic results and the patients response to treatment, I feel that the patent would benefit from discharge with outpatient f/u.          Final Clinical Impression(s) / ED Diagnoses Final diagnoses:  Influenza A  Peripheral vertigo, unspecified laterality  Dehydration    Rx / DC Orders ED Discharge Orders          Ordered    meclizine  (ANTIVERT ) 25 MG tablet  3 times daily PRN        02/01/23 1609              Dean Clarity, MD 02/01/23 1630

## 2023-02-01 NOTE — ED Triage Notes (Signed)
 Patient has had dizziness for 3 days along with feeling like both of his legs are weak. Increased falls. Had one today after his legs gave out. Denies blood thinners.

## 2023-02-07 ENCOUNTER — Other Ambulatory Visit: Payer: Self-pay | Admitting: Family

## 2023-02-07 DIAGNOSIS — E785 Hyperlipidemia, unspecified: Secondary | ICD-10-CM

## 2023-02-18 ENCOUNTER — Other Ambulatory Visit: Payer: Self-pay | Admitting: Cardiology

## 2023-04-16 ENCOUNTER — Other Ambulatory Visit: Payer: Self-pay | Admitting: Family

## 2023-04-16 DIAGNOSIS — E785 Hyperlipidemia, unspecified: Secondary | ICD-10-CM

## 2023-05-23 ENCOUNTER — Telehealth: Payer: Self-pay | Admitting: Cardiology

## 2023-05-23 NOTE — Telephone Encounter (Signed)
Patient returned staff call. 

## 2023-05-23 NOTE — Telephone Encounter (Signed)
 Called pt and left message asking pt to call back about needing a refill on medication amlodipine . This medication was D/C in the hospital in 2024 and no longer on pt's medication list. Should pt still be taking this medication? Please address

## 2023-05-23 NOTE — Telephone Encounter (Signed)
 Pt c/o medication issue:  1. Name of Medication:    2. How are you currently taking this medication (dosage and times per day)?    3. Are you having a reaction (difficulty breathing--STAT)? no  4. What is your medication issue? Patient was calling for refill but its not listed under current medication. Please advise

## 2023-05-23 NOTE — Telephone Encounter (Signed)
 Spoke with patient and informed him amlodipine  was d/c in the hospital. He would like provider to confirm he is no longer supposed to be taking his amlodipine  because he has been taking it.

## 2023-05-24 NOTE — Telephone Encounter (Signed)
 Left voicemail to return call to office

## 2023-05-24 NOTE — Telephone Encounter (Signed)
 Attempted phone call to pt, ok per EPIC to leave detailed message.  Pt advised per Dr Lavonne Prairie pt should be off his Amlodipine  and to call.  Pt to call [phone] for any questions or concerns.

## 2023-05-24 NOTE — Telephone Encounter (Signed)
Pt returning nurse call. Please advise.

## 2023-05-24 NOTE — Telephone Encounter (Signed)
 Patient is returning call.

## 2023-05-24 NOTE — Telephone Encounter (Signed)
 Spoke with pt and advised pt should be off his amlodipine  per Dr Lavonne Prairie.  Pt verbalizes understanding and thanked Charity fundraiser for the call.

## 2023-05-28 ENCOUNTER — Ambulatory Visit (INDEPENDENT_AMBULATORY_CARE_PROVIDER_SITE_OTHER): Payer: 59 | Admitting: Internal Medicine

## 2023-05-28 ENCOUNTER — Encounter: Payer: Self-pay | Admitting: Internal Medicine

## 2023-05-28 VITALS — BP 138/70 | HR 72 | Ht 72.0 in | Wt 209.0 lb

## 2023-05-28 DIAGNOSIS — Z794 Long term (current) use of insulin: Secondary | ICD-10-CM | POA: Diagnosis not present

## 2023-05-28 DIAGNOSIS — E2749 Other adrenocortical insufficiency: Secondary | ICD-10-CM | POA: Diagnosis not present

## 2023-05-28 DIAGNOSIS — N1832 Chronic kidney disease, stage 3b: Secondary | ICD-10-CM

## 2023-05-28 DIAGNOSIS — E1122 Type 2 diabetes mellitus with diabetic chronic kidney disease: Secondary | ICD-10-CM

## 2023-05-28 DIAGNOSIS — E1159 Type 2 diabetes mellitus with other circulatory complications: Secondary | ICD-10-CM

## 2023-05-28 LAB — POCT GLYCOSYLATED HEMOGLOBIN (HGB A1C): Hemoglobin A1C: 5.9 % — AB (ref 4.0–5.6)

## 2023-05-28 NOTE — Patient Instructions (Signed)
-   Decrease Ozempic 0.25 mg weekly

## 2023-05-28 NOTE — Progress Notes (Unsigned)
 Name: Curtis Saunders  MRN/ DOB: [id number], [birth year]   Age/ Sex: 68 y.o., male    PCP: Senaida Dama, NP   Reason for Endocrinology Evaluation: Type 2 Diabetes Mellitus     Date of Initial Endocrinology Visit: 06/16/2022    PATIENT IDENTIFIER: Curtis Saunders is a 68 y.o. male with a past medical history of DM, HTN, CVA. The patient presented for initial endocrinology clinic visit on 06/16/2022 for consultative assistance with his diabetes management.    HPI: Curtis Saunders was    Diagnosed with DM 06/2021, pt presented to the ED with severe hyperglycemia >600 mg/dL in 03/1515, he was discharged on insulin  mix , he did have DKA at the time. Prior Medications tried/Intolerance: levemir            Hemoglobin A1c has ranged from 7.3% in 2023, peaking at 14.4% in 2024.  On his initial visit to our clinic he had an A1c of 6.9%, was on Levemir  only, started Ozempic      Adrenal HISTORY:  Patient had an abnormal cosyntropin  stimulation test 03/15/2022 with a baseline cortisol of 8.5 UG/DL, with a 61-YWVPXT cortisol at 17.1 UG/DL. He was hospitalized for constipation at the time  with decrease urine output, was noted with hypokalemia , serum cortisol was 2.8  Brain MRI and adrenal imagine negative    Repeat cosyntropin  stimulation test was abnormal 07/05/2022 with a 60-minute cortisol of 12.9 UG/DL ACTH  at the lower limit of normal of 6 PG/mL (reference 6-50)  Due to miscommunication, the patient did not start prednisone  as initially advised in June, and was started on hydrocortisone  11/2022   SUBJECTIVE:   During the last visit (11/28/2022): A1c 6.9%    Today (05/28/23): Curtis Saunders is here for follow-up on diabetes management and adrenal insufficiency.  He checks his blood sugars occasionally. The patient has not had hypoglycemic episodes since the last clinic visit    Pt follows with cardiology for LVH    Denies nausea or vomiting  Denies  constipation or diarrhea  Energy level has improved  Denies dizziness     HOME ENDOCRINE REGIMEN: Ozempic  0.5 mg weekly HC 10 mg , 1.5 tabs with breakfast and half in afternoon     Statin: Yes ACE-I/ARB: yes   METER DOWNLOAD SUMMARYn/a 75-151 mg/dL  DIABETIC COMPLICATIONS: Microvascular complications:  CKD III Denies:  Last eye exam: Completed yrs ago   Macrovascular complications:  CVA Denies: CAD, PVD   PAST HISTORY: Past Medical History:  Past Medical History:  Diagnosis Date   Diabetes mellitus (HCC)    Hypertension    Stroke (HCC) 2023   x 2, no deficits   Past Surgical History:  Past Surgical History:  Procedure Laterality Date   APPENDECTOMY     EYE SURGERY     HAND SURGERY     KNEE SURGERY     x3, x1 R   LIPOMA EXCISION Left 10/13/2019   Procedure: SHOULDER EXCISION LIPOMA AND ROTATOR CUFF REPAIR;  Surgeon: Sammye Cristal, MD;  Location: [facility];  Service: Orthopedics;  Laterality: Left;   TONSILLECTOMY      Social History:  reports that he has never smoked. He has never been exposed to tobacco smoke. He has never used smokeless tobacco. He reports current alcohol use. He reports that he does not use drugs. Family History:  Family History  Problem Relation Age of Onset   Heart attack Mother 58   COPD Mother    Emphysema  Mother    Diabetes Father    Diabetes Sister    Hypertension Sister    Sleep apnea Neg Hx      HOME MEDICATIONS: Allergies as of 05/28/2023       Reactions   Bee Venom Anaphylaxis        Medication List        Accurate as of May 28, 2023  2:00 PM. If you have any questions, ask your nurse or doctor.          STOP taking these medications    amLODipine  10 MG tablet Commonly known as: NORVASC  Stopped by: Mallori Araque J Havannah Streat       TAKE these medications    Accu-Chek Aviva Plus w/Device Kit 1 each by Does not apply route 4 (four) times daily -  before meals and at bedtime.    Accu-Chek [employer] Kit 1 each by Does not apply route 4 (four) times daily -  before meals and at bedtime.   Accu-Chek FastClix Lancets Misc Apply topically.   Accu-Chek Guide test strip Generic drug: glucose blood Use as instructed   aspirin  EC 81 MG tablet Take 1 tablet (81 mg total) by mouth daily. Swallow whole.   blood glucose meter kit and supplies Dispense based on patient and insurance preference. Use up to four times daily as directed. (FOR ICD-10 E10.9, E11.9).   finasteride 5 MG tablet Commonly known as: PROSCAR Take 5 mg by mouth daily.   hydrocortisone  10 MG tablet Commonly known as: CORTEF  Take 1.5 tablets (15 mg total) by mouth daily with breakfast AND 0.5 tablets (5 mg total) daily with supper.   meclizine  25 MG tablet Commonly known as: ANTIVERT  Take 1 tablet (25 mg total) by mouth 3 (three) times daily as needed for dizziness.   predniSONE  5 MG tablet Commonly known as: DELTASONE  Take 5 mg by mouth every morning.   ramipril  10 MG capsule Commonly known as: ALTACE  Take 1 capsule by mouth once daily   rosuvastatin  20 MG tablet Commonly known as: CRESTOR  Take 1 tablet by mouth once daily   Semaglutide (0.25 or 0.5MG /DOS) 2 MG/3ML Sopn Inject 0.5 mg into the skin once a week.   tamsulosin  0.4 MG Caps capsule Commonly known as: FLOMAX  Take 1 capsule (0.4 mg total) by mouth daily.         ALLERGIES: Allergies  Allergen Reactions   Bee Venom Anaphylaxis     REVIEW OF SYSTEMS: A comprehensive ROS was conducted with the patient and is negative except as per HPI    OBJECTIVE:   VITAL SIGNS: BP 138/70 (BP Location: Left Arm, Patient Position: Sitting, Cuff Size: Normal)   Pulse 72   Ht 6' (1.829 m)   Wt 209 lb (94.8 kg)   SpO2 99%   BMI 28.35 kg/m      Body surface area is 2.19 meters squared.   PHYSICAL EXAM:  General: Pt appears well and is in NAD  Lungs: Clear with good BS bilat   Heart: RRR   Extremities:  Lower  extremities - No pretibial edema.   Neuro: MS is good with appropriate affect, pt is alert and Ox3    DM foot exam: 05/28/2023  The skin of the feet is without sores or ulcerations, with thickened and discolored nails  The pedal pulses are 2+ on right and 2+ on left. The sensation is intact to a screening 5.07, 10 gram monofilament bilaterally   DATA REVIEWED:  Lab Results  Component Value  Date   HGBA1C 5.9 (A) 05/28/2023   HGBA1C 6.1 (A) 11/28/2022   HGBA1C 6.9 (A) 06/16/2022    Latest Reference Range & Units 05/28/23 14:16  Sodium 135 - 146 mmol/L 143  Potassium 3.5 - 5.3 mmol/L 4.1  Chloride 98 - 110 mmol/L 108  CO2 20 - 32 mmol/L 28  Glucose 65 - 99 mg/dL 88  BUN 7 - 25 mg/dL 16  Creatinine 1.61 - 0.96 mg/dL 0.45 (H)  Calcium  8.6 - 10.3 mg/dL 9.6  BUN/Creatinine Ratio 6 - 22 (calc) 10  eGFR > OR = 60 mL/min/1.25m2 48 (L)  Total CHOL/HDL Ratio <5.0 (calc) 2.1  Cholesterol <200 mg/dL 409  HDL Cholesterol > OR = 40 mg/dL 63  LDL Cholesterol (Calc) mg/dL (calc) 56  MICROALB/CREAT RATIO <30 mg/g creat 7  Non-HDL Cholesterol (Calc) <130 mg/dL (calc) 68  Triglycerides <150 mg/dL 41    Latest Reference Range & Units 05/28/23 14:16  Microalb, Ur mg/dL 1.3  MICROALB/CREAT RATIO <30 mg/g creat 7  Creatinine, Urine 20 - 320 mg/dL 811     Old records , labs and images have been reviewed.    ASSESSMENT / PLAN / RECOMMENDATIONS:   1) Type 2 Diabetes Mellitus, Optimally controlled, With CKD III and macrovascular complications - Most recent A1c of 5.9 %. Goal A1c < 7.0 %.    -I have praised the patient improved glycemic control - Patient with DKA 02/2022, NOT a candidate for SGLT2 inhibitors - Has been off basal insulin  11/2022 - Patient would like to decrease medication, will proceed as below  MEDICATIONS: Decrease Ozempic   0.25 mg weekly   EDUCATION / INSTRUCTIONS: BG monitoring instructions: Patient is instructed to check his blood sugars 1 times a day, fasting. Call  [facility] if: BG persistently < 70  I reviewed the Rule of 15 for the treatment of hypoglycemia in detail with the patient. Literature supplied.   2) Diabetic complications:  Eye: Does not have known diabetic retinopathy.  Patient encouraged to have an eye exam Neuro/ Feet: Does not have known diabetic peripheral neuropathy. Renal: Patient does  have known baseline CKD. He is  on an ACEI/ARB at present.  3) Secondary Adrenal Insufficiency:  -Patient had low serum cortisol during hospitalization , cosyntropin  stimulation test was slightly abnormal -Repeat cosyntropin  stimulation test shows worsening cortisol level 06/2022 -ACTH  at the lower end of normal - I had initially prescribed prednisone , but the patient never started it, I subsequently prescribed hydrocortisone , but today the patient does not believe that he is taking anything and he was not sure what he was taking - ACTH /cortisol pending - I have contacted his local [facility] pharmacy and they have verified that the patient received 100-day supply of hydrocortisone  on 04/16/2023 and prior to that he received it in November of, 2024 - Patient also was dispensed prednisone  on/08/2023?  And prior to that he had pick up in June and August 2024 - My staff will contact the patient as we will discontinue hydrocortisone  and continue prednisone   Medication Stop hydrocortisone  10 mg, 1.5 tablets with breakfast and half a tablet in the afternoon Continue prednisone  5 mg daily  Follow-up in 6 months    Signed electronically by: Natale Bail, MD  [facility] Endocrinology  [facility] [address] [place] [place], [place] [address] Phone: [phone] FAX: [phone]   CC: Senaida Dama, NP [address] [place] [address] Phone: [phone]  Fax: [phone]    Return  to Endocrinology clinic as below: Future Appointments  Date Time Provider Department Center   08/27/2023 12:00 PM Eilleen Grates, MD [facility]

## 2023-05-29 ENCOUNTER — Encounter: Payer: Self-pay | Admitting: Internal Medicine

## 2023-05-29 ENCOUNTER — Telehealth: Payer: Self-pay | Admitting: Internal Medicine

## 2023-05-29 NOTE — Telephone Encounter (Signed)
 Can you please contact the patient and let him know to discontinue the hydrocortisone  but continue to take the prednisone  5 mg every morning.    Unfortunately his pharmacy dispense hydrocortisone  100-day supply on March 24.  But somehow they also dispense prednisone  on 4/8.   That is too much steroids for him and he needs to just stick with the prednisone  1 tablet every morning and normal hydrocortisone   Please make sure the patient looks at his bottles and verify which one to get rid of as he was pretty confused about his medication list yesterday  Thanks

## 2023-05-30 ENCOUNTER — Encounter (HOSPITAL_COMMUNITY): Payer: Self-pay | Admitting: *Deleted

## 2023-05-30 ENCOUNTER — Ambulatory Visit (HOSPITAL_COMMUNITY)
Admission: EM | Admit: 2023-05-30 | Discharge: 2023-05-30 | Disposition: A | Discharge status: Home / Self Care | Attending: Family Medicine | Admitting: Family Medicine

## 2023-05-30 DIAGNOSIS — L03313 Cellulitis of chest wall: Secondary | ICD-10-CM | POA: Diagnosis not present

## 2023-05-30 LAB — BASIC METABOLIC PANEL WITH GFR
BUN/Creatinine Ratio: 10 (calc) (ref 6–22)
BUN: 16 mg/dL (ref 7–25)
CO2: 28 mmol/L (ref 20–32)
Calcium: 9.6 mg/dL (ref 8.6–10.3)
Chloride: 108 mmol/L (ref 98–110)
Creat: 1.59 mg/dL — ABNORMAL HIGH (ref 0.70–1.35)
Glucose, Bld: 88 mg/dL (ref 65–99)
Potassium: 4.1 mmol/L (ref 3.5–5.3)
Sodium: 143 mmol/L (ref 135–146)
eGFR: 48 mL/min/{1.73_m2} — ABNORMAL LOW (ref >=60)

## 2023-05-30 LAB — LIPID PANEL
Cholesterol: 131 mg/dL (ref <200)
HDL: 63 mg/dL (ref >=40)
LDL Cholesterol (Calc): 56 mg/dL
Non-HDL Cholesterol (Calc): 68 mg/dL (ref <130)
Total CHOL/HDL Ratio: 2.1 (calc) (ref <5.0)
Triglycerides: 41 mg/dL (ref <150)

## 2023-05-30 LAB — MICROALBUMIN / CREATININE URINE RATIO
Creatinine, Urine: 190 mg/dL (ref 20–320)
Microalb Creat Ratio: 7 mg/g{creat} (ref <30)
Microalb, Ur: 1.3 mg/dL

## 2023-05-30 LAB — CORTISOL: Cortisol, Plasma: 1.7 ug/dL — ABNORMAL LOW

## 2023-05-30 LAB — ACTH: C206 ACTH: 6 pg/mL (ref 6–50)

## 2023-05-30 MED ORDER — DOXYCYCLINE HYCLATE 100 MG PO CAPS: 100.0000 mg | ORAL_CAPSULE | Freq: Two times a day (BID) | ORAL | 0 refills | Status: AC

## 2023-05-30 NOTE — Telephone Encounter (Signed)
 LMTCB

## 2023-05-30 NOTE — Discharge Instructions (Signed)
 Take doxycycline  100 mg --1 capsule 2 times daily for 7 days  You can also apply triple antibiotic ointment to the area a couple of times a day. Please do not squeeze the area or scratch it. You can keep it covered with a Band-Aid if that helps.  Please follow-up with your primary care about this issue

## 2023-05-30 NOTE — ED Triage Notes (Signed)
 Pt states he has a possible tick he noticed last night, on his left side. His GF rubbed it with alcohol.

## 2023-05-30 NOTE — Telephone Encounter (Signed)
 Patient found the correct bottle of medication and verbalized understanding that he will take 1 tab every morning of the Prednisone 

## 2023-05-30 NOTE — ED Provider Notes (Signed)
 [facility]    CSN: [id number] Arrival date & time: 05/30/23  1549      History   Chief Complaint Chief Complaint  Patient presents with   Insect Bite    HPI [name] Dentremont is a 68 y.o. male.   HPI Here for a spot that bled and was itchy.  He first noticed it last night on his left side.  He did scratch at it and that it bled.  He might of also squeezed it.  No purulent discharge was obtained.  He did not see any insect that he knows of for sure.  No fever or chills or trouble breathing Past Medical History:  Diagnosis Date   Diabetes mellitus (HCC)    Hypertension    Stroke (HCC) 2023   x 2, no deficits    Patient Active Problem List   Diagnosis Date Noted   Secondary adrenal insufficiency (HCC) 11/28/2022   Diabetes mellitus (HCC) 06/16/2022   Low serum cortisol level 06/16/2022   Benign essential HTN 03/11/2022   Uncontrolled type 2 diabetes mellitus with hyperglycemia, with long-term current use of insulin  (HCC) 03/11/2022   Acute urinary retention 03/11/2022   Obesity (BMI 30-39.9) 03/11/2022   Acute retention of urine 03/11/2022   Acute kidney injury superimposed on chronic kidney disease (HCC) 03/01/2022   New onset type 2 diabetes mellitus (HCC) 02/28/2022   BPH (benign prostatic hyperplasia) 02/28/2022   Pain in joint of right shoulder 01/09/2022   Partial thickness rotator cuff tear 01/09/2022   Osteoarthritis of right knee 11/08/2021   Pain in joint of right knee 11/08/2021   Low back pain 07/14/2021   Pain in joint of left shoulder 08/12/2020   Stage 3 chronic kidney disease (HCC) 04/12/2020   CVA (cerebral vascular accident) (HCC) 02/27/2020   Ascending aortic aneurysm (HCC) 02/11/2020   Double pterygium, 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 Authorizing Provider  Accu-Chek FastClix Lancets MISC Apply topically. 03/07/22  Yes [provider]  aspirin  EC 81 MG EC tablet Take 1 tablet (81 mg total) by mouth daily. Swallow whole. 02/28/20  Yes Ghimire, Kuber, MD  blood glucose meter kit and supplies Dispense based on patient and insurance preference. Use up to four times daily as directed. (FOR ICD-10 E10.9, E11.9). 03/02/22  Yes Rai, Ripudeep K, MD  Blood Glucose Monitoring Suppl (ACCU-CHEK AVIVA PLUS) w/Device KIT 1 each by Does not apply route 4 (four) times daily -  before meals and at bedtime. 03/07/22  Yes Rogerio Clay, Amy J, NP  doxycycline  (VIBRAMYCIN ) 100 MG capsule Take 1 capsule (100 mg total) by mouth 2 (two) times daily for 7 days. 05/30/23 06/06/23 Yes Vernia Teem K, MD  finasteride (PROSCAR) 5 MG tablet Take 5 mg by mouth daily. 11/24/22  Yes [provider]  glucose blood (ACCU-CHEK GUIDE) test strip Use as instructed 03/07/22  Yes Rogerio Clay, Amy J, NP  Lancets Misc. (ACCU-CHEK FASTCLIX LANCET) KIT 1 each by Does not apply route 4 (four) times daily -  before meals and at bedtime. 03/07/22  Yes Rogerio Clay, Amy J, NP  predniSONE  (DELTASONE ) 5 MG tablet Take 5 mg by mouth every morning. 05/01/23  Yes [provider]  ramipril  (ALTACE ) 10 MG capsule Take 1 capsule by mouth once daily 02/20/23  Yes Hochrein, Royston Cornea, MD  rosuvastatin  (CRESTOR ) 20 MG tablet Take 1 tablet by mouth once daily 04/16/23  Yes Rogerio Clay, Amy J, NP  Semaglutide ,0.25 or 0.5MG /DOS, 2 MG/3ML SOPN Inject 0.5 mg into the skin once a week.  11/28/22  Yes Shamleffer, Ibtehal Jaralla, MD  tamsulosin  (FLOMAX ) 0.4 MG CAPS capsule Take 1 capsule (0.4 mg total) by mouth daily. 03/02/22  Yes Rai, Hurman Maiden, MD    Family History Family History  Problem Relation Age of Onset   Heart attack Mother 36   COPD Mother    Emphysema Mother    Diabetes Father    Diabetes Sister    Hypertension Sister    Sleep apnea Neg Hx     Social History Social History   Tobacco Use   Smoking status: Never    Passive exposure: Never   Smokeless tobacco: Never  Vaping Use   Vaping status: Never Used  Substance Use Topics   Alcohol use: Yes    Comment: occasional   Drug use: No     Allergies   Bee venom   Review of Systems Review of Systems   Physical Exam Triage Vital Signs ED Triage Vitals  Encounter Vitals Group     BP 05/30/23 1636 (!) 179/105     Systolic BP Percentile --      Diastolic BP Percentile --      Pulse Rate 05/30/23 1636 (!) 57     Resp 05/30/23 1636 18     Temp 05/30/23 1636 98 F (36.7 C)     Temp Source 05/30/23 1636 Oral     SpO2 05/30/23 1636 96 %     Weight --      Height --      Head Circumference --      Peak Flow --      Pain Score 05/30/23 1634 0     Pain Loc --      Pain Education --      Exclude from Growth Chart --    No data found.  Updated Vital Signs BP (!) 179/105 (BP Location: Right Arm)   Pulse (!) 57   Temp 98 F (36.7 C) (Oral)   Resp 18   SpO2 96%   Visual Acuity Right Eye Distance:   Left Eye Distance:   Bilateral Distance:    Right Eye Near:   Left Eye Near:    Bilateral Near:     Physical Exam Vitals reviewed.  Constitutional:      General: He is not in acute distress.    Appearance: He is not ill-appearing, toxic-appearing or diaphoretic.  Skin:    Coloration: Skin is not jaundiced or pale.     Comments: There is a black slightly raised skin lesion about 4 or 5 mm x 2 to 3 mm on his left side in the mid axillary line at about the lower costal margin.  There  is a little bit of a halo of erythema around it.  There is no fluctuance  and I cannot discern any body of an insect at this time.  It also is not bleeding at this time.  Neurological:     Mental Status: He is alert.      UC Treatments / Results  Labs (all labs ordered are listed, but only abnormal results are displayed) Labs Reviewed - No data to display  EKG   Radiology No results found.  Procedures Procedures (including critical care time)  Medications Ordered in UC Medications - No data to display  Initial Impression / Assessment and Plan / UC Course  I have reviewed the triage vital signs and the nursing notes.  Pertinent labs & imaging results that were available during my care of the patient were reviewed by me and considered in my medical decision making (see chart for details).     Doxycycline  is sent in for possible cellulitis and possible tick exposure.  I have asked him to follow-up with his primary care so that if this is not resolving completely in the next week or 2, he can see them and see if he needs anything with a biopsy done. Final Clinical Impressions(s) / UC Diagnoses   Final diagnoses:  Cellulitis of chest wall     Discharge Instructions      Take doxycycline  100 mg --1 capsule 2 times daily for 7 days  You can also apply triple antibiotic ointment to the area a couple of times a day. Please do not squeeze the area or scratch it. You can keep it covered with a Band-Aid if that helps.  Please follow-up with your primary care about this issue      ED Prescriptions     Medication Sig Dispense Auth. Provider   doxycycline  (VIBRAMYCIN ) 100 MG capsule Take 1 capsule (100 mg total) by mouth 2 (two) times daily for 7 days. 14 capsule Ellsworth Haas, Kamil Hanigan K, MD      PDMP not reviewed this encounter.   Ann Keto, MD 05/30/23 [phone]

## 2023-06-05 ENCOUNTER — Telehealth: Payer: Self-pay

## 2023-06-05 NOTE — Telephone Encounter (Signed)
 Pharmacy Patient Advocate Encounter   Received notification from CoverMyMeds that prior authorization for Ozempic  is required/requested.   Insurance verification completed.   The patient is insured through [facility] .   Per test claim: PA required; PA submitted to above mentioned insurance via CoverMyMeds Key/confirmation #/EOC [id number] Status is pending

## 2023-06-14 NOTE — Telephone Encounter (Signed)
 Pharmacy Patient Advocate Encounter  Received notification from [employer] that Prior Authorization for Ozempic  has been APPROVED through 01/23/2024   PA #/Case ID/Reference #: [id number]

## 2023-06-15 NOTE — Telephone Encounter (Signed)
 Patient notified

## 2023-06-27 ENCOUNTER — Ambulatory Visit: Admitting: Physician Assistant

## 2023-06-27 NOTE — Telephone Encounter (Signed)
 Reason for CRM: Pt requesting an appointment as soon as possible w/ Amy. Pt states he has something in his ear that is causing discomfort. Soonest available appt is 07/31/23. Pt wanting to get in before then.

## 2023-06-28 ENCOUNTER — Encounter: Payer: Self-pay | Admitting: Physician Assistant

## 2023-06-28 ENCOUNTER — Ambulatory Visit (INDEPENDENT_AMBULATORY_CARE_PROVIDER_SITE_OTHER): Admitting: Physician Assistant

## 2023-06-28 VITALS — BP 128/79 | HR 67 | Wt 206.2 lb

## 2023-06-28 DIAGNOSIS — H938X2 Other specified disorders of left ear: Secondary | ICD-10-CM

## 2023-06-28 NOTE — Telephone Encounter (Signed)
 Pt scheduled

## 2023-06-28 NOTE — Progress Notes (Signed)
 Curtis Saunders, is a 68 y.o. male  ZOX:[id number]  WJX:[id number]  DOB - January 05, 1956  Chief Complaint  Patient presents with   Ear Problem    States he feels something moving in his ears        Subjective:   Curtis Saunders is a 68 y.o. male here today for a sensation of something moving and itching his L ear.  He denies ear pain.  No congestion.  No throat pain  No problems updated.  ALLERGIES: Allergies  Allergen Reactions   Bee Venom Anaphylaxis    PAST MEDICAL HISTORY: Past Medical History:  Diagnosis Date   Diabetes mellitus (HCC)    Hypertension    Stroke (HCC) 2023   x 2, no deficits    MEDICATIONS AT HOME: Prior to Admission medications   Medication Sig Start Date End Date Taking? Authorizing Provider  Accu-Chek FastClix Lancets MISC Apply topically. 03/07/22  Yes [provider]  aspirin  EC 81 MG EC tablet Take 1 tablet (81 mg total) by mouth daily. Swallow whole. 02/28/20  Yes Ghimire, Kuber, MD  blood glucose meter kit and supplies Dispense based on patient and insurance preference. Use up to four times daily as directed. (FOR ICD-10 E10.9, E11.9). 03/02/22  Yes Rai, Ripudeep K, MD  Blood Glucose Monitoring Suppl (ACCU-CHEK AVIVA PLUS) w/Device KIT 1 each by Does not apply route 4 (four) times daily -  before meals and at bedtime. 03/07/22  Yes Rogerio Clay, Amy J, NP  finasteride (PROSCAR) 5 MG tablet Take 5 mg by mouth daily. 11/24/22  Yes [provider]  glucose blood (ACCU-CHEK GUIDE) test strip Use as instructed 03/07/22  Yes Rogerio Clay, Amy J, NP  Lancets Misc. (ACCU-CHEK FASTCLIX LANCET) KIT 1 each by Does not apply route 4 (four) times daily -  before meals and at bedtime. 03/07/22  Yes Rogerio Clay, Amy J, NP  predniSONE  (DELTASONE ) 5 MG tablet Take 5 mg by mouth every morning. 05/01/23  Yes [provider]  ramipril  (ALTACE ) 10 MG capsule Take 1 capsule by mouth once daily 02/20/23  Yes Hochrein, Royston Cornea, MD  rosuvastatin  (CRESTOR ) 20 MG tablet  Take 1 tablet by mouth once daily 04/16/23  Yes Rogerio Clay, Amy J, NP  Semaglutide ,0.25 or 0.5MG /DOS, 2 MG/3ML SOPN Inject 0.5 mg into the skin once a week. 11/28/22  Yes Shamleffer, Ibtehal Jaralla, MD  tamsulosin  (FLOMAX ) 0.4 MG CAPS capsule Take 1 capsule (0.4 mg total) by mouth daily. 03/02/22  Yes Rai, Ripudeep K, MD    ROS: Neg resp Neg cardiac Neg GI Neg GU Neg MS Neg psych Neg neuro  Objective:   Vitals:   06/28/23 0930  BP: 128/79  Pulse: 67  SpO2: 96%  Weight: 206 lb 3.2 oz (93.5 kg)   Exam General appearance : Awake, alert, not in any distress. Speech Clear. Not toxic looking HEENT: Atraumatic and Normocephalic, pupils equally reactive to light and accomodation.  L canal has a long and black coarse hair attached that I was able to pull out with hemastats which immediately resolved his symptoms.  TM WNL.  R TM congested but normal Neurology: Awake alert, and oriented X 3, CN II-XII intact, Non focal Skin: No Rash  Data Review Lab Results  Component Value Date   HGBA1C 5.9 (A) 05/28/2023   HGBA1C 6.1 (A) 11/28/2022   HGBA1C 6.9 (A) 06/16/2022    Assessment & Plan   1. Irritation of left ear (Primary) Resolved after pulling long and coarse hair out.  [facility], CMA  assisted.      Return if symptoms worsen or fail to improve.  The patient was given clear instructions to go to ER or return to medical center if symptoms don't improve, worsen or new problems develop. The patient verbalized understanding. The patient was told to call to get lab results if they haven't heard anything in the next week.      Dulce Gibbs, PA-C [facility] [place], [place] [phone]   06/28/2023, 9:56 AMPatient ID: Curtis Saunders, male   DOB: Jun 08, 1955, 67 y.o.   MRN: [id number]

## 2023-07-28 ENCOUNTER — Other Ambulatory Visit: Payer: Self-pay | Admitting: Family

## 2023-07-28 DIAGNOSIS — E785 Hyperlipidemia, unspecified: Secondary | ICD-10-CM

## 2023-07-30 NOTE — Telephone Encounter (Signed)
 Complete

## 2023-08-01 ENCOUNTER — Encounter: Payer: Self-pay | Admitting: Family Medicine

## 2023-08-01 ENCOUNTER — Ambulatory Visit (INDEPENDENT_AMBULATORY_CARE_PROVIDER_SITE_OTHER): Admitting: Family Medicine

## 2023-08-01 VITALS — BP 162/114 | HR 61 | Ht 72.0 in | Wt 204.0 lb

## 2023-08-01 DIAGNOSIS — I10 Essential (primary) hypertension: Secondary | ICD-10-CM

## 2023-08-01 DIAGNOSIS — M25511 Pain in right shoulder: Secondary | ICD-10-CM

## 2023-08-01 MED ORDER — HYDROCHLOROTHIAZIDE 25 MG PO TABS: 25.0000 mg | ORAL_TABLET | Freq: Every day | ORAL | 0 refills | Status: DC

## 2023-08-01 NOTE — Progress Notes (Signed)
 Established Patient Office Visit  Subjective    Patient ID: Curtis Saunders, male    DOB: 1955-06-12  Age: 68 y.o. MRN: [id number]  CC:  Chief Complaint  Patient presents with   Shoulder Pain    No concerns.     HPI [name] presents for wanting a steroid injection for his chronic shoulder pain.   Outpatient Encounter Medications as of 08/01/2023  Medication Sig   Accu-Chek FastClix Lancets MISC Apply topically.   aspirin  EC 81 MG EC tablet Take 1 tablet (81 mg total) by mouth daily. Swallow whole.   blood glucose meter kit and supplies Dispense based on patient and insurance preference. Use up to four times daily as directed. (FOR ICD-10 E10.9, E11.9).   Blood Glucose Monitoring Suppl (ACCU-CHEK AVIVA PLUS) w/Device KIT 1 each by Does not apply route 4 (four) times daily -  before meals and at bedtime.   finasteride (PROSCAR) 5 MG tablet Take 5 mg by mouth daily.   glucose blood (ACCU-CHEK GUIDE) test strip Use as instructed   hydrochlorothiazide  (HYDRODIURIL ) 25 MG tablet Take 1 tablet (25 mg total) by mouth daily.   Lancets Misc. (ACCU-CHEK FASTCLIX LANCET) KIT 1 each by Does not apply route 4 (four) times daily -  before meals and at bedtime.   predniSONE  (DELTASONE ) 5 MG tablet Take 5 mg by mouth every morning.   ramipril  (ALTACE ) 10 MG capsule Take 1 capsule by mouth once daily   rosuvastatin  (CRESTOR ) 20 MG tablet Take 1 tablet by mouth once daily   Semaglutide ,0.25 or 0.5MG /DOS, 2 MG/3ML SOPN Inject 0.5 mg into the skin once a week.   tamsulosin  (FLOMAX ) 0.4 MG CAPS capsule Take 1 capsule (0.4 mg total) by mouth daily.   No facility-administered encounter medications on file as of 08/01/2023.    Past Medical History:  Diagnosis Date   Diabetes mellitus (HCC)    Hypertension    Stroke (HCC) 2023   x 2, no deficits    Past Surgical History:  Procedure Laterality Date   APPENDECTOMY     EYE SURGERY     HAND SURGERY     KNEE SURGERY     x3, x1 R    LIPOMA EXCISION Left 10/13/2019   Procedure: SHOULDER EXCISION LIPOMA AND ROTATOR CUFF REPAIR;  Surgeon: Dozier Soulier, MD;  Location: [facility];  Service: Orthopedics;  Laterality: Left;   TONSILLECTOMY      Family History  Problem Relation Age of Onset   Heart attack Mother 95   COPD Mother    Emphysema Mother    Diabetes Father    Diabetes Sister    Hypertension Sister    Sleep apnea Neg Hx     Social History   Socioeconomic History   Marital status: Significant Other    Spouse name: Not on file   Number of children: Not on file   Years of education: Not on file   Highest education level: Not on file  Occupational History   Occupation: retired  Tobacco Use   Smoking status: Never    Passive exposure: Never   Smokeless tobacco: Never  Vaping Use   Vaping status: Never Used  Substance and Sexual Activity   Alcohol use: Yes    Comment: occasional   Drug use: No   Sexual activity: Yes  Other Topics Concern   Not on file  Social History Narrative   ** Merged History Encounter **  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     Social Drivers of Corporate investment banker Strain: Not on file  Food Insecurity: No Food Insecurity (03/01/2022)   Hunger Vital Sign    Worried About Running Out of Food in the Last Year: Never true    Ran Out of Food in the Last Year: Never true  Transportation Needs: No Transportation Needs (03/01/2022)   PRAPARE - Administrator, Civil Service (Medical): No    Lack of Transportation (Non-Medical): No  Physical Activity: Not on file  Stress: Not on file  Social Connections: Not on file  Intimate Partner Violence: Not At Risk (03/01/2022)   Humiliation, Afraid, Rape, and Kick questionnaire    Fear of Current or Ex-Partner: No    Emotionally Abused: No    Physically Abused: No    Sexually Abused: No    Review of Systems  All other systems reviewed and are negative.       Objective    BP (!)  162/114   Pulse 61   Ht 6' (1.829 m)   Wt 204 lb (92.5 kg)   SpO2 97%   BMI 27.67 kg/m   Physical Exam Vitals and nursing note reviewed.  Constitutional:      General: He is not in acute distress. Cardiovascular:     Rate and Rhythm: Normal rate and regular rhythm.  Pulmonary:     Effort: Pulmonary effort is normal.     Breath sounds: Normal breath sounds.  Abdominal:     Palpations: Abdomen is soft.     Tenderness: There is no abdominal tenderness.  Musculoskeletal:     Right shoulder: Tenderness present. No swelling or deformity. Decreased range of motion.  Neurological:     General: No focal deficit present.     Mental Status: He is alert and oriented to person, place, and time.         Assessment & Plan:  1. Uncontrolled hypertension (Primary) Elevated reading. Will add hydrochlorothiazide  to regimen  2. Right shoulder pain, unspecified chronicity Deferred steroid 2/2 elevated blood pressure    Return in about 2 weeks (around 08/15/2023) for follow up.   Tanda Raguel SQUIBB, MD

## 2023-08-08 ENCOUNTER — Ambulatory Visit (INDEPENDENT_AMBULATORY_CARE_PROVIDER_SITE_OTHER): Admitting: Family

## 2023-08-08 ENCOUNTER — Encounter: Payer: Self-pay | Admitting: Family

## 2023-08-08 VITALS — BP 134/89 | HR 61 | Temp 97.6°F | Resp 16 | Ht 66.0 in | Wt 200.6 lb

## 2023-08-08 DIAGNOSIS — G8929 Other chronic pain: Secondary | ICD-10-CM | POA: Diagnosis not present

## 2023-08-08 DIAGNOSIS — M25511 Pain in right shoulder: Secondary | ICD-10-CM | POA: Diagnosis not present

## 2023-08-08 MED ORDER — TRIAMCINOLONE ACETONIDE 40 MG/ML IJ SUSP
40.0000 mg | Freq: Once | INTRAMUSCULAR | Status: AC
  Administered 2023-08-08: 40 mg via INTRAMUSCULAR

## 2023-08-08 NOTE — Progress Notes (Signed)
 Patient ID: Curtis Saunders, male    DOB: [birth year]  MRN: [id number]  CC: Shoulder Pain  Subjective: Curtis Saunders is a 68 y.o. male who presents for shoulder pain.  His concerns today include:  Right shoulder pain persisting. Denies recent trauma/injury and red flag symptoms. States she wants an injection because it helped in the past. Reports he did not establish with Orthopedics since previous office visit.  Patient Active Problem List   Diagnosis Date Noted   Secondary adrenal insufficiency (HCC) 11/28/2022   Diabetes mellitus (HCC) 06/16/2022   Low serum cortisol level 06/16/2022   Benign essential HTN 03/11/2022   Uncontrolled type 2 diabetes mellitus with hyperglycemia, with long-term current use of insulin  (HCC) 03/11/2022   Acute urinary retention 03/11/2022   Obesity (BMI 30-39.9) 03/11/2022   Acute retention of urine 03/11/2022   Acute kidney injury superimposed on chronic kidney disease (HCC) 03/01/2022   New onset type 2 diabetes mellitus (HCC) 02/28/2022   BPH (benign prostatic hyperplasia) 02/28/2022   Pain in joint of right shoulder 01/09/2022   Partial thickness rotator cuff tear 01/09/2022   Osteoarthritis of right knee 11/08/2021   Pain in joint of right knee 11/08/2021   Low back pain 07/14/2021   Pain in joint of left shoulder 08/12/2020   Stage 3 chronic kidney disease (HCC) 04/12/2020   CVA (cerebral vascular accident) (HCC) 02/27/2020   Ascending aortic aneurysm (HCC) 02/11/2020   Double pterygium, 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     aspirin  EC 81 MG EC tablet Take 1 tablet (81 mg total) by mouth daily. Swallow whole. 30 tablet 11   blood glucose meter kit and  supplies Dispense based on patient and insurance preference. Use up to four times daily as directed. (FOR ICD-10 E10.9, E11.9). 1 each 0   Blood Glucose Monitoring Suppl (ACCU-CHEK AVIVA PLUS) w/Device KIT 1 each by Does not apply route 4 (four) times daily -  before meals and at bedtime. 1 kit 0   finasteride (PROSCAR) 5 MG tablet Take 5 mg by mouth daily.     glucose blood (ACCU-CHEK GUIDE) test strip Use as instructed 100 each 2   hydrochlorothiazide  (HYDRODIURIL ) 25 MG tablet Take 1 tablet (25 mg total) by mouth daily. 90 tablet 0   Lancets Misc. (ACCU-CHEK FASTCLIX LANCET) KIT 1 each by Does not apply route 4 (four) times daily -  before meals and at bedtime. 1 kit 2   predniSONE  (DELTASONE ) 5 MG tablet Take 5 mg by mouth every morning.     ramipril  (ALTACE ) 10 MG capsule Take 1 capsule by mouth once daily 90 capsule 1   rosuvastatin  (CRESTOR ) 20 MG tablet Take 1 tablet by mouth once daily 90 tablet 0   Semaglutide ,0.25 or 0.5MG /DOS, 2 MG/3ML SOPN Inject 0.5 mg into the skin once a week. 9 mL 2   tamsulosin  (FLOMAX ) 0.4 MG CAPS capsule Take 1 capsule (0.4 mg total) by mouth daily. 30 capsule 3   No current facility-administered medications on file prior to visit.    Allergies  Allergen Reactions   Bee Venom Anaphylaxis    Social History   Socioeconomic History   Marital status: Significant Other    Spouse name: Not on file  Number of children: Not on file   Years of education: Not on file   Highest education level: Not on file  Occupational History   Occupation: retired  Tobacco Use   Smoking status: Never    Passive exposure: Never   Smokeless tobacco: Never  Vaping Use   Vaping status: Never Used  Substance and Sexual Activity   Alcohol use: Yes    Comment: occasional   Drug use: No   Sexual activity: Yes  Other Topics Concern   Not on file  Social History Narrative   ** Merged History Encounter **       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     Social Drivers  of Corporate investment banker Strain: Low Risk  (08/08/2023)   Overall Financial Resource Strain (CARDIA)    Difficulty of Paying Living Expenses: Not hard at all  Food Insecurity: No Food Insecurity (08/08/2023)   Hunger Vital Sign    Worried About Running Out of Food in the Last Year: Never true    Ran Out of Food in the Last Year: Never true  Transportation Needs: No Transportation Needs (08/08/2023)   PRAPARE - Administrator, Civil Service (Medical): No    Lack of Transportation (Non-Medical): No  Physical Activity: Insufficiently Active (08/08/2023)   Exercise Vital Sign    Days of Exercise per Week: 3 days    Minutes of Exercise per Session: 30 min  Stress: No Stress Concern Present (08/08/2023)   [employer] of Occupational Health - Occupational Stress Questionnaire    Feeling of Stress: Not at all  Social Connections: Socially Isolated (08/08/2023)   Social Connection and Isolation Panel    Frequency of Communication with Friends and Family: Twice a week    Frequency of Social Gatherings with Friends and Family: Once a week    Attends Religious Services: Never    Database administrator or Organizations: No    Attends Banker Meetings: Never    Marital Status: Never married  Intimate Partner Violence: Not At Risk (08/08/2023)   Humiliation, Afraid, Rape, and Kick questionnaire    Fear of Current or Ex-Partner: No    Emotionally Abused: No    Physically Abused: No    Sexually Abused: No    Family History  Problem Relation Age of Onset   Heart attack Mother 57   COPD Mother    Emphysema Mother    Diabetes Father    Diabetes Sister    Hypertension Sister    Sleep apnea Neg Hx     Past Surgical History:  Procedure Laterality Date   APPENDECTOMY     EYE SURGERY     HAND SURGERY     KNEE SURGERY     x3, x1 R   LIPOMA EXCISION Left 10/13/2019   Procedure: SHOULDER EXCISION LIPOMA AND ROTATOR CUFF REPAIR;  Surgeon: Dozier Soulier, MD;   Location: [facility];  Service: Orthopedics;  Laterality: Left;   TONSILLECTOMY      ROS: Review of Systems Negative except as stated above  PHYSICAL EXAM: BP 134/89   Pulse 61   Temp 97.6 F (36.4 C) (Oral)   Resp 16   Ht 5' 6 (1.676 m)   Wt 200 lb 9.6 oz (91 kg)   SpO2 96%   BMI 32.38 kg/m   Physical Exam HENT:     Head: Normocephalic and atraumatic.     Nose: Nose normal.  Mouth/Throat:     Mouth: Mucous membranes are moist.     Pharynx: Oropharynx is clear.  Eyes:     Extraocular Movements: Extraocular movements intact.     Conjunctiva/sclera: Conjunctivae normal.     Pupils: Pupils are equal, round, and reactive to light.  Cardiovascular:     Rate and Rhythm: Normal rate and regular rhythm.     Pulses: Normal pulses.     Heart sounds: Normal heart sounds.  Pulmonary:     Effort: Pulmonary effort is normal.     Breath sounds: Normal breath sounds.  Musculoskeletal:     Right shoulder: Decreased range of motion.     Left shoulder: Normal.     Right upper arm: Normal.     Left upper arm: Normal.     Right elbow: Normal.     Left elbow: Normal.     Right forearm: Normal.     Left forearm: Normal.     Right wrist: Normal.     Left wrist: Normal.     Right hand: Normal.     Left hand: Normal.     Cervical back: Normal range of motion and neck supple.  Neurological:     General: No focal deficit present.     Mental Status: He is alert and oriented to person, place, and time.  Psychiatric:        Mood and Affect: Mood normal.        Behavior: Behavior normal.     ASSESSMENT AND PLAN: 1. Chronic right shoulder pain (Primary) - Triamcinolone  Acetonide injection administered in office. - Referral to Orthopedic Surgery for evaluation/management. - Follow-up with primary provider as scheduled. - triamcinolone  acetonide (KENALOG -40) injection 40 mg - Ambulatory referral to Orthopedic Surgery  Patient was given the opportunity to ask  questions.  Patient verbalized understanding of the plan and was able to repeat key elements of the plan. Patient was given clear instructions to go to Emergency Department or return to medical center if symptoms don't improve, worsen, or new problems develop.The patient verbalized understanding.   Orders Placed This Encounter  Procedures   Ambulatory referral to Orthopedic Surgery    Follow-up with primary provider as scheduled.  Greig JINNY Drones, NP

## 2023-08-08 NOTE — Progress Notes (Signed)
 Right shoulder pain, patient wants a shot

## 2023-08-26 NOTE — Progress Notes (Unsigned)
 Cardiology Office Note:   Date:  08/27/2023  ID:  Curtis Saunders, DOB [birth year], MRN [id number] PCP: Lorren Greig PARAS, NP  [facility] Providers Cardiologist:  Lynwood Schilling, MD {  History of Present Illness:   Curtis Saunders is a 68 y.o. male who was referred by ED for evaluation of chest pain.  He was in the ED in late Jan 2021 for chest pain.    There was no evidence objective evidence of ischemia.   The patient had moderate LVH on echo.  He was noted there to have negative cardiac enzymes.  CT of the chest demonstrated no acute abnormalities.  Apparently because he had headaches he also had a CT of his head which demonstrated no acute abnormalities.  He was treated with morphine , ketorolac  and nitroglycerin .    After seeing me I sent him for a coronary CTA and he had no coronary calcium  and NL coronaries.  He does have an ascending aortic aneurysm.   In Feb 2022 he was hospitalized with acute CVA.  There was an MRI with posterior limb internal capsule acute infarct.  Echo demonstrated severe LVH.  He was treated with ASA and Plavix .    MRI of his heart did not suggest infiltrative disease.  On the MRI the LVH was borderline there was no mention of aortic root dilatation   Since he was last seen I do note that he saw his primary provider and he was hypertensive so about a month ago he started HCTZ in addition to his other meds.  He says his blood pressure has been in the 120s systolic at home.  He has felt well.  He is doing some little bit of walking and he does his yard work including cutting grass.  He has a [place] husky named O.  He says the dog does not like to go outside so he does not get to walk him.  The patient denies any new symptoms such as chest discomfort, neck or arm discomfort. There has been no new shortness of breath, PND or orthopnea. There have been no reported palpitations, presyncope or syncope.   ROS: As stated in the HPI and negative for all other  systems.  Studies Reviewed:    EKG:     NA    Risk Assessment/Calculations:              Physical Exam:   VS:  BP 116/76   Pulse 66   Ht 5' 6 (1.676 m)   Wt 201 lb 6.4 oz (91.4 kg)   SpO2 98%   BMI 32.51 kg/m    Wt Readings from Last 3 Encounters:  08/27/23 201 lb 6.4 oz (91.4 kg)  08/08/23 200 lb 9.6 oz (91 kg)  08/01/23 204 lb (92.5 kg)     GEN: Well nourished, well developed in no acute distress NECK: No JVD; No carotid bruits CARDIAC: RRR, n murmurs, rubs, gallops RESPIRATORY:  Clear to auscultation without rales, wheezing or rhonchi  ABDOMEN: Soft, non-tender, non-distended EXTREMITIES:  No edema; No deformity   ASSESSMENT AND PLAN:   HTN:    Blood pressure is well-controlled as above. No change in therapy.    LVH:    MRI did not suggest infiltrative disease.  There was borderline LVH on this MRI in 2022.   He was to get a repeat echo but I do not see that this happened in the last year.  It was ordered.     I will  check an echo as below.   AORTIC ANEURYSM:  This was 4.0 cm on CT.   This was not prominent on MRI.   I would like to follow this up but would like to avoid contrast so I will just do an echocardiogram to evaluate his LVH as well as measure aortic root size.   CKD II:   Creatinine was 1.59 most recently 3 months ago.  This is much improved compared to a peak of 2.21.   I would check a basic metabolic profile today but he has follow-up with his nephrologist soon.  I will defer follow-up to them.    Follow up with me in 1 year.  Signed, Lynwood Schilling, MD

## 2023-08-27 ENCOUNTER — Ambulatory Visit: Attending: Cardiology | Admitting: Cardiology

## 2023-08-27 ENCOUNTER — Encounter: Payer: Self-pay | Admitting: Cardiology

## 2023-08-27 VITALS — BP 116/76 | HR 66 | Ht 66.0 in | Wt 201.4 lb

## 2023-08-27 DIAGNOSIS — I517 Cardiomegaly: Secondary | ICD-10-CM

## 2023-08-27 DIAGNOSIS — I7789 Other specified disorders of arteries and arterioles: Secondary | ICD-10-CM | POA: Diagnosis not present

## 2023-08-27 NOTE — Patient Instructions (Signed)
 Medication Instructions:  Your physician recommends that you continue on your current medications as directed. Please refer to the Current Medication list given to you today.  *If you need a refill on your cardiac medications before your next appointment, please call your pharmacy*  Lab Work: NONE If you have labs (blood work) drawn today and your tests are completely normal, you will receive your results only by: MyChart Message (if you have MyChart) OR A paper copy in the mail If you have any lab test that is abnormal or we need to change your treatment, we will call you to review the results.  Testing/Procedures: Echocardiogram Your physician has requested that you have an echocardiogram. Echocardiography is a painless test that uses sound waves to create images of your heart. It provides your doctor with information about the size and shape of your heart and how well your heart's chambers and valves are working. This procedure takes approximately one hour. There are no restrictions for this procedure. Please do NOT wear cologne, perfume, aftershave, or lotions (deodorant is allowed). Please arrive 15 minutes prior to your appointment time.  Please note: We ask at that you not bring children with you during ultrasound (echo/ vascular) testing. Due to room size and safety concerns, children are not allowed in the ultrasound rooms during exams. Our front office staff cannot provide observation of children in our lobby area while testing is being conducted. An adult accompanying a patient to their appointment will only be allowed in the ultrasound room at the discretion of the ultrasound technician under special circumstances. We apologize for any inconvenience.   Follow-Up: At [facility], you and your health needs are our priority.  As part of our continuing mission to provide you with exceptional heart care, our providers are all part of one team.  This team includes your primary  Cardiologist (physician) and Advanced Practice Providers or APPs (Physician Assistants and Nurse Practitioners) who all work together to provide you with the care you need, when you need it.  Your next appointment:   1 year(s)  Provider:   Eilleen Grates, MD    We recommend signing up for the patient portal called MyChart.  Sign up information is provided on this After Visit Summary.  MyChart is used to connect with patients for Virtual Visits (Telemedicine).  Patients are able to view lab/test results, encounter notes, upcoming appointments, etc.  Non-urgent messages can be sent to your provider as well.   To learn more about what you can do with MyChart, go to [email or web address].

## 2023-09-08 ENCOUNTER — Other Ambulatory Visit: Payer: Self-pay

## 2023-09-08 ENCOUNTER — Emergency Department (HOSPITAL_COMMUNITY): Admission: EM | Admit: 2023-09-08 | Discharge: 2023-09-08 | Disposition: A | Discharge status: Home / Self Care

## 2023-09-08 ENCOUNTER — Encounter (HOSPITAL_COMMUNITY): Payer: Self-pay | Admitting: Pharmacy Technician

## 2023-09-08 DIAGNOSIS — Z7982 Long term (current) use of aspirin: Secondary | ICD-10-CM | POA: Insufficient documentation

## 2023-09-08 DIAGNOSIS — M62838 Other muscle spasm: Secondary | ICD-10-CM | POA: Insufficient documentation

## 2023-09-08 DIAGNOSIS — M542 Cervicalgia: Secondary | ICD-10-CM | POA: Diagnosis present

## 2023-09-08 MED ORDER — METHOCARBAMOL 500 MG PO TABS
1000.0000 mg | ORAL_TABLET | Freq: Once | ORAL | Status: AC
  Administered 2023-09-08: 1000 mg via ORAL
  Filled 2023-09-08: qty 2

## 2023-09-08 MED ORDER — METHYLPREDNISOLONE SODIUM SUCC 125 MG IJ SOLR
125.0000 mg | Freq: Once | INTRAMUSCULAR | Status: AC
  Administered 2023-09-08: 125 mg via INTRAMUSCULAR
  Filled 2023-09-08: qty 2

## 2023-09-08 MED ORDER — KETOROLAC TROMETHAMINE 15 MG/ML IJ SOLN
15.0000 mg | Freq: Once | INTRAMUSCULAR | Status: AC
  Administered 2023-09-08: 15 mg via INTRAMUSCULAR
  Filled 2023-09-08: qty 1

## 2023-09-08 MED ORDER — METHOCARBAMOL 500 MG PO TABS: 1000.0000 mg | ORAL_TABLET | Freq: Four times a day (QID) | ORAL | 0 refills | Status: AC | PRN

## 2023-09-08 MED ORDER — NAPROXEN 500 MG PO TABS: 500.0000 mg | ORAL_TABLET | Freq: Two times a day (BID) | ORAL | 0 refills | Status: AC

## 2023-09-08 NOTE — ED Provider Notes (Signed)
 [place] EMERGENCY DEPARTMENT AT [facility] Provider Note   CSN: [id number] Arrival date & time: 09/08/23  9064     Patient presents with: Neck Pain   Curtis Saunders is a 68 y.o. male.   68 year old male here for neck pain.  States it started when he woke up today.  States he is having a hard time turning his neck, side and he has pain over the right side of his neck.  Denies any falls or injuries.   Neck Pain Associated symptoms: no chest pain and no fever        Prior to Admission medications   Medication Sig Start Date End Date Taking? Authorizing Provider  methocarbamol  (ROBAXIN ) 500 MG tablet Take 2 tablets (1,000 mg total) by mouth every 6 (six) hours as needed for muscle spasms. 09/08/23  Yes Bane Hagy L, DO  naproxen  (NAPROSYN ) 500 MG tablet Take 1 tablet (500 mg total) by mouth 2 (two) times daily for 7 days. 09/08/23 09/15/23 Yes Briggette Najarian L, DO  Accu-Chek FastClix Lancets MISC Apply topically. 03/07/22   [provider]  aspirin  EC 81 MG EC tablet Take 1 tablet (81 mg total) by mouth daily. Swallow whole. 02/28/20   Ghimire, Kuber, MD  blood glucose meter kit and supplies Dispense based on patient and insurance preference. Use up to four times daily as directed. (FOR ICD-10 E10.9, E11.9). 03/02/22   Rai, Ripudeep K, MD  Blood Glucose Monitoring Suppl (ACCU-CHEK AVIVA PLUS) w/Device KIT 1 each by Does not apply route 4 (four) times daily -  before meals and at bedtime. 03/07/22   Lorren Greig PARAS, NP  finasteride (PROSCAR) 5 MG tablet Take 5 mg by mouth daily. 11/24/22   [provider]  glucose blood (ACCU-CHEK GUIDE) test strip Use as instructed 03/07/22   Lorren Greig PARAS, NP  hydrochlorothiazide  (HYDRODIURIL ) 25 MG tablet Take 1 tablet (25 mg total) by mouth daily. Patient not taking: Reported on 08/27/2023 08/01/23   Tanda Bleacher, MD  Lancets Misc. (ACCU-CHEK FASTCLIX LANCET) KIT 1 each by Does not apply route 4 (four) times daily  -  before meals and at bedtime. 03/07/22   Lorren Greig PARAS, NP  predniSONE  (DELTASONE ) 5 MG tablet Take 5 mg by mouth every morning. 05/01/23   [provider]  ramipril  (ALTACE ) 10 MG capsule Take 1 capsule by mouth once daily 02/20/23   Lavona Agent, MD  rosuvastatin  (CRESTOR ) 20 MG tablet Take 1 tablet by mouth once daily 07/30/23   Lorren Greig PARAS, NP  Semaglutide ,0.25 or 0.5MG /DOS, 2 MG/3ML SOPN Inject 0.5 mg into the skin once a week. 11/28/22   Shamleffer, Ibtehal Jaralla, MD  tamsulosin  (FLOMAX ) 0.4 MG CAPS capsule Take 1 capsule (0.4 mg total) by mouth daily. 03/02/22   Rai, Nydia POUR, MD    Allergies: Bee venom    Review of Systems  Constitutional:  Negative for chills and fever.  HENT:  Negative for ear pain and sore throat.   Eyes:  Negative for pain and visual disturbance.  Respiratory:  Negative for cough and shortness of breath.   Cardiovascular:  Negative for chest pain and palpitations.  Gastrointestinal:  Negative for abdominal pain and vomiting.  Genitourinary:  Negative for dysuria and hematuria.  Musculoskeletal:  Positive for neck pain. Negative for arthralgias and back pain.  Skin:  Negative for color change and rash.  Neurological:  Negative for seizures and syncope.  All other systems reviewed and are negative.   Updated  Vital Signs BP 116/87 (BP Location: Left Arm)   Pulse 61   Temp 97.7 F (36.5 C) (Oral)   Resp 18   SpO2 99%   Physical Exam Vitals and nursing note reviewed.  Constitutional:      General: He is not in acute distress.    Appearance: Normal appearance. He is well-developed. He is not ill-appearing.  HENT:     Head: Normocephalic and atraumatic.  Eyes:     Conjunctiva/sclera: Conjunctivae normal.  Cardiovascular:     Rate and Rhythm: Normal rate and regular rhythm.     Heart sounds: No murmur heard. Pulmonary:     Effort: Pulmonary effort is normal. No respiratory distress.     Breath sounds: Normal breath sounds.  Abdominal:      Palpations: Abdomen is soft.     Tenderness: There is no abdominal tenderness.  Musculoskeletal:        General: Tenderness present. No swelling.     Cervical back: Neck supple.     Comments: There is tenderness to palpation hypertonicity over the right cervical musculature  Skin:    General: Skin is warm and dry.     Capillary Refill: Capillary refill takes less than 2 seconds.  Neurological:     Mental Status: He is alert.  Psychiatric:        Mood and Affect: Mood normal.     (all labs ordered are listed, but only abnormal results are displayed) Labs Reviewed - No data to display  EKG: None  Radiology: No results found.   Procedures   Medications Ordered in the ED  methylPREDNISolone  sodium succinate (SOLU-MEDROL ) 125 mg/2 mL injection 125 mg (has no administration in time range)  ketorolac  (TORADOL ) 15 MG/ML injection 15 mg (has no administration in time range)  methocarbamol  (ROBAXIN ) tablet 1,000 mg (has no administration in time range)                                    Medical Decision Making Patient here for muscle spasm of neck.  Will give him Toradol  steroids and a dose of Robaxin  here.  Will give prescription for Robaxin  and naproxen .  Advised heat as needed and Tylenol  as needed for pain.  Advised to follow-up with primary cardiologist in the ER for any worsening symptoms.  Feels comfortable being discharged home.  Problems Addressed: Muscle spasms of neck: acute illness or injury  Amount and/or Complexity of Data Reviewed External Data Reviewed: notes.    Details: Previous urgent care records reviewed and patient seen in May 2025 for chest wall cellulitis  Risk OTC drugs. Prescription drug management.     Final diagnoses:  Muscle spasms of neck    ED Discharge Orders          Ordered    methocarbamol  (ROBAXIN ) 500 MG tablet  Every 6 hours PRN        09/08/23 1105    naproxen  (NAPROSYN ) 500 MG tablet  2 times daily        09/08/23 1105                Dannelle Rhymes, Chamois L, DO 09/08/23 1106

## 2023-09-08 NOTE — ED Triage Notes (Addendum)
 Pt here POV with R sided neck pain onset Thursday. States pain is intermittent and sharp. Denies headache, blurred vision or dizziness.

## 2023-09-08 NOTE — Discharge Instructions (Addendum)
 Take the naproxen  as prescribed and Robaxin  as needed.  You can use heat and lidocaine  patches as needed for pain.

## 2023-09-10 LAB — LAB REPORT - SCANNED: EGFR: 36

## 2023-09-13 ENCOUNTER — Ambulatory Visit (INDEPENDENT_AMBULATORY_CARE_PROVIDER_SITE_OTHER): Admitting: Internal Medicine

## 2023-09-13 ENCOUNTER — Encounter: Payer: Self-pay | Admitting: Internal Medicine

## 2023-09-13 VITALS — BP 130/88 | HR 60 | Resp 20 | Ht 66.0 in | Wt 207.6 lb

## 2023-09-13 DIAGNOSIS — E1122 Type 2 diabetes mellitus with diabetic chronic kidney disease: Secondary | ICD-10-CM | POA: Diagnosis not present

## 2023-09-13 DIAGNOSIS — N1832 Chronic kidney disease, stage 3b: Secondary | ICD-10-CM

## 2023-09-13 DIAGNOSIS — E2749 Other adrenocortical insufficiency: Secondary | ICD-10-CM

## 2023-09-13 DIAGNOSIS — E1159 Type 2 diabetes mellitus with other circulatory complications: Secondary | ICD-10-CM | POA: Diagnosis not present

## 2023-09-13 DIAGNOSIS — Z794 Long term (current) use of insulin: Secondary | ICD-10-CM | POA: Diagnosis not present

## 2023-09-13 LAB — POCT GLYCOSYLATED HEMOGLOBIN (HGB A1C): Hemoglobin A1C: 6.2 % — AB (ref 4.0–5.6)

## 2023-09-13 LAB — GLUCOSE, POCT (MANUAL RESULT ENTRY): POC Glucose: 70 mg/dL (ref 70–99)

## 2023-09-13 NOTE — Progress Notes (Signed)
 Name: Curtis Saunders  MRN/ DOB: [id number], 03-09-1955   Age/ Sex: 68 y.o., male    PCP: Lorren Greig PARAS, NP   Reason for Endocrinology Evaluation: Type 2 Diabetes Mellitus     Date of Initial Endocrinology Visit: 06/16/2022    PATIENT IDENTIFIER: Curtis Saunders is a 68 y.o. male with a past medical history of DM, HTN, CVA. The patient presented for initial endocrinology clinic visit on 06/16/2022 for consultative assistance with his diabetes management.    HPI: Curtis Saunders was    Diagnosed with DM 06/2021, pt presented to the ED with severe hyperglycemia >600 mg/dL in 07/7973, he was discharged on insulin  mix , he did have DKA at the time. Prior Medications tried/Intolerance: levemir            Hemoglobin A1c has ranged from 7.3% in 2023, peaking at 14.4% in 2024.  On his initial visit to our clinic he had an A1c of 6.9%, was on Levemir  only, started Ozempic      Adrenal HISTORY:  Patient had an abnormal cosyntropin  stimulation test 03/15/2022 with a baseline cortisol of 8.5 UG/DL, with a 39-[id number] cortisol at 17.1 UG/DL. He was hospitalized for constipation at the time  with decrease urine output, was noted with hypokalemia , serum cortisol was 2.8  Brain MRI and adrenal imagine negative    Repeat cosyntropin  stimulation test was abnormal 07/05/2022 with a 60-minute cortisol of 12.9 UG/DL ACTH  at the lower limit of normal of 6 PG/mL (reference 6-50)  Due to miscommunication, the patient did not start prednisone  as initially advised in June, and was started on hydrocortisone  11/2022   By May, 2025 I contacted his [facility] pharmacy and they verified that the patient has received# 100 days of hydrocortisone  in November, 2024 and March, 2025 as well as prednisone  in June, 2024 August, 2024 and April, 2025.  I did discontinue hydrocortisone  the time and continue prednisone    SUBJECTIVE:   During the last visit (05/28/2023): A1c 5.9%    Today (09/13/23): Curtis  Niquan Saunders is here for follow-up on diabetes management and adrenal insufficiency.  He checks his blood sugars 1x daily . The patient has not had hypoglycemic episodes since the last clinic visit.   Patient continues to follow-up with cardiology for LVH, MRI did not suggest infiltrative disease, he also has 4 cm aortic aneurysm on CT   Denies nausea or vomiting  Denies constipation or diarrhea  Continues with dizziness    HOME ENDOCRINE REGIMEN: Ozempic  0.25 mg weekly Prednisone  5 mg daily   Statin: Yes ACE-I/ARB: yes   METER DOWNLOAD SUMMARYn/a 75-151 mg/dL  DIABETIC COMPLICATIONS: Microvascular complications:  CKD III Denies:  Last eye exam: Completed yrs ago   Macrovascular complications:  CVA Denies: CAD, PVD   PAST HISTORY: Past Medical History:  Past Medical History:  Diagnosis Date   Diabetes mellitus (HCC)    Hypertension    Stroke (HCC) 2023   x 2, no deficits   Past Surgical History:  Past Surgical History:  Procedure Laterality Date   APPENDECTOMY     EYE SURGERY     HAND SURGERY     KNEE SURGERY     x3, x1 R   LIPOMA EXCISION Left 10/13/2019   Procedure: SHOULDER EXCISION LIPOMA AND ROTATOR CUFF REPAIR;  Surgeon: Dozier Soulier, MD;  Location: [facility];  Service: Orthopedics;  Laterality: Left;   TONSILLECTOMY      Social History:  reports that he has never smoked.  He has never been exposed to tobacco smoke. He has never used smokeless tobacco. He reports current alcohol use. He reports that he does not use drugs. Family History:  Family History  Problem Relation Age of Onset   Heart attack Mother 61   COPD Mother    Emphysema Mother    Diabetes Father    Diabetes Sister    Hypertension Sister    Sleep apnea Neg Hx      HOME MEDICATIONS: Allergies as of 09/13/2023       Reactions   Bee Venom Anaphylaxis        Medication List        Accurate as of September 13, 2023  9:24 AM. If you have any questions,  ask your nurse or doctor.          Accu-Chek Aviva Plus w/Device Kit 1 each by Does not apply route 4 (four) times daily -  before meals and at bedtime.   Accu-Chek [employer] Kit 1 each by Does not apply route 4 (four) times daily -  before meals and at bedtime.   Accu-Chek FastClix Lancets Misc Apply topically.   Accu-Chek Guide test strip Generic drug: glucose blood Use as instructed   aspirin  EC 81 MG tablet Take 1 tablet (81 mg total) by mouth daily. Swallow whole.   blood glucose meter kit and supplies Dispense based on patient and insurance preference. Use up to four times daily as directed. (FOR ICD-10 E10.9, E11.9).   finasteride 5 MG tablet Commonly known as: PROSCAR Take 5 mg by mouth daily.   hydrochlorothiazide  25 MG tablet Commonly known as: HYDRODIURIL  Take 1 tablet (25 mg total) by mouth daily.   methocarbamol  500 MG tablet Commonly known as: ROBAXIN  Take 2 tablets (1,000 mg total) by mouth every 6 (six) hours as needed for muscle spasms.   naproxen  500 MG tablet Commonly known as: NAPROSYN  Take 1 tablet (500 mg total) by mouth 2 (two) times daily for 7 days.   predniSONE  5 MG tablet Commonly known as: DELTASONE  Take 5 mg by mouth every morning.   ramipril  10 MG capsule Commonly known as: ALTACE  Take 1 capsule by mouth once daily   rosuvastatin  20 MG tablet Commonly known as: CRESTOR  Take 1 tablet by mouth once daily   Semaglutide (0.25 or 0.5MG /DOS) 2 MG/3ML Sopn Inject 0.5 mg into the skin once a week.   tamsulosin  0.4 MG Caps capsule Commonly known as: FLOMAX  Take 1 capsule (0.4 mg total) by mouth daily.         ALLERGIES: Allergies  Allergen Reactions   Bee Venom Anaphylaxis     REVIEW OF SYSTEMS: A comprehensive ROS was conducted with the patient and is negative except as per HPI    OBJECTIVE:   VITAL SIGNS: BP 130/88   Pulse 60   Resp 20   Ht 5' 6 (1.676 m)   Wt 207 lb 9.6 oz (94.2 kg)   SpO2 96%   BMI 33.51  kg/m      Body surface area is 2.09 meters squared.   PHYSICAL EXAM:  General: Pt appears well and is in NAD  Lungs: Clear with good BS bilat   Heart: RRR   Extremities:  Lower extremities - No pretibial edema.   Neuro: MS is good with appropriate affect, pt is alert and Ox3    DM foot exam: 05/28/2023  The skin of the feet is without sores or ulcerations, with thickened and discolored nails  The  pedal pulses are 2+ on right and 2+ on left. The sensation is intact to a screening 5.07, 10 gram monofilament bilaterally   DATA REVIEWED:  Lab Results  Component Value Date   HGBA1C 6.2 (A) 09/13/2023   HGBA1C 5.9 (A) 05/28/2023   HGBA1C 6.1 (A) 11/28/2022    Latest Reference Range & Units 05/28/23 14:16  Sodium 135 - 146 mmol/L 143  Potassium 3.5 - 5.3 mmol/L 4.1  Chloride 98 - 110 mmol/L 108  CO2 20 - 32 mmol/L 28  Glucose 65 - 99 mg/dL 88  BUN 7 - 25 mg/dL 16  Creatinine 9.29 - 8.64 mg/dL 8.40 (H)  Calcium  8.6 - 10.3 mg/dL 9.6  BUN/Creatinine Ratio 6 - 22 (calc) 10  eGFR > OR = 60 mL/min/1.28m2 48 (L)  Total CHOL/HDL Ratio <5.0 (calc) 2.1  Cholesterol <200 mg/dL 868  HDL Cholesterol > OR = 40 mg/dL 63  LDL Cholesterol (Calc) mg/dL (calc) 56  MICROALB/CREAT RATIO <30 mg/g creat 7  Non-HDL Cholesterol (Calc) <130 mg/dL (calc) 68  Triglycerides <150 mg/dL 41    Latest Reference Range & Units 05/28/23 14:16  Microalb, Ur mg/dL 1.3  MICROALB/CREAT RATIO <30 mg/g creat 7  Creatinine, Urine 20 - 320 mg/dL 809    Latest Reference Range & Units 05/28/23 14:16  C206 ACTH  6 - 50 pg/mL 6  Cortisol, Plasma mcg/dL 1.7 (L)  (L): Data is abnormally low   Old records , labs and images have been reviewed.    In office BG 70 mg/DL  ASSESSMENT / PLAN / RECOMMENDATIONS:   1) Type 2 Diabetes Mellitus, Optimally controlled, With CKD III and macrovascular complications - Most recent A1c of 6.2 %. Goal A1c < 7.0 %.    -A1c continues to be optimal - Patient with DKA 02/2022,  NOT a candidate for SGLT2 inhibitors - Has been off basal insulin  11/2022 - No change  MEDICATIONS: Continue Ozempic   0.25 mg weekly   EDUCATION / INSTRUCTIONS: BG monitoring instructions: Patient is instructed to check his blood sugars 1 times a day, fasting. Call  Endocrinology clinic if: BG persistently < 70  I reviewed the Rule of 15 for the treatment of hypoglycemia in detail with the patient. Literature supplied.   2) Diabetic complications:  Eye: Does not have known diabetic retinopathy.  Patient encouraged to have an eye exam Neuro/ Feet: Does not have known diabetic peripheral neuropathy. Renal: Patient does  have known baseline CKD. He is  on an ACEI/ARB at present.  3) Secondary Adrenal Insufficiency:  -Patient had low serum cortisol during hospitalization , cosyntropin  stimulation test was slightly abnormal -Repeat cosyntropin  stimulation test shows worsening cortisol level 06/2022 -ACTH  at the lower end of normal - I had initially prescribed prednisone , but the patient never started it, I subsequently prescribed hydrocortisone  -But the patient had an overlap between prednisone  and hydrocortisone  pick up to the pharmacy , they have verified that the patient received 100-day supply of hydrocortisone  on 04/16/2023 and prior to that he received it in November of, 2024 - Patient also was dispensed prednisone  on/08/2023?  And prior to that he had pick up in June and August 2024 - Discussed sick day rules - Advised the patient to obtain medical alert bracelet  Medication Continue prednisone  5 mg daily  Follow-up in 6 months    Signed electronically by: Stefano Redgie Butts, MD  [facility] Endocrinology  [facility] [address] [place] [place], [place] [address] Phone: [phone] FAX: [phone]  CC: Lorren Greig PARAS, NP [address] [place] [address] Phone: [phone]  Fax: [phone]    Return to  Endocrinology clinic as below: Future Appointments  Date Time Provider Department Center  09/13/2023  9:30 AM Tremell Reimers, Donell Cardinal, MD [facility] None  10/04/2023 12:15 PM [facility] 1 [facility]  11/09/2023  3:20 PM Lorren Greig PARAS, NP [facility] None

## 2023-09-13 NOTE — Patient Instructions (Addendum)
 Continues  Ozempic   0.25 mg weekly Take Prednisone  5 mg daily with Breakfast   ADRENAL INSUFFICIENCY SICK DAY RULES:  Should you face an extreme emotional or physical stress such as trauma, surgery or acute illness, this will require extra steroid coverage so that the body can meet that stress.   Without increasing the steroid dose you may experience severe weakness, headache, dizziness, nausea and vomiting and possibly a more serious deterioration in health.  Typically the dose of steroids will only need to be increased for a couple of days if you have an illness that is transient and managed in the community.   If you are unable to take/absorb an increased dose of steroids orally because of vomiting or diarrhea, you will urgently require steroid injections and should present to an Emergency Department.  The general advice for any serious illness is as follows: Double the normal daily steroid dose for up to 3 days if you have a temperature of more than 37.50C (99.50F) with signs of sickness, or severe emotional or physical distress Contact your primary care doctor and Endocrinologist if the illness worsens or it lasts for more than 3 days.  In cases of severe illness, urgent medical assistance should be promptly sought. If you experience vomiting/diarrhea or are unable to take steroids by mouth, please administer the Hydrocortisone  injection kit and seek urgent medical help.      HOW TO TREAT LOW BLOOD SUGARS (Blood sugar LESS THAN 70 MG/DL) Please follow the RULE OF 15 for the treatment of hypoglycemia treatment (when your (blood sugars are less than 70 mg/dL)   STEP 1: Take 15 grams of carbohydrates when your blood sugar is low, which includes:  3-4 GLUCOSE TABS  OR 3-4 OZ OF JUICE OR REGULAR SODA OR ONE TUBE OF GLUCOSE GEL    STEP 2: RECHECK blood sugar in 15 MINUTES STEP 3: If your blood sugar is still low at the 15 minute recheck --> then, go back to STEP 1 and treat AGAIN with  another 15 grams of carbohydrates.

## 2023-09-14 ENCOUNTER — Ambulatory Visit: Payer: Self-pay | Admitting: Family

## 2023-09-24 ENCOUNTER — Other Ambulatory Visit: Payer: Self-pay | Admitting: Cardiology

## 2023-09-24 ENCOUNTER — Other Ambulatory Visit: Payer: Self-pay | Admitting: Family Medicine

## 2023-09-24 ENCOUNTER — Other Ambulatory Visit: Payer: Self-pay | Admitting: Family

## 2023-09-24 DIAGNOSIS — E785 Hyperlipidemia, unspecified: Secondary | ICD-10-CM

## 2023-10-04 ENCOUNTER — Ambulatory Visit (HOSPITAL_COMMUNITY)
Admission: RE | Admit: 2023-10-04 | Discharge: 2023-10-04 | Disposition: A | Discharge status: Home / Self Care | Source: Ambulatory Visit | Attending: Cardiology | Admitting: Cardiology

## 2023-10-04 DIAGNOSIS — I7789 Other specified disorders of arteries and arterioles: Secondary | ICD-10-CM | POA: Insufficient documentation

## 2023-10-04 DIAGNOSIS — I517 Cardiomegaly: Secondary | ICD-10-CM | POA: Insufficient documentation

## 2023-10-04 LAB — ECHOCARDIOGRAM COMPLETE: S' Lateral: 2.5 cm

## 2023-10-07 ENCOUNTER — Emergency Department (HOSPITAL_COMMUNITY)
Admission: EM | Admit: 2023-10-07 | Discharge: 2023-10-07 | Disposition: A | Discharge status: Home / Self Care | Attending: Emergency Medicine | Admitting: Emergency Medicine

## 2023-10-07 ENCOUNTER — Other Ambulatory Visit: Payer: Self-pay

## 2023-10-07 ENCOUNTER — Ambulatory Visit: Payer: Self-pay | Admitting: Cardiology

## 2023-10-07 ENCOUNTER — Emergency Department (HOSPITAL_COMMUNITY)

## 2023-10-07 DIAGNOSIS — N433 Hydrocele, unspecified: Secondary | ICD-10-CM

## 2023-10-07 DIAGNOSIS — N183 Chronic kidney disease, stage 3 unspecified: Secondary | ICD-10-CM | POA: Insufficient documentation

## 2023-10-07 DIAGNOSIS — Z79899 Other long term (current) drug therapy: Secondary | ICD-10-CM | POA: Insufficient documentation

## 2023-10-07 DIAGNOSIS — Z7982 Long term (current) use of aspirin: Secondary | ICD-10-CM | POA: Diagnosis not present

## 2023-10-07 DIAGNOSIS — Z794 Long term (current) use of insulin: Secondary | ICD-10-CM | POA: Diagnosis not present

## 2023-10-07 DIAGNOSIS — N4340 Spermatocele of epididymis, unspecified: Secondary | ICD-10-CM | POA: Diagnosis not present

## 2023-10-07 DIAGNOSIS — E119 Type 2 diabetes mellitus without complications: Secondary | ICD-10-CM | POA: Insufficient documentation

## 2023-10-07 DIAGNOSIS — I129 Hypertensive chronic kidney disease with stage 1 through stage 4 chronic kidney disease, or unspecified chronic kidney disease: Secondary | ICD-10-CM | POA: Diagnosis not present

## 2023-10-07 DIAGNOSIS — N5089 Other specified disorders of the male genital organs: Secondary | ICD-10-CM | POA: Diagnosis present

## 2023-10-07 LAB — CBC WITH DIFFERENTIAL/PLATELET
Abs Immature Granulocytes: 0.04 K/uL (ref 0.00–0.07)
Basophils Absolute: 0 K/uL (ref 0.0–0.1)
Basophils Relative: 0 %
Eosinophils Absolute: 0.2 K/uL (ref 0.0–0.5)
Eosinophils Relative: 3 %
HCT: 40.3 % (ref 39.0–52.0)
Hemoglobin: 12.5 g/dL — ABNORMAL LOW (ref 13.0–17.0)
Immature Granulocytes: 1 %
Lymphocytes Relative: 24 %
Lymphs Abs: 1.9 K/uL (ref 0.7–4.0)
MCH: 27.8 pg (ref 26.0–34.0)
MCHC: 31 g/dL (ref 30.0–36.0)
MCV: 89.8 fL (ref 80.0–100.0)
Monocytes Absolute: 0.6 K/uL (ref 0.1–1.0)
Monocytes Relative: 8 %
Neutro Abs: 5.1 K/uL (ref 1.7–7.7)
Neutrophils Relative %: 64 %
Platelets: 184 K/uL (ref 150–400)
RBC: 4.49 MIL/uL (ref 4.22–5.81)
RDW: 14.3 % (ref 11.5–15.5)
WBC: 7.9 K/uL (ref 4.0–10.5)
nRBC: 0 % (ref 0.0–0.2)

## 2023-10-07 LAB — BASIC METABOLIC PANEL WITH GFR
Anion gap: 11 (ref 5–15)
BUN: 23 mg/dL (ref 8–23)
CO2: 26 mmol/L (ref 22–32)
Calcium: 9.3 mg/dL (ref 8.9–10.3)
Chloride: 107 mmol/L (ref 98–111)
Creatinine, Ser: 1.97 mg/dL — ABNORMAL HIGH (ref 0.61–1.24)
GFR, Estimated: 37 mL/min — ABNORMAL LOW (ref >60)
Glucose, Bld: 92 mg/dL (ref 70–99)
Potassium: 3.7 mmol/L (ref 3.5–5.1)
Sodium: 143 mmol/L (ref 135–145)

## 2023-10-07 LAB — URINALYSIS, ROUTINE W REFLEX MICROSCOPIC
Bilirubin Urine: NEGATIVE
Glucose, UA: NEGATIVE mg/dL
Hgb urine dipstick: NEGATIVE
Ketones, ur: NEGATIVE mg/dL
Leukocytes,Ua: NEGATIVE
Nitrite: NEGATIVE
Protein, ur: NEGATIVE mg/dL
Specific Gravity, Urine: 1.02 (ref 1.005–1.030)
pH: 5 (ref 5.0–8.0)

## 2023-10-07 MED ORDER — OXYCODONE-ACETAMINOPHEN 5-325 MG PO TABS
1.0000 | ORAL_TABLET | Freq: Once | ORAL | Status: AC
  Administered 2023-10-07: 1 via ORAL
  Filled 2023-10-07: qty 1

## 2023-10-07 MED ORDER — LEVOFLOXACIN 250 MG PO TABS: 250.0000 mg | ORAL_TABLET | Freq: Every day | ORAL | 0 refills | Status: AC

## 2023-10-07 NOTE — Discharge Instructions (Addendum)
 We evaluated you for your testicular pain and swelling.  Your ultrasound showed fluid around your right testicle.  This is not usually dangerous.  Since you have had significant increase in swelling over the past few days as well as increasing pain, we will treat you with a short course of antibiotics in case there is an underlying infection.  Please follow-up closely with your urologist.  Please call them as soon as possible to schedule a follow-up appointment.    Please return if you develop any new or worsening symptoms.

## 2023-10-07 NOTE — ED Triage Notes (Signed)
 Patient to ED with c/o swelling to groin area. Pain started yesterday denies any new meds, creams or lotions, he is able to urinate.

## 2023-10-07 NOTE — ED Provider Notes (Signed)
 [place] EMERGENCY DEPARTMENT AT [facility] Provider Note  CSN: [id number] Arrival date & time: 10/07/23 9197  Chief Complaint(s) Groin Swelling  HPI Curtis Saunders is a 68 y.o. male history of diabetes, prior stroke, CKD, hypertension presenting to the emergency department with testicular swelling.  Patient reports that he has had testicular swelling for the last 1 to 2 days.  Worse on the right.  Reports this happened to him almost a year ago, came to the ER and had an ultrasound but never saw anybody afterwards and no one ever told him what was wrong.  Denies any urinary symptoms, fevers, chills.  No nausea or vomiting, diarrhea.   Past Medical History Past Medical History:  Diagnosis Date   Diabetes mellitus (HCC)    Hypertension    Stroke (HCC) 2023   x 2, no deficits   Patient Active Problem List   Diagnosis Date Noted   Secondary adrenal insufficiency (HCC) 11/28/2022   Diabetes mellitus (HCC) 06/16/2022   Low serum cortisol level 06/16/2022   Benign essential HTN 03/11/2022   Uncontrolled type 2 diabetes mellitus with hyperglycemia, with long-term current use of insulin  (HCC) 03/11/2022   Acute urinary retention 03/11/2022   Obesity (BMI 30-39.9) 03/11/2022   Acute retention of urine 03/11/2022   Acute kidney injury superimposed on chronic kidney disease (HCC) 03/01/2022   New onset type 2 diabetes mellitus (HCC) 02/28/2022   BPH (benign prostatic hyperplasia) 02/28/2022   Pain in joint of right shoulder 01/09/2022   Partial thickness rotator cuff tear 01/09/2022   Osteoarthritis of right knee 11/08/2021   Pain in joint of right knee 11/08/2021   Low back pain 07/14/2021   Pain in joint of left shoulder 08/12/2020   Stage 3 chronic kidney disease (HCC) 04/12/2020   CVA (cerebral vascular accident) (HCC) 02/27/2020   Ascending aortic aneurysm (HCC) 02/11/2020   Double pterygium, 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 Authorizing Provider  levofloxacin  (LEVAQUIN ) 250 MG tablet Take 1 tablet (250 mg total) by mouth daily for 10 days. 10/07/23 10/17/23 Yes Francesca Elsie CROME, MD  Accu-Chek FastClix Lancets MISC Apply topically. 03/07/22   [provider]  aspirin  EC 81 MG EC tablet Take 1 tablet (81 mg total) by mouth daily. Swallow whole. 02/28/20   Ghimire, Kuber, MD  blood glucose meter kit and supplies Dispense based on patient and insurance preference. Use up to four times daily as directed. (FOR ICD-10 E10.9, E11.9). 03/02/22   Rai, Ripudeep K, MD  Blood Glucose Monitoring Suppl (ACCU-CHEK AVIVA PLUS) w/Device KIT 1 each by Does not apply route 4 (four) times daily -  before meals and at bedtime. 03/07/22   Lorren Greig PARAS, NP  finasteride (PROSCAR) 5 MG tablet Take 5 mg by mouth daily. 11/24/22   [provider]  glucose blood (ACCU-CHEK GUIDE) test strip Use as instructed 03/07/22   Lorren Greig PARAS, NP  hydrochlorothiazide  (HYDRODIURIL ) 25 MG tablet Take 1 tablet by mouth once daily 10/01/23   Tanda Bleacher, MD  Lancets Misc. (ACCU-CHEK FASTCLIX LANCET) KIT 1 each by Does not apply route 4 (four) times daily -  before meals and at bedtime. 03/07/22   Lorren Greig PARAS, NP  methocarbamol  (ROBAXIN ) 500 MG tablet Take 2 tablets (1,000 mg total) by mouth  every 6 (six) hours as needed for muscle spasms. 09/08/23   Kammerer, Megan L, DO  predniSONE  (DELTASONE ) 5 MG tablet Take 5 mg by mouth every morning. 05/01/23   [provider]  ramipril  (ALTACE ) 10 MG capsule Take 1 capsule by mouth once daily 09/26/23   Lavona Agent, MD  rosuvastatin  (CRESTOR ) 20 MG tablet Take 1 tablet by mouth once daily 07/30/23   Lorren Greig PARAS, NP  Semaglutide ,0.25 or 0.5MG /DOS, 2 MG/3ML SOPN Inject 0.5 mg into the skin once  a week. 11/28/22   Shamleffer, Ibtehal Jaralla, MD  tamsulosin  (FLOMAX ) 0.4 MG CAPS capsule Take 1 capsule (0.4 mg total) by mouth daily. 03/02/22   Davia Nydia POUR, MD                                                                                                                                    Past Surgical History Past Surgical History:  Procedure Laterality Date   APPENDECTOMY     EYE SURGERY     HAND SURGERY     KNEE SURGERY     x3, x1 R   LIPOMA EXCISION Left 10/13/2019   Procedure: SHOULDER EXCISION LIPOMA AND ROTATOR CUFF REPAIR;  Surgeon: Dozier Soulier, MD;  Location: [facility];  Service: Orthopedics;  Laterality: Left;   TONSILLECTOMY     Family History Family History  Problem Relation Age of Onset   Heart attack Mother 39   COPD Mother    Emphysema Mother    Diabetes Father    Diabetes Sister    Hypertension Sister    Sleep apnea Neg Hx     Social History Social History   Tobacco Use   Smoking status: Never    Passive exposure: Never   Smokeless tobacco: Never  Vaping Use   Vaping status: Never Used  Substance Use Topics   Alcohol use: Yes    Comment: occasional   Drug use: No   Allergies Bee venom  Review of Systems Review of Systems  All other systems reviewed and are negative.   Physical Exam Vital Signs  I have reviewed the triage vital signs BP 133/82   Pulse 62   Temp (!) 97.4 F (36.3 C) (Oral)   Resp 16   Ht 6' (1.829 m)   Wt 89.4 kg   SpO2 99%   BMI 26.72 kg/m  Physical Exam Vitals and nursing note reviewed.  Constitutional:      General: He is not in acute distress.    Appearance: Normal appearance.  HENT:     Mouth/Throat:     Mouth: Mucous membranes are moist.  Eyes:     Conjunctiva/sclera: Conjunctivae normal.  Cardiovascular:     Rate and Rhythm: Normal rate and regular rhythm.  Pulmonary:     Effort: Pulmonary effort is normal. No respiratory distress.     Breath sounds: Normal breath sounds.   Abdominal:  General: Abdomen is flat.     Palpations: Abdomen is soft.     Tenderness: There is no abdominal tenderness.  Genitourinary:    Comments: Mildly enlarged right testicle with tenderness.  Left testicle normal.  Otherwise normal external genitalia.  No wound. Musculoskeletal:     Right lower leg: No edema.     Left lower leg: No edema.  Skin:    General: Skin is warm and dry.     Capillary Refill: Capillary refill takes less than 2 seconds.  Neurological:     Mental Status: He is alert and oriented to person, place, and time. Mental status is at baseline.  Psychiatric:        Mood and Affect: Mood normal.        Behavior: Behavior normal.     ED Results and Treatments Labs (all labs ordered are listed, but only abnormal results are displayed) Labs Reviewed  BASIC METABOLIC PANEL WITH GFR - Abnormal; Notable for the following components:      Result Value   Creatinine, Ser 1.97 (*)    GFR, Estimated 37 (*)    All other components within normal limits  CBC WITH DIFFERENTIAL/PLATELET - Abnormal; Notable for the following components:   Hemoglobin 12.5 (*)    All other components within normal limits  URINALYSIS, ROUTINE W REFLEX MICROSCOPIC                                                                                                                          Radiology US  SCROTUM W/DOPPLER Result Date: 10/07/2023 CLINICAL DATA:  Right testicular pain EXAM: SCROTAL ULTRASOUND DOPPLER ULTRASOUND OF THE TESTICLES TECHNIQUE: Complete ultrasound examination of the testicles, epididymis, and other scrotal structures was performed. Color and spectral Doppler ultrasound were also utilized to evaluate blood flow to the testicles. COMPARISON:  01/26/2023 FINDINGS: Right testicle Measurements: 3.2 by 2.1 by 2.5 cm. No mass or microlithiasis visualized. Left testicle Measurements: 3.6 by 1.3 by 1.8 cm. Mild left testicular heterogeneity diffusely, slightly more notable than on  01/26/2023, without a well-defined focal mass. This could potentially be inflammatory or postinflammatory. Right epididymis: 0.6 cm in long axis right epididymal cyst or spermatocele. 0.4 cm epididymal appendix incidentally noted. Left epididymis:  Normal in size and appearance. Hydrocele: Large right and small left hydroceles. At least 1 septation in the left hydrocele. Varicocele:  None visualized. Pulsed Doppler interrogation of both testes demonstrates normal low resistance arterial and venous waveforms bilaterally. Other: Cutaneous edema/thickening along the right groin, with hydrocele or fluid extending along the spermatic cords towards the groin region. IMPRESSION: 1. Large right and small left hydroceles. 2. Mild left testicular heterogeneity, slightly more notable than on 01/26/2023, without a well-defined focal mass. This could potentially be inflammatory or postinflammatory. 3. Cutaneous edema/thickening along the right groin, with hydrocele or fluid extending along the spermatic cords towards the groin region. 4. 0.6 cm right epididymal cyst or spermatocele. Incidental right epididymal appendix noted. Electronically Signed  By: Ryan Salvage M.D.   On: 10/07/2023 10:34    Pertinent labs & imaging results that were available during my care of the patient were reviewed by me and considered in my medical decision making (see MDM for details).  Medications Ordered in ED Medications  oxyCODONE -acetaminophen  (PERCOCET/ROXICET) 5-325 MG per tablet 1 tablet (1 tablet Oral Given 10/07/23 0950)                                                                                                                                     Procedures Procedures  (including critical care time)  Medical Decision Making / ED Course   MDM:  68 year old presented to the emergency department with testicular pain.  He does have slightly enlarged right testicle with some tenderness.  Will obtain ultrasound.   Differential includes torsion, epididymitis, hydrocele, orchitis, UTI.  Will obtain basic labs and urinalysis.  Will reassess.  Clinical Course as of 10/07/23 1141  Sun Oct 07, 2023  1135 US  SCROTUM W/DOPPLER [WS]  1138 Ultrasound shows no torsion.  Nonspecific abnormality of the left testicle.  This has been present previously.  Also shows right hydrocele which is large with some fluid along the spermatic cord.  Patient reports that he thinks this is what he had last time.  He reports he received antibiotics and improved.  He follow-up with urology as an outpatient.  Although no clear epididymitis given his tenderness and swelling, will treat empirically with Levaquin .  Recommend close urology follow-up. Will discharge patient to home. All questions answered. Patient comfortable with plan of discharge. Return precautions discussed with patient and specified on the after visit summary.  [WS]    Clinical Course User Index [WS] Francesca Elsie CROME, MD     Additional history obtained:  -External records from outside source obtained and reviewed including: Chart review including previous notes, labs, imaging, consultation notes including prior notes   Lab Tests: -I ordered, reviewed, and interpreted labs.   The pertinent results include:   Labs Reviewed  BASIC METABOLIC PANEL WITH GFR - Abnormal; Notable for the following components:      Result Value   Creatinine, Ser 1.97 (*)    GFR, Estimated 37 (*)    All other components within normal limits  CBC WITH DIFFERENTIAL/PLATELET - Abnormal; Notable for the following components:   Hemoglobin 12.5 (*)    All other components within normal limits  URINALYSIS, ROUTINE W REFLEX MICROSCOPIC    Notable for grossly stable CKD.  No leukocytosis or UTI  Imaging Studies ordered: I ordered imaging studies including US  scrotum On my interpretation imaging demonstrates no torsion I independently visualized and interpreted imaging. I agree with the  radiologist interpretation   Medicines ordered and prescription drug management: Meds ordered this encounter  Medications   oxyCODONE -acetaminophen  (PERCOCET/ROXICET) 5-325 MG per tablet 1 tablet    Refill:  0   levofloxacin  (LEVAQUIN ) 250 MG tablet  Sig: Take 1 tablet (250 mg total) by mouth daily for 10 days.    Dispense:  10 tablet    Refill:  0    -I have reviewed the patients home medicines and have made adjustments as needed   Reevaluation: After the interventions noted above, I reevaluated the patient and found that their symptoms have improved  Co morbidities that complicate the patient evaluation  Past Medical History:  Diagnosis Date   Diabetes mellitus (HCC)    Hypertension    Stroke (HCC) 2023   x 2, no deficits      Dispostion: Disposition decision including need for hospitalization was considered, and patient discharged from emergency department.    Final Clinical Impression(s) / ED Diagnoses Final diagnoses:  Hydrocele of spermatic cord     This chart was dictated using voice recognition software.  Despite best efforts to proofread,  errors can occur which can change the documentation meaning.    Francesca Elsie CROME, MD 10/07/23 1141

## 2023-10-08 NOTE — Telephone Encounter (Signed)
 Spoke with pt regarding his results. Pt brought up that he has occasional chest pain that only occurs at night and only lasts seconds. Pt stated the pain does not radiate. Pt denied any chest pain while on the phone. Pt was given ED precautions and was told that Dr. Lavona would be notified. Pt verbalized understanding. All questions if any were answered. Results sent to PCP.

## 2023-10-08 NOTE — Telephone Encounter (Signed)
-----   Message from Lynwood Schilling sent at 10/07/2023  6:20 PM EDT ----- The echo shows mild LVH .  The aorta looked normal without significant enlargement.   Call Mr. Kye with the results and send results to Lorren Greig PARAS, NP  ----- Message ----- From: Interface, [employer] Sent: 10/04/2023   2:54 PM EDT To: Lynwood Schilling, MD

## 2023-10-16 ENCOUNTER — Telehealth: Payer: Self-pay | Admitting: Cardiology

## 2023-10-16 NOTE — Telephone Encounter (Signed)
Patient is returning a call for results.

## 2023-10-16 NOTE — Telephone Encounter (Signed)
  Curtis Schilling, MD 10/14/2023  3:10 PM EDT     I would suggest that he talk to his primary provider about the pain he is describing.      Curtis Schilling, MD 10/07/2023  6:20 PM EDT     The echo shows mild LVH .  The aorta looked normal without significant enlargement.   Call Curtis Saunders with the results and send results to Lorren Greig PARAS, NP      Pt is aware of the above information and will reach out to his PCP to discuss pain he was having.  He was appreciative of the call and information.

## 2023-10-22 ENCOUNTER — Encounter: Payer: Self-pay | Admitting: Podiatry

## 2023-10-22 ENCOUNTER — Ambulatory Visit (INDEPENDENT_AMBULATORY_CARE_PROVIDER_SITE_OTHER): Admitting: Podiatry

## 2023-10-22 DIAGNOSIS — B351 Tinea unguium: Secondary | ICD-10-CM | POA: Diagnosis not present

## 2023-10-22 NOTE — Progress Notes (Unsigned)
      Subjective:  Patient ID: Curtis Saunders, male    DOB: [birth year],  MRN: [id number]  Curtis Saunders presents to clinic today for c/o fungal nail to right great toenail.  Notes it's very thick and discolored.  He is interested in treating it.    PCP is Lorren Greig PARAS, NP.  Past Medical History:  Diagnosis Date   Diabetes mellitus (HCC)    Hypertension    Stroke (HCC) 2023   x 2, no deficits   Past Surgical History:  Procedure Laterality Date   APPENDECTOMY     EYE SURGERY     HAND SURGERY     KNEE SURGERY     x3, x1 R   LIPOMA EXCISION Left 10/13/2019   Procedure: SHOULDER EXCISION LIPOMA AND ROTATOR CUFF REPAIR;  Surgeon: Dozier Soulier, MD;  Location:  SURGERY CENTER;  Service: Orthopedics;  Laterality: Left;   TONSILLECTOMY     Allergies  Allergen Reactions   Bee Venom Anaphylaxis    Review of Systems: Negative except as noted in the HPI.  Objective:  Vascular Examination: Capillary refill time is 3-5 seconds to toes bilateral. Palpable pedal pulses b/l LE. Digital hair present b/l.    Dermatological Examination: Pedal skin with normal turgor, texture and tone b/l. No open wounds. No interdigital macerations b/l.  The right hallux toenail is 5mm thick, discolored, dystrophic with subungual debris. There is pain with compression of the nail plate.       Latest Ref Rng & Units 09/13/2023    9:19 AM 05/28/2023    1:53 PM 11/28/2022    1:35 PM  Hemoglobin A1C  Hemoglobin-A1c 4.0 - 5.6 % 6.2  5.9  6.1    Assessment/Plan: 1. Dermatophytosis of nail    The mycotic right hallux toenail was sharply debrided with sterile nail nippers and a power debriding burr to decrease bulk/thickness and length.    Clippings of the affected toenails were obtained and sent to [facility] laboratory for fungal nail culture.  Informed patient it may take up to 3 weeks to receive the final report.  Will contact the patient to review the results.  We will discuss  treatment plan at that time and follow-up.  Discussed topical, oral, and laser nail treatment options with the patient today.  Discussed risks involved as well.    Awanda CHARM Imperial, DPM, FACFAS [facility]     [address] [place], [place] [address]                Office [phone]  Fax [phone]

## 2023-10-24 ENCOUNTER — Other Ambulatory Visit: Payer: Self-pay | Admitting: Family

## 2023-10-24 DIAGNOSIS — E785 Hyperlipidemia, unspecified: Secondary | ICD-10-CM

## 2023-10-24 NOTE — Telephone Encounter (Signed)
 Complete

## 2023-11-09 ENCOUNTER — Ambulatory Visit (INDEPENDENT_AMBULATORY_CARE_PROVIDER_SITE_OTHER): Admitting: Family

## 2023-11-09 ENCOUNTER — Encounter: Payer: Self-pay | Admitting: Family

## 2023-11-09 VITALS — BP 123/86 | HR 57 | Temp 97.9°F | Resp 16 | Ht 72.0 in | Wt 198.6 lb

## 2023-11-09 DIAGNOSIS — G8929 Other chronic pain: Secondary | ICD-10-CM

## 2023-11-09 DIAGNOSIS — M25511 Pain in right shoulder: Secondary | ICD-10-CM

## 2023-11-09 DIAGNOSIS — I10 Essential (primary) hypertension: Secondary | ICD-10-CM

## 2023-11-09 MED ORDER — TRIAMCINOLONE ACETONIDE 40 MG/ML IJ SUSP
40.0000 mg | Freq: Once | INTRAMUSCULAR | Status: AC
  Administered 2023-11-09: 40 mg via INTRAMUSCULAR

## 2023-11-09 NOTE — Progress Notes (Signed)
 Patient ID: Curtis Saunders, male    DOB: 12-Mar-1955  MRN: [id number]  CC: Chronic Conditions Follow-Up  Subjective: Curtis Saunders is a 68 y.o. male who presents for chronic conditions follow-up.   His concerns today include:  - Right shoulder pain persisting. Denies recent trauma/injury and red flag symptoms. States he never received call from Orthopedics. He would like a steroid injection. - States blood pressure drops low sometimes causing him to feel faint. Established with Cardiology for chronic heart-related conditions. He plans to schedule a follow-up appointment with Cardiology soon. Today he does not complain of red flag symptoms such as but not limited to chest pain, shortness of breath, worst headache of life, nausea/vomiting.   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   Diabetes mellitus (HCC) 06/16/2022   Low serum cortisol level 06/16/2022   Benign essential HTN 03/11/2022   Uncontrolled type 2 diabetes mellitus with hyperglycemia, with long-term current use of insulin  (HCC) 03/11/2022   Acute urinary retention 03/11/2022   Obesity (BMI 30-39.9) 03/11/2022   Acute retention of urine 03/11/2022   Acute kidney injury superimposed on chronic kidney disease 03/01/2022   New onset type 2 diabetes mellitus (HCC) 02/28/2022   BPH (benign prostatic hyperplasia) 02/28/2022   Pain in joint of right shoulder 01/09/2022   Partial thickness rotator cuff tear 01/09/2022   Osteoarthritis of right knee 11/08/2021   Pain in joint of right knee 11/08/2021   Low back pain 07/14/2021   Pain in joint of left shoulder 08/12/2020   Stage 3 chronic kidney disease (HCC) 04/12/2020   CVA (cerebral vascular accident) (HCC) 02/27/2020   Ascending aortic aneurysm 02/11/2020   Double pterygium, 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   Essential hypertension 03/06/2018   LVH (left ventricular hypertrophy) 02/23/2018   HTN (hypertension), malignant  02/20/2018   Chest pain 02/20/2018     Current Outpatient Medications on File Prior to Visit  Medication Sig Dispense Refill   Accu-Chek FastClix Lancets MISC Apply topically.     aspirin  EC 81 MG EC tablet Take 1 tablet (81 mg total) by mouth daily. Swallow whole. 30 tablet 11   blood glucose meter kit and supplies Dispense based on patient and insurance preference. Use up to four times daily as directed. (FOR ICD-10 E10.9, E11.9). 1 each 0   Blood Glucose Monitoring Suppl (ACCU-CHEK AVIVA PLUS) w/Device KIT 1 each by Does not apply route 4 (four) times daily -  before meals and at bedtime. 1 kit 0   glucose blood (ACCU-CHEK GUIDE) test strip Use as instructed 100 each 2   hydrochlorothiazide  (HYDRODIURIL ) 25 MG tablet Take 1 tablet by mouth once daily 90 tablet 0   Lancets Misc. (ACCU-CHEK FASTCLIX LANCET) KIT 1 each by Does not apply route 4 (four) times daily -  before meals and at bedtime. 1 kit 2   ramipril  (ALTACE ) 10 MG capsule Take 1 capsule by mouth once daily 90 capsule 3   rosuvastatin  (CRESTOR ) 20 MG tablet Take 1 tablet by mouth once daily 90 tablet 0   Semaglutide ,0.25 or 0.5MG /DOS, 2 MG/3ML SOPN Inject 0.5 mg into the skin once a week. 9 mL 2   tamsulosin  (FLOMAX ) 0.4 MG CAPS capsule Take 1 capsule (0.4 mg total) by mouth daily. 30 capsule 3   finasteride (PROSCAR) 5 MG tablet Take 5 mg by mouth daily. (Patient not taking: Reported on 11/09/2023)     methocarbamol  (ROBAXIN ) 500 MG tablet Take 2 tablets (1,000  mg total) by mouth every 6 (six) hours as needed for muscle spasms. (Patient not taking: Reported on 11/09/2023) 56 tablet 0   predniSONE  (DELTASONE ) 5 MG tablet Take 5 mg by mouth every morning. (Patient not taking: Reported on 11/09/2023)     No current facility-administered medications on file prior to visit.    Allergies  Allergen Reactions   Bee Venom Anaphylaxis    Social History   Socioeconomic History   Marital status: Significant Other    Spouse name:  Not on file   Number of children: Not on file   Years of education: Not on file   Highest education level: Not on file  Occupational History   Occupation: retired  Tobacco Use   Smoking status: Never    Passive exposure: Never   Smokeless tobacco: Never  Vaping Use   Vaping status: Never Used  Substance and Sexual Activity   Alcohol use: Yes    Comment: occasional   Drug use: No   Sexual activity: Yes  Other Topics Concern   Not on file  Social History Narrative   ** Merged History Encounter **       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     Social Drivers of Corporate investment banker Strain: Low Risk  (08/08/2023)   Overall Financial Resource Strain (CARDIA)    Difficulty of Paying Living Expenses: Not hard at all  Food Insecurity: No Food Insecurity (08/08/2023)   Hunger Vital Sign    Worried About Running Out of Food in the Last Year: Never true    Ran Out of Food in the Last Year: Never true  Transportation Needs: No Transportation Needs (08/08/2023)   PRAPARE - Administrator, Civil Service (Medical): No    Lack of Transportation (Non-Medical): No  Physical Activity: Insufficiently Active (08/08/2023)   Exercise Vital Sign    Days of Exercise per Week: 3 days    Minutes of Exercise per Session: 30 min  Stress: No Stress Concern Present (08/08/2023)   [employer] of Occupational Health - Occupational Stress Questionnaire    Feeling of Stress: Not at all  Social Connections: Socially Isolated (08/08/2023)   Social Connection and Isolation Panel    Frequency of Communication with Friends and Family: Twice a week    Frequency of Social Gatherings with Friends and Family: Once a week    Attends Religious Services: Never    Database administrator or Organizations: No    Attends Banker Meetings: Never    Marital Status: Never married  Intimate Partner Violence: Not At Risk (08/08/2023)   Humiliation, Afraid, Rape, and Kick questionnaire     Fear of Current or Ex-Partner: No    Emotionally Abused: No    Physically Abused: No    Sexually Abused: No    Family History  Problem Relation Age of Onset   Heart attack Mother 71   COPD Mother    Emphysema Mother    Diabetes Father    Diabetes Sister    Hypertension Sister    Sleep apnea Neg Hx     Past Surgical History:  Procedure Laterality Date   APPENDECTOMY     EYE SURGERY     HAND SURGERY     KNEE SURGERY     x3, x1 R   LIPOMA EXCISION Left 10/13/2019   Procedure: SHOULDER EXCISION LIPOMA AND ROTATOR CUFF REPAIR;  Surgeon: Dozier Soulier, MD;  Location: [facility];  Service: Orthopedics;  Laterality: Left;   TONSILLECTOMY      ROS: Review of Systems Negative except as stated above  PHYSICAL EXAM: BP 123/86   Pulse (!) 57   Temp 97.9 F (36.6 C) (Oral)   Resp 16   Ht 6' (1.829 m)   Wt 198 lb 9.6 oz (90.1 kg)   SpO2 98%   BMI 26.94 kg/m   Physical Exam HENT:     Head: Normocephalic and atraumatic.     Nose: Nose normal.     Mouth/Throat:     Mouth: Mucous membranes are moist.     Pharynx: Oropharynx is clear.  Eyes:     Extraocular Movements: Extraocular movements intact.     Conjunctiva/sclera: Conjunctivae normal.     Pupils: Pupils are equal, round, and reactive to light.  Cardiovascular:     Rate and Rhythm: Bradycardia present.     Pulses: Normal pulses.     Heart sounds: Normal heart sounds.  Pulmonary:     Effort: Pulmonary effort is normal.     Breath sounds: Normal breath sounds.  Musculoskeletal:        General: Normal range of motion.     Right shoulder: Normal.     Left shoulder: Normal.     Right upper arm: Normal.     Left upper arm: Normal.     Right elbow: Normal.     Left elbow: Normal.     Right forearm: Normal.     Left forearm: Normal.     Right wrist: Normal.     Left wrist: Normal.     Right hand: Normal.     Left hand: Normal.     Cervical back: Normal range of motion and neck supple.   Neurological:     General: No focal deficit present.     Mental Status: He is alert and oriented to person, place, and time.  Psychiatric:        Mood and Affect: Mood normal.        Behavior: Behavior normal.    ASSESSMENT AND PLAN: 1. Chronic right shoulder pain (Primary) - Triamcinolone  Acetonide injection administered in office. - Referral to Orthopedic Surgery for evaluation/management.  - Follow-up with primary provider as scheduled. - Ambulatory referral to Orthopedic Surgery - triamcinolone  acetonide (KENALOG -40) injection 40 mg  2. Primary hypertension - Blood pressure normal today in office.  - Continue present management. - Keep all scheduled appointments with established Cardiology.  - Follow-up with primary provider as scheduled.   Patient was given the opportunity to ask questions.  Patient verbalized understanding of the plan and was able to repeat key elements of the plan. Patient was given clear instructions to go to Emergency Department or return to medical center if symptoms don't improve, worsen, or new problems develop.The patient verbalized understanding.   Orders Placed This Encounter  Procedures   Ambulatory referral to Orthopedic Surgery    Return in about 3 months (around 02/09/2024) for Follow-Up or next available chronic conditions.  Greig JINNY Drones, NP

## 2023-11-09 NOTE — Progress Notes (Signed)
 Follow up on shoulder, patient stated he pass out because his b/p drops too low

## 2023-11-12 ENCOUNTER — Telehealth: Payer: Self-pay | Admitting: Cardiology

## 2023-11-12 ENCOUNTER — Ambulatory Visit: Payer: Self-pay | Admitting: Podiatry

## 2023-11-12 NOTE — Telephone Encounter (Signed)
 Pt c/o BP issue: STAT if pt c/o blurred vision, one-sided weakness or slurred speech.  STAT if BP is GREATER than 180/120 TODAY.  STAT if BP is LESS than 90/60 and SYMPTOMATIC TODAY  1. What is your BP concern? Hypotension   2. Have you taken any BP medication today? Yes   3. What are your last 5 BP readings? Readings at home   4. Are you having any other symptoms (ex. Dizziness, headache, blurred vision, passed out)? Dizzy, passing out   Pt states he has been experiencing very low BP and passing out. Pt did not have readings with him, but says his PCP told him it was BP and advised he see his cardiologist. Please advise.

## 2023-11-12 NOTE — Telephone Encounter (Signed)
 Spoke with pt regarding his blood pressure. Pt stated his blood pressure has been dropping. Pt stated he gets dizzy and then passes out. Pt stated he passed out multiple times last month. Pt stated he never hit his head and has always landed on a chair or on the bed. Pt stated his PCP recommended he call his cardiologist. Pt is taking his medications as prescribed including the hydrochlorothiazide . The pt provided blood pressure readings from the last month: 90/68, 84/69, 75/51, 112/84, 120/81, 111/77, 123/82, 112/70, 120/75, 154/104, 171/96, 176/15, 114/80. Pt's bp while on the phone was 144/80. Pt was given ED precautions and told that Dr. Lavona would be notified. Pt verbalized understanding. All questions if any were answered.

## 2023-11-12 NOTE — Telephone Encounter (Signed)
Attempted to call pt, unable to reach. LMTCB.

## 2023-11-12 NOTE — Telephone Encounter (Signed)
 Pt returning call

## 2023-11-13 MED ORDER — RAMIPRIL 5 MG PO CAPS: 5.0000 mg | ORAL_CAPSULE | Freq: Every day | ORAL | 3 refills | Status: DC

## 2023-11-13 NOTE — Telephone Encounter (Signed)
 Pt notified of Hochrein's suggestion. Pt agreeable. New prescription sent. Pt verbalized understanding. All questions if any were answered.

## 2023-11-15 MED ORDER — CICLOPIROX 8 % EX SOLN: Freq: Every day | CUTANEOUS | 0 refills | Status: AC

## 2023-11-18 ENCOUNTER — Other Ambulatory Visit: Payer: Self-pay | Admitting: Internal Medicine

## 2023-11-26 ENCOUNTER — Ambulatory Visit: Admitting: Internal Medicine

## 2024-01-02 ENCOUNTER — Other Ambulatory Visit: Payer: Self-pay | Admitting: Internal Medicine

## 2024-01-02 ENCOUNTER — Ambulatory Visit: Payer: Self-pay

## 2024-01-02 NOTE — Telephone Encounter (Signed)
°  FYI Only or Action Required?: FYI only for provider: appointment scheduled on 01/09/24. Action needed: patient is requesting to start home health visiting nursing.   Patient was last seen in primary care on 11/09/2023 by Jaycee Greig PARAS, NP.  Called Nurse Triage reporting Mass.  Symptoms began several months ago.changing in shape and size recently  Interventions attempted: Nothing.  Symptoms are: gradually worsening.  Triage Disposition: See PCP Within 2 Weeks  Patient/caregiver understands and will follow disposition?: Yes   Copied from CRM [id number]. Topic: Clinical - Red Word Triage >> Jan 02, 2024 11:34 AM Winona R wrote: Lump on his back the size of a softball pt has an appointment for Jan but he's would like a sooner appointment Answer Assessment - Initial Assessment Questions Additional info: Patient called to rescheduled appointment January 12th to sooner date to evaluated softball size lump on his back that is recently increasing. He would also like assistance with starting home health care. Appointment scheduled with alternate provider on 01/09/24 to evaluated lump. Patient is aware message will be sent to pcp that he is interested in home health care.   1. APPEARANCE of SWELLING: What does it look like?     Softball  2. SIZE: How large is the swelling? (e.g., inches, cm; or compare to size of pinhead, tip of pen, eraser, coin, pea, grape, ping pong ball)      Size of softball  3. LOCATION: Where is the swelling located?     On his back  4. ONSET: When did the swelling start?     One year worsening recently  5. COLOR: What color is it? Is there more than one color?     flesh 6. PAIN: Is there any pain? If Yes, ask: How bad is the pain? (Scale 1-10; or mild, moderate, severe)       Occasional burning sensation 7. ITCH: Does it itch? If Yes, ask: How bad is the itch?      Denies  8. CAUSE: What do you think caused the swelling?     unsure 9 OTHER  SYMPTOMS: Do you have any other symptoms? (e.g., fever)     denies  Protocols used: Skin Lump or Localized Swelling-A-AH

## 2024-01-03 NOTE — Telephone Encounter (Signed)
 Noted

## 2024-01-09 ENCOUNTER — Ambulatory Visit

## 2024-01-09 VITALS — BP 170/117 | Temp 98.2°F | Resp 17 | Ht 71.0 in | Wt 207.0 lb

## 2024-01-09 DIAGNOSIS — R222 Localized swelling, mass and lump, trunk: Secondary | ICD-10-CM

## 2024-01-09 NOTE — Progress Notes (Unsigned)
° ° ° °  Patient ID: Curtis Saunders, male    DOB: [birth year]  MRN: [id number]  CC: Establish Care (Lump on back that burns and hurt)   Subjective: Curtis Saunders is a 68 y.o. male with past medical history of *** who presents to clinic for    Allergies[1]  ROS: Review of Systems Negative except as stated above  PHYSICAL EXAM: BP (!) 170/117   Temp 98.2 F (36.8 C)   Resp 17   Ht 5' 11 (1.803 m)   Wt 207 lb (93.9 kg)   SpO2 98%   BMI 28.87 kg/m   Physical Exam  General: well-appearing, no acute distress Skin: no jaundice, rashes, or lesions Cardiovascular: regular heart rate and rhythm, normal S1/S2, no murmurs, gallops, or rubs, peripheral pulses 2+ bilaterally Chest: no skeletal deformity, lungs clear to auscultation bilaterally, equal breath sounds bilaterally Abdomen: soft, non-distended, non-tender to palpation, no hepatomegaly, no splenomegaly, normoactive bowel sounds Musculoskeletal: normal gait Extremities: no peripheral edema  ASSESSMENT AND PLAN:  1. Mass of subcutaneous tissue of back (Primary) *** - CT THORACIC SPINE WO CONTRAST; Future    Patient was given the opportunity to ask questions.  Patient verbalized understanding of the plan and was able to repeat key elements of the plan.    No orders of the defined types were placed in this encounter.    Requested Prescriptions    No prescriptions requested or ordered in this encounter    No follow-ups on file.  Pleas Carneal Tapia Daxon Kyne, PA-C      [1]  Allergies Allergen Reactions   Bee Venom Anaphylaxis

## 2024-01-10 ENCOUNTER — Ambulatory Visit: Payer: Self-pay

## 2024-01-10 NOTE — Telephone Encounter (Signed)
 FYI Only or Action Required?: Action required by provider: update on patient condition. Requesting hydrochlorothiazide  refill- out x1week  Patient was last seen in primary care on 01/09/2024 by Leavy Rode, [place], PA-C.  Called Nurse Triage reporting Hypertension.  Symptoms began a week ago.  Interventions attempted: Prescription medications: ramipril .  Symptoms are: gradually worsening.  Triage Disposition: See HCP Within 4 Hours (Or PCP Triage)  Patient/caregiver understands and will follow disposition?: No, wishes to speak with PCP  Copied from CRM [id number]. Topic: Clinical - Red Word Triage >> Jan 10, 2024  5:09 PM Shanda MATSU wrote: Red Word that prompted transfer to Nurse Triage: Patient is reporting a BP reading today of 168/111 and a BP reading on yesterday of 173/123, patient also reported some chest pain on yesterday but stated he took an aspirin  and it went away. Reason for Disposition  [1] Systolic BP >= 200 OR Diastolic >= 120 AND [2] having NO cardiac or neurologic symptoms  Answer Assessment - Initial Assessment Questions Pt with elevated BP for the last week. Yesterday had some mild CP and took ASA with resolution. Nothing since. Denies SOB, Dizzy, Fever, Headache, vision changes, or feeling bad.  Seen in office yesterday for unrelated issue and had elevated BP. Takes ramipril  and back in July was prescribed hydrochlorothiazide  by Dr Tanda during an Acute visit- refills again in September and recently ran out.  Denies gaining weight, swelling in ankles or feet or feeling SOB.  Ramipril  from Cardiology- took a double dose today with no benefit- doesn't think the med helps him at all.   Patient asking for hydrochlorothiazide  refill to help regulate his BP. Wanting to avoid the ED  1. BLOOD PRESSURE: What is your blood pressure? Did you take at least two measurements 5 minutes apart?     173/123, 168/111 2. ONSET: When did you take your blood pressure?     Last few  days  3. HOW: How did you take your blood pressure? (e.g., automatic home BP monitor, visiting nurse)     Automatic arm cuff.  4. HISTORY: Do you have a history of high blood pressure?     HTN 5. MEDICINES: Are you taking any medicines for blood pressure? Have you missed any doses recently?     Ramipril   6. OTHER SYMPTOMS: Do you have any symptoms? (e.g., blurred vision, chest pain, difficulty breathing, headache, weakness)     Chest pain yesterday- ASA stopped that Denies  Protocols used: Blood Pressure - High-A-AH

## 2024-01-11 ENCOUNTER — Encounter (HOSPITAL_COMMUNITY): Payer: Self-pay

## 2024-01-11 ENCOUNTER — Other Ambulatory Visit: Payer: Self-pay

## 2024-01-11 ENCOUNTER — Emergency Department (HOSPITAL_COMMUNITY)

## 2024-01-11 ENCOUNTER — Emergency Department (HOSPITAL_COMMUNITY)
Admission: EM | Admit: 2024-01-11 | Discharge: 2024-01-11 | Disposition: A | Discharge status: Home / Self Care | Attending: Emergency Medicine | Admitting: Emergency Medicine

## 2024-01-11 DIAGNOSIS — I10 Essential (primary) hypertension: Secondary | ICD-10-CM | POA: Diagnosis present

## 2024-01-11 DIAGNOSIS — E119 Type 2 diabetes mellitus without complications: Secondary | ICD-10-CM | POA: Diagnosis not present

## 2024-01-11 DIAGNOSIS — Z7982 Long term (current) use of aspirin: Secondary | ICD-10-CM | POA: Diagnosis not present

## 2024-01-11 LAB — CBC
HCT: 39.1 % (ref 39.0–52.0)
Hemoglobin: 12.5 g/dL — ABNORMAL LOW (ref 13.0–17.0)
MCH: 29.3 pg (ref 26.0–34.0)
MCHC: 32 g/dL (ref 30.0–36.0)
MCV: 91.6 fL (ref 80.0–100.0)
Platelets: 189 K/uL (ref 150–400)
RBC: 4.27 MIL/uL (ref 4.22–5.81)
RDW: 14.6 % (ref 11.5–15.5)
WBC: 6.5 K/uL (ref 4.0–10.5)
nRBC: 0 % (ref 0.0–0.2)

## 2024-01-11 LAB — BASIC METABOLIC PANEL WITH GFR
Anion gap: 8 (ref 5–15)
BUN: 16 mg/dL (ref 8–23)
CO2: 26 mmol/L (ref 22–32)
Calcium: 9.6 mg/dL (ref 8.9–10.3)
Chloride: 108 mmol/L (ref 98–111)
Creatinine, Ser: 1.6 mg/dL — ABNORMAL HIGH (ref 0.61–1.24)
GFR, Estimated: 47 mL/min — ABNORMAL LOW
Glucose, Bld: 95 mg/dL (ref 70–99)
Potassium: 4 mmol/L (ref 3.5–5.1)
Sodium: 141 mmol/L (ref 135–145)

## 2024-01-11 LAB — TROPONIN T, HIGH SENSITIVITY
Troponin T High Sensitivity: 16 ng/L (ref 0–19)
Troponin T High Sensitivity: 17 ng/L (ref 0–19)

## 2024-01-11 MED ORDER — AMLODIPINE BESYLATE 5 MG PO TABS
5.0000 mg | ORAL_TABLET | Freq: Once | ORAL | Status: AC
  Administered 2024-01-11: 5 mg via ORAL
  Filled 2024-01-11: qty 1

## 2024-01-11 MED ORDER — RAMIPRIL 10 MG PO CAPS: 10.0000 mg | ORAL_CAPSULE | Freq: Every day | ORAL | 3 refills | Status: AC

## 2024-01-11 MED ORDER — HYDROCHLOROTHIAZIDE 25 MG PO TABS: 25.0000 mg | ORAL_TABLET | Freq: Every day | ORAL | 0 refills | Status: AC

## 2024-01-11 NOTE — Discharge Instructions (Signed)
 Continue taking the ramipril  at 10 mg.  I have prescribed the hydrochlorothiazide  medication that you are previously taking.  Follow-up with your primary care doctor or cardiologist next week or so to recheck your blood pressure to make sure it is improving.

## 2024-01-11 NOTE — ED Provider Notes (Signed)
 " [place] EMERGENCY DEPARTMENT AT [facility] Provider Note   CSN: [id number] Arrival date & time: 01/11/24  1118     Patient presents with: Hypertension   Curtis Saunders is a 68 y.o. male.    Hypertension     Patient has a history of hypertension, stroke diabetes.  Patient presents ED with complaints of elevated blood pressure and episode of chest pain.  Patient states his blood pressure has been elevated since Thursday.  He does take blood pressure medications.  He ran out of one of his blood pressure medications about a month ago.  Patient states he tried to get a refill but was not given 1.  Patient states he had an episode of chest pain that lasted for couple of hours today.  It is an ache on the left side of chest.  He is not feeling nauseated.  No shortness of breath.  His symptoms all resolved at this point. Previous records reviewed.  Patient reported episodes of low blood pressure back in October of this year to the cardiologist office.  Patient was instructed to decrease his Altace  to 5 mg p.o. daily Prior to Admission medications  Medication Sig Start Date End Date Taking? Authorizing Provider  Accu-Chek FastClix Lancets MISC Apply topically. Patient not taking: Reported on 01/09/2024 03/07/22   [provider]  aspirin  EC 81 MG EC tablet Take 1 tablet (81 mg total) by mouth daily. Swallow whole. 02/28/20   Ghimire, Kuber, MD  blood glucose meter kit and supplies Dispense based on patient and insurance preference. Use up to four times daily as directed. (FOR ICD-10 E10.9, E11.9). 03/02/22   Rai, Ripudeep K, MD  Blood Glucose Monitoring Suppl (ACCU-CHEK AVIVA PLUS) w/Device KIT 1 each by Does not apply route 4 (four) times daily -  before meals and at bedtime. 03/07/22   Jaycee Greig PARAS, NP  ciclopirox  (PENLAC ) 8 % solution Apply topically at bedtime. Apply over nail and surrounding skin. Apply daily over previous coat. After seven (7) days, may remove  with alcohol and continue cycle. Patient not taking: Reported on 01/09/2024 11/15/23 02/13/24  McCaughan, Dia D, DPM  finasteride (PROSCAR) 5 MG tablet Take 5 mg by mouth daily. Patient not taking: Reported on 01/09/2024 11/24/22   [provider]  glucose blood (ACCU-CHEK GUIDE) test strip Use as instructed 03/07/22   Jaycee Greig PARAS, NP  hydrochlorothiazide  (HYDRODIURIL ) 25 MG tablet Take 1 tablet (25 mg total) by mouth daily. 01/11/24   Randol Simmonds, MD  Lancets Misc. (ACCU-CHEK FASTCLIX LANCET) KIT 1 each by Does not apply route 4 (four) times daily -  before meals and at bedtime. 03/07/22   Jaycee Greig PARAS, NP  methocarbamol  (ROBAXIN ) 500 MG tablet Take 2 tablets (1,000 mg total) by mouth every 6 (six) hours as needed for muscle spasms. Patient not taking: Reported on 01/09/2024 09/08/23   Gennaro Bouchard L, DO  predniSONE  (DELTASONE ) 5 MG tablet Take 5 mg by mouth every morning. Patient not taking: Reported on 01/09/2024 05/01/23   [provider]  ramipril  (ALTACE ) 10 MG capsule Take 1 capsule (10 mg total) by mouth daily. 01/11/24 04/10/24  Randol Simmonds, MD  rosuvastatin  (CRESTOR ) 20 MG tablet Take 1 tablet by mouth once daily 10/24/23   Jaycee Greig PARAS, NP  Semaglutide ,0.25 or 0.5MG /DOS, (OZEMPIC , 0.25 OR 0.5 MG/DOSE,) 2 MG/3ML SOPN INJECT 1/2 (ONE-HALF) MG  ONCE A WEEK 01/02/24   Shamleffer, Donell Cardinal, MD  tamsulosin  (FLOMAX ) 0.4 MG CAPS capsule  Take 1 capsule (0.4 mg total) by mouth daily. 03/02/22   Rai, Nydia POUR, MD    Allergies: Bee venom    Review of Systems  Updated Vital Signs BP (!) 180/106 (BP Location: Right Arm)   Pulse 64   Temp 97.6 F (36.4 C) (Oral)   Resp 19   Ht 1.803 m (5' 11)   Wt 89.8 kg   SpO2 100%   BMI 27.62 kg/m   Physical Exam Vitals and nursing note reviewed.  Constitutional:      General: He is not in acute distress.    Appearance: He is well-developed.  HENT:     Head: Normocephalic and atraumatic.     Right Ear: External ear normal.      Left Ear: External ear normal.  Eyes:     General: No scleral icterus.       Right eye: No discharge.        Left eye: No discharge.     Conjunctiva/sclera: Conjunctivae normal.  Neck:     Trachea: No tracheal deviation.  Cardiovascular:     Rate and Rhythm: Normal rate and regular rhythm.  Pulmonary:     Effort: Pulmonary effort is normal. No respiratory distress.     Breath sounds: Normal breath sounds. No stridor. No wheezing or rales.  Abdominal:     General: Bowel sounds are normal. There is no distension.     Palpations: Abdomen is soft.     Tenderness: There is no abdominal tenderness. There is no guarding or rebound.  Musculoskeletal:        General: No tenderness or deformity.     Cervical back: Neck supple.  Skin:    General: Skin is warm and dry.     Findings: No rash.  Neurological:     General: No focal deficit present.     Mental Status: He is alert.     Cranial Nerves: No cranial nerve deficit, dysarthria or facial asymmetry.     Sensory: No sensory deficit.     Motor: No abnormal muscle tone or seizure activity.     Coordination: Coordination normal.  Psychiatric:        Mood and Affect: Mood normal.     (all labs ordered are listed, but only abnormal results are displayed) Labs Reviewed  BASIC METABOLIC PANEL WITH GFR - Abnormal; Notable for the following components:      Result Value   Creatinine, Ser 1.60 (*)    GFR, Estimated 47 (*)    All other components within normal limits  CBC - Abnormal; Notable for the following components:   Hemoglobin 12.5 (*)    All other components within normal limits  TROPONIN T, HIGH SENSITIVITY  TROPONIN T, HIGH SENSITIVITY    EKG: EKG Interpretation Date/Time:  Friday January 11 2024 11:26:22 EST Ventricular Rate:  76 PR Interval:  174 QRS Duration:  99 QT Interval:  370 QTC Calculation: 416 R Axis:   86  Text Interpretation: Sinus rhythm Probable left atrial enlargement Consider right ventricular  hypertrophy Nonspecific T abnrm, anterolateral leads Minimal ST elevation, lateral leads No significant change since last tracing Confirmed by Randol Simmonds [phone]) on 01/11/2024 1:12:27 PM  Radiology: [facility] Chest 2 View Result Date: 01/11/2024 EXAM: 2 VIEW(S) XRAY OF THE CHEST 01/11/2024 11:43:00 AM COMPARISON: 02/01/2023 CLINICAL HISTORY: chest pain FINDINGS: LUNGS AND PLEURA: No focal pulmonary opacity. No pleural effusion. No pneumothorax. HEART AND MEDIASTINUM: No acute abnormality of the cardiac and mediastinal silhouettes. BONES  AND SOFT TISSUES: No acute osseous abnormality. IMPRESSION: 1. No acute cardiopulmonary abnormality. Electronically signed by: Waddell Calk MD 01/11/2024 01:47 PM EST RP Workstation: [id number]     Procedures   Medications Ordered in the ED  amLODipine  (NORVASC ) tablet 5 mg (5 mg Oral Given 01/11/24 1355)  amLODipine  (NORVASC ) tablet 5 mg (5 mg Oral Given 01/11/24 1626)    Clinical Course as of 01/11/24 1629  Fri Jan 11, 2024  1312 CBC and metabolic panel normal.  Initial troponin normal [JK]    Clinical Course User Index [JK] Randol Simmonds, MD                                 Medical Decision Making Problems Addressed: Hypertension, unspecified type: acute illness or injury that poses a threat to life or bodily functions  Amount and/or Complexity of Data Reviewed Labs: ordered. Decision-making details documented in ED Course. Radiology: ordered and independent interpretation performed.  Risk Prescription drug management.   Patient presented to the ED for evaluation of elevated blood pressure.  Patient also had chest discomfort earlier today.  Patient does have moderate risk heart score.  Serial troponins however are normal.  Low suspicion for ACS.  Patient appears comfortable in the ED.  He has normal pulses.  No suspicion for acute aortic syndrome.  Patient's blood pressure is elevated.  Reviewed his outpatient medications.  Notes indicate he is on  5 mg of ramipril  daily.  Patient however states he is taking 10.  He used to be on hydrochlorothiazide  and his blood pressure was under better control at that time.  Patient states he ran out of that medication.  Patient was given oral medications for his blood pressure here in the ED.  Will give him a refill of his hydrochlorothiazide  and have him continue the ramipril  at 10 mg.  Discussed outpatient follow-up with his cardiologist or his primary care doctor to have his blood pressure rechecked.       Final diagnoses:  Hypertension, unspecified type    ED Discharge Orders          Ordered    hydrochlorothiazide  (HYDRODIURIL ) 25 MG tablet  Daily        01/11/24 1624    ramipril  (ALTACE ) 10 MG capsule  Daily        01/11/24 1624               Randol Simmonds, MD 01/11/24 1629  "

## 2024-01-11 NOTE — ED Triage Notes (Signed)
 Patient said his blood pressure has been high at home since Thursday in the 180s. Had chest pain when it got this high but the chest pain now went away. No headache.

## 2024-02-04 ENCOUNTER — Ambulatory Visit: Payer: Self-pay | Admitting: Family

## 2024-02-08 ENCOUNTER — Other Ambulatory Visit: Payer: Self-pay | Admitting: Family

## 2024-02-08 DIAGNOSIS — E785 Hyperlipidemia, unspecified: Secondary | ICD-10-CM

## 2024-02-08 NOTE — Telephone Encounter (Signed)
 Requested Prescriptions  Pending Prescriptions Disp Refills   rosuvastatin  (CRESTOR ) 20 MG tablet [Pharmacy Med Name: Rosuvastatin  Calcium  20 MG Oral Tablet] 90 tablet 0    Sig: Take 1 tablet by mouth once daily     Cardiovascular:  Antilipid - Statins 2 Failed - 02/08/2024  3:05 PM      Failed - Cr in normal range and within 360 days    Creat  Date Value Ref Range Status  05/28/2023 1.59 (H) 0.70 - 1.35 mg/dL Final   Creatinine, Ser  Date Value Ref Range Status  01/11/2024 1.60 (H) 0.61 - 1.24 mg/dL Final   Creatinine, POC  Date Value Ref Range Status  08/24/2022 245.9 mg/dL Final   Creatinine, Urine  Date Value Ref Range Status  05/28/2023 190 20 - 320 mg/dL Final         Failed - Lipid Panel in normal range within the last 12 months    Cholesterol, Total  Date Value Ref Range Status  06/23/2021 121 100 - 199 mg/dL Final   Cholesterol  Date Value Ref Range Status  05/28/2023 131 <200 mg/dL Final   LDL Cholesterol (Calc)  Date Value Ref Range Status  05/28/2023 56 mg/dL (calc) Final    Comment:    Reference range: <100 . Desirable range <100 mg/dL for primary prevention;   <70 mg/dL for patients with CHD or diabetic patients  with > or = 2 CHD risk factors. SABRA LDL-C is now calculated using the Martin-Hopkins  calculation, which is a validated novel method providing  better accuracy than the Friedewald equation in the  estimation of LDL-C.  Gladis APPLETHWAITE et al. SANDREA. 7986;689(80): 2061-2068  (http://education.[employer].com/faq/FAQ164)    HDL  Date Value Ref Range Status  05/28/2023 63 > OR = 40 mg/dL Final  93/98/7976 45 >60 mg/dL Final   Triglycerides  Date Value Ref Range Status  05/28/2023 41 <150 mg/dL Final         Passed - Patient is not pregnant      Passed - Valid encounter within last 12 months    Recent Outpatient Visits           1 month ago Mass of subcutaneous tissue of back   [facility] Leavy Lucas Jacolyn Joaquin, PA-C   3 months ago Chronic right shoulder pain   [place] [facility], Amy J, NP   6 months ago Chronic right shoulder pain   [place] [facility], Amy J, NP   6 months ago Uncontrolled hypertension   [place] [facility], MD   7 months ago Irritation of left ear   [facility] [place] [facility] [place], PA-C

## 2024-02-12 ENCOUNTER — Telehealth: Payer: Self-pay | Admitting: Family

## 2024-02-12 ENCOUNTER — Ambulatory Visit: Admitting: Family

## 2024-02-12 VITALS — BP 172/113 | HR 64 | Ht <= 58 in | Wt 205.0 lb

## 2024-02-12 DIAGNOSIS — E1165 Type 2 diabetes mellitus with hyperglycemia: Secondary | ICD-10-CM

## 2024-02-12 DIAGNOSIS — Z742 Need for assistance at home and no other household member able to render care: Secondary | ICD-10-CM

## 2024-02-12 DIAGNOSIS — D179 Benign lipomatous neoplasm, unspecified: Secondary | ICD-10-CM

## 2024-02-12 DIAGNOSIS — I10 Essential (primary) hypertension: Secondary | ICD-10-CM

## 2024-02-12 MED ORDER — ACCU-CHEK AVIVA PLUS W/DEVICE KIT: 1.0000 | PACK | Freq: Three times a day (TID) | 0 refills | Status: AC

## 2024-02-12 MED ORDER — ACCU-CHEK FASTCLIX LANCET KIT: 1.0000 | PACK | Freq: Three times a day (TID) | 11 refills | Status: AC

## 2024-02-12 MED ORDER — ACCU-CHEK FASTCLIX LANCETS MISC: 1.0000 | Freq: Three times a day (TID) | 11 refills | Status: AC

## 2024-02-12 NOTE — Progress Notes (Signed)
 "   Patient ID: Aariv Medlock, male    DOB: 03/02/1955  MRN: [id number]  CC: Follow-Up  Subjective: Curtis Saunders is a 69 y.o. male who presents for follow-up. He is accompanied by his roommate.   His concerns today include:  - States 3 weeks ago home health nurse told him to quit taking Hydrochlorothiazide  due to low blood pressure (systolic 108). He is doing well on Ramipril , no issues/concerns. States having episodes of passing out. States when this happens he lays down for an hour and then feels fine. Patient confirms he does not report to the Emergency Department/call 911 for immediate medical evaluation during those episodes. He is established with Cardiology and plans to schedule an appointment soon. He does not complain of red flag symptoms such as but not limited to chest pain, shortness of breath, worst headache of life, nausea/vomiting.  - Established with Endocrinology for management of diabetes and related chronic conditions. Denies red flag symptoms. States he needs diabetic testing supplies.  - States mass of right back persisting. Denies red flag symptoms.  - States he needs home health due to his chronic conditions.   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   Diabetes mellitus (HCC) 06/16/2022   Low serum cortisol level 06/16/2022   Benign essential HTN 03/11/2022   Uncontrolled type 2 diabetes mellitus with hyperglycemia, with long-term current use of insulin  (HCC) 03/11/2022   Acute urinary retention 03/11/2022   Obesity (BMI 30-39.9) 03/11/2022   Acute retention of urine 03/11/2022   Acute kidney injury superimposed on chronic kidney disease 03/01/2022   New onset type 2 diabetes mellitus (HCC) 02/28/2022   BPH (benign prostatic hyperplasia) 02/28/2022   Pain in joint of right shoulder 01/09/2022   Partial thickness rotator cuff tear 01/09/2022   Osteoarthritis of right knee 11/08/2021   Pain in  joint of right knee 11/08/2021   Low back pain 07/14/2021   Pain in joint of left shoulder 08/12/2020   Stage 3 chronic kidney disease (HCC) 04/12/2020   CVA (cerebral vascular accident) (HCC) 02/27/2020   Ascending aortic aneurysm 02/11/2020   Double pterygium, 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   Essential hypertension 03/06/2018   LVH (left ventricular hypertrophy) 02/23/2018   HTN (hypertension), malignant 02/20/2018   Chest pain 02/20/2018     Medications Ordered Prior to Encounter[1]  Allergies[2]  Social History   Socioeconomic History   Marital status: Significant Other    Spouse name: Not on file   Number of children: Not on file   Years of education: Not on file   Highest education level: Not on file  Occupational History   Occupation: retired  Tobacco Use   Smoking status: Never    Passive exposure: Never   Smokeless tobacco: Never  Vaping Use   Vaping status: Never Used  Substance and Sexual Activity   Alcohol use: Not Currently   Drug use: No   Sexual activity: Yes  Other Topics Concern   Not on file  Social History Narrative   ** Merged History Encounter **       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     Social Drivers of Health   Tobacco Use: Low Risk (01/11/2024)   Patient History    Smoking Tobacco Use: Never    Smokeless Tobacco Use: Never    Passive Exposure: Never  Financial Resource Strain: Low Risk (08/08/2023)   Overall Financial Resource Strain (CARDIA)    Difficulty of  Paying Living Expenses: Not hard at all  Food Insecurity: No Food Insecurity (08/08/2023)   Epic    Worried About Programme Researcher, Broadcasting/film/video in the Last Year: Never true    Ran Out of Food in the Last Year: Never true  Transportation Needs: No Transportation Needs (08/08/2023)   Epic    Lack of Transportation (Medical): No    Lack of Transportation (Non-Medical): No  Physical Activity: Insufficiently Active (08/08/2023)   Exercise Vital Sign    Days of Exercise per Week: 3 days    Minutes of  Exercise per Session: 30 min  Stress: No Stress Concern Present (08/08/2023)   [employer] of Occupational Health - Occupational Stress Questionnaire    Feeling of Stress: Not at all  Social Connections: Socially Isolated (08/08/2023)   Social Connection and Isolation Panel    Frequency of Communication with Friends and Family: Twice a week    Frequency of Social Gatherings with Friends and Family: Once a week    Attends Religious Services: Never    Database Administrator or Organizations: No    Attends Banker Meetings: Never    Marital Status: Never married  Intimate Partner Violence: Not At Risk (08/08/2023)   Epic    Fear of Current or Ex-Partner: No    Emotionally Abused: No    Physically Abused: No    Sexually Abused: No  Depression (PHQ2-9): Low Risk (01/09/2024)   Depression (PHQ2-9)    PHQ-2 Score: 0  Alcohol Screen: Low Risk (08/08/2023)   Alcohol Screen    Last Alcohol Screening Score (AUDIT): 0  Housing: Unknown (08/08/2023)   Epic    Unable to Pay for Housing in the Last Year: No    Number of Times Moved in the Last Year: Not on file    Homeless in the Last Year: No  Utilities: Not At Risk (08/08/2023)   Epic    Threatened with loss of utilities: No  Health Literacy: Adequate Health Literacy (08/08/2023)   B1300 Health Literacy    Frequency of need for help with medical instructions: Never    Family History  Problem Relation Age of Onset   Heart attack Mother 36   COPD Mother    Emphysema Mother    Diabetes Father    Diabetes Sister    Hypertension Sister    Sleep apnea Neg Hx     Past Surgical History:  Procedure Laterality Date   APPENDECTOMY     EYE SURGERY     HAND SURGERY     KNEE SURGERY     x3, x1 R   LIPOMA EXCISION Left 10/13/2019   Procedure: SHOULDER EXCISION LIPOMA AND ROTATOR CUFF REPAIR;  Surgeon: Dozier Soulier, MD;  Location: [facility];  Service: Orthopedics;  Laterality: Left;   TONSILLECTOMY       ROS: Review of Systems Negative except as stated above  PHYSICAL EXAM: BP (!) 172/113   Pulse 64   Ht (!) 6 (0.152 m)   Wt 205 lb (93 kg)   SpO2 97%   BMI 4003.63 kg/m   Physical Exam HENT:     Head: Normocephalic and atraumatic.     Nose: Nose normal.     Mouth/Throat:     Mouth: Mucous membranes are moist.     Pharynx: Oropharynx is clear.  Eyes:     Extraocular Movements: Extraocular movements intact.     Conjunctiva/sclera: Conjunctivae normal.     Pupils: Pupils are equal,  round, and reactive to light.  Cardiovascular:     Rate and Rhythm: Normal rate and regular rhythm.     Pulses: Normal pulses.     Heart sounds: Normal heart sounds.  Pulmonary:     Effort: Pulmonary effort is normal.     Breath sounds: Normal breath sounds.  Musculoskeletal:        General: Normal range of motion.     Cervical back: Normal range of motion and neck supple.  Skin:    General: Skin is warm and dry.     Comments: Soft movable mass right upper back, no drainage.   Neurological:     General: No focal deficit present.     Mental Status: He is alert and oriented to person, place, and time.  Psychiatric:        Mood and Affect: Mood normal.        Behavior: Behavior normal.     ASSESSMENT AND PLAN: 1. Primary hypertension (Primary) - Blood pressure not at goal during today's visit. Patient asymptomatic without chest pressure, chest pain, palpitations, shortness of breath, worst headache of life, and any additional red flag symptoms. - Continue Ramipril  as prescribed. No refills needed as of present.  - Resume Hydrochlorothiazide  as prescribed. No refills needed as of present.  - Counseled on blood pressure goal of less than 130/80, low-sodium, DASH diet, medication compliance, and 150 minutes of moderate intensity exercise per week as tolerated. Counseled on medication adherence and adverse effects. - Keep all scheduled appointments with established Cardiology. Also, referral  to the same if needed.  - Follow-up with primary provider as scheduled.  - Ambulatory referral to Cardiology  2. Type 2 diabetes mellitus with hyperglycemia, unspecified whether long term insulin  use (HCC) - Continue present management as managed by Endocrinology.  - Diabetic testing supplies prescribed.  - Discussed the importance of healthy eating habits, low-carbohydrate diet, low-sugar diet, regular aerobic exercise (at least 150 minutes a week as tolerated) and medication compliance to achieve or maintain control of diabetes. Counseled on medication adherence/adverse effects.  - Keep all scheduled appointments with established Endocrinology.  - Follow-up with primary provider as scheduled.  - Accu-Chek FastClix Lancets MISC; 1 each by Other route 4 (four) times daily -  before meals and at bedtime.  Dispense: 102 each; Refill: 11 - Blood Glucose Monitoring Suppl (ACCU-CHEK AVIVA PLUS) w/Device KIT; 1 each by Does not apply route 4 (four) times daily -  before meals and at bedtime.  Dispense: 1 kit; Refill: 0 - Lancets Misc. (ACCU-CHEK FASTCLIX LANCET) KIT; 1 each by Does not apply route 4 (four) times daily -  before meals and at bedtime.  Dispense: 1 kit; Refill: 11  3. Lipoma, unspecified site - Referral to Dermatology for evaluation/management.  - Ambulatory referral to Dermatology  4. Need for home health care - Message sent to RN case manager for patient assistance.   Patient was given the opportunity to ask questions.  Patient verbalized understanding of the plan and was able to repeat key elements of the plan. Patient was given clear instructions to go to Emergency Department or return to medical center if symptoms don't improve, worsen, or new problems develop.The patient verbalized understanding.   Orders Placed This Encounter  Procedures   Ambulatory referral to Cardiology   Ambulatory referral to Dermatology     Requested Prescriptions   Signed Prescriptions Disp  Refills   Accu-Chek FastClix Lancets MISC 102 each 11    Sig: 1 each  by Other route 4 (four) times daily -  before meals and at bedtime.   Blood Glucose Monitoring Suppl (ACCU-CHEK AVIVA PLUS) w/Device KIT 1 kit 0    Sig: 1 each by Does not apply route 4 (four) times daily -  before meals and at bedtime.   Lancets Misc. (ACCU-CHEK FASTCLIX LANCET) KIT 1 kit 11    Sig: 1 each by Does not apply route 4 (four) times daily -  before meals and at bedtime.    Return for Follow-up as needed.  Greig JINNY Chute, NP      [1]  Current Outpatient Medications on File Prior to Visit  Medication Sig Dispense Refill   aspirin  EC 81 MG EC tablet Take 1 tablet (81 mg total) by mouth daily. Swallow whole. 30 tablet 11   blood glucose meter kit and supplies Dispense based on patient and insurance preference. Use up to four times daily as directed. (FOR ICD-10 E10.9, E11.9). 1 each 0   finasteride (PROSCAR) 5 MG tablet Take 5 mg by mouth daily.     glucose blood (ACCU-CHEK GUIDE) test strip Use as instructed 100 each 2   hydrochlorothiazide  (HYDRODIURIL ) 25 MG tablet Take 1 tablet (25 mg total) by mouth daily. 90 tablet 0   ramipril  (ALTACE ) 10 MG capsule Take 1 capsule (10 mg total) by mouth daily. 90 capsule 3   rosuvastatin  (CRESTOR ) 20 MG tablet Take 1 tablet by mouth once daily 90 tablet 0   Semaglutide ,0.25 or 0.5MG /DOS, (OZEMPIC , 0.25 OR 0.5 MG/DOSE,) 2 MG/3ML SOPN INJECT 1/2 (ONE-HALF) MG  ONCE A WEEK 9 mL 3   ciclopirox  (PENLAC ) 8 % solution Apply topically at bedtime. Apply over nail and surrounding skin. Apply daily over previous coat. After seven (7) days, may remove with alcohol and continue cycle. (Patient not taking: Reported on 01/09/2024) 6.6 mL 0   methocarbamol  (ROBAXIN ) 500 MG tablet Take 2 tablets (1,000 mg total) by mouth every 6 (six) hours as needed for muscle spasms. (Patient not taking: Reported on 01/09/2024) 56 tablet 0   predniSONE  (DELTASONE ) 5 MG tablet Take 5 mg by mouth every  morning. (Patient not taking: Reported on 01/09/2024)     tamsulosin  (FLOMAX ) 0.4 MG CAPS capsule Take 1 capsule (0.4 mg total) by mouth daily. 30 capsule 3   No current facility-administered medications on file prior to visit.  [2]  Allergies Allergen Reactions   Bee Venom Anaphylaxis   "

## 2024-02-13 NOTE — Telephone Encounter (Signed)
 I agree. Thank you.

## 2024-02-13 NOTE — Telephone Encounter (Signed)
 Complete

## 2024-02-13 NOTE — Telephone Encounter (Signed)
 Signed PCS request efaxed to [place] [employer]

## 2024-02-13 NOTE — Telephone Encounter (Signed)
 Noted! Thank you

## 2024-02-15 ENCOUNTER — Encounter: Payer: Self-pay | Admitting: Emergency Medicine

## 2024-02-15 ENCOUNTER — Ambulatory Visit: Attending: Emergency Medicine | Admitting: Emergency Medicine

## 2024-02-15 ENCOUNTER — Ambulatory Visit: Payer: Self-pay | Admitting: Emergency Medicine

## 2024-02-15 VITALS — BP 122/72 | HR 68 | Resp 17 | Ht 72.0 in | Wt 206.0 lb

## 2024-02-15 DIAGNOSIS — Z79899 Other long term (current) drug therapy: Secondary | ICD-10-CM

## 2024-02-15 DIAGNOSIS — I7121 Aneurysm of the ascending aorta, without rupture: Secondary | ICD-10-CM

## 2024-02-15 DIAGNOSIS — I517 Cardiomegaly: Secondary | ICD-10-CM

## 2024-02-15 DIAGNOSIS — I10 Essential (primary) hypertension: Secondary | ICD-10-CM | POA: Diagnosis not present

## 2024-02-15 LAB — BASIC METABOLIC PANEL WITH GFR
BUN/Creatinine Ratio: 10 (ref 10–24)
BUN: 19 mg/dL (ref 8–27)
CO2: 23 mmol/L (ref 20–29)
Calcium: 9.8 mg/dL (ref 8.6–10.2)
Chloride: 105 mmol/L (ref 96–106)
Creatinine, Ser: 1.91 mg/dL — ABNORMAL HIGH (ref 0.76–1.27)
Glucose: 88 mg/dL (ref 70–99)
Potassium: 4 mmol/L (ref 3.5–5.2)
Sodium: 144 mmol/L (ref 134–144)
eGFR: 38 mL/min/1.73 — ABNORMAL LOW

## 2024-02-15 NOTE — Progress Notes (Addendum)
 " Cardiology Office Note:    Date:  02/15/2024  ID:  Curtis Saunders, DOB 06-03-1955, MRN [id number] PCP: Curtis Greig PARAS, NP  [facility] Providers Cardiologist:  Lynwood Schilling, MD       Patient Profile:       Chief Complaint: Acute visit for hypertension History of Present Illness:  Curtis Saunders is a 69 y.o. male with visit-pertinent history of hypertension, LVH, aortic aneurysm, CKD, CVA  Coronary CTA 05/2018 show coronary calcium  score of 0 with CT over read showing stable 4.0 cm ascending thoracic aorta.    In February 2022 he was hospitalized for acute CVA.  MRI showed posterior limb interval capsule acute infarct. Echocardiogram 02/2020 showed severe concentric cardiomyopathy, LVEF 65 to 70%, severe LVH, grade 1 DD, mild mitral valve regurgitation, mild dilation of ascending aorta measuring 41 mm.  He was treated with ASA and Plavix .  Cardiac MRI 07/2020 showed normal LV and RV function, borderline LV hypertrophy, no evidence of aortic aneurysm, study not suggestive of cardiac amyloidosis.  He was last seen in clinic by Dr. Schilling on 08/27/2023.  He was doing well without acute concerns.  No changes are made.  He underwent echocardiogram on 10/04/2023 showing mild concentric LVH which improved from prior study, LVEF 60 to 65%, no RWMA, RV function and size normal, normal PASP, no valvular abnormalities.  Recently seen by PCP on 02/12/2024.  Blood pressure was noted to be elevated at 172/113.  He reported that he had stopped his hydrochlorothiazide  3 weeks prior due to lightheadedness, dizziness, near syncope.  He was ultimately restarted on hydrochlorothiazide  25 mg daily and was to follow-up with cardiology   Discussed the use of AI scribe software for clinical note transcription with the patient, who gave verbal consent to proceed.  [name] is a 69 year old male with hypertension who presents with concerns about  dizziness and blood pressure management. He was referred by his primary care provider due to concerns about his blood pressure management.  He has intermittent dizziness with near syncope, with episodes over the past month including one yesterday and others two weeks ago and last month. Episodes begin suddenly while sitting, standing, or active. He does not routinely check blood pressure during these events.  He was told by a nurse to stop hydrochlorothiazide  3 weeks ago, which was later restarted by his nurse practitioner. He now takes hydrochlorothiazide  25 mg and ramipril  10 mg in the morning with other medications, usually on an empty stomach. He feels he is not well hydrated despite drinking water  and coffee.  A family member reports he often skips meals and usually only eats dinner. He drinks sweetened coffee in the morning and frequently eats fried foods.  He denies chest pains, dyspnea, orthopnea, PND, syncope, melena, hematochezia   Review of systems:  Please see the history of present illness. All other systems are reviewed and otherwise negative.      Studies Reviewed:        Echocardiogram 10/04/2023  1. Mild concentric LVH up to 1.3 cm. Improved from prior study. Left  ventricular ejection fraction, by estimation, is 60 to 65%. The left  ventricle has normal function. The left ventricle has no regional wall  motion abnormalities. There is mild  concentric left ventricular hypertrophy. Left ventricular diastolic  function could not be evaluated.   2. Right ventricular systolic function is normal. The right ventricular  size is normal. There is normal pulmonary  artery systolic pressure. The  estimated right ventricular systolic pressure is 27.2 mmHg.   3. The mitral valve is grossly normal. No evidence of mitral valve  regurgitation. No evidence of mitral stenosis.   4. The aortic valve is tricuspid. Aortic valve regurgitation is not  visualized. No aortic stenosis is  present.   5. The inferior vena cava is normal in size with greater than 50%  respiratory variability, suggesting right atrial pressure of 3 mmHg.   Cardiac MRI 07/30/2020 Normal LV and RV function   Borderline LV hypertrophy (mild regional hypertrophy with normal myocardial mass index).   No evidence of aortic aneurysm.   Study is not suggestive of cardiac amyloidosis (able to null myocardium, no LGE, normal parametric mapping)  Echocardiogram limited 02/28/2020  1. There is severe concentric cardiomyopathy, consider infiltrative  cardiomyopaties, such as cardiac amyloidosis or Anderson-Fabry disease.   2. Left ventricular ejection fraction, by estimation, is 65 to 70%. The  left ventricle has normal function. There is severe concentric left  ventricular hypertrophy. Left ventricular diastolic parameters are  consistent with Grade I diastolic dysfunction  (impaired relaxation).   3. Left atrial size was mildly dilated.   4. Mild mitral valve regurgitation.   5. Mild aortic valve sclerosis is present, with no evidence of aortic  valve stenosis.   6. There is mild dilatation of the ascending aorta, measuring 41 mm.   Coronary CTA 05/31/2018 1. Coronary artery calcium  score 0 Agatston units, suggesting low risk for future cardiac events.   2.  No significant coronary disease visualized on CTA. Risk Assessment/Calculations:           Physical Exam:   VS:  BP 122/72 (BP Location: Left Arm, Patient Position: Sitting, Cuff Size: Normal)   Pulse 68   Resp 17   Ht 6' (1.829 m)   Wt 206 lb (93.4 kg)   SpO2 97%   BMI 27.94 kg/m    Wt Readings from Last 3 Encounters:  02/15/24 206 lb (93.4 kg)  02/12/24 205 lb (93 kg)  01/11/24 198 lb (89.8 kg)    GEN: Well nourished, well developed in no acute distress NECK: No JVD; No carotid bruits CARDIAC: RRR, no murmurs, rubs, gallops RESPIRATORY:  Clear to auscultation without rales, wheezing or rhonchi  ABDOMEN: Soft, non-tender,  non-distended EXTREMITIES:  No edema; No acute deformity       Assessment and Plan:  Hypertension 151/93 laying 125/86 sitting 128/89 standing - Was taken off of hydrochlorothiazide  approximately 3 weeks ago due to lightheadedness, dizziness, and near syncope then restarted by his GP this week due to uncontrolled HTN - His orthostatics in office were positive today for a 20 point drop from lying to sitting - He does report intermittent episodes of lightheadedness, dizziness, and near syncope usually during the a.m. hours that improves throughout the day.  He drinks several cups of coffee in the morning and is not well-hydrated.  Currently taking hydrochlorothiazide  and ramipril  in the mornings - Plan will be to move his ramipril  to p.m. dosing.  I will have him monitor blood pressure at home and if he continues to experience symptoms we will have him half his hydrochlorothiazide  at home but for now we will continue full dose - Continue hydrochlorothiazide  25 mg in the a.m. - Continue ramipril  10 mg in the p.m. (moving to p.m. dosing)  Left ventricular hypertrophy Cardiac MRI 07/2020 showed borderline LV hypertrophy Echocardiogram 09/2023 showed mild concentric LVH - Maintain adequate blood pressure control -  No further interventions warranted at this time   Aortic aneurysm Cardiac MRI 07/2020 showed no evidence of aortic aneurysm Echocardiogram 09/2023 showed no evidence of aortic dilation - No further interventions warranted at this time  CKD Creatinine 1.60 and GFR 47 on 12/2023 - Stable.  Avoid nephrotoxic drugs - Maintain adequate hydration      Dispo:  Return in about 3 months (around 05/15/2024).  Signed, Lum LITTIE Louis, NP  "

## 2024-02-15 NOTE — Patient Instructions (Addendum)
 Medication Instructions:  TAKE RAMIPRIL  10 MG IN THE EVENING. TAKE HYDROCHLOROTHIAZIDE  25 MG IN THE MORNING.  TAKE CRESTOR  20 MG THE THE EVENING.  Lab Work: BMET TO BE DONE TODAY.  Testing/Procedures: NONE  Follow-Up: At [facility], you and your health needs are our priority.  As part of our continuing mission to provide you with exceptional heart care, our providers are all part of one team.  This team includes your primary Cardiologist (physician) and Advanced Practice Providers or APPs (Physician Assistants and Nurse Practitioners) who all work together to provide you with the care you need, when you need it.  Your next appointment:   3 MONTHS  Provider:   MADISON FOUNTAIN, DNP   Other Instructions:

## 2024-05-12 ENCOUNTER — Ambulatory Visit: Admitting: Emergency Medicine
